# Patient Record
Sex: Female | Born: 1965
Health system: Southern US, Community
[De-identification: ages and names within clinical notes are randomized; demographics above are authoritative.]

## PROBLEM LIST (undated history)

## (undated) DIAGNOSIS — M2550 Pain in unspecified joint: Secondary | ICD-10-CM

## (undated) DIAGNOSIS — G2581 Restless legs syndrome: Secondary | ICD-10-CM

## (undated) DIAGNOSIS — E559 Vitamin D deficiency, unspecified: Secondary | ICD-10-CM

## (undated) DIAGNOSIS — G43909 Migraine, unspecified, not intractable, without status migrainosus: Secondary | ICD-10-CM

## (undated) DIAGNOSIS — Z973 Presence of spectacles and contact lenses: Secondary | ICD-10-CM

## (undated) DIAGNOSIS — E2839 Other primary ovarian failure: Secondary | ICD-10-CM

## (undated) DIAGNOSIS — E538 Deficiency of other specified B group vitamins: Secondary | ICD-10-CM

## (undated) DIAGNOSIS — M48061 Spinal stenosis, lumbar region without neurogenic claudication: Secondary | ICD-10-CM

## (undated) DIAGNOSIS — M5416 Radiculopathy, lumbar region: Secondary | ICD-10-CM

## (undated) DIAGNOSIS — I499 Cardiac arrhythmia, unspecified: Secondary | ICD-10-CM

## (undated) DIAGNOSIS — Z9889 Other specified postprocedural states: Secondary | ICD-10-CM

## (undated) DIAGNOSIS — Z889 Allergy status to unspecified drugs, medicaments and biological substances status: Secondary | ICD-10-CM

## (undated) DIAGNOSIS — A498 Other bacterial infections of unspecified site: Secondary | ICD-10-CM

## (undated) DIAGNOSIS — I4891 Unspecified atrial fibrillation: Secondary | ICD-10-CM

## (undated) DIAGNOSIS — E876 Hypokalemia: Secondary | ICD-10-CM

## (undated) DIAGNOSIS — I10 Essential (primary) hypertension: Secondary | ICD-10-CM

## (undated) DIAGNOSIS — B019 Varicella without complication: Secondary | ICD-10-CM

## (undated) DIAGNOSIS — Z981 Arthrodesis status: Secondary | ICD-10-CM

## (undated) DIAGNOSIS — T8859XA Other complications of anesthesia, initial encounter: Secondary | ICD-10-CM

## (undated) DIAGNOSIS — E039 Hypothyroidism, unspecified: Secondary | ICD-10-CM

## (undated) DIAGNOSIS — E669 Obesity, unspecified: Secondary | ICD-10-CM

## (undated) DIAGNOSIS — F419 Anxiety disorder, unspecified: Secondary | ICD-10-CM

## (undated) DIAGNOSIS — E785 Hyperlipidemia, unspecified: Secondary | ICD-10-CM

## (undated) DIAGNOSIS — L659 Nonscarring hair loss, unspecified: Secondary | ICD-10-CM

## (undated) DIAGNOSIS — R112 Nausea with vomiting, unspecified: Secondary | ICD-10-CM

## (undated) DIAGNOSIS — F32A Depression, unspecified: Secondary | ICD-10-CM

## (undated) DIAGNOSIS — F329 Major depressive disorder, single episode, unspecified: Secondary | ICD-10-CM

## (undated) DIAGNOSIS — E119 Type 2 diabetes mellitus without complications: Secondary | ICD-10-CM

## (undated) DIAGNOSIS — R7309 Other abnormal glucose: Secondary | ICD-10-CM

## (undated) DIAGNOSIS — S83232A Complex tear of medial meniscus, current injury, left knee, initial encounter: Secondary | ICD-10-CM

## (undated) DIAGNOSIS — S82132A Displaced fracture of medial condyle of left tibia, initial encounter for closed fracture: Secondary | ICD-10-CM

## (undated) DIAGNOSIS — R413 Other amnesia: Secondary | ICD-10-CM

## (undated) DIAGNOSIS — M5136 Other intervertebral disc degeneration, lumbar region: Secondary | ICD-10-CM

## (undated) DIAGNOSIS — Z79890 Hormone replacement therapy (postmenopausal): Secondary | ICD-10-CM

## (undated) DIAGNOSIS — F5104 Psychophysiologic insomnia: Secondary | ICD-10-CM

## (undated) DIAGNOSIS — J45909 Unspecified asthma, uncomplicated: Secondary | ICD-10-CM

## (undated) HISTORY — DX: Psychophysiologic insomnia: F51.04

## (undated) HISTORY — PX: OTHER SURGICAL HISTORY: SHX169

## (undated) HISTORY — DX: Varicella without complication: B01.9

## (undated) HISTORY — DX: Anxiety disorder, unspecified: F41.9

## (undated) HISTORY — DX: Other bacterial infections of unspecified site: A49.8

## (undated) HISTORY — DX: Unspecified asthma, uncomplicated: J45.909

## (undated) HISTORY — DX: Restless legs syndrome: G25.81

## (undated) HISTORY — DX: Depression, unspecified: F32.A

## (undated) HISTORY — DX: Migraine, unspecified, not intractable, without status migrainosus: G43.909

## (undated) HISTORY — DX: Major depressive disorder, single episode, unspecified: F32.9

## (undated) HISTORY — DX: Essential (primary) hypertension: I10

## (undated) HISTORY — DX: Allergy status to unspecified drugs, medicaments and biological substances: Z88.9

## (undated) HISTORY — DX: Obesity, unspecified: E66.9

## (undated) HISTORY — DX: Hypothyroidism, unspecified: E03.9

## (undated) HISTORY — DX: Hyperlipidemia, unspecified: E78.5

## (undated) HISTORY — PX: BREAST BIOPSY: SHX20

---
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
## 1997-09-18 ENCOUNTER — Ambulatory Visit (HOSPITAL_COMMUNITY): Admission: RE | Admit: 1997-09-18 | Discharge: 1997-09-18 | Payer: Self-pay | Admitting: Family Medicine

## 1998-01-08 ENCOUNTER — Encounter: Payer: Self-pay | Admitting: Family Medicine

## 1998-01-08 ENCOUNTER — Ambulatory Visit (HOSPITAL_COMMUNITY): Admission: RE | Admit: 1998-01-08 | Discharge: 1998-01-08 | Payer: Self-pay | Admitting: Family Medicine

## 1998-10-04 ENCOUNTER — Emergency Department (HOSPITAL_COMMUNITY): Admission: EM | Admit: 1998-10-04 | Discharge: 1998-10-04 | Payer: Self-pay | Admitting: Emergency Medicine

## 1998-10-04 ENCOUNTER — Encounter: Payer: Self-pay | Admitting: Emergency Medicine

## 1998-11-27 ENCOUNTER — Encounter: Payer: Self-pay | Admitting: Family Medicine

## 1998-11-27 ENCOUNTER — Ambulatory Visit (HOSPITAL_COMMUNITY): Admission: RE | Admit: 1998-11-27 | Discharge: 1998-11-27 | Payer: Self-pay | Admitting: Family Medicine

## 2000-11-18 ENCOUNTER — Other Ambulatory Visit: Admission: RE | Admit: 2000-11-18 | Discharge: 2000-11-18 | Payer: Self-pay | Admitting: Obstetrics and Gynecology

## 2001-03-02 HISTORY — PX: CERVICAL DISCECTOMY: SHX98

## 2001-07-14 ENCOUNTER — Emergency Department (HOSPITAL_COMMUNITY): Admission: EM | Admit: 2001-07-14 | Discharge: 2001-07-14 | Payer: Self-pay

## 2002-06-15 ENCOUNTER — Other Ambulatory Visit: Admission: RE | Admit: 2002-06-15 | Discharge: 2002-06-15 | Payer: Self-pay | Admitting: Obstetrics and Gynecology

## 2003-02-15 ENCOUNTER — Encounter: Admission: RE | Admit: 2003-02-15 | Discharge: 2003-02-15 | Payer: Self-pay | Admitting: Family Medicine

## 2003-05-10 ENCOUNTER — Ambulatory Visit (HOSPITAL_COMMUNITY): Admission: RE | Admit: 2003-05-10 | Discharge: 2003-05-10 | Payer: Self-pay | Admitting: Family Medicine

## 2003-08-20 ENCOUNTER — Emergency Department (HOSPITAL_COMMUNITY): Admission: EM | Admit: 2003-08-20 | Discharge: 2003-08-20 | Payer: Self-pay | Admitting: Emergency Medicine

## 2003-09-01 ENCOUNTER — Ambulatory Visit (HOSPITAL_COMMUNITY): Admission: RE | Admit: 2003-09-01 | Discharge: 2003-09-01 | Payer: Self-pay | Admitting: Family Medicine

## 2003-09-14 ENCOUNTER — Encounter: Admission: RE | Admit: 2003-09-14 | Discharge: 2003-09-14 | Payer: Self-pay | Admitting: Family Medicine

## 2003-09-26 ENCOUNTER — Ambulatory Visit (HOSPITAL_COMMUNITY): Admission: RE | Admit: 2003-09-26 | Discharge: 2003-09-26 | Payer: Self-pay | Admitting: Family Medicine

## 2003-10-11 ENCOUNTER — Other Ambulatory Visit: Admission: RE | Admit: 2003-10-11 | Discharge: 2003-10-11 | Payer: Self-pay | Admitting: Obstetrics and Gynecology

## 2003-11-03 ENCOUNTER — Ambulatory Visit (HOSPITAL_COMMUNITY): Admission: RE | Admit: 2003-11-03 | Discharge: 2003-11-03 | Payer: Self-pay | Admitting: Family Medicine

## 2003-11-15 ENCOUNTER — Ambulatory Visit (HOSPITAL_COMMUNITY): Admission: RE | Admit: 2003-11-15 | Discharge: 2003-11-16 | Payer: Self-pay | Admitting: Neurological Surgery

## 2003-12-17 ENCOUNTER — Encounter: Admission: RE | Admit: 2003-12-17 | Discharge: 2003-12-17 | Payer: Self-pay | Admitting: Neurological Surgery

## 2004-02-28 ENCOUNTER — Encounter: Admission: RE | Admit: 2004-02-28 | Discharge: 2004-02-28 | Payer: Self-pay | Admitting: Neurological Surgery

## 2007-03-03 HISTORY — PX: VAGINAL HYSTERECTOMY: SUR661

## 2007-06-20 ENCOUNTER — Inpatient Hospital Stay (HOSPITAL_COMMUNITY): Admission: AD | Admit: 2007-06-20 | Discharge: 2007-06-20 | Payer: Self-pay | Admitting: Obstetrics and Gynecology

## 2007-06-28 ENCOUNTER — Encounter (INDEPENDENT_AMBULATORY_CARE_PROVIDER_SITE_OTHER): Payer: Self-pay | Admitting: Obstetrics and Gynecology

## 2007-06-28 ENCOUNTER — Inpatient Hospital Stay (HOSPITAL_COMMUNITY): Admission: AD | Admit: 2007-06-28 | Discharge: 2007-06-29 | Payer: Self-pay | Admitting: Obstetrics and Gynecology

## 2008-02-20 ENCOUNTER — Encounter: Admission: RE | Admit: 2008-02-20 | Discharge: 2008-02-20 | Payer: Self-pay | Admitting: Neurological Surgery

## 2008-03-02 HISTORY — PX: CERVICAL FUSION: SHX112

## 2008-03-08 ENCOUNTER — Ambulatory Visit (HOSPITAL_COMMUNITY): Admission: RE | Admit: 2008-03-08 | Discharge: 2008-03-08 | Payer: Self-pay | Admitting: Family Medicine

## 2008-03-16 ENCOUNTER — Ambulatory Visit (HOSPITAL_COMMUNITY): Admission: RE | Admit: 2008-03-16 | Discharge: 2008-03-17 | Payer: Self-pay | Admitting: Neurological Surgery

## 2008-04-10 ENCOUNTER — Encounter: Admission: RE | Admit: 2008-04-10 | Discharge: 2008-04-10 | Payer: Self-pay | Admitting: Neurological Surgery

## 2008-06-11 ENCOUNTER — Encounter: Admission: RE | Admit: 2008-06-11 | Discharge: 2008-06-11 | Payer: Self-pay | Admitting: Neurological Surgery

## 2008-06-22 ENCOUNTER — Encounter: Admission: RE | Admit: 2008-06-22 | Discharge: 2008-06-22 | Payer: Self-pay | Admitting: Family Medicine

## 2008-09-10 ENCOUNTER — Encounter: Admission: RE | Admit: 2008-09-10 | Discharge: 2008-09-10 | Payer: Self-pay | Admitting: Neurological Surgery

## 2009-02-08 ENCOUNTER — Inpatient Hospital Stay (HOSPITAL_COMMUNITY): Admission: EM | Admit: 2009-02-08 | Discharge: 2009-02-09 | Payer: Self-pay | Admitting: Emergency Medicine

## 2009-08-23 ENCOUNTER — Encounter: Admission: RE | Admit: 2009-08-23 | Discharge: 2009-08-23 | Payer: Self-pay | Admitting: Family Medicine

## 2010-01-06 ENCOUNTER — Encounter: Admission: RE | Admit: 2010-01-06 | Discharge: 2010-01-06 | Payer: Self-pay | Admitting: Obstetrics and Gynecology

## 2010-03-14 ENCOUNTER — Encounter
Admission: RE | Admit: 2010-03-14 | Discharge: 2010-03-14 | Payer: Self-pay | Source: Home / Self Care | Attending: Orthopaedic Surgery | Admitting: Orthopaedic Surgery

## 2010-06-03 LAB — DIFFERENTIAL
Basophils Absolute: 0 10*3/uL (ref 0.0–0.1)
Basophils Absolute: 0.1 10*3/uL (ref 0.0–0.1)
Basophils Relative: 0 % (ref 0–1)
Basophils Relative: 1 % (ref 0–1)
Eosinophils Absolute: 0.1 10*3/uL (ref 0.0–0.7)
Eosinophils Absolute: 0.2 10*3/uL (ref 0.0–0.7)
Eosinophils Relative: 1 % (ref 0–5)
Eosinophils Relative: 1 % (ref 0–5)
Lymphocytes Relative: 25 % (ref 12–46)
Lymphocytes Relative: 37 % (ref 12–46)
Lymphs Abs: 2.7 10*3/uL (ref 0.7–4.0)
Lymphs Abs: 3.1 10*3/uL (ref 0.7–4.0)
Monocytes Absolute: 0.5 10*3/uL (ref 0.1–1.0)
Monocytes Absolute: 0.8 10*3/uL (ref 0.1–1.0)
Monocytes Relative: 7 % (ref 3–12)
Monocytes Relative: 7 % (ref 3–12)
Neutro Abs: 4.1 10*3/uL (ref 1.7–7.7)
Neutro Abs: 8.2 10*3/uL — ABNORMAL HIGH (ref 1.7–7.7)
Neutrophils Relative %: 55 % (ref 43–77)
Neutrophils Relative %: 66 % (ref 43–77)

## 2010-06-03 LAB — POCT CARDIAC MARKERS
CKMB, poc: 3.1 ng/mL (ref 1.0–8.0)
CKMB, poc: <1 ng/mL — ABNORMAL LOW (ref 1.0–8.0)
Myoglobin, poc: 71.8 ng/mL (ref 12–200)
Myoglobin, poc: 91.3 ng/mL (ref 12–200)
Troponin i, poc: <0.05 ng/mL (ref 0.00–0.09)
Troponin i, poc: <0.05 ng/mL (ref 0.00–0.09)

## 2010-06-03 LAB — URINALYSIS, ROUTINE W REFLEX MICROSCOPIC
Bilirubin Urine: NEGATIVE
Glucose, UA: NEGATIVE mg/dL
Hgb urine dipstick: NEGATIVE
Ketones, ur: NEGATIVE mg/dL
Nitrite: NEGATIVE
Protein, ur: NEGATIVE mg/dL
Specific Gravity, Urine: 1.03 (ref 1.005–1.030)
Urobilinogen, UA: 0.2 mg/dL (ref 0.0–1.0)
pH: 5.5 (ref 5.0–8.0)

## 2010-06-03 LAB — TROPONIN I
Troponin I: 0.01 ng/mL (ref 0.00–0.06)
Troponin I: 0.01 ng/mL (ref 0.00–0.06)
Troponin I: 0.01 ng/mL (ref 0.00–0.06)

## 2010-06-03 LAB — PHOSPHORUS: Phosphorus: 2.7 mg/dL (ref 2.3–4.6)

## 2010-06-03 LAB — CK TOTAL AND CKMB (NOT AT ARMC)
CK, MB: 1.9 ng/mL (ref 0.3–4.0)
CK, MB: 2.6 ng/mL (ref 0.3–4.0)
CK, MB: 2.9 ng/mL (ref 0.3–4.0)
Relative Index: 1.3 (ref 0.0–2.5)
Relative Index: 1.4 (ref 0.0–2.5)
Relative Index: 1.6 (ref 0.0–2.5)
Total CK: 134 U/L (ref 7–177)
Total CK: 160 U/L (ref 7–177)
Total CK: 228 U/L — ABNORMAL HIGH (ref 7–177)

## 2010-06-03 LAB — URINE CULTURE: Colony Count: 75000

## 2010-06-03 LAB — CBC
HCT: 35.2 % — ABNORMAL LOW (ref 36.0–46.0)
HCT: 37.8 % (ref 36.0–46.0)
Hemoglobin: 12.4 g/dL (ref 12.0–15.0)
Hemoglobin: 13.1 g/dL (ref 12.0–15.0)
MCHC: 34.7 g/dL (ref 30.0–36.0)
MCHC: 35.1 g/dL (ref 30.0–36.0)
MCV: 91.5 fL (ref 78.0–100.0)
MCV: 92 fL (ref 78.0–100.0)
Platelets: 260 10*3/uL (ref 150–400)
Platelets: 289 10*3/uL (ref 150–400)
RBC: 3.83 MIL/uL — ABNORMAL LOW (ref 3.87–5.11)
RBC: 4.13 MIL/uL (ref 3.87–5.11)
RDW: 13.1 % (ref 11.5–15.5)
RDW: 13.4 % (ref 11.5–15.5)
WBC: 12.4 10*3/uL — ABNORMAL HIGH (ref 4.0–10.5)
WBC: 7.5 10*3/uL (ref 4.0–10.5)

## 2010-06-03 LAB — POCT I-STAT, CHEM 8
BUN: 12 mg/dL (ref 6–23)
Calcium, Ion: 1.14 mmol/L (ref 1.12–1.32)
Chloride: 105 mEq/L (ref 96–112)
Creatinine, Ser: 0.4 mg/dL (ref 0.4–1.2)
Glucose, Bld: 122 mg/dL — ABNORMAL HIGH (ref 70–99)
HCT: 38 % (ref 36.0–46.0)
Hemoglobin: 12.9 g/dL (ref 12.0–15.0)
Potassium: 3 mEq/L — ABNORMAL LOW (ref 3.5–5.1)
Sodium: 139 mEq/L (ref 135–145)
TCO2: 26 mmol/L (ref 0–100)

## 2010-06-03 LAB — D-DIMER, QUANTITATIVE (NOT AT ARMC): D-Dimer, Quant: <0.22 ug/mL-FEU (ref 0.00–0.48)

## 2010-06-03 LAB — BASIC METABOLIC PANEL
BUN: 13 mg/dL (ref 6–23)
CO2: 25 mEq/L (ref 19–32)
Calcium: 8.2 mg/dL — ABNORMAL LOW (ref 8.4–10.5)
Chloride: 104 mEq/L (ref 96–112)
Creatinine, Ser: 0.48 mg/dL (ref 0.4–1.2)
GFR calc Af Amer: >60 mL/min (ref >60)
GFR calc non Af Amer: >60 mL/min (ref >60)
Glucose, Bld: 99 mg/dL (ref 70–99)
Potassium: 3.5 mEq/L (ref 3.5–5.1)
Sodium: 136 mEq/L (ref 135–145)

## 2010-06-03 LAB — LIPID PANEL
Cholesterol: 143 mg/dL (ref 0–200)
HDL: 35 mg/dL — ABNORMAL LOW (ref >39)
LDL Cholesterol: 59 mg/dL (ref 0–99)
Total CHOL/HDL Ratio: 4.1 RATIO
Triglycerides: 247 mg/dL — ABNORMAL HIGH (ref <150)
VLDL: 49 mg/dL — ABNORMAL HIGH (ref 0–40)

## 2010-06-03 LAB — HEPATIC FUNCTION PANEL
ALT: 27 U/L (ref 0–35)
AST: 21 U/L (ref 0–37)
Albumin: 3.2 g/dL — ABNORMAL LOW (ref 3.5–5.2)
Alkaline Phosphatase: 52 U/L (ref 39–117)
Bilirubin, Direct: 0.1 mg/dL (ref 0.0–0.3)
Indirect Bilirubin: 0.7 mg/dL (ref 0.3–0.9)
Total Bilirubin: 0.8 mg/dL (ref 0.3–1.2)
Total Protein: 6 g/dL (ref 6.0–8.3)

## 2010-06-03 LAB — MAGNESIUM: Magnesium: 1.8 mg/dL (ref 1.5–2.5)

## 2010-06-03 LAB — URINE MICROSCOPIC-ADD ON

## 2010-06-03 LAB — PROTIME-INR
INR: 1.08 (ref 0.00–1.49)
Prothrombin Time: 13.9 seconds (ref 11.6–15.2)

## 2010-06-03 LAB — TSH: TSH: 1.906 u[IU]/mL (ref 0.350–4.500)

## 2010-06-03 LAB — APTT: aPTT: 32 seconds (ref 24–37)

## 2010-06-16 LAB — DIFFERENTIAL
Basophils Absolute: 0.1 10*3/uL (ref 0.0–0.1)
Basophils Relative: 1 % (ref 0–1)
Eosinophils Absolute: 0.1 10*3/uL (ref 0.0–0.7)
Eosinophils Relative: 1 % (ref 0–5)
Lymphocytes Relative: 30 % (ref 12–46)
Lymphs Abs: 2.8 10*3/uL (ref 0.7–4.0)
Monocytes Absolute: 0.6 10*3/uL (ref 0.1–1.0)
Monocytes Relative: 6 % (ref 3–12)
Neutro Abs: 5.9 10*3/uL (ref 1.7–7.7)
Neutrophils Relative %: 62 % (ref 43–77)

## 2010-06-16 LAB — BASIC METABOLIC PANEL
BUN: 18 mg/dL (ref 6–23)
CO2: 27 mEq/L (ref 19–32)
Calcium: 9.7 mg/dL (ref 8.4–10.5)
Chloride: 103 mEq/L (ref 96–112)
Creatinine, Ser: 0.54 mg/dL (ref 0.4–1.2)
GFR calc Af Amer: >60 mL/min (ref >60)
GFR calc non Af Amer: >60 mL/min (ref >60)
Glucose, Bld: 93 mg/dL (ref 70–99)
Potassium: 3.8 mEq/L (ref 3.5–5.1)
Sodium: 140 mEq/L (ref 135–145)

## 2010-06-16 LAB — CBC
HCT: 40.1 % (ref 36.0–46.0)
Hemoglobin: 13.5 g/dL (ref 12.0–15.0)
MCHC: 33.8 g/dL (ref 30.0–36.0)
MCV: 90.8 fL (ref 78.0–100.0)
Platelets: 302 10*3/uL (ref 150–400)
RBC: 4.41 MIL/uL (ref 3.87–5.11)
RDW: 12.3 % (ref 11.5–15.5)
WBC: 9.5 10*3/uL (ref 4.0–10.5)

## 2010-06-16 LAB — PROTIME-INR
INR: 1 (ref 0.00–1.49)
Prothrombin Time: 13.1 seconds (ref 11.6–15.2)

## 2010-06-16 LAB — APTT: aPTT: 29 seconds (ref 24–37)

## 2010-07-15 NOTE — Op Note (Signed)
NAMEBRIANNIA, LABA                ACCOUNT NO.:  [id number]   MEDICAL RECORD NO.:  [id number]          PATIENT TYPE:  OIB   LOCATION:  3534                         FACILITY:  [facility]   PHYSICIAN:  Tia Alert, MD     DATE OF BIRTH:  1965/09/14   DATE OF PROCEDURE:  03/16/2008  DATE OF DISCHARGE:                               OPERATIVE REPORT   PREOPERATIVE DIAGNOSES:  Adjacent level spondylosis at C4-C5 with  rightward disk herniation and right C5 radiculopathy and neck pain.   POSTOPERATIVE DIAGNOSES:  Adjacent level spondylosis at C4-C5 with  rightward disk herniation and right C5 radiculopathy and neck pain.   PROCEDURES:  1. Cervical re-exploration with exploration of fusion, C5-C6, C6-C7      with removal of anterior cervical hardware, C5-C7.  2. Decompressive anterior cervical diskectomy, C4-C5.  3. Anterior cervical arthrodesis, C4-C5 utilizing a 7-mm      corticocancellous allograft.  4. Anterior cervical plating, C4-C5 utilizing a 27-mm Atlantis plate.   SURGEON:  Tia Alert, MD   ASSISTANT:  Kathaleen Maser. Pool, MD   ANESTHESIA:  General endotracheal.   COMPLICATIONS:  None apparent.   INDICATIONS FOR PROCEDURE:  Ms. Reichl is a 45 year old female who  presented with severe neck pain with right shoulder pain.  She had an  MRI which showed cervical spondylosis at C4-C5 with a rightward disk  herniation and spur.   DESCRIPTION OF PROCEDURE:  The patient was taken to operating room where  after induction of adequate generalized endotracheal anesthesia, she was  placed in a supine position on the operating room table.  Her right  anterior cervical region was prepped with DuraPrep and then draped in  the usual sterile fashion.  A 5 mL of local anesthesia was injected and  a transverse incision was made to the right of midline and carried down  to the platysma which was elevated, opened, and undermined with  Metzenbaum scissors.  I then dissected the plane medial to  the  sternocleidomastoid muscle and internal carotid artery and lateral to  the trachea and esophagus to expose C4-C5 in the plate from Z6-X0.  The  soft tissues were removed from the surface of the plate with blunt  dissection and Bovie cautery.  The locking caps were loosened and then  the 6 screws were removed and the plate was removed.  The bleeding was  then controlled with bipolar cautery and wax into the old holes.  I then  went to C4-C5, took down the longus colli muscles after confirming my  level with fluoroscopy, and placed the Shadow-Line retractors under  this.  I incised the disk space with a 15-blade scalpel and then  performed the initial diskectomy with pituitary rongeurs and curved  curettes.  I then used high-speed drill to drill the endplates down to  the level of the posterior longitudinal ligament and posterior spurs.  The posterior longitudinal ligament was opened with a nerve hook under  microscopic dissection and a significant acute disk herniation was found  at C4-C5 on the right side.  Once this was removed,  we undercut the  vertebral bodies at C4-C5 and decompressed the central canal with the 1-  2 mm Kerrison punch and performed bilateral foraminotomies paying  particular attention to the right side where she has the significant  spur disk herniation at right C5 radiculopathy.  C5 nerve was identified  and decompressed distally into its foramen.  We then palpated with a  nerve hook into the midline circumferentially into the foramina to  assure adequate decompression of the central canal on the neural  foramina with both palpation and visualization.  We then irrigated with  saline solution and inspected our decompression once again.  We then  used a 7-mm corticocancellous allograft and tapped this into position at  C4-C5.  We then used a 27-mm plate and placed two 13-mm variable angle  screws into the bodies of C4-C5 and these were locked into the plate by   locking mechanism on the plate.  We then irrigated with saline solution,  dried all bleeding points with bipolar cautery, and once meticulous  hemostasis was achieved, we closed the platysma with 3-0 Vicryl, closed  the subcuticular tissue with 3-0 Vicryl, and closed the skin with  benzoin and Steri-Strips.  The drapes were removed.  Sterile dressing  was applied.  The patient was awakened from general anesthesia and  transferred to the recovery room in stable condition.  At the end of  procedure, all sponge, needle and instrument counts were correct.      Tia Alert, MD  Electronically Signed     DSJ/MEDQ  D:  03/16/2008  T:  03/17/2008  Job:  [id number]

## 2010-07-15 NOTE — Discharge Summary (Signed)
[name] [name], [name] [name]                ACCOUNT NO.:  [id number]   MEDICAL RECORD NO.:  [id number]          PATIENT TYPE:  INP   LOCATION:  9316                          FACILITY[facility]  [facility]   PHYSICIAN:  Randye Lobo, M.D.   DATE OF BIRTH:  06-17-1965   DATE OF ADMISSION:  06/28/2007  DATE OF DISCHARGE:  06/29/2007                               DISCHARGE SUMMARY   ADMISSION DIAGNOSIS:  Symptomatic uterine fibroid.   DISCHARGE DIAGNOSES:  1. Symptomatic uterine fibroid.  2. Bilateral shoulder pain.   SIGNIFICANT OPERATIONS AND PROCEDURES:  The patient underwent a  laparoscopically-assisted vaginal hysterectomy and cystoscopy on June 28, 2007, under the direction of Dr. Conley Simmonds and with the assistance  of Gretchen Short, PA-C, at the [facility] [place].   ADMISSION HISTORY AND PHYSICAL EXAMINATION:  The patient is a 45-year-  old gravida 3, para 2-0-1-2, Caucasian female with pelvic pain and  pressure and a known 7-cm right fundal fibroid.  The patient had recent  removal of her Mirena IUD with no improvement of her symptoms.  The  patient did have a recent treatment of a group B strep urinary tract  infection.  The patient did undergo a CT scan of the abdomen and pelvis  on June 20, 2007, at which time the uterine fibroid was confirmed along  with no other acute abdominal nor pelvic findings.  The patient was  treated with an empiric course of antibiotic therapy which did not  significantly improve her pain and discomfort.  She had no evidence of  pelvic inflammatory disease.   The patient's medical history was significant for hypothyroidism,  hypertension, mood swings, and a previous cervical spine surgery.   On physical examination, the patient was noted to have a normal cervix.  The pelvic exam demonstrated a 7-cm right fundal fibroid which was  mildly tender.  No adnexal masses were appreciated separate from the  uterus.   A preoperative urinalysis was unremarkable.   The patient's white blood  cell count upon admission was 9.7, hemoglobin 13.9, and platelets  318,000.  Her glucose was 100.   HOSPITAL COURSE:  The patient was admitted on June 28, 2007, at which  time she underwent a laparoscopically-assisted vaginal hysterectomy and  cystoscopy.  The patient's surgery was remarkable for an estimated blood  loss of 1150 mL.  The patient's surgery was uncomplicated.   Postoperatively, when the patient was in the recovery room, she noted  bilateral shoulder pain.  The patient was assessed by anesthesia who  identified at the discomfort was coming from the [facility] joint and was worse  with movement.  There was no numbness or weakness distal to the  shoulder.  The patient did receive some relief of the shoulder pain with  Versed IV.   The patient was discharged to the [facility].  She received pain  control with a Morphine PCA and Toradol.   At that the patient continued to have shoulder pain on postoperative day  #1.  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  The patient denied any abdominal pain.  The patient was able to  ambulate successfully, and her Foley catheter was therefore removed.  She was able to void spontaneously.  The patient's abdomen remained soft  and nontender, and her dressings and incisions were intact.   The patient's postop day #1 hemoglobin was 10.2, and she was tolerating  this well.  Her glucose level was 147, and this was attributed to the  dextrose in her IV.   An anesthesia consultation was performed on postop day #1 due to the  bilateral shoulder pain and arm pain which seemed to focus more on the  right than on the left.  A neurological exam was performed which was  unremarkable.  The patient was given a diagnosis of pain secondary to  paraspinous muscle spasm secondary to surgical positioning and surgical  length of  time.  The patient was treated with heat therapy.  Eventually,  Dilaudid oral tablets did control the pain adequately.  A physical  therapy consultation was not going to be possible until the following  day, and the patient opted for discharge to home as she was otherwise  tolerating a diet and voiding and ambulating.   DISCHARGE INSTRUCTIONS:  1. Discharged to home.  2. The patient will take the following medications:  Dilaudid 2 mg      p.o. q.4-6 h. p.r.n. pain and ibuprofen 600 mg p.o. q.6 h. p.r.n.      pain.  3. The patient will follow a regular diet.  4. The patient will have decreased activity at home for the next 4-6      weeks.  She will not drive for 2 weeks.  5. The patient will follow up in the office in 4 weeks' time.  6. The patient will call if she experiences any problems with fever,      nausea and vomiting, pain uncontrolled by her medication, heavy      vaginal bleeding, difficulty voiding, increased arm or shoulder      pain, or any other concern.      Randye Lobo, M.D.  Electronically Signed     BES/MEDQ  D:  07/12/2007  T:  07/13/2007  Job:  [id number]

## 2010-07-15 NOTE — Op Note (Signed)
[name] [name]                ACCOUNT NO.:  [id number]   MEDICAL RECORD NO.:  [id number]          PATIENT TYPE:  INP   LOCATION:  9316                          FACILITY:  [facility]   PHYSICIAN:  [name] [name], M.D.   DATE OF BIRTH:  1965/05/31   DATE OF PROCEDURE:  06/28/2007  DATE OF DISCHARGE:                               OPERATIVE REPORT   PREOPERATIVE DIAGNOSIS:  Symptomatic uterine fibroid.   POSTOPERATIVE DIAGNOSIS:  Symptomatic uterine fibroid.   PROCEDURE:  Laparoscopically-assisted vaginal hysterectomy and  cystoscopy.   SURGEON:  Conley Simmonds, M.D.   ASSISTANT:  Jose C. Edward Jolly, PA-C   ANESTHESIA:  General endotracheal, local with both 0.5% lidocaine with  epinephrine 1:200,000 and 0.25% Marcaine.  The IV fluids 4500 mL  Ringer's lactate.   ESTIMATED BLOOD LOSS:  1150 mL   URINE OUTPUT:  650 mL.   COMPLICATIONS:  None.   INDICATIONS FOR PROCEDURE:  The patient is a 42-year gravida 3, para 2-0-  1-2 Caucasian female who presented to the office reporting pelvic  pressure and pain.  The patient had a Merina IUD that was almost due for  removal.  The patient had seen her primary care [name] [name] Dr. Laurann Montana who had ordered a pelvic ultrasound in [facility].  The ultrasound returned documenting a 7 cm right fundal uterine fibroid.  The patient was treated by her primary care physician for urinary tract  infection which was a group B strep organism and she was on a course of  penicillin where she presented for evaluation.  The patient subsequently  had her Maxie Better IUD removed.  She had no evidence of pelvic inflammatory  disease at that time.  She did have negative gonorrhea and Chlamydia  cultures.  The patient went on to have continued pain and she presented  to the [facility] for evaluation on June 20, 2007,  at which time she was noted to have an elevated temperature and  leukocytosis.  She had an abdominopelvic CT scan which was  remarkable  for only a 6-cm uterine fibroid.  There was no evidence of  nephrolithiasis, cholelithiasis, or appendicitis.  There was no evidence  of infarction of the uterine fibroid.  The patient was empirically  treated with a course of clindamycin antibiotics.  The patient had a  temporary improvement of her pain and discomfort.  Her urinary tract  infection did resolve.  However, the patient continued to have pressure  and discomfort, and she reported she felt like she had a brick in her  pelvis.  She reported urinary frequency and she requested definitive  surgical treatment of the uterine fibroid.  She had completed her  childbearing and declined medical therapy or interventional radiology.  A plan is made now to proceed with a laparoscopic supracervical  hysterectomy with a possible total abdominal hysterectomy after risks,  benefits, and alternatives are reviewed.   FINDINGS:  The laparoscopy demonstrated a 13-week size globally enlarged  uterus.  The uterus also demonstrated small subserosal fibroids.  The  fallopian tubes and the ovaries were unremarkable.  The appendix and  liver were unremarkable.  There was no evidence of any adhesive disease  in the abdomen or the pelvis.  There was no definitive signs of  endometriosis.   Cystoscopy at termination of the hysterectomy documented the ureters to  be patent bilaterally after the injection of indigo carmine dye IV.  There was no evidence of cystotomy or foreign body in the bladder or the  urethra.  There appeared to be some exudate of the bladder trigone.  The  bladder was visualized throughout 360 degrees.   SPECIMENS:  The uterus and cervix were sent to pathology together with  the fragments of the uterine myoma.   DESCRIPTION PROCEDURE:  The patient was reidentified in the preoperative  hold area.  She received Ancef 1 gram IV for antibiotic prophylaxis.  The patient received TED hose and PAS stockings for DVT  prophylaxis.   In the operating room, general endotracheal anesthesia was induced.  The  abdomen and pelvis were sterilely prepped.  A Foley catheter was  sterilely placed inside the bladder.  The patient was then sterilely  draped.   A speculum was placed in the vagina and a single-tooth tenaculum was  placed on the anterior cervical lip.  This was replaced with a Hulka  tenaculum.   Attention was then turned to the abdomen were 1-cm umbilical incision  was created sharply with a scalpel.  Initially, an attempt was made to  place a 10-mm trocar directly into the peritoneal cavity, however, this  was difficult due to the body habitus of the anterior abdominal wall.  The fascia could not be traversed easily and therefore a technique using  a Veress needle was performed and a saline drop test confirmed entry  into the peritoneal cavity.  The CO2 pneumoperitoneum was then achieved.  The Veress needle was removed and a 10-mm trocar was inserted into the  perineal cavity without difficulty.  The laparoscope confirmed proper  placement.   The patient was placed in Trendelenburg position.  A 5 mm right and left  lower quadrant incisions were created with a scalpel.  A 5-mm trocars  were inserted into the peritoneal cavity under direct visualization of  the laparoscope.  An inspection of the pelvic and abdominal organs was  performed and the findings were as noted above.  Due to the global  enlargement of the uterine cavity and the extent to which the fibroid  appeared to encroach on the lower uterine segment.  A plan was made to  proceed with a laparoscopically-assisted vaginal hysterectomy.   The gyrus instrument was used for the laparoscopic portion of the  procedure.  The gyrus was used to grasp the left fallopian tube and  cauterize and cut the tube.  The round ligament was similarly grasped,  cauterized, and bisected.  This allowed access to the left utero-ovarian  ligament which was  similarly grasped, cauterized well and then bisected  with the gyrus instrument.  The dissection continued along to the  anterior leaf of the broad ligament on the ipsilateral side using the  same instrument.  The same procedure that was performed on the left-hand  side was then repeated on the right-hand side.  The dissection was  carried down to the level of the uterine artery on the patient's right-  hand side.  A branch of the uterine artery was cauterized after the  bladder was confirmed to be away from this pedicle.   Hemostasis was good at this time  and the remainder of the procedure was  therefore performed vaginally.   The patient was placed in the high lithotomy position.  A weighted  speculum was placed inside the vagina.  The cervix was injected  circumferentially with 0.5% lidocaine with 1:200,000 of epinephrine.  The cervix was then circumscribed with a scalpel.  Sharp entry into the  posterior cul-de-sac was performed with the a Mayo scissors and digital  exam confirmed proper entry into this location.  A long weighted  speculum was then placed in the posterior cul-de-sac.   There appeared to be several arterioles of the posterior vaginal cuff  which were bleeding significantly and this required several figure of 8  sutures of the posterior cuff to control the arterial bleeding.   Attention was then turned to the uterosacral ligaments bilaterally.  These were clamped, sharply divided, and suture ligated with 0-Vicryl  bilaterally.  It was not possible to enter the anterior cul-de-sac  initially due to the very long length of the cervix and due to the  position of the intramural fibroid.  The bladder was dissected away from  the cervix and the anterior lower uterine segment, and several bites  along the upper aspects of the uterosacral ligaments and the inferior  aspects of the cardinal ligaments were required to control bleeding from  the collateral blood supply due to  the uterine fibroid.  These pedicles  were clamped in a standard fashion, sharply divided, and suture ligated  with 0-Vicryl bilaterally.   There was continued oozing from the posterior cervix in the midline even  with the continued control of the blood supply along the cardinal  ligaments.  A figure-of-eight suture of 0-Vicryl was therefore placed  directly into the posterior cervix in the midline and this was able to  control a continued oozing in this region.   Dissection continued anteriorly to assure that the bladder was away from  the lower uterine segment and the cervix.  The remainder of the cardinal  ligaments were then clamped, sharply divided, and suture ligated with 0-  Vicryl bilaterally.   Due to the difficulty to enter the anterior cul-de-sac, due to the large  fibroid, a decision was made to bisect the cervix in the midline in  order to gain access to the fibroid which was located immediately at the  top of the cervix.  The fibroid was grasped with a tenaculum and the  myoma was wedged out in pieces.  A combination of sharp and blunt  dissection was also used to separate the myoma from the surrounding  myometrium.  This was accomplished in entirety.  This allowed access  into the vesicouterine fold and then the remaining pedicles which were  essentially at the inferior most aspects of the broad ligaments  bilaterally.  Heaney clamps were placed across each of these pedicles  bilaterally.  The specimen was sharply excised.  Heaney sutures of 0-  Vicryl were placed along these pedicles.   Hemostasis was noted to be excellent at this time.   The specimen was sent to pathology.   A moistened lap pad was placed partially inside the peritoneal cavity  and the pedicle sites were reexamined.  There continued to be some  bleeding along the vaginal cuff this time on the left-hand side and  again figure-of-eight sutures of 0-Vicryl were placed to create good  hemostasis.    The cystoscopy was performed next after the Foley catheter was removed.  The cystoscopy demonstrated normal findings.  The bladder was then  drained of the cystoscopy fluid and a sterile Foley catheter was placed  inside the bladder.   With the pedicle sites hemostatic, the vaginal cuff was then closed  using a figure-of-eight sutures of 0-Vicryl.   Final laparoscopy was performed after a CO2 pneumoperitoneum was  reachieved.  The pelvis was irrigated and suctioned and all of the  pedicle sites were examined and found to be hemostatic.  The procedure  was therefore concluded.  The 5-mm trocars were removed under  visualization of the laparoscope.  The pneumoperitoneum was released in  the umbilical trocar and the laparoscope were removed simultaneously.   The umbilical incision was closed with subcuticular sutures of 2-0 plain  gut suture.  The lower abdominal incisions were closed with Dermabond.  All of the skin incisions were injected locally with 0.25% Marcaine.  A  sterile bandage was placed over the umbilical incision.   The patient was cleansed of any remaining Betadine.  She was awakened,  extubated, and escorted to the recovery room in stable and awake  condition.  There were no complications to the procedure.  All needle,  instrument, and sponge counts were correct.      [name] [name], M.D.  Electronically Signed     BES/MEDQ  D:  06/28/2007  T:  06/29/2007  Job:  [id number]

## 2010-07-18 NOTE — Op Note (Signed)
NAME:  [name] [name], [name] [name]                          ACCOUNT NO.:  [id number]   MEDICAL RECORD NO.:  [id number]                   PATIENT TYPE:  OIB   LOCATION:  3036                                 FACILITY:  [facility]   PHYSICIAN:  Tia Alert, MD                  DATE OF BIRTH:  [birth year]   DATE OF PROCEDURE:  11/15/2003  DATE OF DISCHARGE:                                 OPERATIVE REPORT   PREOPERATIVE DIAGNOSIS:  Cervical spondylosis with stenosis, C5-6 with  cervical disk herniation, C6-7 on the left.   POSTOPERATIVE DIAGNOSIS:  Cervical spondylosis with stenosis, C5-6, with  cervical disk herniation, C6-7, on the left.   OPERATION/PROCEDURE:  1.  Decompressive anterior cervical diskectomy at C5-6 and C6-7.  2.  Anterior cervical arthrodesis, C5-6 and C6-7, utilizing 6 mm Tutogen      allografts.  3.  Anterior cervical plating C5 to C7 inclusive utilizing a 42.5 mm Lantus      vision plate.   SURGEON:  Tia Alert, M.D.   ASSISTANT:  Reinaldo Meeker, M.D.   ANESTHESIA:  General endotracheal anesthesia.   COMPLICATIONS:  None.   INDICATIONS:  [name] [name] is a 45 year old white female who is referred to  the neurosurgical clinic with complaints of neck pain with right arm  numbness and pain.  She had an MRI which showed severe cervical spondylosis  with stenosis at C5-6 with a large disk herniation at C6-7 on the left.  I  recommended a two-level anterior cervical diskectomy fusion with plating.  She understood the risks, benefits and alternatives and wished to proceed.   DESCRIPTION OF PROCEDURE:  The patient was taken to the operating room and  after the induction of adequate generalized endotracheal anesthesia, she was  placed in the supine position on the operating table and the right anterior  cervical region was prepped with DuraPrep and draped in the usual sterile  fashion and 3 mL of local anesthesia was injected and then an incision was  made to the right of  midline and carried down to the platysma.  The platysma  was elevated, opened and undermined with Metzenbaum scissors.  I then  dissected in the plane medial to sternocleidomastoid muscle, internal  carotid artery and lateral to the trachea and esophagus to expose the  anterior cervical spine at C5-6 and C6-7.  Internal operative fluoroscopy  confirmed my level and then the disk space was incised to C5-6 and C6-7 and  the initial diskectomy was done with Angelique Blonder curets  and pituitary rongeurs.  We then used the high-speed, air-powered Black Max drill to drill the end  plates for later arthrodesis.  We drilled down to the level of the PLL which  was opened using microscopic dissection and removed in a circumferential  fashion along with the posterior osteophytes.  The C5-6 and C6-7 bilateral  foraminotomies were performed and nerve  roots were identified at each level.  Once the decompression was complete, we dried the operative bed with  Surgifoam and irrigation.  We then measured the interspaces to be 6 mm and  tapped two 6 mm Tutogen allografts into the interspaces at C5-6 and C6-7.  We then used a 42.5 mm Atlantis Vision plate and placed two 13 mm variable  angle screws into the body of C5, two in the body of C6 and two  in the body  of C7, and then locked these into position with a locking mechanism.  We  then irrigated with saline solution, removed the retractor, dried all  bleeding points, checked our construct under fluoroscopy where meticulous  hemostasis was achieved.  We closed the platysma with interrupted 3-0  Vicryl, closed the subcuticular tissue with interrupted 3-0 Vicryl and  closed the skin with Dermabond.  The drapes were removed.  The patient was  awakened from general anesthesia and transported to the recovery room in  stable condition.  That ended the procedure.  All sponge, needle and  instruments counts were correct.      DSJ/MEDQ  D:  11/15/2003  T:  11/16/2003   Job:  [id number]

## 2010-08-11 ENCOUNTER — Other Ambulatory Visit: Payer: Self-pay | Admitting: Family Medicine

## 2010-08-11 ENCOUNTER — Ambulatory Visit
Admission: RE | Admit: 2010-08-11 | Discharge: 2010-08-11 | Disposition: A | Discharge status: Home / Self Care | Payer: PRIVATE HEALTH INSURANCE | Source: Ambulatory Visit | Attending: Family Medicine | Admitting: Family Medicine

## 2010-08-11 DIAGNOSIS — N200 Calculus of kidney: Secondary | ICD-10-CM

## 2010-08-11 DIAGNOSIS — R52 Pain, unspecified: Secondary | ICD-10-CM

## 2010-08-19 ENCOUNTER — Other Ambulatory Visit: Payer: Self-pay | Admitting: Family Medicine

## 2010-08-19 DIAGNOSIS — M545 Low back pain, unspecified: Secondary | ICD-10-CM

## 2010-08-23 ENCOUNTER — Ambulatory Visit
Admission: RE | Admit: 2010-08-23 | Discharge: 2010-08-23 | Disposition: A | Discharge status: Home / Self Care | Payer: PRIVATE HEALTH INSURANCE | Source: Ambulatory Visit | Attending: Family Medicine | Admitting: Family Medicine

## 2010-08-23 DIAGNOSIS — M545 Low back pain, unspecified: Secondary | ICD-10-CM

## 2010-09-05 ENCOUNTER — Ambulatory Visit: Payer: PRIVATE HEALTH INSURANCE | Attending: Neurological Surgery

## 2010-09-05 DIAGNOSIS — IMO0001 Reserved for inherently not codable concepts without codable children: Secondary | ICD-10-CM | POA: Insufficient documentation

## 2010-09-05 DIAGNOSIS — R293 Abnormal posture: Secondary | ICD-10-CM | POA: Insufficient documentation

## 2010-09-05 DIAGNOSIS — M545 Low back pain, unspecified: Secondary | ICD-10-CM | POA: Insufficient documentation

## 2010-09-05 DIAGNOSIS — M6281 Muscle weakness (generalized): Secondary | ICD-10-CM | POA: Insufficient documentation

## 2010-09-08 ENCOUNTER — Ambulatory Visit: Payer: PRIVATE HEALTH INSURANCE | Admitting: Physical Therapy

## 2010-09-10 ENCOUNTER — Ambulatory Visit: Payer: PRIVATE HEALTH INSURANCE | Admitting: Physical Therapy

## 2010-09-10 ENCOUNTER — Encounter: Payer: PRIVATE HEALTH INSURANCE | Admitting: Physical Therapy

## 2010-09-16 ENCOUNTER — Ambulatory Visit: Payer: PRIVATE HEALTH INSURANCE | Admitting: Physical Therapy

## 2010-09-25 ENCOUNTER — Ambulatory Visit: Payer: PRIVATE HEALTH INSURANCE

## 2010-09-30 ENCOUNTER — Ambulatory Visit: Payer: PRIVATE HEALTH INSURANCE | Admitting: Physical Therapy

## 2010-10-07 ENCOUNTER — Encounter: Payer: PRIVATE HEALTH INSURANCE | Admitting: Physical Therapy

## 2010-10-08 ENCOUNTER — Ambulatory Visit: Payer: PRIVATE HEALTH INSURANCE | Attending: Neurological Surgery

## 2010-10-08 DIAGNOSIS — M545 Low back pain, unspecified: Secondary | ICD-10-CM | POA: Insufficient documentation

## 2010-10-08 DIAGNOSIS — IMO0001 Reserved for inherently not codable concepts without codable children: Secondary | ICD-10-CM | POA: Insufficient documentation

## 2010-10-08 DIAGNOSIS — M6281 Muscle weakness (generalized): Secondary | ICD-10-CM | POA: Insufficient documentation

## 2010-10-08 DIAGNOSIS — R293 Abnormal posture: Secondary | ICD-10-CM | POA: Insufficient documentation

## 2010-10-15 ENCOUNTER — Encounter: Payer: PRIVATE HEALTH INSURANCE | Admitting: Physical Therapy

## 2010-11-11 ENCOUNTER — Encounter (HOSPITAL_COMMUNITY)
Admission: RE | Admit: 2010-11-11 | Discharge: 2010-11-11 | Disposition: A | Discharge status: Home / Self Care | Payer: PRIVATE HEALTH INSURANCE | Source: Ambulatory Visit | Attending: Neurological Surgery | Admitting: Neurological Surgery

## 2010-11-11 LAB — CBC
HCT: 40.2 % (ref 36.0–46.0)
Hemoglobin: 13.7 g/dL (ref 12.0–15.0)
MCH: 30.3 pg (ref 26.0–34.0)
MCHC: 34.1 g/dL (ref 30.0–36.0)
MCV: 88.9 fL (ref 78.0–100.0)
Platelets: 289 10*3/uL (ref 150–400)
RBC: 4.52 MIL/uL (ref 3.87–5.11)
RDW: 12.8 % (ref 11.5–15.5)
WBC: 8.6 10*3/uL (ref 4.0–10.5)

## 2010-11-11 LAB — TYPE AND SCREEN
ABO/RH(D): A POS
Antibody Screen: NEGATIVE

## 2010-11-11 LAB — DIFFERENTIAL
Basophils Absolute: 0 10*3/uL (ref 0.0–0.1)
Basophils Relative: 0 % (ref 0–1)
Eosinophils Absolute: 0.1 10*3/uL (ref 0.0–0.7)
Eosinophils Relative: 1 % (ref 0–5)
Lymphocytes Relative: 30 % (ref 12–46)
Lymphs Abs: 2.6 10*3/uL (ref 0.7–4.0)
Monocytes Absolute: 0.6 10*3/uL (ref 0.1–1.0)
Monocytes Relative: 7 % (ref 3–12)
Neutro Abs: 5.4 10*3/uL (ref 1.7–7.7)
Neutrophils Relative %: 63 % (ref 43–77)

## 2010-11-11 LAB — BASIC METABOLIC PANEL
BUN: 15 mg/dL (ref 6–23)
CO2: 29 mEq/L (ref 19–32)
Calcium: 9.9 mg/dL (ref 8.4–10.5)
Chloride: 102 mEq/L (ref 96–112)
Creatinine, Ser: 0.61 mg/dL (ref 0.50–1.10)
GFR calc Af Amer: >60 mL/min (ref >60)
GFR calc non Af Amer: >60 mL/min (ref >60)
Glucose, Bld: 110 mg/dL — ABNORMAL HIGH (ref 70–99)
Potassium: 3.8 mEq/L (ref 3.5–5.1)
Sodium: 140 mEq/L (ref 135–145)

## 2010-11-11 LAB — SURGICAL PCR SCREEN
MRSA, PCR: NEGATIVE
Staphylococcus aureus: NEGATIVE

## 2010-11-11 LAB — PROTIME-INR
INR: 1.02 (ref 0.00–1.49)
Prothrombin Time: 13.6 seconds (ref 11.6–15.2)

## 2010-11-11 LAB — ABO/RH: ABO/RH(D): A POS

## 2010-11-11 LAB — APTT: aPTT: 29 seconds (ref 24–37)

## 2010-11-14 ENCOUNTER — Ambulatory Visit (HOSPITAL_COMMUNITY)
Admission: RE | Admit: 2010-11-14 | Discharge: 2010-11-14 | Disposition: A | Discharge status: Home / Self Care | Payer: PRIVATE HEALTH INSURANCE | Source: Ambulatory Visit | Attending: Neurological Surgery | Admitting: Neurological Surgery

## 2010-11-14 ENCOUNTER — Inpatient Hospital Stay (HOSPITAL_COMMUNITY): Payer: PRIVATE HEALTH INSURANCE

## 2010-11-14 ENCOUNTER — Inpatient Hospital Stay (HOSPITAL_COMMUNITY)
Admission: RE | Admit: 2010-11-14 | Discharge: 2010-11-16 | DRG: 460 | Disposition: A | Discharge status: Home / Self Care | Payer: PRIVATE HEALTH INSURANCE | Source: Ambulatory Visit | Attending: Neurological Surgery | Admitting: Neurological Surgery

## 2010-11-14 ENCOUNTER — Other Ambulatory Visit (HOSPITAL_COMMUNITY): Payer: Self-pay | Admitting: Neurological Surgery

## 2010-11-14 DIAGNOSIS — M48061 Spinal stenosis, lumbar region without neurogenic claudication: Secondary | ICD-10-CM

## 2010-11-14 DIAGNOSIS — Z01812 Encounter for preprocedural laboratory examination: Secondary | ICD-10-CM

## 2010-11-14 DIAGNOSIS — E039 Hypothyroidism, unspecified: Secondary | ICD-10-CM | POA: Diagnosis present

## 2010-11-14 DIAGNOSIS — M5137 Other intervertebral disc degeneration, lumbosacral region: Principal | ICD-10-CM | POA: Diagnosis present

## 2010-11-14 DIAGNOSIS — M51379 Other intervertebral disc degeneration, lumbosacral region without mention of lumbar back pain or lower extremity pain: Principal | ICD-10-CM | POA: Diagnosis present

## 2010-11-14 DIAGNOSIS — Z01810 Encounter for preprocedural cardiovascular examination: Secondary | ICD-10-CM

## 2010-11-14 DIAGNOSIS — I10 Essential (primary) hypertension: Secondary | ICD-10-CM | POA: Diagnosis present

## 2010-11-14 DIAGNOSIS — Z01818 Encounter for other preprocedural examination: Secondary | ICD-10-CM

## 2010-11-14 DIAGNOSIS — M5126 Other intervertebral disc displacement, lumbar region: Secondary | ICD-10-CM | POA: Diagnosis present

## 2010-11-14 LAB — URINALYSIS, ROUTINE W REFLEX MICROSCOPIC
Bilirubin Urine: NEGATIVE
Glucose, UA: NEGATIVE mg/dL
Hgb urine dipstick: NEGATIVE
Ketones, ur: NEGATIVE mg/dL
Leukocytes, UA: NEGATIVE
Nitrite: NEGATIVE
Protein, ur: NEGATIVE mg/dL
Specific Gravity, Urine: 1.026 (ref 1.005–1.030)
Urobilinogen, UA: 0.2 mg/dL (ref 0.0–1.0)
pH: 5 (ref 5.0–8.0)

## 2010-11-14 LAB — URINE MICROSCOPIC-ADD ON

## 2010-11-15 LAB — URINE CULTURE
Colony Count: NO GROWTH
Culture  Setup Time: 201209141625
Culture: NO GROWTH

## 2010-11-24 NOTE — Op Note (Signed)
NAMEKAFI, DOTTER                ACCOUNT NO.:  [id number]  MEDICAL RECORD NO.:  [id number]  LOCATION:  XRAY                         FACILITY:  [facility]  PHYSICIAN:  Tia Alert, MD     DATE OF BIRTH:  1965/12/05  DATE OF PROCEDURE:  11/14/2010 DATE OF DISCHARGE:                              OPERATIVE REPORT   PREOPERATIVE DIAGNOSIS:  Severe degenerative disk disease, L2-3 with lumbar disk herniation, spinal stenosis with back and hip pain.  POSTOPERATIVE DIAGNOSIS:  Severe degenerative disk disease, L2-3 with lumbar disk herniation, spinal stenosis with back and hip pain.  PROCEDURE: 1. Posterior interlaminar arthrodesis, L2-3. 2. Anterolateral retroperitoneal interbody fusion, L2-3 utilizing an 8     x 22 x 45 mm PEEK interbody cage packed with morselized allograft. 3. Posterior plating, L2-3 utilizing an affix plate.  SURGEON:  Tia Alert, MD  ASSISTANT:  Kathaleen Maser. Pool, MD  ANESTHESIA:  General endotracheal.  COMPLICATIONS:  None apparent.  INDICATIONS FOR PROCEDURE:  Ms. Savich is a 45 year old female who presented with severe back pain radiating into the hip.  She had an MRI, which showed degenerative disk disease with midline disk protrusion and annular tear at L5-S1 with severe degenerative disk disease at L2-3 with spondylosis and stenosis at that level.  The location of her pain suggested that L2-3 was her main pain generator and indeed it was the worst looking disk in her back, recommended an anterolateral retroperitoneal interbody fusion at that level and hopes of improving her pain syndrome.  She understood the risks, benefits, and inspected outcome and wished to proceed.  DESCRIPTION OF PROCEDURE:  The patient was taken to the operating room and after induction of adequate generalized endotracheal anesthesia, she was placed in the left lateral decubitus position exposing her right lateral side and positioned in the typical XLIF fashion.  She was  taped into position and we used AP and lateral fluoroscopy to finish our positioning.  Her right side was cleaned with Hibiclens and then prepped with DuraPrep and draped in usual sterile fashion, injected local anesthesia and direct lateral incision was made over the L2-3 interspace and an incision was made posterolateral to this.  Blunt finger dissection was used to enter the retroperitoneal space.  I could feel that the psoas musculature, the inside of the iliac crest, and the rib. I could also feel the fatty tissues as well as the anterior face the transverse process.  I swept my finger up to the lateral incision and passed my first dilator using my fingertip down to the psoas muscle.  I used EMG monitoring and lateral fluoroscopy to get this positioned then I got my K-wire into position.  We then used sequential dilation until our final retractor was in place.  We placed our light sources, opened up the retractor somewhat, checked with AP and lateral fluoroscopy then used the ball probe to assure there were no neural structures within the working channel.  I then incised the disk space after getting the intradiskal shim into place.  The diskectomy was done with pituitary rongeurs and scrapers.  I open the opposite annulus with a Cobb curette after release and disk from  the endplates.  Once my diskectomy was completed, I passed my 16-mm paddle across the opposite annulus and checked this with lateral fluoroscopy to assure I was in good position and good trajectory.  We then used an 8-mm trial that seemed to fit the best, and so we used an 8 x 22 mm x 45 mm PEEK interbody cage packed with the knots Osteocel Plus and Actifuse putty and tapped this into position from the lateral approach utilizing AP fluoroscopy.  Once it was in place, we removed our sliders, we checked our final construct with AP and lateral fluoroscopy, irrigated with saline solution containing bacitracin and removed  our retractor.  The wounds were closed with 0 Vicryl in the fascia, 2-0 Vicryl in the subcutaneous tissues, 3-0 Vicryl in the subcuticular tissues, and Dermabond on the skin.  The patient was then repositioned in the prone position on chest rolls and all pressure points were padded.  Lumbar region was prepped with DuraPrep and then draped in usual sterile fashion.  5 mL of local anesthesia was injected.  A small dorsal midline incision made and carried down to the L2-3 interspace.  The paraspinous musculature was taken down to expose the lamina of L2 and L3.  Intraoperative fluoroscopy confirmed my level.  I removed the interspinous ligament.  I drilled the lamina on the right-hand side and placed morselized allograft over this and then placed a medium affix plate.  I then checked this with AP and lateral fluoroscopy.  It seemed being good position and seemed tight.  I then closed the fascia with 0 Vicryl, closed the subcutaneous and subcuticular tissue with 2-0 and 3-0 Vicryl, and closed the skin with Benzoin and Steri-Strips.  The drapes were removed.  A sterile dressing was applied.  The patient was awakened from general anesthesia and transferred to the recovery room in stable condition.  At the end of the procedure, all sponge, needle, and instrument counts were correct.     Tia Alert, MD     DSJ/MEDQ  D:  11/14/2010  T:  11/14/2010  Job:  [id number]  Electronically Signed by Marikay Alar MD on 11/24/2010 01:13:29 PM

## 2010-11-25 LAB — URINALYSIS, ROUTINE W REFLEX MICROSCOPIC
Bilirubin Urine: NEGATIVE
Glucose, UA: NEGATIVE
Glucose, UA: NEGATIVE
Ketones, ur: 15 — AB
Ketones, ur: NEGATIVE
Leukocytes, UA: NEGATIVE
Leukocytes, UA: NEGATIVE
Nitrite: NEGATIVE
Nitrite: NEGATIVE
Protein, ur: NEGATIVE
Protein, ur: NEGATIVE
Specific Gravity, Urine: >1.03 — ABNORMAL HIGH
Specific Gravity, Urine: >1.03 — ABNORMAL HIGH
Urobilinogen, UA: 0.2
Urobilinogen, UA: 0.2
pH: 5
pH: 5.5

## 2010-11-25 LAB — BASIC METABOLIC PANEL
BUN: 7
CO2: 28
Calcium: 8.4
Chloride: 104
Creatinine, Ser: 0.63
GFR calc Af Amer: >60
GFR calc non Af Amer: >60
Glucose, Bld: 147 — ABNORMAL HIGH
Potassium: 4.3
Sodium: 137

## 2010-11-25 LAB — CBC
HCT: 29.1 — ABNORMAL LOW
HCT: 40.2
HCT: 41.8
Hemoglobin: 10.2 — ABNORMAL LOW
Hemoglobin: 13.9
Hemoglobin: 14.6
MCHC: 34.5
MCHC: 34.9
MCHC: 35
MCV: 90.5
MCV: 91
MCV: 91.7
Platelets: 257
Platelets: 318
Platelets: 335
RBC: 3.17 — ABNORMAL LOW
RBC: 4.45
RBC: 4.59
RDW: 12.6
RDW: 12.6
RDW: 12.7
WBC: 13.4 — ABNORMAL HIGH
WBC: 15 — ABNORMAL HIGH
WBC: 9.7

## 2010-11-25 LAB — COMPREHENSIVE METABOLIC PANEL
ALT: 43 — ABNORMAL HIGH
AST: 20
Albumin: 3.7
Alkaline Phosphatase: 62
BUN: 9
CO2: 28
Calcium: 9.4
Chloride: 107
Creatinine, Ser: 0.59
GFR calc Af Amer: >60
GFR calc non Af Amer: >60
Glucose, Bld: 100 — ABNORMAL HIGH
Potassium: 3.5
Sodium: 141
Total Bilirubin: 0.4
Total Protein: 6.6

## 2010-11-25 LAB — DIFFERENTIAL
Basophils Absolute: 0
Basophils Relative: 1
Eosinophils Absolute: 0.1
Eosinophils Relative: 1
Lymphocytes Relative: 26
Lymphs Abs: 2.6
Monocytes Absolute: 0.7
Monocytes Relative: 8
Neutro Abs: 6.3
Neutrophils Relative %: 65

## 2010-11-25 LAB — URINE MICROSCOPIC-ADD ON: WBC, UA: NONE SEEN

## 2010-11-25 LAB — URINE CULTURE
Colony Count: NO GROWTH
Culture: NO GROWTH

## 2010-12-01 ENCOUNTER — Ambulatory Visit
Admission: RE | Admit: 2010-12-01 | Discharge: 2010-12-01 | Disposition: A | Discharge status: Home / Self Care | Payer: PRIVATE HEALTH INSURANCE | Source: Ambulatory Visit | Attending: Neurological Surgery | Admitting: Neurological Surgery

## 2010-12-01 ENCOUNTER — Other Ambulatory Visit: Payer: Self-pay | Admitting: Neurological Surgery

## 2010-12-01 DIAGNOSIS — M545 Low back pain, unspecified: Secondary | ICD-10-CM

## 2010-12-01 DIAGNOSIS — M5137 Other intervertebral disc degeneration, lumbosacral region: Secondary | ICD-10-CM

## 2010-12-01 DIAGNOSIS — M5126 Other intervertebral disc displacement, lumbar region: Secondary | ICD-10-CM

## 2010-12-01 DIAGNOSIS — M48061 Spinal stenosis, lumbar region without neurogenic claudication: Secondary | ICD-10-CM

## 2011-02-16 ENCOUNTER — Ambulatory Visit
Admission: RE | Admit: 2011-02-16 | Discharge: 2011-02-16 | Disposition: A | Discharge status: Home / Self Care | Payer: PRIVATE HEALTH INSURANCE | Source: Ambulatory Visit | Attending: Neurological Surgery | Admitting: Neurological Surgery

## 2011-02-16 ENCOUNTER — Other Ambulatory Visit: Payer: Self-pay | Admitting: Neurological Surgery

## 2011-02-16 DIAGNOSIS — M545 Low back pain, unspecified: Secondary | ICD-10-CM

## 2011-03-17 ENCOUNTER — Ambulatory Visit
Admission: RE | Admit: 2011-03-17 | Discharge: 2011-03-17 | Disposition: A | Discharge status: Home / Self Care | Payer: PRIVATE HEALTH INSURANCE | Source: Ambulatory Visit | Attending: Neurological Surgery | Admitting: Neurological Surgery

## 2011-03-17 ENCOUNTER — Other Ambulatory Visit: Payer: Self-pay | Admitting: Neurological Surgery

## 2011-03-17 DIAGNOSIS — M545 Low back pain, unspecified: Secondary | ICD-10-CM

## 2011-05-11 ENCOUNTER — Other Ambulatory Visit: Payer: Self-pay | Admitting: Obstetrics and Gynecology

## 2011-05-11 DIAGNOSIS — R928 Other abnormal and inconclusive findings on diagnostic imaging of breast: Secondary | ICD-10-CM

## 2011-05-15 ENCOUNTER — Ambulatory Visit
Admission: RE | Admit: 2011-05-15 | Discharge: 2011-05-15 | Disposition: A | Discharge status: Home / Self Care | Payer: PRIVATE HEALTH INSURANCE | Source: Ambulatory Visit | Attending: Obstetrics and Gynecology | Admitting: Obstetrics and Gynecology

## 2011-05-15 DIAGNOSIS — R928 Other abnormal and inconclusive findings on diagnostic imaging of breast: Secondary | ICD-10-CM

## 2011-07-14 ENCOUNTER — Ambulatory Visit
Admission: RE | Admit: 2011-07-14 | Discharge: 2011-07-14 | Disposition: A | Discharge status: Home / Self Care | Payer: PRIVATE HEALTH INSURANCE | Source: Ambulatory Visit | Attending: Neurological Surgery | Admitting: Neurological Surgery

## 2011-07-14 ENCOUNTER — Other Ambulatory Visit: Payer: Self-pay | Admitting: Neurological Surgery

## 2011-07-14 DIAGNOSIS — M545 Low back pain, unspecified: Secondary | ICD-10-CM

## 2011-07-14 DIAGNOSIS — M5126 Other intervertebral disc displacement, lumbar region: Secondary | ICD-10-CM

## 2013-01-11 ENCOUNTER — Encounter: Payer: Self-pay | Admitting: Neurology

## 2013-01-12 ENCOUNTER — Encounter (INDEPENDENT_AMBULATORY_CARE_PROVIDER_SITE_OTHER): Payer: Self-pay

## 2013-01-12 ENCOUNTER — Ambulatory Visit (INDEPENDENT_AMBULATORY_CARE_PROVIDER_SITE_OTHER): Payer: PRIVATE HEALTH INSURANCE | Admitting: Neurology

## 2013-01-12 ENCOUNTER — Encounter: Payer: Self-pay | Admitting: Neurology

## 2013-01-12 VITALS — BP 135/82 | HR 74 | Ht 64.0 in | Wt 239.0 lb

## 2013-01-12 DIAGNOSIS — F419 Anxiety disorder, unspecified: Secondary | ICD-10-CM

## 2013-01-12 DIAGNOSIS — G2581 Restless legs syndrome: Secondary | ICD-10-CM

## 2013-01-12 HISTORY — DX: Restless legs syndrome: G25.81

## 2013-01-12 HISTORY — DX: Anxiety disorder, unspecified: F41.9

## 2013-01-12 MED ORDER — ROTIGOTINE 4 MG/24HR TD PT24: 1.0000 | MEDICATED_PATCH | Freq: Every day | TRANSDERMAL | Status: DC

## 2013-01-12 NOTE — Progress Notes (Signed)
Reason for visit: Restless leg syndrome  [name] [name] is a 47 y.o. female  History of present illness:  [name] [name] is a 47 year old right-handed white female with a history of restless leg syndrome dating back at least 3 years. The patient indicates that the symptoms have gradually worsened over time, and she is having significant difficulty getting to sleep at night. The patient may take several hours to get to sleep. The patient has to get up frequently and walk and pace the floor. The patient indicates that she also has trouble during the day, but the mornings are better than the afternoons or evenings. The patient indicates that she may sit only about 10 minutes before the restless legs symptoms began. The patient has difficulty while driving, and she has to stop frequently and walk. The patient indicates that she has an achy pain in the legs, and the legs will jerk. The patient is beginning to have some issues with the arms as well. The patient has been on ropinirole taking 2 mg at night with some benefit, recently switched to Mirapex at 1 mg at night. This was not effective. The patient took clonazepam at 1 mg at night, but this resulted in too much drowsiness during the day. The patient went off this medication. The patient is sent to this office for an evaluation. An iron panel has been done recently, and the ferritin and serum iron levels were in the low normal range. The patient denies any family history of restless leg syndrome.  Past Medical History  Diagnosis Date  . HTN (hypertension)   . Hypothyroidism   . Depression   . RLS (restless legs syndrome)   . Anxiety 01/12/2013  . Obesity   . Dyslipidemia   . Migraine     Past Surgical History  Procedure Laterality Date  . Breast biopsy      x 2 benign lesion  . Cervical discectomy  2003  . Neck surgery  2010  . Vaginal hysterectomy  2009  . Fusion  2009    L2-L3  . Hand fracture surgery      Family History  Problem  Relation Age of Onset  . Hypertension Father   . Diabetes Father   . Diabetes Brother     Social history:  reports that she has never smoked. She has never used smokeless tobacco. She reports that she does not drink alcohol or use illicit drugs.  Medications:  Current Outpatient Prescriptions on File Prior to Visit  Medication Sig Dispense Refill  . buPROPion (WELLBUTRIN XL) 300 MG 24 hr tablet Take 300 mg by mouth daily.      . DULoxetine (CYMBALTA) 60 MG capsule Take 60 mg by mouth daily.      Marland Kitchen levothyroxine (SYNTHROID, LEVOTHROID) 175 MCG tablet Take 175 mcg by mouth daily before breakfast.      . meloxicam (MOBIC) 15 MG tablet Take 15 mg by mouth daily as needed for pain.      Marland Kitchen olmesartan-hydrochlorothiazide (BENICAR HCT) 20-12.5 MG per tablet Take 1 tablet by mouth daily.      . valACYclovir (VALTREX) 1000 MG tablet Take 1,000 mg by mouth 2 (two) times daily as needed.       No current facility-administered medications on file prior to visit.      Allergies  Allergen Reactions  . Bee Venom Swelling  . Abilify [Aripiprazole] Other (See Comments)    MOOD SWING  . Biaxin [Clarithromycin] Diarrhea  .  Buspar [Buspirone] Diarrhea  . Lisinopril Cough  . Maxzide [Triamterene-Hctz] Rash  . Phentermine Other (See Comments)    MOOD CHANGE   . Seroquel [Quetiapine Fumarate] Other (See Comments)    MOOD SWINGS    ROS:  Out of a complete 14 system review of symptoms, the patient complains only of the following symptoms, and all other reviewed systems are negative.  Trouble swallowing Cough, wheezing Feeling hot, increased thirst Allergies, runny nose Memory loss Depression, decreased energy Sleepiness, snoring, restless legs  Blood pressure 135/82, pulse 74, height 5\' 4"  (1.626 m), weight 239 lb (108.41 kg).  Physical Exam  General: The patient is alert and cooperative at the time of the examination. The patient is markedly obese.  Head: Pupils are equal, round, and  reactive to light. Discs are flat bilaterally.  Neck: The neck is supple, no carotid bruits are noted.  Respiratory: The respiratory examination is clear.  Cardiovascular: The cardiovascular examination reveals a regular rate and rhythm, no obvious murmurs or rubs are noted.  Skin: Extremities are without significant edema.  Neurologic Exam  Mental status: The patient is alert and oriented x 3 at the time of examination.  Cranial nerves: Facial symmetry is present. There is good sensation of the face to pinprick and soft touch bilaterally. The strength of the facial muscles and the muscles to head turning and shoulder shrug are normal bilaterally. Speech is well enunciated, no aphasia or dysarthria is noted. Extraocular movements are full. Visual fields are full.  Motor: The motor testing reveals 5 over 5 strength of all 4 extremities. Good symmetric motor tone is noted throughout.  Sensory: Sensory testing is intact to pinprick, soft touch, vibration sensation, and position sense on all 4 extremities, with the exception that the vibration sensation is decreased on the right foot as compared to the left. No evidence of extinction is noted.  Coordination: Cerebellar testing reveals good finger-nose-finger and heel-to-shin bilaterally.  Gait and station: Gait is normal. Tandem gait is normal. Romberg is negative. No drift is seen.  Reflexes: Deep tendon reflexes are symmetric, but are depressed bilaterally. Toes are downgoing bilaterally.   Assessment/Plan:  1. Restless leg syndrome  The patient has had an iron panel, but the values are in the low normal range. The patient will go on iron supplementation at this point. The patient is having restless leg syndrome symptoms all day, and for this reason, she will be switched to a Neupro patch with 24 hour coverage. The patient be started on a 4 mg patch, but this dose may need to be adjusted. In the future, usage of other medications such as  Neurontin, or Lyrica, a benzodiazepine, or an opiate medication may be considered. The patient will followup in 4 or 5 months. Various antidepressant medications may exacerbate restless leg syndrome, and it is possible that simplifying her antidepressant regimen may help her restless leg syndrome.  Marlan Palau MD 01/12/2013 7:19 PM  [facility] [address] [place], [place] [address]  Phone [phone] Fax [phone]

## 2013-01-12 NOTE — Patient Instructions (Signed)
Restless Legs Syndrome Restless legs syndrome is a movement disorder. It may also be called a sensori-motor disorder.  CAUSES  No one knows what specifically causes restless legs syndrome, but it tends to run in families. It is also more common in people with low iron, in pregnancy, in people who need dialysis, and those with nerve damage (neuropathy).Some medications may make restless legs syndrome worse.Those medications include drugs to treat high blood pressure, some heart conditions, nausea, colds, allergies, and depression. SYMPTOMS Symptoms include uncomfortable sensations in the legs. These leg sensations are worse during periods of inactivity or rest. They are also worse while sitting or lying down. Individuals that have the disorder describe sensations in the legs that feel like:  Pulling.  Drawing.  Crawling.  Worming.  Boring.  Tingling.  Pins and needles.  Prickling.  Pain. The sensations are usually accompanied by an overwhelming urge to move the legs. Sudden muscle jerks may also occur. Movement provides temporary relief from the discomfort. In rare cases, the arms may also be affected. Symptoms may interfere with going to sleep (sleep onset insomnia). Restless legs syndrome may also be related to periodic limb movement disorder (PLMD). PLMD is another more common motor disorder. It also causes interrupted sleep. The symptoms from PLMD usually occur most often when you are awake. TREATMENT  Treatment for restless legs syndrome is symptomatic. This means that the symptoms are treated.   Massage and cold compresses may provide temporary relief.  Walk, stretch, or take a cold or hot bath.  Get regular exercise and a good night's sleep.  Avoid caffeine, alcohol, nicotine, and medications that can make it worse.  Do activities that provide mental stimulation like discussions, needlework, and video games. These may be helpful if you are not able to walk or  stretch. Some medications are effective in relieving the symptoms. However, many of these medications have side effects. Ask your caregiver about medications that may help your symptoms. Correcting iron deficiency may improve symptoms for some patients. Document Released: 02/06/2002 Document Revised: 05/11/2011 Document Reviewed: 05/15/2010 [employer] Patient Information 2014 [employer].  

## 2013-01-17 ENCOUNTER — Ambulatory Visit
Admission: RE | Admit: 2013-01-17 | Discharge: 2013-01-17 | Disposition: A | Discharge status: Home / Self Care | Payer: PRIVATE HEALTH INSURANCE | Source: Ambulatory Visit | Attending: Family Medicine | Admitting: Family Medicine

## 2013-01-17 ENCOUNTER — Other Ambulatory Visit: Payer: Self-pay | Admitting: Family Medicine

## 2013-01-17 DIAGNOSIS — J209 Acute bronchitis, unspecified: Secondary | ICD-10-CM

## 2013-02-17 ENCOUNTER — Ambulatory Visit (HOSPITAL_BASED_OUTPATIENT_CLINIC_OR_DEPARTMENT_OTHER)
Admission: RE | Admit: 2013-02-17 | Payer: PRIVATE HEALTH INSURANCE | Source: Ambulatory Visit | Admitting: Orthopedic Surgery

## 2013-02-17 ENCOUNTER — Encounter (HOSPITAL_BASED_OUTPATIENT_CLINIC_OR_DEPARTMENT_OTHER): Admission: RE | Payer: Self-pay | Source: Ambulatory Visit

## 2013-02-17 SURGERY — SHOULDER ARTHROSCOPY WITH DISTAL CLAVICLE RESECTION
Anesthesia: General | Site: Shoulder | Laterality: Left

## 2013-07-11 ENCOUNTER — Encounter (INDEPENDENT_AMBULATORY_CARE_PROVIDER_SITE_OTHER): Payer: Self-pay

## 2013-07-11 ENCOUNTER — Ambulatory Visit (INDEPENDENT_AMBULATORY_CARE_PROVIDER_SITE_OTHER): Payer: 59 | Admitting: Neurology

## 2013-07-11 ENCOUNTER — Encounter: Payer: Self-pay | Admitting: Neurology

## 2013-07-11 VITALS — BP 139/80 | HR 83 | Wt 233.0 lb

## 2013-07-11 DIAGNOSIS — G2581 Restless legs syndrome: Secondary | ICD-10-CM

## 2013-07-11 MED ORDER — GABAPENTIN 300 MG PO CAPS: ORAL_CAPSULE | ORAL | Status: DC

## 2013-07-11 NOTE — Progress Notes (Signed)
Reason for visit: Restless leg syndrome  [name] [name] is an 48 y.o. female  History of present illness:  [name] [name] is a 48 year old right-handed white female with a history of restless leg syndrome. The patient was last seen in November 2014, and she was placed on the Neupro patch. She is on a 4 mg patch, but her symptoms did not abate. Requip was added, and recently was increased from 2 mg at night to a 4 mg daily dose. The patient is now taking both the patch and Requip together. She has not been getting any relief with this medication, getting only 3 or 4 hours of sleep at night. She is having excessive fatigue during the day. She indicates that the restless leg symptoms begin around 3 PM, and continue until about 2 AM. She returns for an evaluation. She is on oral iron supplementation. The patient indicates that she has borderline diabetes.  Past Medical History  Diagnosis Date  . HTN (hypertension)   . Hypothyroidism   . Depression   . RLS (restless legs syndrome)   . Anxiety 01/12/2013  . Obesity   . Dyslipidemia   . Migraine     Past Surgical History  Procedure Laterality Date  . Breast biopsy      x 2 benign lesion  . Cervical discectomy  2003  . Neck surgery  2010  . Vaginal hysterectomy  2009  . Fusion  2009    L2-L3  . Hand fracture surgery      Family History  Problem Relation Age of Onset  . Hypertension Father   . Diabetes Father   . Diabetes Brother     Social history:  reports that she has never smoked. She has never used smokeless tobacco. She reports that she does not drink alcohol or use illicit drugs.    Allergies  Allergen Reactions  . Bee Venom Swelling  . Abilify [Aripiprazole] Other (See Comments)    MOOD SWING  . Biaxin [Clarithromycin] Diarrhea  . Buspar [Buspirone] Diarrhea  . Lisinopril Cough  . Maxzide [Triamterene-Hctz] Rash  . Phentermine Other (See Comments)    MOOD CHANGE   . Seroquel [Quetiapine Fumarate] Other (See  Comments)    MOOD SWINGS    Medications:  Current Outpatient Prescriptions on File Prior to Visit  Medication Sig Dispense Refill  . buPROPion (WELLBUTRIN XL) 300 MG 24 hr tablet Take 300 mg by mouth daily.      . DULoxetine (CYMBALTA) 60 MG capsule Take 60 mg by mouth daily.      Marland Kitchen levothyroxine (SYNTHROID, LEVOTHROID) 175 MCG tablet Take 175 mcg by mouth daily before breakfast.      . meloxicam (MOBIC) 15 MG tablet Take 15 mg by mouth daily as needed for pain.      Marland Kitchen olmesartan-hydrochlorothiazide (BENICAR HCT) 20-12.5 MG per tablet Take 1 tablet by mouth daily.       No current facility-administered medications on file prior to visit.    ROS:  Out of a complete 14 system review of symptoms, the patient complains only of the following symptoms, and all other reviewed systems are negative.  Fatigue Excessive sweating Cold intolerance, heat intolerance Vomiting Restless legs Memory loss, tremors  Blood pressure 139/80, pulse 83, weight 233 lb (105.688 kg).  Physical Exam  General: The patient is alert and cooperative at the time of the examination.  Skin: No significant peripheral edema is noted.   Neurologic Exam  Mental status: The patient is  oriented x 3. Mini-Mental status examination done today shows a total score of 28/30.  Cranial nerves: Facial symmetry is present. Speech is normal, no aphasia or dysarthria is noted. Extraocular movements are full. Visual fields are full.  Motor: The patient has good strength in all 4 extremities.  Sensory examination: Soft touch sensation is symmetric on the face, arms, and legs.  Coordination: The patient has good finger-nose-finger and heel-to-shin bilaterally.  Gait and station: The patient has a normal gait. Tandem gait is normal. Romberg is negative. No drift is seen.  Reflexes: Deep tendon reflexes are symmetric.   Assessment/Plan:  1. Restless leg syndrome  2. Borderline diabetes  The patient will be sent for  serum iron and ferritin levels today. If this is low or in the low normal range, she will be sent for IV iron dextran infusion. She will be taken off of the Neupro patch, and she will go to gabapentin along with the Requip. She will followup in 4 months.  Jill Alexanders MD 07/11/2013 11:05 AM  [facility] [address] [place], [place] [address]  Phone [phone] Fax [phone]

## 2013-07-11 NOTE — Patient Instructions (Signed)
Restless Legs Syndrome Restless legs syndrome is a movement disorder. It may also be called a sensori-motor disorder.  CAUSES  No one knows what specifically causes restless legs syndrome, but it tends to run in families. It is also more common in people with low iron, in pregnancy, in people who need dialysis, and those with nerve damage (neuropathy).Some medications may make restless legs syndrome worse.Those medications include drugs to treat high blood pressure, some heart conditions, nausea, colds, allergies, and depression. SYMPTOMS Symptoms include uncomfortable sensations in the legs. These leg sensations are worse during periods of inactivity or rest. They are also worse while sitting or lying down. Individuals that have the disorder describe sensations in the legs that feel like:  Pulling.  Drawing.  Crawling.  Worming.  Boring.  Tingling.  Pins and needles.  Prickling.  Pain. The sensations are usually accompanied by an overwhelming urge to move the legs. Sudden muscle jerks may also occur. Movement provides temporary relief from the discomfort. In rare cases, the arms may also be affected. Symptoms may interfere with going to sleep (sleep onset insomnia). Restless legs syndrome may also be related to periodic limb movement disorder (PLMD). PLMD is another more common motor disorder. It also causes interrupted sleep. The symptoms from PLMD usually occur most often when you are awake. TREATMENT  Treatment for restless legs syndrome is symptomatic. This means that the symptoms are treated.   Massage and cold compresses may provide temporary relief.  Walk, stretch, or take a cold or hot bath.  Get regular exercise and a good night's sleep.  Avoid caffeine, alcohol, nicotine, and medications that can make it worse.  Do activities that provide mental stimulation like discussions, needlework, and video games. These may be helpful if you are not able to walk or  stretch. Some medications are effective in relieving the symptoms. However, many of these medications have side effects. Ask your caregiver about medications that may help your symptoms. Correcting iron deficiency may improve symptoms for some patients. Document Released: 02/06/2002 Document Revised: 05/11/2011 Document Reviewed: 05/15/2010 [employer] Patient Information 2014 [employer].  

## 2013-07-12 LAB — IRON AND TIBC
Iron Saturation: 17 % (ref 15–55)
Iron: 51 ug/dL (ref 35–155)
TIBC: 297 ug/dL (ref 250–450)
UIBC: 246 ug/dL (ref 150–375)

## 2013-07-12 LAB — FERRITIN: Ferritin: 230 ng/mL — ABNORMAL HIGH (ref 15–150)

## 2013-07-21 ENCOUNTER — Other Ambulatory Visit: Payer: Self-pay | Admitting: Neurology

## 2013-07-31 ENCOUNTER — Encounter: Payer: Self-pay | Admitting: Adult Health

## 2013-08-23 ENCOUNTER — Telehealth: Payer: Self-pay | Admitting: Neurology

## 2013-08-23 NOTE — Telephone Encounter (Signed)
Patient indicates she has stopped taking Gabapentin because it made her feel sick.  She is currently taking Mirapex 1mg  nightly and Requip 4mg ; feels she is doing better with these meds.  She wants to ensure it is okay for her to take these or if something different should be done.  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  Please advise.  Thank you.

## 2013-08-23 NOTE — Telephone Encounter (Signed)
I called patient. The patient had nausea on gabapentin, had to stop the medication. The patient apparently has combined Mirapex and Requip with good improvement, but she has begun having a salty taste over the last 2 weeks. I do not see this as a potential side effect from these medications, but the patient may try stopping the Mirapex to see if the salty taste goes away.

## 2013-08-23 NOTE — Telephone Encounter (Signed)
Patient returning call and stated she's doing better with Requip 4 mg and Mirapex 1 mg.  She's questioning if it's ok to take the dosage she's taking.  Also the only side effect she's experiencing is everything taste like salt.  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  Ok to leave message. [employer]

## 2013-08-23 NOTE — Telephone Encounter (Signed)
Patient stated gabapentin (NEURONTIN) 300 MG capsule are making her very sick.  Please call and advise.

## 2013-08-23 NOTE — Telephone Encounter (Signed)
Patient returning [name] [name]'s call

## 2013-08-23 NOTE — Telephone Encounter (Signed)
Patient is returning call and leaving a message--FYI patient is not taking Neurontin 300mg  because it makes her sick--now she is taking Requip 4mg  and Mirapex 1mg --

## 2013-08-23 NOTE — Telephone Encounter (Signed)
I called the patient back.  Got no answer.  Left message. 

## 2013-08-23 NOTE — Telephone Encounter (Signed)
I called back.  Got no answer.  Left message.  

## 2013-09-05 ENCOUNTER — Telehealth: Payer: Self-pay

## 2013-09-05 MED ORDER — PRAMIPEXOLE DIHYDROCHLORIDE 1 MG PO TABS: 2.0000 mg | ORAL_TABLET | Freq: Every day | ORAL | Status: DC

## 2013-09-05 NOTE — Telephone Encounter (Signed)
[facility] sent a fax saying the patient would like a new Rx for Mirapex with directions of 2mg  nightly (current dose is listed as 1mg  nightly).  I called the patient.  She said she has been taking 2mg  nightly rather than 1mg  nightly along with Requip 4mg , and this dose/drug combination seems to be very beneficial.  She would like to know if the Mirapex Rx could be changed.  Please advise. Thank you.

## 2013-09-05 NOTE — Telephone Encounter (Signed)
The patient apparently is getting benefit taking Mirapex 2 mg at night along with Requip 4 mg. I indicated that this is an unusual combination, but it seems to work for her, I will call in the medication.

## 2013-11-01 ENCOUNTER — Other Ambulatory Visit: Payer: Self-pay | Admitting: Neurology

## 2013-11-13 ENCOUNTER — Ambulatory Visit (INDEPENDENT_AMBULATORY_CARE_PROVIDER_SITE_OTHER): Payer: 59 | Admitting: Adult Health

## 2013-11-13 ENCOUNTER — Encounter: Payer: Self-pay | Admitting: Adult Health

## 2013-11-13 ENCOUNTER — Encounter (INDEPENDENT_AMBULATORY_CARE_PROVIDER_SITE_OTHER): Payer: Self-pay

## 2013-11-13 VITALS — BP 131/85 | HR 82 | Ht 64.0 in | Wt 233.0 lb

## 2013-11-13 DIAGNOSIS — G43909 Migraine, unspecified, not intractable, without status migrainosus: Secondary | ICD-10-CM | POA: Insufficient documentation

## 2013-11-13 DIAGNOSIS — G2581 Restless legs syndrome: Secondary | ICD-10-CM

## 2013-11-13 DIAGNOSIS — R413 Other amnesia: Secondary | ICD-10-CM

## 2013-11-13 HISTORY — DX: Other amnesia: R41.3

## 2013-11-13 MED ORDER — PRAMIPEXOLE DIHYDROCHLORIDE 1 MG PO TABS: 2.0000 mg | ORAL_TABLET | Freq: Every day | ORAL | Status: DC

## 2013-11-13 MED ORDER — ROPINIROLE HCL 4 MG PO TABS: ORAL_TABLET | ORAL | Status: DC

## 2013-11-13 NOTE — Patient Instructions (Signed)
Restless Legs Syndrome Restless legs syndrome is a movement disorder. It may also be called a sensorimotor disorder.  CAUSES  No one knows what specifically causes restless legs syndrome, but it tends to run in families. It is also more common in people with low iron, in pregnancy, in people who need dialysis, and those with nerve damage (neuropathy).Some medications may make restless legs syndrome worse.Those medications include drugs to treat high blood pressure, some heart conditions, nausea, colds, allergies, and depression. SYMPTOMS Symptoms include uncomfortable sensations in the legs. These leg sensations are worse during periods of inactivity or rest. They are also worse while sitting or lying down. Individuals that have the disorder describe sensations in the legs that feel like:  Pulling.  Drawing.  Crawling.  Worming.  Boring.  Tingling.  Pins and needles.  Prickling.  Pain. The sensations are usually accompanied by an overwhelming urge to move the legs. Sudden muscle jerks may also occur. Movement provides temporary relief from the discomfort. In rare cases, the arms may also be affected. Symptoms may interfere with going to sleep (sleep onset insomnia). Restless legs syndrome may also be related to periodic limb movement disorder (PLMD). PLMD is another more common motor disorder. It also causes interrupted sleep. The symptoms from PLMD usually occur most often when you are awake. TREATMENT  Treatment for restless legs syndrome is symptomatic. This means that the symptoms are treated.   Massage and cold compresses may provide temporary relief.  Walk, stretch, or take a cold or hot bath.  Get regular exercise and a good night's sleep.  Avoid caffeine, alcohol, nicotine, and medications that can make it worse.  Do activities that provide mental stimulation like discussions, needlework, and video games. These may be helpful if you are not able to walk or stretch. Some  medications are effective in relieving the symptoms. However, many of these medications have side effects. Ask your caregiver about medications that may help your symptoms. Correcting iron deficiency may improve symptoms for some patients. Document Released: 02/06/2002 Document Revised: 07/03/2013 Document Reviewed: 05/15/2010 ExitCare Patient Information 2015 [employer]. This information is not intended to replace advice given to you by your health care provider. Make sure you discuss any questions you have with your health care provider.  

## 2013-11-13 NOTE — Progress Notes (Signed)
I have read the note, and I agree with the clinical assessment and plan.  [name] [name],[name] [name]   

## 2013-11-13 NOTE — Progress Notes (Signed)
[name] DOB: [birth year]  REASON FOR VISIT: follow up HISTORY FROM: patient  HISTORY OF PRESENT ILLNESS: Ms. Guidotti is a 48 year old female with a history of Restless Leg Syndrome. She returns today for follow-up. She is currently taking Mirapex 2 mg and Requip 4 mg and this is offering her modest relief. She states she has to take them every 24 hours or else she will develop symptoms. She is very satisfies with her current treatment for RLS. She has noticed some swelling in her ankles mostly at the end of the day. She states she her family has noticed some forgetfulness. She will tell them things but then not remember that she told them already. She operates a Teacher, music, she does have some problems with directions but that is normal for her, states that it has been occuring for 15 years. She does do the finances, she has not noticed any big issues although she has made just simple mistakes in the past. She cooks meals regularly without difficulty. She has had a sleep study in the past that was normal.  HISTORY 07/11/13 (CW): 48 year old right-handed white female with a history of restless leg syndrome. The patient was last seen in November 2014, and she was placed on the Neupro patch. She is on a 4 mg patch, but her symptoms did not abate. Requip was added, and recently was increased from 2 mg at night to a 4 mg daily dose. The patient is now taking both the patch and Requip together. She has not been getting any relief with this medication, getting only 3 or 4 hours of sleep at night. She is having excessive fatigue during the day. She indicates that the restless leg symptoms begin around 3 PM, and continue until about 2 AM. She returns for an evaluation. She is on oral iron supplementation. The patient indicates that she has borderline diabetes  REVIEW OF SYSTEMS: Full 14 system review of systems performed and notable only for:  Constitutional: N/A  Eyes: N/A Ear/Nose/Throat:  N/A  Skin: N/A  Cardiovascular: Leg swelling Respiratory: N/A  Gastrointestinal: Vomiting  Genitourinary: N/A Hematology/Lymphatic: N/A  Endocrine: N/A Musculoskeletal:N/A  Allergy/Immunology: N/A  Neurological: Memory loss Psychiatric: N/A Sleep: Restless leg   ALLERGIES: Allergies  Allergen Reactions  . Bee Venom Swelling  . Gabapentin     Nausea  . Abilify [Aripiprazole] Other (See Comments)    MOOD SWING  . Biaxin [Clarithromycin] Diarrhea  . Buspar [Buspirone] Diarrhea  . Lisinopril Cough  . Maxzide [Triamterene-Hctz] Rash  . Phentermine Other (See Comments)    MOOD CHANGE   . Seroquel [Quetiapine Fumarate] Other (See Comments)    MOOD SWINGS    HOME MEDICATIONS: Outpatient Prescriptions Prior to Visit  Medication Sig Dispense Refill  . buPROPion (WELLBUTRIN XL) 300 MG 24 hr tablet Take 300 mg by mouth daily.      . DULoxetine (CYMBALTA) 60 MG capsule Take 60 mg by mouth daily.      Marland Kitchen levothyroxine (SYNTHROID, LEVOTHROID) 175 MCG tablet Take 175 mcg by mouth daily before breakfast.      . olmesartan-hydrochlorothiazide (BENICAR HCT) 20-12.5 MG per tablet Take 1 tablet by mouth daily.      . pramipexole (MIRAPEX) 1 MG tablet Take 2 tablets (2 mg total) by mouth at bedtime.  60 tablet  5  . rOPINIRole (REQUIP) 4 MG tablet TAKE 1 TABLET EYE EVERY DAY AS DIRECTED  30 tablet  3  . meloxicam (MOBIC) 15 MG  tablet Take 15 mg by mouth daily as needed for pain.       No facility-administered medications prior to visit.    PAST MEDICAL HISTORY: Past Medical History  Diagnosis Date  . HTN (hypertension)   . Hypothyroidism   . Depression   . RLS (restless legs syndrome)   . Anxiety 01/12/2013  . Obesity   . Dyslipidemia   . Migraine     PAST SURGICAL HISTORY: Past Surgical History  Procedure Laterality Date  . Breast biopsy      x 2 benign lesion  . Cervical discectomy  2003  . Neck surgery  2010  . Vaginal hysterectomy  2009  . Fusion  2009    L2-L3  .  Hand fracture surgery      FAMILY HISTORY: Family History  Problem Relation Age of Onset  . Hypertension Father   . Diabetes Father   . Diabetes Brother     SOCIAL HISTORY: History   Social History  . Marital Status: Married    Spouse Name: N/A    Number of Children: 2  . Years of Education: COLLEGE   Occupational History  . OWNER    Social History Main Topics  . Smoking status: Never Smoker   . Smokeless tobacco: Never Used  . Alcohol Use: No  . Drug Use: No  . Sexual Activity: Not on file   Other Topics Concern  . Not on file   Social History Narrative   Patient is married with 2 children.   Patient is right handed.   Patient has a college education.   Patient drinks 3 cups daily.      PHYSICAL EXAM  Filed Vitals:   11/13/13 1038  BP: 131/85  Pulse: 82  Height: 5\' 4"  (1.626 m)  Weight: 233 lb (105.688 kg)   Body mass index is 39.97 kg/(m^2).  Generalized: Well developed, in no acute distress   Neurological examination  Mentation: Alert oriented to time, place, history taking. Follows all commands speech and language fluent. MMSE 28/30 Cranial nerve II-XII: Pupils were equal round reactive to light. Extraocular movements were full, visual field were full on confrontational test. Facial sensation and strength were normal. hearing was intact to finger rubbing bilaterally. Uvula tongue midline. Head turning and shoulder shrug  were normal and symmetric. Motor: The motor testing reveals 5 over 5 strength of all 4 extremities. Good symmetric motor tone is noted throughout.  Sensory: Sensory testing is intact to soft touch on all 4 extremities. No evidence of extinction is noted.  Coordination: Cerebellar testing reveals good finger-nose-finger and heel-to-shin bilaterally.  Gait and station: Gait is normal. Tandem gait is normal. Romberg is negative. No drift is seen.  Reflexes: Deep tendon reflexes are symmetric and normal bilaterally.  DIAGNOSTIC DATA (LABS, IMAGING, TESTING) - I reviewed patient records, labs, notes, testing and imaging myself where available.  Lab Results  Component Value Date   WBC 8.6 11/11/2010   HGB 13.7 11/11/2010   HCT 40.2 11/11/2010   MCV 88.9 11/11/2010   PLT 289 11/11/2010      Component Value Date/Time   NA 140 11/11/2010 0956   K 3.8 11/11/2010 0956   CL 102 11/11/2010 0956   CO2 29 11/11/2010 0956   GLUCOSE 110* 11/11/2010 0956   BUN 15 11/11/2010 0956   CREATININE 0.61 11/11/2010 0956   CALCIUM 9.9 11/11/2010 0956   PROT 6.0 02/09/2009 0740   ALBUMIN 3.2* 02/09/2009 0740   AST 21 02/09/2009 0740   ALT 27 02/09/2009 0740   ALKPHOS 52 02/09/2009 0740   BILITOT 0.8 02/09/2009 0740   GFRNONAA >60 11/11/2010 0956   GFRAA >60 11/11/2010 0956   Lab Results  Component Value Date   CHOL  Value: 143        ATP III CLASSIFICATION:  <200     mg/dL   Desirable  200-239  mg/dL   Borderline High  >=240    mg/dL   High        02/09/2009   HDL 35* 02/09/2009   LDLCALC  Value: 59        Total Cholesterol/HDL:CHD Risk Coronary Heart Disease Risk Table                     Men   Women  1/2 Average Risk   3.4   3.3  Average Risk       5.0   4.4  2 X Average Risk   9.6   7.1  3 X Average Risk  23.4   11.0        Use the calculated Patient Ratio above and the CHD Risk Table to determine the patient's CHD Risk.        ATP III CLASSIFICATION (LDL):  <100     mg/dL   Optimal  100-129  mg/dL   Near or Above                    Optimal  130-159  mg/dL   Borderline  160-189  mg/dL   High  >190     mg/dL   Very High 02/09/2009   TRIG 247* 02/09/2009   CHOLHDL 4.1 02/09/2009       ASSESSMENT AND PLAN 48 y.o. year old female  has a past medical history of HTN (hypertension); Hypothyroidism; Depression; RLS (restless  legs syndrome); Anxiety (01/12/2013); Obesity; Dyslipidemia; and Migraine. here with:  1. Restless leg Syndrome 2. Memory disturbance  Her current RLS treatment is working well for her. I will refill the Requip and Mirapex today. She has had some ankle swelling usually noticeable at the end of the day. This could be due to the Mirapex or Requip. If it gets worse or becomes uncomfortable then we may have to consider reducing her dosage. Patient MMSE was 28/30. We will continue to follow her memory over time. Patient should follow-up in 6 months or sooner if needed.   Ward Givens, MSN, NP-C 11/13/2013, 10:42 AM [facility] [address] [place], [place] [address] [phone]  Note: This document was prepared with digital dictation and possible smart phrase technology. Any transcriptional errors that result from this process are unintentional.

## 2013-12-15 ENCOUNTER — Other Ambulatory Visit: Payer: Self-pay

## 2014-03-02 HISTORY — PX: LUMBAR FUSION: SHX111

## 2014-05-03 LAB — HM COLONOSCOPY

## 2014-05-14 ENCOUNTER — Ambulatory Visit: Payer: 59 | Admitting: Adult Health

## 2014-05-21 ENCOUNTER — Ambulatory Visit: Payer: Self-pay | Admitting: Adult Health

## 2014-05-22 ENCOUNTER — Other Ambulatory Visit: Payer: Self-pay | Admitting: Adult Health

## 2014-05-23 ENCOUNTER — Encounter: Payer: Self-pay | Admitting: Adult Health

## 2014-05-23 ENCOUNTER — Ambulatory Visit (INDEPENDENT_AMBULATORY_CARE_PROVIDER_SITE_OTHER): Payer: BLUE CROSS/BLUE SHIELD | Admitting: Adult Health

## 2014-05-23 VITALS — BP 128/80 | HR 72 | Ht 64.0 in | Wt 250.0 lb

## 2014-05-23 DIAGNOSIS — R413 Other amnesia: Secondary | ICD-10-CM | POA: Diagnosis not present

## 2014-05-23 DIAGNOSIS — G2581 Restless legs syndrome: Secondary | ICD-10-CM | POA: Diagnosis not present

## 2014-05-23 DIAGNOSIS — F4322 Adjustment disorder with anxiety: Secondary | ICD-10-CM

## 2014-05-23 MED ORDER — GABAPENTIN ENACARBIL ER 300 MG PO TBCR: 300.0000 mg | EXTENDED_RELEASE_TABLET | Freq: Every day | ORAL | Status: DC

## 2014-05-23 NOTE — Progress Notes (Signed)
I have read the note, and I agree with the clinical assessment and plan.  WILLIS,CHARLES KEITH   

## 2014-05-23 NOTE — Patient Instructions (Signed)
Wean off Mirapex- take 1 tablet for the next 3 days then decrease to 1/2 tablet for 3 days then stop.  Continue Requip for now- in the future this may need to be weaned off as well. Start Gabapentin enacarbil 300 mg at 5PM with food. Gabapentin with requip can make you drowsy   Gabapentin enacarbil extended-release tablets (Horizant) What is this medicine? GABAPENTIN (GA ba pen tin) is used to treat Restless Legs Syndrome and certain types of nerve pain. This medicine may be used for other purposes; ask your health care provider or pharmacist if you have questions. COMMON BRAND NAME(S): Horizant What should I tell my health care provider before I take this medicine? They need to know if you have any of these conditions: -kidney disease -seizures -suicidal thoughts, plans, or attempt; a previous suicide attempt by you or a family member -an unusual or allergic reaction to gabapentin, other medicines, foods, dyes, or preservatives -pregnant or trying to get pregnant -breast-feeding How should I use this medicine? Take this medicine by mouth with a glass of water. Follow the directions on the prescription label. Take this medicine with food. Do not cut, crush, or chew this medicine. Take your medicine at regular intervals. Do not take it more often than directed. Do not stop taking except on your doctor's advice. A special MedGuide will be given to you by the pharmacist with each prescription and refill. Be sure to read this information carefully each time. Talk to your pediatrician regarding the use of this medicine in children. Special care may be needed. Overdosage: If you think you've taken too much of this medicine contact a poison control center or emergency room at once. Overdosage: If you think you have taken too much of this medicine contact a poison control center or emergency room at once. NOTE: This medicine is only for you. Do not share this medicine with others. What if I miss a  dose? If you miss a dose, wait and take the next dose at your regular time. Do not take double or extra doses. What may interact with this medicine? Do not take this medicine with any of the following medications: -other gabapentin products (Gralise, Neurontin) This medicine may also interact with the following medications: -alcohol -antacids -antihistamines for allergy, cough and cold -certain medicines for anxiety or sleep -certain medicines for depression or psychotic disturbances -homatropine; hydrocodone -naproxen -narcotic medicines (opiates) for pain -phenothiazines like chlorpromazine, mesoridazine, prochlorperazine, thioridazine This list may not describe all possible interactions. Give your health care provider a list of all the medicines, herbs, non-prescription drugs, or dietary supplements you use. Also tell them if you smoke, drink alcohol, or use illegal drugs. Some items may interact with your medicine. What should I watch for while using this medicine? Tell your doctor or healthcare professional if your symptoms do not start to get better or if they get worse. Do not stop taking except on your doctor's advice. You may develop a severe reaction. Your doctor will tell you how much medicine to take. Alcohol may interfere with the effects of this medicine. Avoid alcoholic drinks. You may get drowsy or dizzy. Do not drive, use machinery, or do anything that needs mental alertness until you know how this medicine affects you. Do not stand or sit up quickly, especially if you are an older patient. This reduces the risk of dizzy or fainting spells. Patients and their families should watch out for worsening depression or thoughts of suicide. Any worsening of mood,  or thoughts of suicide or dying should be reported to your health care professional right away. Your mouth may get dry. Chewing sugarless gum or sucking hard candy, and drinking plenty of water may help. Contact your doctor if  the problem does not go away or is severe. What side effects may I notice from receiving this medicine? Side effects that you should report to your doctor or health care professional as soon as possible: -allergic reactions like skin rash, itching or hives, swelling of the face, lips, or tongue -suicidal thoughts or other mood changes Side effects that usually do not require medical attention (Report these to your doctor or health care professional if they continue or are bothersome.): -dizziness -dry mouth -headache -irritability -nausea -tiredness -tremors -weight gain This list may not describe all possible side effects. Call your doctor for medical advice about side effects. You may report side effects to FDA at 1-800-FDA-1088. Where should I keep my medicine? Keep out of the reach of children. Store between 15 and 30 degrees C (59 and 86 degrees F). Throw away any unused medicine after the expiration date. Keep this medicine dry and away from moisture. NOTE: This sheet is a summary. It may not cover all possible information. If you have questions about this medicine, talk to your doctor, pharmacist, or health care provider.  2015, [employer] Standard. (2011-03-11 11:53:40)

## 2014-05-23 NOTE — Progress Notes (Signed)
[name] DOB: Jun 23, 1965  REASON FOR VISIT: follow up- restless leg syndrome, memory disturbance HISTORY FROM: patient  HISTORY OF PRESENT ILLNESS: [name] [name] is a 49 year old female with a history of restless leg syndrome. She returns today for follow-up. She is currently taking Mirapex 2 mg and Requip 4 mg at bedtime. She states this has been working well for her however she has noticed that her discomfort from restless legs has increased. She states during the day it is pretty controlled but at night she has a hard time falling asleep due to the discomfort. She also states that since the last visit she has gained approximately 20 pounds. She states that she is hungry all the time and feels the need to eat. The patient also states that lately she has been experiencing excessive daytime sleepiness. She states that she will fall asleep while playing on her phone. She states that she is drowsy while driving. She typically has to drive with the windows down. She states that this started approximately in September and has continued to progress. She also states that she is currently having marital issues. This is been ongoing for 5 years however in September It got worse. She states that she is able to fall sleep at night however her mind races throughout the night. Unsure how much restorative sleep she actually receives. She does report that her memory has remained the same. She still has trouble remembering people's names whom she has known for a long time. She doesn't report any additional symptoms. She returns today for an evaluation.  [name] [name] is a 49 year old female with a history of Restless Leg Syndrome. She returns today for follow-up. She is currently taking Mirapex 2 mg and Requip 4 mg and this is offering her modest relief. She states she has to take them every 24 hours or else she will develop symptoms. She is very satisfies with her current treatment for RLS. She has noticed  some swelling in her ankles mostly at the end of the day. She states she her family has noticed some forgetfulness. She will tell them things but then not remember that she told them already. She operates a Teacher, music, she does have some problems with directions but that is normal for her, states that it has been occuring for 15 years. She does do the finances, she has not noticed any big issues although she has made just simple mistakes in the past. She cooks meals regularly without difficulty. She has had a sleep study in the past that was normal.  HISTORY 07/11/13 (CW): 49 year old right-handed white female with a history of restless leg syndrome. The patient was last seen in November 2014, and she was placed on the Neupro patch. She is on a 4 mg patch, but her symptoms did not abate. Requip was added, and recently was increased from 2 mg at night to a 4 mg daily dose. The patient is now taking both the patch and Requip together. She has not been getting any relief with this medication, getting only 3 or 4 hours of sleep at night. She is having excessive fatigue during the day. She indicates that the restless leg symptoms begin around 3 PM, and continue until about 2 AM. She returns for an evaluation. She is on oral iron supplementation. The patient indicates that she has borderline diabetes  REVIEW OF SYSTEMS: Out of a complete 14 system review of symptoms, the patient complains only of the following  symptoms, and all other reviewed systems are negative.  ALLERGIES: Allergies  Allergen Reactions  . Bee Venom Swelling  . Gabapentin     Nausea  . Abilify [Aripiprazole] Other (See Comments)    MOOD SWING  . Biaxin [Clarithromycin] Diarrhea  . Buspar [Buspirone] Diarrhea  . Lisinopril Cough  . Maxzide [Triamterene-Hctz] Rash  . Phentermine Other (See Comments)    MOOD CHANGE   . Seroquel [Quetiapine Fumarate] Other (See Comments)    MOOD SWINGS    HOME MEDICATIONS: Outpatient  Prescriptions Prior to Visit  Medication Sig Dispense Refill  . buPROPion (WELLBUTRIN XL) 300 MG 24 hr tablet Take 300 mg by mouth daily.    . DULoxetine (CYMBALTA) 60 MG capsule Take 60 mg by mouth daily.    Marland Kitchen levothyroxine (SYNTHROID, LEVOTHROID) 175 MCG tablet Take 175 mcg by mouth daily before breakfast.    . olmesartan-hydrochlorothiazide (BENICAR HCT) 20-12.5 MG per tablet Take 1 tablet by mouth daily.    . pramipexole (MIRAPEX) 1 MG tablet Take 2 tablets (2 mg total) by mouth at bedtime. 60 tablet 5  . rOPINIRole (REQUIP) 4 MG tablet TAKE 1 TABLET BY MOUTH EVERY DAY AS DIRECTED 30 tablet 0   No facility-administered medications prior to visit.    PAST MEDICAL HISTORY: Past Medical History  Diagnosis Date  . HTN (hypertension)   . Hypothyroidism   . Depression   . RLS (restless legs syndrome)   . Anxiety 01/12/2013  . Obesity   . Dyslipidemia   . Migraine     PAST SURGICAL HISTORY: Past Surgical History  Procedure Laterality Date  . Breast biopsy      x 2 benign lesion  . Cervical discectomy  2003  . Neck surgery  2010  . Vaginal hysterectomy  2009  . Fusion  2009    L2-L3  . Hand fracture surgery      FAMILY HISTORY: Family History  Problem Relation Age of Onset  . Hypertension Father   . Diabetes Father   . Diabetes Brother     SOCIAL HISTORY: History   Social History  . Marital Status: Married    Spouse Name: N/A  . Number of Children: 2  . Years of Education: COLLEGE   Occupational History  . OWNER    Social History Main Topics  . Smoking status: Never Smoker   . Smokeless tobacco: Never Used  . Alcohol Use: No  . Drug Use: No  . Sexual Activity: Not on file   Other Topics Concern  . Not on file   Social History Narrative   Patient is married with 2 children.   Patient is right handed.   Patient has a college education.   Patient drinks 3 cups daily.      PHYSICAL EXAM  Filed Vitals:   05/23/14 0953  BP: 128/80  Pulse: 72    Height: 5\' 4"  (1.626 m)  Weight: 250 lb (113.399 kg)   Body mass index is 42.89 kg/(m^2).  Generalized: Well developed, in no acute distress . Tearful when speaking about her marriage  Neurological examination  Mentation: Alert oriented to time, place, history taking. Follows all commands speech and language fluent. MMSE 30/30 Cranial nerve II-XII: Pupils were equal round reactive to light. Extraocular movements were full, visual field were full on confrontational test. Facial sensation and strength were normal. Uvula tongue midline. Head turning and shoulder shrug  were normal and symmetric. Motor: The motor testing reveals 5 over 5 strength of all 4  extremities. Good symmetric motor tone is noted throughout.  Sensory: Sensory testing is intact to soft touch on all 4 extremities. No evidence of extinction is noted.  Coordination: Cerebellar testing reveals good finger-nose-finger and heel-to-shin bilaterally.  Gait and station: Gait is normal. Tandem gait is normal. Romberg is negative. No drift is seen.  Reflexes: Deep tendon reflexes are symmetric and normal bilaterally.  Marland Kitchen   DIAGNOSTIC DATA (LABS, IMAGING, TESTING) - I reviewed patient records, labs, notes, testing and imaging myself where available.      ASSESSMENT AND PLAN 49 y.o. year old female  has a past medical history of HTN (hypertension); Hypothyroidism; Depression; RLS (restless legs syndrome); Anxiety (01/12/2013); Obesity; Dyslipidemia; and Migraine. here with:  1. Restless leg syndrome 2. Mild memory disturbance 3. Anxiety  Patient's restless leg is no longer controlled with Requip and Mirapex. She also ports excessive eating. This could represent a compulsive behavior brought on by Requip and Mirapex. I will wean the patient off Mirapex. Start the patient on gabapentin Enacacarbil ER 300 mg to be taken at 5 PM with food. This dose may have to be increased to receive maximal benefit. The patient reported previously  that she took gabapentin and it caused nausea. However the patient wants to retry the gabapentin at this time. In the future Requip may also need to be weaned off. The patient's daytime sleepiness could be contributed to her stress at home as well as medication. We will continue to follow this. If her daytime sleepiness does not improve we will do further workup. Her memory has remained stable MMSE 30/30. She will follow-up in 3 months or sooner if needed.  Ward Givens, MSN, NP-C 05/23/2014, 10:10 AM [facility] [address], [place], [place] [address] [phone]  Note: This document was prepared with digital dictation and possible smart phrase technology. Any transcriptional errors that result from this process are unintentional.

## 2014-05-28 ENCOUNTER — Other Ambulatory Visit: Payer: Self-pay | Admitting: Adult Health

## 2014-06-26 ENCOUNTER — Other Ambulatory Visit: Payer: Self-pay | Admitting: Adult Health

## 2014-06-26 ENCOUNTER — Other Ambulatory Visit: Payer: Self-pay | Admitting: Neurology

## 2014-06-26 NOTE — Telephone Encounter (Signed)
Duplicate Request.

## 2014-09-05 ENCOUNTER — Other Ambulatory Visit: Payer: Self-pay | Admitting: Adult Health

## 2014-09-05 NOTE — Telephone Encounter (Signed)
Last OV note says: Wean off Mirapex- take 1 tablet for the next 3 days then decrease to 1/2 tablet for 3 days then stop.

## 2014-09-13 ENCOUNTER — Ambulatory Visit (INDEPENDENT_AMBULATORY_CARE_PROVIDER_SITE_OTHER): Payer: BLUE CROSS/BLUE SHIELD | Admitting: Adult Health

## 2014-09-13 ENCOUNTER — Encounter: Payer: Self-pay | Admitting: Adult Health

## 2014-09-13 VITALS — BP 153/83 | HR 89 | Ht 64.0 in | Wt 248.0 lb

## 2014-09-13 DIAGNOSIS — G2581 Restless legs syndrome: Secondary | ICD-10-CM

## 2014-09-13 MED ORDER — ROPINIROLE HCL 4 MG PO TABS: 4.0000 mg | ORAL_TABLET | Freq: Every day | ORAL | Status: DC

## 2014-09-13 MED ORDER — GABAPENTIN ENACARBIL ER 300 MG PO TBCR: 600.0000 mg | EXTENDED_RELEASE_TABLET | Freq: Every day | ORAL | Status: DC

## 2014-09-13 NOTE — Progress Notes (Signed)
[name] DOB: 05-05-1965  REASON FOR VISIT: follow up- RLS  HISTORY FROM: patient  HISTORY OF PRESENT ILLNESS: [name] [name] is a 49 year old female with a history of restless leg syndrome. She returns today for follow-up. At the last visit the patient was weaned off of Mirapex after reporting excessive eating and weight gain. She was started on gabapentin ER 300 mg. She states that the gabapentin and Requip did not control her restless legs. She had take gabapentin at 12 PM Mirapex at 5 PM and Requip at 10 PM. She states that this combination offered her great relief. She states that she recently ran out of the Mirapex and did not get it refilled. At this time she would rather increase the gabapentin to see if it offers her any better benefit instead of starting Mirapex again. Her weight has been stable since last visit. She does feel that her ecessive eating has improved. Denies any excessive drowsiness. The patient had  back surgery in April by Dr. Ronnald Ramp and is doing well. The patient continues to experience some stress due to her marital situation. States unfortunately she has not been able to deal with those issues due to her back surgery. She returns today for an evaluation.  HISTORY 05/23/14: [name] [name] is a 49 year old female with a history of restless leg syndrome. She returns today for follow-up. She is currently taking Mirapex 2 mg and Requip 4 mg at bedtime. She states this has been working well for her however she has noticed that her discomfort from restless legs has increased. She states during the day it is pretty controlled but at night she has a hard time falling asleep due to the discomfort. She also states that since the last visit she has gained approximately 20 pounds. She states that she is hungry all the time and feels the need to eat. The patient also states that lately she has been experiencing excessive daytime sleepiness. She states that she will fall asleep  while playing on her phone. She states that she is drowsy while driving. She typically has to drive with the windows down. She states that this started approximately in September and has continued to progress. She also states that she is currently having marital issues. This is been ongoing for 5 years however in September It got worse. She states that she is able to fall sleep at night however her mind races throughout the night. Unsure how much restorative sleep she actually receives. She does report that her memory has remained the same. She still has trouble remembering people's names whom she has known for a long time. She doesn't report any additional symptoms. She returns today for an evaluation.  REVIEW OF SYSTEMS: Out of a complete 14 system review of symptoms, the patient complains only of the following symptoms, and all other reviewed systems are negative.  See history of present illness  ALLERGIES: Allergies  Allergen Reactions  . Bee Venom Swelling and Anaphylaxis  . Biaxin [Clarithromycin] Diarrhea and Rash  . Gabapentin     Nausea  . Abilify [Aripiprazole] Other (See Comments)    MOOD SWING  . Buspar [Buspirone] Diarrhea  . Lisinopril Cough and Other (See Comments)    Cough  . Maxzide [Triamterene-Hctz] Rash  . Phentermine Other (See Comments)    MOOD CHANGE   . Seroquel [Quetiapine Fumarate] Other (See Comments)    MOOD SWINGS    HOME MEDICATIONS: Outpatient Prescriptions Prior to Visit  Medication Sig Dispense  Refill  . buPROPion (WELLBUTRIN XL) 300 MG 24 hr tablet Take 300 mg by mouth daily.    . DULoxetine (CYMBALTA) 60 MG capsule Take 60 mg by mouth daily.    . Gabapentin Enacarbil ER 300 MG TBCR Take 300 mg by mouth daily. At [place] with food. 30 tablet 3  . levothyroxine (SYNTHROID, LEVOTHROID) 175 MCG tablet Take 175 mcg by mouth daily before breakfast.    . olmesartan-hydrochlorothiazide (BENICAR HCT) 20-12.5 MG per tablet Take 1 tablet by mouth daily.    Marland Kitchen  rOPINIRole (REQUIP) 4 MG tablet TAKE 1 TABLET EYE EVERY DAY AS DIRECTED 30 tablet 3   No facility-administered medications prior to visit.    PAST MEDICAL HISTORY: Past Medical History  Diagnosis Date  . HTN (hypertension)   . Hypothyroidism   . Depression   . RLS (restless legs syndrome)   . Anxiety 01/12/2013  . Obesity   . Dyslipidemia   . Migraine     PAST SURGICAL HISTORY: Past Surgical History  Procedure Laterality Date  . Breast biopsy      x 2 benign lesion  . Cervical discectomy  2003  . Neck surgery  2010  . Vaginal hysterectomy  2009  . Fusion  2009    L2-L3  . Hand fracture surgery    . Back surgery  2016    FAMILY HISTORY: Family History  Problem Relation Age of Onset  . Hypertension Father   . Diabetes Father   . Diabetes Brother     SOCIAL HISTORY: History   Social History  . Marital Status: Married    Spouse Name: [name]  . Number of Children: 2  . Years of Education: COLLEGE   Occupational History  . OWNER    Social History Main Topics  . Smoking status: Never Smoker   . Smokeless tobacco: Never Used  . Alcohol Use: No  . Drug Use: No  . Sexual Activity: Not on file   Other Topics Concern  . Not on file   Social History Narrative   Patient is married [name])  2 children.   Patient is right handed.   Patient has a college education.   Patient drinks 3 cups daily.      PHYSICAL EXAM  Filed Vitals:   09/13/14 0759  BP: 153/83  Pulse: 89  Height: 5\' 4"  (1.626 m)  Weight: 248 lb (112.492 kg)   Body mass index is 42.55 kg/(m^2).  Generalized: Well developed, in no acute distress   Neurological examination  Mentation: Alert oriented to time, place, history taking. Follows all commands speech and language fluent Cranial nerve II-XII: Pupils were equal round reactive to light. Extraocular movements were full, visual field were full on confrontational test. Facial sensation and strength were normal. Uvula tongue midline. Head  turning and shoulder shrug  were normal and symmetric. Motor: The motor testing reveals 5 over 5 strength of all 4 extremities. Good symmetric motor tone is noted throughout.  Sensory: Sensory testing is intact to soft touch on all 4 extremities. No evidence of extinction is noted.  Coordination: Cerebellar testing reveals good finger-nose-finger and heel-to-shin bilaterally.  Gait and station: Gait is normal. Tandem gait is slightly unsteady. Romberg is negative. No drift is seen.  Reflexes: Deep tendon reflexes are symmetric and normal bilaterally.   DIAGNOSTIC DATA (LABS, IMAGING, TESTING) - I reviewed patient records, labs, notes, testing and imaging myself where available.    ASSESSMENT AND PLAN 49 y.o. year old female  has  a past medical history of HTN (hypertension); Hypothyroidism; Depression; RLS (restless legs syndrome); Anxiety (01/12/2013); Obesity; Dyslipidemia; and Migraine. here with:  1. Restless leg syndrome, uncontrolled  The patient reported that she was getting good benefit taking gabapentin, Mirapex and Requip. However she would like to increase the gabapentin and stay off of Mirapex if possible. Increase her gabapentin ER to 600 mg 1 tablet daily. She will continue taking Requip 4 mg 1 tablet at bedtime. Patient advised that if her symptoms worsen or she develops new symptoms she should let us know. Otherwise she will follow-up in 6 months or sooner if needed.  Ward Givens, MSN, NP-C 09/13/2014, 8:10 AM [facility] [address] [place], [place] [address] [phone]  Note: This document was prepared with digital dictation and possible smart phrase technology. Any transcriptional errors that result from this process are unintentional.

## 2014-09-13 NOTE — Progress Notes (Signed)
I have read the note, and I agree with the clinical assessment and plan.  [name] [name],[name] [name]   

## 2014-09-13 NOTE — Patient Instructions (Signed)
Increase gabapentin to 600 mg daily Continue Requip 4 mg daily. If your symptoms worsen or you develop new symptoms please let us know.

## 2014-09-26 ENCOUNTER — Telehealth: Payer: Self-pay | Admitting: Adult Health

## 2014-09-26 MED ORDER — GABAPENTIN ENACARBIL ER 300 MG PO TBCR: 600.0000 mg | EXTENDED_RELEASE_TABLET | Freq: Every day | ORAL | Status: DC

## 2014-09-26 NOTE — Telephone Encounter (Signed)
Pt called about Gabapentin Enacarbil ER 300 MG TBCR and says that it is working, that it was discussed dosage would be increased to 600 mg. Pt needs new Rx please. May call pt at [phone]

## 2014-09-26 NOTE — Telephone Encounter (Signed)
Last OV note says: Increase gabapentin to 600 mg daily.   I called the pharmacy.  Spoke with [facility].  She said they last filled the old dose of one nightly on 07/11.  I called the patient back.  Said she has been taking two of the 300mg  nightly, and it has seemed to work well.  Advised pharmacy was filling old Rx, and we have sent a new Rx with updated instructions.  Patient expressed understanding.  She will call us back if anything further is needed.

## 2014-09-27 NOTE — Telephone Encounter (Signed)
Pt called and states the [facility] pharmacy has not received fax for new Rx.

## 2014-09-27 NOTE — Telephone Encounter (Signed)
I called the pharmacy.  Spoke with Maudie Mercury.  She verified they did get the Rx that was sent, however, they saved it on the patient's file because ins said it was too soon to refill at this time.  She is going to try and see if they can get a dose change override through ins.  I called the patient back.  Got no answer.  Left message relaying this info.

## 2014-09-28 LAB — HEMOGLOBIN A1C: Hemoglobin A1C: 6.2

## 2014-10-18 ENCOUNTER — Telehealth: Payer: Self-pay

## 2014-10-18 ENCOUNTER — Other Ambulatory Visit: Payer: Self-pay | Admitting: Adult Health

## 2014-10-18 MED ORDER — ROPINIROLE HCL ER 4 MG PO TB24: 4.0000 mg | ORAL_TABLET | Freq: Every day | ORAL | Status: DC

## 2014-10-18 NOTE — Telephone Encounter (Signed)
I called the patient. She reports that the gabapentin was initially beneficial. However her symptoms have started to worsen especially during the day. We will try the patient on Requip extended release 4 mg daily. She will continue on the gabapentin for now. If her symptoms worsen or she develops new symptoms she was advised to let us know.

## 2014-10-18 NOTE — Telephone Encounter (Signed)
Patient is calling back to advise the Gabapentin Enacarbil ER 300 MG TBCR is not helping much even taking 2 pills daily. Please call to discuss.

## 2014-10-18 NOTE — Addendum Note (Signed)
Addended by: Trudie Buckler on: 10/18/2014 04:35 PM   Modules accepted: Orders, Medications

## 2014-10-18 NOTE — Telephone Encounter (Signed)
I called the patient and left a message for her to call the office.

## 2014-10-18 NOTE — Telephone Encounter (Signed)
Patient is returning a call. °

## 2014-10-18 NOTE — Telephone Encounter (Signed)
[name] [name] at 10/18/2014 11:08 AM     Status: Signed       Expand All Collapse All   Patient is calling back to advise the Gabapentin Enacarbil ER 300 MG TBCR is not helping much even taking 2 pills daily.

## 2015-01-23 ENCOUNTER — Other Ambulatory Visit: Payer: Self-pay | Admitting: Adult Health

## 2015-02-12 ENCOUNTER — Other Ambulatory Visit: Payer: Self-pay | Admitting: Adult Health

## 2015-03-20 ENCOUNTER — Encounter: Payer: Self-pay | Admitting: Adult Health

## 2015-03-20 ENCOUNTER — Ambulatory Visit (INDEPENDENT_AMBULATORY_CARE_PROVIDER_SITE_OTHER): Payer: BLUE CROSS/BLUE SHIELD | Admitting: Adult Health

## 2015-03-20 VITALS — BP 133/73 | HR 94 | Ht 64.0 in | Wt 251.5 lb

## 2015-03-20 DIAGNOSIS — G2581 Restless legs syndrome: Secondary | ICD-10-CM

## 2015-03-20 NOTE — Progress Notes (Signed)
I have read the note, and I agree with the clinical assessment and plan.  WILLIS,CHARLES KEITH   

## 2015-03-20 NOTE — Progress Notes (Signed)
[name] DOB: [birth year]  REASON FOR VISIT: follow up- RLS HISTORY FROM: patient  HISTORY OF PRESENT ILLNESS: [name] [name] is a 50 year old female with a history of restless leg syndrome. She returns today for follow-up visit the patient continues to take gabapentin ER 600 mg daily as well as Requip extended release 4 mg. She reports that this combination medication is working well. She states that she rarely has any symptoms of restless leg. She states that she does have symptoms is because she's delayed in taking her medication or missed the dose. She states that she is sleeping well. She continues to have some stress in the home. She denies any new neurological symptoms. She returns today for an evaluation.  HISTORY 09/13/14: [name] [name] is a 50 year old female with a history of restless leg syndrome. She returns today for follow-up. At the last visit the patient was weaned off of Mirapex after reporting excessive eating and weight gain. She was started on gabapentin ER 300 mg. She states that the gabapentin and Requip did not control her restless legs. She had take gabapentin at 12 PM Mirapex at 5 PM and Requip at 10 PM. She states that this combination offered her great relief. She states that she recently ran out of the Mirapex and did not get it refilled. At this time she would rather increase the gabapentin to see if it offers her any better benefit instead of starting Mirapex again. Her weight has been stable since last visit. She does feel that her ecessive eating has improved. Denies any excessive drowsiness. The patient had back surgery in April by Dr. Ronnald Ramp and is doing well. The patient continues to experience some stress due to her marital situation. States unfortunately she has not been able to deal with those issues due to her back surgery. She returns today for an evaluation.  REVIEW OF SYSTEMS: Out of a complete 14 system review of symptoms, the patient complains only  of the following symptoms, and all other reviewed systems are negative.  Excessive thirst  ALLERGIES: Allergies  Allergen Reactions  . Bee Venom Swelling and Anaphylaxis  . Biaxin [Clarithromycin] Diarrhea and Rash  . Gabapentin     Nausea  . Abilify [Aripiprazole] Other (See Comments)    MOOD SWING  . Buspar [Buspirone] Diarrhea  . Lisinopril Cough and Other (See Comments)    Cough  . Maxzide [Triamterene-Hctz] Rash  . Phentermine Other (See Comments)    MOOD CHANGE   . Seroquel [Quetiapine Fumarate] Other (See Comments)    MOOD SWINGS    HOME MEDICATIONS: Outpatient Prescriptions Prior to Visit  Medication Sig Dispense Refill  . buPROPion (WELLBUTRIN XL) 300 MG 24 hr tablet Take 300 mg by mouth daily.    . DULoxetine (CYMBALTA) 60 MG capsule Take 60 mg by mouth daily.    Marland Kitchen HORIZANT 300 MG TBCR TAKE 600 MG BY MOUTH DAILY. AT 5PM WITH FOOD. 60 tablet 3  . levothyroxine (SYNTHROID, LEVOTHROID) 175 MCG tablet Take 175 mcg by mouth daily before breakfast.    . olmesartan-hydrochlorothiazide (BENICAR HCT) 20-12.5 MG per tablet Take 1 tablet by mouth daily.    Marland Kitchen PREDNISONE, PAK, PO Take by mouth as directed.    Marland Kitchen rOPINIRole (REQUIP XL) 4 MG 24 hr tablet TAKE 1 TABLET (4 MG TOTAL) BY MOUTH AT BEDTIME. 30 tablet 3   No facility-administered medications prior to visit.    PAST MEDICAL HISTORY: Past Medical History  Diagnosis Date  . HTN (  hypertension)   . Hypothyroidism   . Depression   . RLS (restless legs syndrome)   . Anxiety 01/12/2013  . Obesity   . Dyslipidemia   . Migraine     PAST SURGICAL HISTORY: Past Surgical History  Procedure Laterality Date  . Breast biopsy      x 2 benign lesion  . Cervical discectomy  2003  . Neck surgery  2010  . Vaginal hysterectomy  2009  . Fusion  2009    L2-L3  . Hand fracture surgery    . Back surgery  2016    FAMILY HISTORY: Family History  Problem Relation Age of Onset  . Hypertension Father   . Diabetes Father   .  Diabetes Brother     SOCIAL HISTORY: Social History   Social History  . Marital Status: Married    Spouse Name: [name]  . Number of Children: 2  . Years of Education: COLLEGE   Occupational History  . OWNER    Social History Main Topics  . Smoking status: Never Smoker   . Smokeless tobacco: Never Used  . Alcohol Use: No  . Drug Use: No  . Sexual Activity: Not on file   Other Topics Concern  . Not on file   Social History Narrative   Patient is married [name])  2 children.   Patient is right handed.   Patient has a college education.   Patient drinks 3 cups daily.      PHYSICAL EXAM  Filed Vitals:   03/20/15 1040  BP: 133/73  Pulse: 94  Height: 5\' 4"  (1.626 m)  Weight: 251 lb 8 oz (114.08 kg)   Body mass index is 43.15 kg/(m^2).  Generalized: Well developed, in no acute distress   Neurological examination  Mentation: Alert oriented to time, place, history taking. Follows all commands speech and language fluent Cranial nerve II-XII: Pupils were equal round reactive to light. Extraocular movements were full, visual field were full on confrontational test. Facial sensation and strength were normal. Uvula tongue midline. Head turning and shoulder shrug  were normal and symmetric. Motor: The motor testing reveals 5 over 5 strength of all 4 extremities. Good symmetric motor tone is noted throughout.  Sensory: Sensory testing is intact to soft touch on all 4 extremities. No evidence of extinction is noted.  Coordination: Cerebellar testing reveals good finger-nose-finger and heel-to-shin bilaterally.  Gait and station: Gait is normal. Tandem gait is normal. Romberg is negative. No drift is seen.  Reflexes: Deep tendon reflexes are symmetric and normal bilaterally.   DIAGNOSTIC DATA (LABS, IMAGING, TESTING) - I reviewed patient records, labs, notes, testing and imaging myself where available.  Lab Results  Component Value Date   WBC 8.6 11/11/2010   HGB 13.7  11/11/2010   HCT 40.2 11/11/2010   MCV 88.9 11/11/2010   PLT 289 11/11/2010      Component Value Date/Time   NA 140 11/11/2010 0956   K 3.8 11/11/2010 0956   CL 102 11/11/2010 0956   CO2 29 11/11/2010 0956   GLUCOSE 110* 11/11/2010 0956   BUN 15 11/11/2010 0956   CREATININE 0.61 11/11/2010 0956   CALCIUM 9.9 11/11/2010 0956   PROT 6.0 02/09/2009 0740   ALBUMIN 3.2* 02/09/2009 0740   AST 21 02/09/2009 0740   ALT 27 02/09/2009 0740   ALKPHOS 52 02/09/2009 0740   BILITOT 0.8 02/09/2009 0740   GFRNONAA >60 11/11/2010 0956   GFRAA >60 11/11/2010 [id number]  ASSESSMENT AND PLAN 50 y.o. year old female  has a past medical history of HTN (hypertension); Hypothyroidism; Depression; RLS (restless legs syndrome); Anxiety (01/12/2013); Obesity; Dyslipidemia; and Migraine. here with:  1. RLS  Overall the patient is doing well. She will continue on gabapentin and Requip. This combination of medication is working well for the patient. She is advised that if her symptoms worsen or she develops new symptoms she should let us know. She will follow-up in 7-8 months with Dr. Jannifer Franklin.   Ward Givens, MSN, NP-C 03/20/2015, 10:43 AM [facility] [address] [place], [place] [address] [phone]

## 2015-03-20 NOTE — Patient Instructions (Signed)
Continue gabapentin ER 2 tablets (600mg ) daily  Continue Requip ER If your symptoms worsen or you develop new symptoms please let us know.

## 2015-04-03 LAB — LIPID PANEL
Cholesterol: 164 mg/dL (ref 0–200)
HDL: 51 mg/dL (ref 35–70)
LDL Cholesterol: 91 mg/dL
Triglycerides: 112 mg/dL (ref 40–160)

## 2015-04-03 LAB — HEPATIC FUNCTION PANEL
ALT: 28 U/L (ref 7–35)
AST: 15 U/L (ref 13–35)

## 2015-04-03 LAB — CBC AND DIFFERENTIAL
HCT: 44 % (ref 36–46)
Hemoglobin: 14.5 g/dL (ref 12.0–16.0)
Platelets: 279 10*3/uL (ref 150–399)
WBC: 9.2 10^3/mL

## 2015-04-03 LAB — HEMOGLOBIN A1C: Hemoglobin A1C: 5.9

## 2015-04-03 LAB — BASIC METABOLIC PANEL
BUN: 20 mg/dL (ref 4–21)
Creatinine: 0.6 mg/dL (ref <=1.1)
Glucose: 91 mg/dL
Potassium: 3.9 mmol/L (ref 3.4–5.3)
Sodium: 141 mmol/L (ref 137–147)

## 2015-04-10 ENCOUNTER — Other Ambulatory Visit: Payer: Self-pay | Admitting: Surgical Oncology

## 2015-04-11 ENCOUNTER — Other Ambulatory Visit: Payer: Self-pay | Admitting: Surgical Oncology

## 2015-04-11 DIAGNOSIS — K219 Gastro-esophageal reflux disease without esophagitis: Secondary | ICD-10-CM

## 2015-04-11 DIAGNOSIS — I10 Essential (primary) hypertension: Secondary | ICD-10-CM

## 2015-04-11 DIAGNOSIS — R7309 Other abnormal glucose: Secondary | ICD-10-CM

## 2015-04-16 ENCOUNTER — Encounter: Payer: Self-pay | Admitting: *Deleted

## 2015-04-16 ENCOUNTER — Telehealth: Payer: Self-pay | Admitting: Neurology

## 2015-04-16 NOTE — Telephone Encounter (Signed)
Jill/[employer] [phone] ext [phone] called to advise request for HORIZANT 300 MG TBCR has been approved effective today 04/16/15.

## 2015-04-17 ENCOUNTER — Ambulatory Visit
Admission: RE | Admit: 2015-04-17 | Discharge: 2015-04-17 | Disposition: A | Discharge status: Home / Self Care | Payer: PRIVATE HEALTH INSURANCE | Source: Ambulatory Visit | Attending: Surgical Oncology | Admitting: Surgical Oncology

## 2015-04-17 DIAGNOSIS — K219 Gastro-esophageal reflux disease without esophagitis: Secondary | ICD-10-CM

## 2015-04-17 DIAGNOSIS — R7309 Other abnormal glucose: Secondary | ICD-10-CM

## 2015-04-17 DIAGNOSIS — I10 Essential (primary) hypertension: Secondary | ICD-10-CM

## 2015-04-24 ENCOUNTER — Encounter: Payer: Self-pay | Admitting: *Deleted

## 2015-06-04 DIAGNOSIS — F331 Major depressive disorder, recurrent, moderate: Secondary | ICD-10-CM | POA: Diagnosis not present

## 2015-06-06 ENCOUNTER — Telehealth: Payer: Self-pay | Admitting: Adult Health

## 2015-06-06 MED ORDER — ROPINIROLE HCL ER 2 MG PO TB24: 2.0000 mg | ORAL_TABLET | Freq: Every day | ORAL | Status: DC

## 2015-06-06 NOTE — Telephone Encounter (Signed)
Called patient to clarify her c/o , issues. She stated she is taking ropinirole at noontime, taking Horizant, 2 tabs at 9 pm nightly, which is different from her Med record. She stated approximately one week ago she began to have more problems with RLS which has increased over the week. Last night she was unable to get relief with ropinirole, Horizant, so she took gabapentin twice and repeated ropinirole at 4 am. She stated she did not get relief of leg pain until 9 am this morning. She denied any new medications, changes in her daily routine. She stated that in the past, there RLS typically continues to get worse. Informed her this RN would route her c/o to Dr Jannifer Franklin for recommendations. She verbalized understanding, appreciation.

## 2015-06-06 NOTE — Telephone Encounter (Signed)
Pt said the restless leg was worse last night than it has ever been. She said symptoms started about 7pm. At 9pm she took HORIZANT 300 MG TBCR , she took gabapentin 300mg  at 12am and again at 3am, and at 4am she took rOPINIRole (REQUIP XL) 4 MG 24 hr tablet . Pt said she could not lay down, she marched in place ,rolled on the floor, she could not be still until this morning. Sts symptoms calmed down about 8am this morning. She said the gabapentin was left over when Dr Jannifer Franklin had prescribed it for her to try 6/16. Said she knew not to take all that medication but felt she needed some type of relief. Please call and advise

## 2015-06-06 NOTE — Telephone Encounter (Signed)
I called the patient. The patient has had a change in her condition of the last couple weeks, last night was very had with the restless legs. I will increase the Requip adding a 2 mg tablet at 4 mg that she is taking. The patient is on 600 mg of the Horizant in the evening. If the medications are not effective, we may recheck iron levels, consider IV iron therapy.

## 2015-06-07 ENCOUNTER — Other Ambulatory Visit: Payer: Self-pay | Admitting: Adult Health

## 2015-06-18 ENCOUNTER — Other Ambulatory Visit: Payer: Self-pay | Admitting: *Deleted

## 2015-06-18 MED ORDER — ROPINIROLE HCL ER 4 MG PO TB24: 4.0000 mg | ORAL_TABLET | Freq: Every day | ORAL | Status: DC

## 2015-07-22 DIAGNOSIS — M542 Cervicalgia: Secondary | ICD-10-CM | POA: Diagnosis not present

## 2015-07-22 DIAGNOSIS — R51 Headache: Secondary | ICD-10-CM | POA: Diagnosis not present

## 2015-08-14 DIAGNOSIS — R946 Abnormal results of thyroid function studies: Secondary | ICD-10-CM | POA: Diagnosis not present

## 2015-09-06 ENCOUNTER — Other Ambulatory Visit: Payer: Self-pay | Admitting: Family Medicine

## 2015-09-06 DIAGNOSIS — E041 Nontoxic single thyroid nodule: Secondary | ICD-10-CM

## 2015-09-06 DIAGNOSIS — E039 Hypothyroidism, unspecified: Secondary | ICD-10-CM | POA: Diagnosis not present

## 2015-09-06 DIAGNOSIS — L659 Nonscarring hair loss, unspecified: Secondary | ICD-10-CM | POA: Diagnosis not present

## 2015-09-09 ENCOUNTER — Ambulatory Visit
Admission: RE | Admit: 2015-09-09 | Discharge: 2015-09-09 | Disposition: A | Discharge status: Home / Self Care | Payer: BLUE CROSS/BLUE SHIELD | Source: Ambulatory Visit | Attending: Family Medicine | Admitting: Family Medicine

## 2015-09-09 DIAGNOSIS — E041 Nontoxic single thyroid nodule: Secondary | ICD-10-CM

## 2015-09-24 ENCOUNTER — Other Ambulatory Visit: Payer: Self-pay | Admitting: *Deleted

## 2015-09-24 MED ORDER — GABAPENTIN ENACARBIL ER 300 MG PO TBCR: EXTENDED_RELEASE_TABLET | ORAL | 0 refills | Status: DC

## 2015-09-25 DIAGNOSIS — E039 Hypothyroidism, unspecified: Secondary | ICD-10-CM | POA: Diagnosis not present

## 2015-09-25 DIAGNOSIS — E559 Vitamin D deficiency, unspecified: Secondary | ICD-10-CM | POA: Diagnosis not present

## 2015-09-25 DIAGNOSIS — R5381 Other malaise: Secondary | ICD-10-CM | POA: Diagnosis not present

## 2015-09-25 DIAGNOSIS — G4700 Insomnia, unspecified: Secondary | ICD-10-CM | POA: Diagnosis not present

## 2015-09-25 DIAGNOSIS — N951 Menopausal and female climacteric states: Secondary | ICD-10-CM | POA: Diagnosis not present

## 2015-09-25 DIAGNOSIS — L659 Nonscarring hair loss, unspecified: Secondary | ICD-10-CM | POA: Diagnosis not present

## 2015-09-25 LAB — VITAMIN D 25 HYDROXY (VIT D DEFICIENCY, FRACTURES): Vit D, 25-Hydroxy: 26.8

## 2015-09-25 LAB — TSH: TSH: 5.18 u[IU]/mL (ref <=5.90)

## 2015-09-28 ENCOUNTER — Telehealth: Payer: Self-pay | Admitting: Neurology

## 2015-09-30 ENCOUNTER — Other Ambulatory Visit: Payer: Self-pay

## 2015-09-30 MED ORDER — ROPINIROLE HCL ER 2 MG PO TB24: 2.0000 mg | ORAL_TABLET | Freq: Every day | ORAL | 5 refills | Status: DC

## 2015-09-30 NOTE — Telephone Encounter (Signed)
Pt called said she ran out of rOPINIRole (REQUIP XL) 2 MG 24 hr tablet yesterday. When can this medication be refilled. pls call her at [phone]

## 2015-10-02 DIAGNOSIS — R5381 Other malaise: Secondary | ICD-10-CM | POA: Diagnosis not present

## 2015-10-02 DIAGNOSIS — R5383 Other fatigue: Secondary | ICD-10-CM | POA: Diagnosis not present

## 2015-10-08 DIAGNOSIS — E039 Hypothyroidism, unspecified: Secondary | ICD-10-CM | POA: Diagnosis not present

## 2015-10-08 DIAGNOSIS — R350 Frequency of micturition: Secondary | ICD-10-CM | POA: Diagnosis not present

## 2015-10-08 DIAGNOSIS — M545 Low back pain: Secondary | ICD-10-CM | POA: Diagnosis not present

## 2015-10-08 DIAGNOSIS — L659 Nonscarring hair loss, unspecified: Secondary | ICD-10-CM | POA: Diagnosis not present

## 2015-10-22 ENCOUNTER — Other Ambulatory Visit: Payer: Self-pay | Admitting: Neurology

## 2015-10-31 DIAGNOSIS — R5383 Other fatigue: Secondary | ICD-10-CM | POA: Diagnosis not present

## 2015-10-31 DIAGNOSIS — E039 Hypothyroidism, unspecified: Secondary | ICD-10-CM | POA: Diagnosis not present

## 2015-10-31 DIAGNOSIS — N951 Menopausal and female climacteric states: Secondary | ICD-10-CM | POA: Diagnosis not present

## 2015-11-09 ENCOUNTER — Other Ambulatory Visit: Payer: Self-pay | Admitting: Adult Health

## 2015-11-14 ENCOUNTER — Other Ambulatory Visit: Payer: Self-pay | Admitting: Adult Health

## 2015-11-18 ENCOUNTER — Other Ambulatory Visit: Payer: Self-pay | Admitting: Adult Health

## 2015-11-19 ENCOUNTER — Ambulatory Visit (INDEPENDENT_AMBULATORY_CARE_PROVIDER_SITE_OTHER): Payer: BLUE CROSS/BLUE SHIELD | Admitting: Neurology

## 2015-11-19 ENCOUNTER — Encounter: Payer: Self-pay | Admitting: Neurology

## 2015-11-19 ENCOUNTER — Other Ambulatory Visit: Payer: Self-pay

## 2015-11-19 VITALS — BP 113/69 | HR 79 | Ht 64.0 in | Wt 243.5 lb

## 2015-11-19 DIAGNOSIS — G2581 Restless legs syndrome: Secondary | ICD-10-CM | POA: Diagnosis not present

## 2015-11-19 DIAGNOSIS — R413 Other amnesia: Secondary | ICD-10-CM | POA: Diagnosis not present

## 2015-11-19 MED ORDER — GABAPENTIN ENACARBIL ER 300 MG PO TBCR: EXTENDED_RELEASE_TABLET | ORAL | 11 refills | Status: DC

## 2015-11-19 MED ORDER — ROPINIROLE HCL ER 2 MG PO TB24: 2.0000 mg | ORAL_TABLET | Freq: Every day | ORAL | 11 refills | Status: DC

## 2015-11-19 MED ORDER — ROPINIROLE HCL ER 4 MG PO TB24: 4.0000 mg | ORAL_TABLET | Freq: Every day | ORAL | 11 refills | Status: DC

## 2015-11-19 NOTE — Progress Notes (Signed)
Reason for visit: Restless leg syndrome  [name] [name] is an 50 y.o. female  History of present illness:  [name] [name] is a 50 year old right-handed white female with a history of restless leg syndrome. The patient has been under stress recently as she is separating from her husband. The patient had some difficulty with insomnia because of this. The patient had been having increased problems with restless leg syndrome but she recently went on several vitamins such as vitamin C, vitamin B12, vitamin D supplementation. The patient has noted a significant improvement in her symptoms with restless legs. The patient remains on Horizant, and she takes Requip in the extended release preparation, 6 mg daily. The patient indicates that this drug regimen has suppressed the restless legs symptoms throughout the day and evening. She returns for an evaluation.  Past Medical History:  Diagnosis Date  . Anxiety 01/12/2013  . Depression   . Dyslipidemia   . HTN (hypertension)   . Hypothyroidism   . Migraine   . Obesity   . RLS (restless legs syndrome)     Past Surgical History:  Procedure Laterality Date  . BACK SURGERY  2016  . BREAST BIOPSY     x 2 benign lesion  . CERVICAL DISCECTOMY  2003  . fusion  2009   L2-L3  . Hand fracture surgery    . NECK SURGERY  2010  . VAGINAL HYSTERECTOMY  2009    Family History  Problem Relation Age of Onset  . Hypertension Father   . Diabetes Father   . Diabetes Brother     Social history:  reports that she has never smoked. She has never used smokeless tobacco. She reports that she does not drink alcohol or use drugs.    Allergies  Allergen Reactions  . Bee Venom Swelling and Anaphylaxis  . Biaxin [Clarithromycin] Diarrhea and Rash  . Gabapentin     Nausea  . Abilify [Aripiprazole] Other (See Comments)    MOOD SWING  . Buspar [Buspirone] Diarrhea  . Lisinopril Cough and Other (See Comments)    Cough  . Maxzide [Triamterene-Hctz] Rash  .  Phentermine Other (See Comments)    MOOD CHANGE   . Seroquel [Quetiapine Fumarate] Other (See Comments)    MOOD SWINGS    Medications:  Prior to Admission medications   Medication Sig Start Date End Date Taking? Authorizing Provider  Ascorbic Acid (VITAMIN C PO) Take 520 mg by mouth daily.   Yes Historical Provider, MD  BIOTIN PO Take 5,000 mcg by mouth daily.   Yes Historical Provider, MD  buPROPion (WELLBUTRIN XL) 300 MG 24 hr tablet Take 600 mg by mouth daily.    Yes Historical Provider, MD  COLLAGEN PO Take 6,000 mg by mouth 3 (three) times daily.   Yes Historical Provider, MD  DULoxetine (CYMBALTA) 60 MG capsule Take 60 mg by mouth daily.   Yes Historical Provider, MD  estradiol (ESTRACE) 1 MG tablet Take 1 mg by mouth daily. 11/08/15  Yes Historical Provider, MD  HORIZANT 300 MG TBCR TAKE 2 TABLETS BY MOUTH EVERY DAY AT 5PM WITH FOOD 10/22/15  Yes Ward Givens, NP  meloxicam (MOBIC) 15 MG tablet TAKE 1 TABLET BY MOUTH EVERY DAY AS NEEDED FOR PAIN 10/15/15  Yes Historical Provider, MD  Menaquinone-7 (VITAMIN K2 PO) Take 150 mcg by mouth daily.   Yes Historical Provider, MD  NATURE-THROID 48.75 MG TABS TAKE 1.5 TABLETS EVERY MORNING ON AN EMPTY STOMACH 10/22/15  Yes Historical Provider,  MD  Probiotic Product (PROBIOTIC PO) Take by mouth 2 (two) times daily.   Yes Historical Provider, MD  progesterone (PROMETRIUM) 100 MG capsule Take 200 mg by mouth at bedtime. 10/31/15  Yes Historical Provider, MD  rOPINIRole (REQUIP XL) 2 MG 24 hr tablet Take 1 tablet (2 mg total) by mouth daily. 10/04/15  Yes [name], MD  rOPINIRole (REQUIP XL) 4 MG 24 hr tablet TAKE 1 TABLET BY MOUTH AT BEDTIME Patient taking differently: TAKE 1 TABLET BY MOUTH DAILY 11/14/15  Yes Ward Givens, NP  telmisartan-hydrochlorothiazide (MICARDIS HCT) 80-25 MG tablet Take 1 tablet by mouth daily. 11/09/15  Yes Historical Provider, MD  TURMERIC PO Take 500 mg by mouth 2 (two) times daily.   Yes Historical Provider, MD     ROS:  Out of a complete 14 system review of symptoms, the patient complains only of the following symptoms, and all other reviewed systems are negative.  Insomnia, frequent waking, daytime sleepiness  Blood pressure 113/69, pulse 79, height 5\' 4"  (1.626 m), weight 243 lb 8 oz (110.5 kg).  Physical Exam  General: The patient is alert and cooperative at the time of the examination. The patient is markedly obese.  Skin: No significant peripheral edema is noted.   Neurologic Exam  Mental status: The patient is alert and oriented x 3 at the time of the examination. The patient has apparent normal recent and remote memory, with an apparently normal attention span and concentration ability.   Cranial nerves: Facial symmetry is present. Speech is normal, no aphasia or dysarthria is noted. Extraocular movements are full. Visual fields are full.  Motor: The patient has good strength in all 4 extremities.  Sensory examination: Soft touch sensation is symmetric on the face, arms, and legs.  Coordination: The patient has good finger-nose-finger and heel-to-shin bilaterally.  Gait and station: The patient has a normal gait. Tandem gait is unsteady. Romberg is negative. No drift is seen.  Reflexes: Deep tendon reflexes are symmetric.   Assessment/Plan:  1. Restless leg syndrome  The patient is doing fairly well with her current medication regimen. I have given her prescriptions for the 2 mg and 4 mg extended-release Requip tablets, and for the Horizant. The patient will follow-up in 6 months, sooner if needed. She will contact our office if her restless legs symptoms become severe.  Jill Alexanders MD 11/19/2015 8:32 AM  [facility] [address] [place], [place] [address]  Phone [phone] Fax [phone]

## 2015-11-19 NOTE — Patient Instructions (Addendum)
Restless Legs Syndrome Restless legs syndrome is a condition that causes uncomfortable feelings or sensations in the legs, especially while sitting or lying down. The sensations usually cause an overwhelming urge to move the legs. The arms can also sometimes be affected. The condition can range from mild to severe. The symptoms often interfere with a person's ability to sleep. CAUSES The cause of this condition is not known. RISK FACTORS This condition is more likely to develop in:  People who are older than age 50.  Pregnant women. In general, restless legs syndrome is more common in women than in men.  People who have a family history of the condition.  People who have certain medical conditions, such as iron deficiency, kidney disease, Parkinson disease, or nerve damage.  People who take certain medicines, such as medicines for high blood pressure, nausea, colds, allergies, depression, and some heart conditions. SYMPTOMS The main symptom of this condition is uncomfortable sensations in the legs. These sensations may be:  Described as pulling, tingling, prickling, throbbing, crawling, or burning.  Worse while you are sitting or lying down.  Worse during periods of rest or inactivity.  Worse at night, often interfering with your sleep.  Accompanied by a very strong urge to move your legs.  Temporarily relieved by movement of your legs. The sensations usually affect both sides of the body. The arms can also be affected, but this is rare. People who have this condition often have tiredness during the day because of their lack of sleep at night. DIAGNOSIS This condition may be diagnosed based on your description of the symptoms. You may also have tests, including blood tests, to check for other conditions that may lead to your symptoms. In some cases, you may be asked to spend some time in a sleep lab so your sleeping can be monitored. TREATMENT Treatment for this condition is  focused on managing the symptoms. Treatment may include:  Self-help and lifestyle changes.  Medicines. HOME CARE INSTRUCTIONS  Take medicines only as directed by your health care provider.  Try these methods to get temporary relief from the uncomfortable sensations:  Massage your legs.  Walk or stretch.  Take a cold or hot bath.  Practice good sleep habits. For example, go to bed and get up at the same time every day.  Exercise regularly.  Practice ways of relaxing, such as yoga or meditation.  Avoid caffeine and alcohol.  Do not use any tobacco products, including cigarettes, chewing tobacco, or electronic cigarettes. If you need help quitting, ask your health care provider.  Keep all follow-up visits as directed by your health care provider. This is important. SEEK MEDICAL CARE IF: Your symptoms do not improve with treatment, or they get worse.   This information is not intended to replace advice given to you by your health care provider. Make sure you discuss any questions you have with your health care provider.   Document Released: 02/06/2002 Document Revised: 07/03/2014 Document Reviewed: 02/12/2014 Elsevier Interactive Patient Education 2016 [employer]  

## 2015-11-28 DIAGNOSIS — E039 Hypothyroidism, unspecified: Secondary | ICD-10-CM | POA: Diagnosis not present

## 2016-01-16 DIAGNOSIS — R5383 Other fatigue: Secondary | ICD-10-CM | POA: Diagnosis not present

## 2016-01-16 DIAGNOSIS — E559 Vitamin D deficiency, unspecified: Secondary | ICD-10-CM | POA: Diagnosis not present

## 2016-01-16 DIAGNOSIS — N951 Menopausal and female climacteric states: Secondary | ICD-10-CM | POA: Diagnosis not present

## 2016-01-16 DIAGNOSIS — E039 Hypothyroidism, unspecified: Secondary | ICD-10-CM | POA: Diagnosis not present

## 2016-01-16 LAB — TSH: TSH: 5.02 u[IU]/mL (ref <=5.90)

## 2016-01-16 LAB — VITAMIN D 25 HYDROXY (VIT D DEFICIENCY, FRACTURES): Vit D, 25-Hydroxy: 43

## 2016-01-20 DIAGNOSIS — J209 Acute bronchitis, unspecified: Secondary | ICD-10-CM | POA: Diagnosis not present

## 2016-01-20 DIAGNOSIS — J00 Acute nasopharyngitis [common cold]: Secondary | ICD-10-CM | POA: Diagnosis not present

## 2016-01-20 DIAGNOSIS — R05 Cough: Secondary | ICD-10-CM | POA: Diagnosis not present

## 2016-01-20 DIAGNOSIS — J018 Other acute sinusitis: Secondary | ICD-10-CM | POA: Diagnosis not present

## 2016-01-30 DIAGNOSIS — R7982 Elevated C-reactive protein (CRP): Secondary | ICD-10-CM | POA: Diagnosis not present

## 2016-01-30 DIAGNOSIS — R5381 Other malaise: Secondary | ICD-10-CM | POA: Diagnosis not present

## 2016-01-30 DIAGNOSIS — G4700 Insomnia, unspecified: Secondary | ICD-10-CM | POA: Diagnosis not present

## 2016-01-30 DIAGNOSIS — E039 Hypothyroidism, unspecified: Secondary | ICD-10-CM | POA: Diagnosis not present

## 2016-03-16 ENCOUNTER — Telehealth: Payer: Self-pay | Admitting: Neurology

## 2016-03-16 NOTE — Telephone Encounter (Signed)
Patient went to pharmacy to get medication Gabapentin Enacarbil ER (HORIZANT) 300 MG TBCR and it cost $761.90. Her insurance is the same # except a higher deductible. Is there another medication that can be called in to replace this? Please call and discuss.

## 2016-03-16 NOTE — Telephone Encounter (Signed)
I called patient. The patient may come in to get a co-pay card that limits the co-pay for Horizant to $25. Hopefully, this will work with her insurance, if not, we may have to go to another medication.

## 2016-05-05 DIAGNOSIS — R1032 Left lower quadrant pain: Secondary | ICD-10-CM | POA: Diagnosis not present

## 2016-05-05 DIAGNOSIS — M549 Dorsalgia, unspecified: Secondary | ICD-10-CM | POA: Diagnosis not present

## 2016-05-06 ENCOUNTER — Other Ambulatory Visit: Payer: Self-pay | Admitting: Family Medicine

## 2016-05-06 DIAGNOSIS — N951 Menopausal and female climacteric states: Secondary | ICD-10-CM | POA: Diagnosis not present

## 2016-05-06 DIAGNOSIS — E559 Vitamin D deficiency, unspecified: Secondary | ICD-10-CM | POA: Diagnosis not present

## 2016-05-06 DIAGNOSIS — E039 Hypothyroidism, unspecified: Secondary | ICD-10-CM | POA: Diagnosis not present

## 2016-05-06 DIAGNOSIS — D241 Benign neoplasm of right breast: Secondary | ICD-10-CM

## 2016-05-06 DIAGNOSIS — R5383 Other fatigue: Secondary | ICD-10-CM | POA: Diagnosis not present

## 2016-05-15 ENCOUNTER — Ambulatory Visit
Admission: RE | Admit: 2016-05-15 | Discharge: 2016-05-15 | Disposition: A | Discharge status: Home / Self Care | Payer: BLUE CROSS/BLUE SHIELD | Source: Ambulatory Visit | Attending: Family Medicine | Admitting: Family Medicine

## 2016-05-15 DIAGNOSIS — D241 Benign neoplasm of right breast: Secondary | ICD-10-CM

## 2016-05-15 DIAGNOSIS — R922 Inconclusive mammogram: Secondary | ICD-10-CM | POA: Diagnosis not present

## 2016-05-18 ENCOUNTER — Ambulatory Visit (INDEPENDENT_AMBULATORY_CARE_PROVIDER_SITE_OTHER): Payer: BLUE CROSS/BLUE SHIELD | Admitting: Adult Health

## 2016-05-18 ENCOUNTER — Encounter: Payer: Self-pay | Admitting: Adult Health

## 2016-05-18 VITALS — BP 149/69 | HR 91 | Resp 20 | Ht 64.0 in | Wt 258.0 lb

## 2016-05-18 DIAGNOSIS — G2581 Restless legs syndrome: Secondary | ICD-10-CM

## 2016-05-18 NOTE — Progress Notes (Signed)
I have read the note, and I agree with the clinical assessment and plan.  WILLIS,CHARLES KEITH   

## 2016-05-18 NOTE — Progress Notes (Signed)
[name] DOB: 14-Jan-1966  REASON FOR VISIT: follow up- restless leg syndrome HISTORY FROM: patient  HISTORY OF PRESENT ILLNESS: [name] [name] is a 51 year old female with a history of restless leg syndrome. She returns today for follow-up. She is currently taking Requip XL 6 mg at bedtime as well as horizant 600 mg at bedtime. She reports in the past this has been working well for her. She states the last 6 weeks she has been sick. She states in the last week she is starting to feel better. She states that during this time she feels that her medication for restless legs was not working as well as it has been. She states that she will typically have trouble going to sleep and staying asleep due to restless leg symptoms. She does state that her stress at home has improved. Her and her husband has been doing marriage counseling and that has been beneficial for both of them. She states that at this time she does not want to adjust her medication. She returns today for an evaluation.  HISTORY 11/19/15: [name] [name] is a 51 year old right-handed white female with a history of restless leg syndrome. The patient has been under stress recently as she is separating from her husband. The patient had some difficulty with insomnia because of this. The patient had been having increased problems with restless leg syndrome but she recently went on several vitamins such as vitamin C, vitamin B12, vitamin D supplementation. The patient has noted a significant improvement in her symptoms with restless legs. The patient remains on Horizant, and she takes Requip in the extended release preparation, 6 mg daily. The patient indicates that this drug regimen has suppressed the restless legs symptoms throughout the day and evening. She returns for an evaluation.  REVIEW OF SYSTEMS: Out of a complete 14 system review of symptoms, the patient complains only of the following symptoms, and all other reviewed systems are  negative.  Appetite change, activity change, chills, fatigue, cough, wheezing, shortness of breath, restless leg, insomnia, nausea, vomiting, abdominal pain, swollen abdomen, excessive eating, excessive thirst, cold intolerance, urgency, urine decreased, back pain, agitation, decreased concentration, weakness, headache, swollen lymph nodes  ALLERGIES: Allergies  Allergen Reactions  . Bee Venom Swelling and Anaphylaxis  . Biaxin [Clarithromycin] Diarrhea and Rash  . Gabapentin     Nausea  . Abilify [Aripiprazole] Other (See Comments)    MOOD SWING  . Buspar [Buspirone] Diarrhea  . Lisinopril Cough and Other (See Comments)    Cough  . Maxzide [Triamterene-Hctz] Rash  . Phentermine Other (See Comments)    MOOD CHANGE   . Seroquel [Quetiapine Fumarate] Other (See Comments)    MOOD SWINGS    HOME MEDICATIONS: Outpatient Medications Prior to Visit  Medication Sig Dispense Refill  . buPROPion (WELLBUTRIN XL) 300 MG 24 hr tablet Take 600 mg by mouth daily.     . DULoxetine (CYMBALTA) 60 MG capsule Take 60 mg by mouth daily.    Marland Kitchen estradiol (ESTRACE) 1 MG tablet Take 1 mg by mouth daily.  2  . Gabapentin Enacarbil ER (HORIZANT) 300 MG TBCR TAKE 2 TABLETS BY MOUTH EVERY DAY AT 5PM WITH FOOD 60 tablet 11  . meloxicam (MOBIC) 15 MG tablet TAKE 1 TABLET BY MOUTH EVERY DAY AS NEEDED FOR PAIN  1  . NATURE-THROID 48.75 MG TABS TAKE 1.5 TABLETS EVERY MORNING ON AN EMPTY STOMACH  2  . progesterone (PROMETRIUM) 100 MG capsule Take 200 mg by mouth at  bedtime.  2  . rOPINIRole (REQUIP XL) 2 MG 24 hr tablet Take 1 tablet (2 mg total) by mouth daily. 30 tablet 11  . rOPINIRole (REQUIP XL) 4 MG 24 hr tablet Take 1 tablet (4 mg total) by mouth at bedtime. 30 tablet 11  . telmisartan-hydrochlorothiazide (MICARDIS HCT) 80-25 MG tablet Take 1 tablet by mouth daily.  3  . TURMERIC PO Take 500 mg by mouth 2 (two) times daily.    . Ascorbic Acid (VITAMIN C PO) Take 520 mg by mouth daily.    Marland Kitchen BIOTIN PO Take  5,000 mcg by mouth daily.    . Cholecalciferol (VITAMIN D3 ULTRA POTENCY) 02585 units TABS Take 2 tablets by mouth daily.    . COLLAGEN PO Take 6,000 mg by mouth 3 (three) times daily.    . Cyanocobalamin (B-12) 5000 MCG SUBL Place under the tongue.    . Menaquinone-7 (VITAMIN K2 PO) Take 150 mcg by mouth daily.    . Misc Natural Products (TART CHERRY ADVANCED PO) Take 2 capsules by mouth daily.    . Pregnenolone Micronized POWD Take 75 mg by mouth daily.    . Probiotic Product (PROBIOTIC PO) Take by mouth 2 (two) times daily.     No facility-administered medications prior to visit.     PAST MEDICAL HISTORY: Past Medical History:  Diagnosis Date  . Anxiety 01/12/2013  . Depression   . Dyslipidemia   . HTN (hypertension)   . Hypothyroidism   . Migraine   . Obesity   . RLS (restless legs syndrome)     PAST SURGICAL HISTORY: Past Surgical History:  Procedure Laterality Date  . BACK SURGERY  2016  . BREAST BIOPSY     x 2 benign lesion  . CERVICAL DISCECTOMY  2003  . fusion  2009   L2-L3  . Hand fracture surgery    . NECK SURGERY  2010  . VAGINAL HYSTERECTOMY  2009    FAMILY HISTORY: Family History  Problem Relation Age of Onset  . Hypertension Father   . Diabetes Father   . Diabetes Brother     SOCIAL HISTORY: Social History   Social History  . Marital status: Married    Spouse name: [name]  . Number of children: 2  . Years of education: COLLEGE   Occupational History  . 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 Smoking status: Never Smoker  . Smokeless tobacco: Never Used  . Alcohol use No  . Drug use: No  . Sexual activity: Not on file   Other Topics Concern  . Not on file   Social History Narrative   Patient is married [name])  2 children.   Patient is right handed.   Patient has a college education.   Patient drinks 3 cups daily.      PHYSICAL EXAM  Vitals:   05/18/16 0817  BP: (!) 149/69  Pulse: 91  Resp: 20  Weight:  258 lb (117 kg)  Height: 5\' 4"  (1.626 m)   Body mass index is 44.29 kg/m.  Generalized: Well developed, in no acute distress   Neurological examination  Mentation: Alert oriented to time, place, history taking. Follows all commands speech and language fluent Cranial nerve II-XII: Pupils were equal round reactive to light. Extraocular movements were full, visual field were full on confrontational test. Facial sensation and strength were normal. Uvula tongue midline. Head turning and shoulder shrug  were normal and symmetric. Motor: The motor testing reveals  5 over 5 strength of all 4 extremities. Good symmetric motor tone is noted throughout.  Sensory: Sensory testing is intact to soft touch on all 4 extremities. No evidence of extinction is noted.  Coordination: Cerebellar testing reveals good finger-nose-finger and heel-to-shin bilaterally.  Gait and station: Gait is normal. Tandem gait is normal. Romberg is negative. No drift is seen.  Reflexes: Deep tendon reflexes are symmetric and normal bilaterally.   DIAGNOSTIC DATA (LABS, IMAGING, TESTING) - I reviewed patient records, labs, notes, testing and imaging myself where available.  Lab Results  Component Value Date   WBC 8.6 11/11/2010   HGB 13.7 11/11/2010   HCT 40.2 11/11/2010   MCV 88.9 11/11/2010   PLT 289 11/11/2010      Component Value Date/Time   NA 140 11/11/2010 0956   K 3.8 11/11/2010 0956   CL 102 11/11/2010 0956   CO2 29 11/11/2010 0956   GLUCOSE 110 (H) 11/11/2010 0956   BUN 15 11/11/2010 0956   CREATININE 0.61 11/11/2010 0956   CALCIUM 9.9 11/11/2010 0956   PROT 6.0 02/09/2009 0740   ALBUMIN 3.2 (L) 02/09/2009 0740   AST 21 02/09/2009 0740   ALT 27 02/09/2009 0740   ALKPHOS 52 02/09/2009 0740   BILITOT 0.8 02/09/2009 0740   GFRNONAA >60 11/11/2010 0956   GFRAA >60 11/11/2010 0956   Lab Results  Component Value Date   CHOL  02/09/2009    143        ATP III CLASSIFICATION:  <200     mg/dL    Desirable  200-239  mg/dL   Borderline High  >=240    mg/dL   High          HDL 35 (L) 02/09/2009   LDLCALC  02/09/2009    59        Total Cholesterol/HDL:CHD Risk Coronary Heart Disease Risk Table                     Men   Women  1/2 Average Risk   3.4   3.3  Average Risk       5.0   4.4  2 X Average Risk   9.6   7.1  3 X Average Risk  23.4   11.0        Use the calculated Patient Ratio above and the CHD Risk Table to determine the patient's CHD Risk.        ATP III CLASSIFICATION (LDL):  <100     mg/dL   Optimal  100-129  mg/dL   Near or Above                    Optimal  130-159  mg/dL   Borderline  160-189  mg/dL   High  >190     mg/dL   Very High   TRIG 247 (H) 02/09/2009   CHOLHDL 4.1 02/09/2009       ASSESSMENT AND PLAN 51 y.o. year old female  has a past medical history of Anxiety (01/12/2013); Depression; Dyslipidemia; HTN (hypertension); Hypothyroidism; Migraine; Obesity; and RLS (restless legs syndrome). here with:  1. Restless leg syndrome  The patient will remain on Requip XL 6 mg at bedtime and Horizant 600 mg at bedtime. The patient does feel that in the last several weeks her symptoms have worsened. However at this time she does not want to adjust her medication. She feels that this may be related to her not feeling well. She states  that if her symptoms does not improve she will let us know. She will follow-up in 6 months or sooner if needed.    Ward Givens, MSN, NP-C 05/18/2016, 8:15 AM [facility] [address], [place], [place] [address] [phone]

## 2016-05-18 NOTE — Patient Instructions (Signed)
Continue Requip and Horizant If your symptoms worsen or you develop new symptoms please let us know.

## 2016-05-23 DIAGNOSIS — J209 Acute bronchitis, unspecified: Secondary | ICD-10-CM | POA: Diagnosis not present

## 2016-05-26 DIAGNOSIS — R5381 Other malaise: Secondary | ICD-10-CM | POA: Diagnosis not present

## 2016-05-26 DIAGNOSIS — R5383 Other fatigue: Secondary | ICD-10-CM | POA: Diagnosis not present

## 2016-05-26 DIAGNOSIS — R7982 Elevated C-reactive protein (CRP): Secondary | ICD-10-CM | POA: Diagnosis not present

## 2016-05-26 DIAGNOSIS — E039 Hypothyroidism, unspecified: Secondary | ICD-10-CM | POA: Diagnosis not present

## 2016-07-07 ENCOUNTER — Telehealth: Payer: Self-pay | Admitting: *Deleted

## 2016-07-07 ENCOUNTER — Encounter: Payer: Self-pay | Admitting: Family Medicine

## 2016-07-07 ENCOUNTER — Ambulatory Visit (INDEPENDENT_AMBULATORY_CARE_PROVIDER_SITE_OTHER): Payer: BLUE CROSS/BLUE SHIELD | Admitting: Family Medicine

## 2016-07-07 VITALS — BP 124/84 | HR 83 | Temp 98.8°F | Resp 20 | Ht 64.0 in | Wt 254.5 lb

## 2016-07-07 DIAGNOSIS — M2550 Pain in unspecified joint: Secondary | ICD-10-CM

## 2016-07-07 DIAGNOSIS — Z973 Presence of spectacles and contact lenses: Secondary | ICD-10-CM

## 2016-07-07 DIAGNOSIS — Z0000 Encounter for general adult medical examination without abnormal findings: Secondary | ICD-10-CM

## 2016-07-07 DIAGNOSIS — R7309 Other abnormal glucose: Secondary | ICD-10-CM | POA: Diagnosis not present

## 2016-07-07 DIAGNOSIS — F339 Major depressive disorder, recurrent, unspecified: Secondary | ICD-10-CM

## 2016-07-07 DIAGNOSIS — Z6841 Body Mass Index (BMI) 40.0 and over, adult: Secondary | ICD-10-CM | POA: Diagnosis not present

## 2016-07-07 DIAGNOSIS — E785 Hyperlipidemia, unspecified: Secondary | ICD-10-CM

## 2016-07-07 DIAGNOSIS — I10 Essential (primary) hypertension: Secondary | ICD-10-CM

## 2016-07-07 DIAGNOSIS — E039 Hypothyroidism, unspecified: Secondary | ICD-10-CM

## 2016-07-07 DIAGNOSIS — G2581 Restless legs syndrome: Secondary | ICD-10-CM

## 2016-07-07 MED ORDER — BUPROPION HCL ER (XL) 300 MG PO TB24: 600.0000 mg | ORAL_TABLET | Freq: Every day | ORAL | 1 refills | Status: DC

## 2016-07-07 MED ORDER — DULOXETINE HCL 60 MG PO CPEP: 60.0000 mg | ORAL_CAPSULE | Freq: Every day | ORAL | 1 refills | Status: DC

## 2016-07-07 MED ORDER — MELOXICAM 15 MG PO TABS: ORAL_TABLET | ORAL | 1 refills | Status: DC

## 2016-07-07 MED ORDER — BUPROPION HCL ER (XL) 300 MG PO TB24: 300.0000 mg | ORAL_TABLET | Freq: Every day | ORAL | 1 refills | Status: DC

## 2016-07-07 MED ORDER — TELMISARTAN-HCTZ 80-25 MG PO TABS: 1.0000 | ORAL_TABLET | Freq: Every day | ORAL | 1 refills | Status: DC

## 2016-07-07 NOTE — Patient Instructions (Signed)
Nice to meet you today!!  Every 6 months for chronic medical conditions.  Every year for physical.   I will review all labs and records and contact you if we need to discuss anything.   Please help [place] help you:  We are honored you have chosen [facility] for your [facility] Care home. Below you will find basic instructions that you may need to access in the future. Please help [place] help you by reading the instructions, which cover many of the frequent questions we experience.   Prescription refills and request:  -In order to allow more efficient response time, please call your pharmacy for all refills. They will forward the request electronically to [place]. This allows for the quickest possible response. Request left on a nurse line can take longer to refill, since these are checked as time allows between office patients and other phone calls.  - refill request can take up to 3-5 working days to complete.  - If request is sent electronically and request is appropiate, it is usually completed in 1-2 business days.  - all patients will need to be seen routinely for all chronic medical conditions requiring prescription medications (see follow-up below). If you are overdue for follow up on your condition, you will be asked to make an appointment and we will call in enough medication to cover you until your appointment (up to 30 days).  - all controlled substances will require a face to face visit to request/refill.  - if you desire your prescriptions to go through a new pharmacy, and have an active script at original pharmacy, you will need to call your pharmacy and have scripts transferred to new pharmacy. This is completed between the pharmacy locations and not by your provider.    Results: If any images or labs were ordered, it can take up to 1 week to get results depending on the test ordered and the lab/facility running and resulting the test. - Normal or stable results, which do not need further  discussion, may be released to your mychart immediately with attached note to you. A call may not be generated for normal results. Please make certain to sign up for mychart. If you have questions on how to activate your mychart you can call the front office.  - If your results need further discussion, our office will attempt to contact you via phone, and if unable to reach you after 2 attempts, we will release your abnormal result to your mychart with instructions.  - All results will be automatically released in mychart after 1 week.  - Your provider will provide you with explanation and instruction on all relevant material in your results. Please keep in mind, results and labs may appear confusing or abnormal to the untrained eye, but it does not mean they are actually abnormal for you personally. If you have any questions about your results that are not covered, or you desire more detailed explanation than what was provided, you should make an appointment with your provider to do so.   Our office handles many outgoing and incoming calls daily. If we have not contacted you within 1 week about your results, please check your mychart to see if there is a message first and if not, then contact our office.  In helping with this matter, you help decrease call volume, and therefore allow [place] to be able to respond to patients needs more efficiently.   Acute office visits (sick visit):  An acute visit  is intended for a new problem and are scheduled in shorter time slots to allow schedule openings for patients with new problems. This is the appropriate visit to discuss a new problem. In order to provide you with excellent quality medical care with proper time for you to explain your problem, have an exam and receive treatment with instructions, these appointments should be limited to one new problem per visit. If you experience a new problem, in which you desire to be addressed, please make an acute office visit,  we save openings on the schedule to accommodate you. Please do not save your new problem for any other type of visit, let us take care of it properly and quickly for you.   Follow up visits:  Depending on your condition(s) your provider will need to see you routinely in order to provide you with quality care and prescribe medication(s). Most chronic conditions (Example: hypertension, Diabetes, depression/anxiety... etc), require visits a couple times a year. Your provider will instruct you on proper follow up for your personal medical conditions and history. Please make certain to make follow up appointments for your condition as instructed. Failing to do so could result in lapse in your medication treatment/refills. If you request a refill, and are overdue to be seen on a condition, we will always provide you with a 30 day script (once) to allow you time to schedule.    Medicare wellness (well visit): - we have a wonderful Nurse Maudie Mercury), that will meet with you and provide you will yearly medicare wellness visits. These visits should occur yearly (can not be scheduled less than 1 calendar year apart) and cover preventive health, immunizations, advance directives and screenings you are entitled to yearly through your medicare benefits. Do not miss out on your entitled benefits, this is when medicare will pay for these benefits to be ordered for you.  These are strongly encouraged by your provider and is the appropriate type of visit to make certain you are up to date with all preventive health benefits. If you have not had your medicare wellness exam in the last 12 months, please make certain to schedule one by calling the office and schedule your medicare wellness with Maudie Mercury as soon as possible.   Yearly physical (well visit):  - Adults are recommended to be seen yearly for physicals. Check with your insurance and date of your last physical, most insurances require one calendar year between physicals.  Physicals include all preventive health topics, screenings, medical exam and labs that are appropriate for gender/age and history. You may have fasting labs needed at this visit. This is a well visit (not a sick visit), new problems should not be covered during this visit (see acute visit).  - Pediatric patients are seen more frequently when they are younger. Your provider will advise you on well child visit timing that is appropriate for your their age. - This is not a medicare wellness visit. Medicare wellness exams do not have an exam portion to the visit. Some medicare companies allow for a physical, some do not allow a yearly physical. If your medicare allows a yearly physical you can schedule the medicare wellness with our nurse Maudie Mercury and have your physical with your provider after, on the same day. Please check with insurance for your full benefits.   Late Policy/No Shows:  - all new patients should arrive 15-30 minutes earlier than appointment to allow [place] time  to  obtain all personal demographics,  insurance information and  for you to complete office paperwork. - All established patients should arrive 10-15 minutes earlier than appointment time to update all information and be checked in .  - In our best efforts to run on time, if you are late for your appointment you will be asked to either reschedule or if able, we will work you back into the schedule. There will be a wait time to work you back in the schedule,  depending on availability.  - If you are unable to make it to your appointment as scheduled, please call 24 hours ahead of time to allow [place] to fill the time slot with someone else who needs to be seen. If you do not cancel your appointment ahead of time, you may be charged a no show fee.

## 2016-07-07 NOTE — Progress Notes (Signed)
Patient ID: [name] [name], female  DOB: [birth year], 51 y.o.   MRN: [id number] Patient Care Team    Relationship Specialty Notifications Start End  [name], DO PCP - General Family Medicine  07/07/16   Ob/Gyn, [name]    07/08/16   Wilford Corner, MD Consulting Physician Gastroenterology  07/08/16   [name], MD Consulting Physician Neurology  07/08/16   [name], MD Attending Physician Endocrinology  07/08/16    Comment: Pt established with Si Raider, NP at this location. - Integrative med- thyroid    Chief Complaint  Patient presents with  . Establish Care    Subjective:  [name] [name] is a 51 y.o.  female present for new patient establishment. All past medical history, surgical history, allergies, family history, immunizations, medications and social history were obtained and entered in the electronic medical record today. All recent labs, ED visits and hospitalizations within the last year were reviewed.  Depression/anxiety: She reports she has never really been depressed. She was diagnosed with "serotonin deficiency "approximately 15 years ago. She is currently prescribed Wellbutrin 300 mg daily and Cymbalta 60 mg daily. She states she feels good on this medication. She will need refills today.  Arthralgia: Patient reports that she has pain in "all of her joints ". She feels her knees are worse. She is prescribed meloxicam 50 mg daily. She tolerates this medication well. She reports with use of this medication her discomfort is tolerable. She will need refills on his medication today.  Hypertension: Patient reports compliance with telmisartan/HCTZ 80-25 milligrams daily. Her blood pressures have been well-controlled. She does not exercise routinely.  hypothyroidism: Patient has had abnormal TSH and T4 in 2017. She had some dose changes to her Synthroid at that time, she is now seeing integrative medicine nurse practitioner at [facility]. She is now on nature thyroid 48.75. She is also prescribed estradiol/progesterone through integrative medicine.  Restless leg syndrome: Patient is established with Dr. Jannifer Franklin who prescribes gabapentin and Requip for her restless leg syndrome.  Health maintenance:  Colonoscopy: No family history of colon cancer, liver cancer history in her mother. Patient reports colonoscopy 04/02/2014. Mammogram: completed: 05/15/2016, birads 2, benign.  Cervical cancer screening: hysterectomy. Established with [facility]. Immunizations: tdap 04/20/11, Influenza (encouraged yearly) zostavax will offer at CPE Infectious disease screening: HIV unknown, will await records and offer at [facility] if indicated.  DEXA: unknown.   Outside labs/images:  09/28/2015 Vitamin D 26.89  C-reactive protein, cardiac 5.39 elevated at high risk. B 12 293 TSH 5.18, normal T4 free, normal thyroid clothing antibody, normal T3.  04/03/2015. Elevated hemoglobin A1c of 5.9 (09/08/14--> 6.2) Lipid panel within normal limits. CMP within normal limits CBC within normal limits MRI Lumbar spine 2012: IMPRESSION:  1. Interval worsening of the degenerative changes at L3-L4 and L4-L5 since July 2012.Of note there is now moderate to severe central canal stenosis at L4-L5. 2. Interval surgical changes of interbody and posterior spinal fusion at L2-L3 with improvement of the central canal stenosis at this level.  Depression screen PHQ 2/9 07/07/2016  Decreased Interest 0  Down, Depressed, Hopeless 0  PHQ - 2 Score 0   No flowsheet data found.  Current Exercise Habits: The patient does not participate in regular exercise at present Exercise limited by: None identified Fall Risk  07/07/2016  Falls in the past year? No      There is no immunization history on file for  this patient.  No exam data present  Past Medical History:  Diagnosis Date  . Anxiety 01/12/2013  . Asthma   . Chicken pox   . Clostridium difficile  infection 04/02/2014, 02/06/2014  . Depression   . Dyslipidemia   . HTN (hypertension)   . Hypothyroidism   . Migraine   . Multiple allergies   . Obesity   . RLS (restless legs syndrome)    Allergies  Allergen Reactions  . Bee Venom Swelling and Anaphylaxis  . Biaxin [Clarithromycin] Diarrhea and Rash  . Gabapentin     Nausea  . Abilify [Aripiprazole] Other (See Comments)    MOOD SWING  . Buspar [Buspirone] Diarrhea  . Lisinopril Cough and Other (See Comments)    Cough  . Maxzide [Triamterene-Hctz] Rash  . Phentermine Other (See Comments)    MOOD CHANGE   . Seroquel [Quetiapine Fumarate] Other (See Comments)    MOOD SWINGS   Past Surgical History:  Procedure Laterality Date  . BREAST BIOPSY  4098,1191   x 2 benign lesion  . CERVICAL DISCECTOMY  2003  . CERVICAL FUSION  2010   x2  . Hand fracture surgery    . LUMBAR FUSION  2016  . VAGINAL HYSTERECTOMY  2009   Family History  Problem Relation Age of Onset  . Hypertension Father   . Diabetes Father   . Heart disease Father   . Hearing loss Father   . Diabetes Brother   . Arthritis Mother   . Liver cancer Mother   . COPD Mother   . Mental illness Mother   . Diabetes Mother   . Hearing loss Mother   . Heart disease Mother    Social History   Social History  . Marital status: Married    Spouse name: [name] [name]  . Number of children: 2  . Years of education: COLLEGE   Occupational History  . 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 Smoking status: Never Smoker  . Smokeless tobacco: Never Used  . Alcohol use No  . Drug use: No  . Sexual activity: Yes    Partners: Male    Birth control/ protection: None   Other Topics Concern  . Not on file   Social History Narrative   Patient is married [name] [name])  2 children [name] [name] and [name] [name])   Patient is right handed.   Patient has a college education. 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    Patient drinks 1 cups daily. Uses herbal remedies, takes a daily  vitamin.   Wears her seatbelt, smoke detector in the home.   Allergies as of 07/07/2016      Reactions   Bee Venom Swelling, Anaphylaxis   Biaxin [clarithromycin] Diarrhea, Rash   Gabapentin    Nausea   Abilify [aripiprazole] Other (See Comments)   MOOD SWING   Buspar [buspirone] Diarrhea   Lisinopril Cough, Other (See Comments)   Cough   Maxzide [triamterene-hctz] Rash   Phentermine Other (See Comments)   MOOD CHANGE   Seroquel [quetiapine Fumarate] Other (See Comments)   MOOD SWINGS      Medication List       Accurate as of 07/07/16 11:59 PM. Always use your most recent med list.          buPROPion 300 MG 24 hr tablet Commonly known as:  WELLBUTRIN XL Take 1 tablet (300 mg total) by mouth daily.   DULoxetine 60 MG capsule Commonly known as:  CYMBALTA Take 1 capsule (  60 mg total) by mouth daily.   estradiol 1 MG tablet Commonly known as:  ESTRACE Take 1 mg by mouth daily.   Gabapentin Enacarbil ER 300 MG Tbcr Commonly known as:  HORIZANT TAKE 2 TABLETS BY MOUTH EVERY DAY AT 5PM WITH FOOD   meloxicam 15 MG tablet Commonly known as:  MOBIC TAKE 1 TABLET BY MOUTH EVERY DAY AS NEEDED FOR PAIN   NATURE-THROID 48.75 MG Tabs Generic drug:  Thyroid TAKE 2 TABLETS EVERY MORNING ON AN EMPTY STOMACH   progesterone 100 MG capsule Commonly known as:  PROMETRIUM Take 100 mg by mouth at bedtime.   rOPINIRole 4 MG 24 hr tablet Commonly known as:  REQUIP XL Take 1 tablet (4 mg total) by mouth at bedtime.   rOPINIRole 2 MG 24 hr tablet Commonly known as:  REQUIP XL Take 1 tablet (2 mg total) by mouth daily.   telmisartan-hydrochlorothiazide 80-25 MG tablet Commonly known as:  MICARDIS HCT Take 1 tablet by mouth daily.       All past medical history, surgical history, allergies, family history, immunizations andmedications were updated in the EMR today and reviewed under the history and medication portions of their EMR.    Recent Results (from the past 2160 hour(s))   TSH     Status: None   Collection Time: 07/08/16 12:00 AM  Result Value Ref Range   TSH 5.18 0.41 - 5.90 uIU/mL    Mm Diag Breast Tomo Bilateral  Result Date: 05/15/2016 CLINICAL DATA:  Followup for probably benign findings in the right breast. History of prior benign bilateral breast excisions. EXAM: 2D DIGITAL DIAGNOSTIC BILATERAL MAMMOGRAM WITH CAD AND ADJUNCT TOMO COMPARISON:  Previous exam(s). ACR Breast Density Category c: The breast tissue is heterogeneously dense, which may obscure small masses. FINDINGS: No suspicious masses or calcifications are seen in either breast. Oval circumscribed nodules in the right breast appear stable mammographically dating back to 01/06/2010. There is no mammographic evidence of malignancy in either breast. Mammographic images were processed with CAD. IMPRESSION: No mammographic evidence of malignancy in either breast. RECOMMENDATION: Screening mammogram in one year.(Code:SM-B-01Y) I have discussed the findings and recommendations with the patient. Results were also provided in writing at the conclusion of the visit. If applicable, a reminder letter will be sent to the patient regarding the next appointment. BI-RADS CATEGORY  2: Benign. Electronically Signed   By: Everlean Alstrom M.D.   On: 05/15/2016 11:58     ROS: 14 pt review of systems performed and negative (unless mentioned in an HPI)  Objective: BP 124/84 (BP Location: Right Arm, Patient Position: Sitting, Cuff Size: Large)   Pulse 83   Temp 98.8 F (37.1 C)   Resp 20   Ht 5\' 4"  (1.626 m)   Wt 254 lb 8 oz (115.4 kg)   SpO2 96%   BMI 43.68 kg/m  Gen: Afebrile. No acute distress. Nontoxic in appearance, well-developed, well-nourished,  obese Caucasian female. HENT: AT. [place]. MMM, no oral lesions, adequate dentition.  Eyes:Pupils Equal Round Reactive to light, Extraocular movements intact,  Conjunctiva without redness, discharge or icterus. Neck/lymp/endocrine: Supple,  no thyromegaly CV: RRR  no murmur, no edema Chest: CTAB, no wheeze, rhonchi or crackles.  Abd: Soft. NTND. BS present. Skin: Warm and well-perfused. Skin intact. Neuro/Msk:  Normal gait. PERLA. EOMi. Alert. Oriented x3.   Psych: Normal affect, dress and demeanor. Normal speech. Normal thought content and judgment.  Assessment/plan: ZEPPELIN BECKSTRAND is a 51 y.o. female present for establishment of  care. Elevated hemoglobin A1c - Per electronic medical review, to elevated hemoglobins A1c is within the last year. Will monitor on her next physical or chronic medical condition appointment.  Essential hypertension Morbid obesity (HCC) BMI 40.0-44.9, adult ([place]) Blood pressure is stable today. Refills on telmisartan/HCTZ 80-25 mg. Low sodium diet, increase exercise greater than 150 minutes a week. BMP within the last year normal. Follow-up in 6 months.  Acquired hypothyroidism Follows with [facility] for thyroid and hormone replacement.  Dyslipidemia Last lipids appear normal, currently not on medication. Will follow yearly.  Recurrent major depressive disorder, remission status unspecified ([place]) Patient reports she was diagnosed with "serotonin syndrome ". She is doing well on Wellbutrin 300 mg daily and Cymbalta 60 mg daily. Will refill these medications for her today. Follow-up in 6 months  Arthralgia, unspecified joint Diffuse arthralgia, patient reports good response to meloxicam 50 mg daily. Refills provided today for 1 year. BMP normal.  Wears glasses Follows routinely [facility] optometrist [facility] in [place].  Restless legs syndrome (RLS) Follows with Dr. Jannifer Franklin, neuro. Is prescribe Requip and gabapentin through his office.  Return in about 6 months (around 01/07/2017) for htn/depression.  Greater than 45 minutes was spent with patient, greater than 50% of that time was spent face-to-face with patient counseling and/or coordinating care.    Note is dictated utilizing voice  recognition software. Although note has been proof read prior to signing, occasional typographical errors still can be missed. If any questions arise, please do not hesitate to call for verification.  Electronically signed by: Howard Pouch, DO [place]

## 2016-07-07 NOTE — Telephone Encounter (Signed)
Patient called and states she is only taking wellbutrin 24 hr 300mg  tab one tab daily correction made in chart . Spoke to [place] at [facility] corrected RX.

## 2016-07-08 ENCOUNTER — Encounter: Payer: Self-pay | Admitting: *Deleted

## 2016-07-08 ENCOUNTER — Encounter: Payer: Self-pay | Admitting: Family Medicine

## 2016-07-08 DIAGNOSIS — R7309 Other abnormal glucose: Secondary | ICD-10-CM

## 2016-07-08 DIAGNOSIS — Z6841 Body Mass Index (BMI) 40.0 and over, adult: Secondary | ICD-10-CM | POA: Insufficient documentation

## 2016-07-08 DIAGNOSIS — I10 Essential (primary) hypertension: Secondary | ICD-10-CM

## 2016-07-08 DIAGNOSIS — E039 Hypothyroidism, unspecified: Secondary | ICD-10-CM | POA: Insufficient documentation

## 2016-07-08 DIAGNOSIS — M2550 Pain in unspecified joint: Secondary | ICD-10-CM | POA: Insufficient documentation

## 2016-07-08 DIAGNOSIS — Z973 Presence of spectacles and contact lenses: Secondary | ICD-10-CM | POA: Insufficient documentation

## 2016-07-08 DIAGNOSIS — F329 Major depressive disorder, single episode, unspecified: Secondary | ICD-10-CM | POA: Insufficient documentation

## 2016-07-08 DIAGNOSIS — F32A Depression, unspecified: Secondary | ICD-10-CM | POA: Insufficient documentation

## 2016-07-08 DIAGNOSIS — E785 Hyperlipidemia, unspecified: Secondary | ICD-10-CM | POA: Insufficient documentation

## 2016-07-08 HISTORY — DX: Morbid (severe) obesity due to excess calories: E66.01

## 2016-07-08 HISTORY — DX: Essential (primary) hypertension: I10

## 2016-07-08 HISTORY — DX: Presence of spectacles and contact lenses: Z97.3

## 2016-07-08 HISTORY — DX: Other abnormal glucose: R73.09

## 2016-07-08 HISTORY — DX: Pain in unspecified joint: M25.50

## 2016-07-08 LAB — TSH: TSH: 5.18 u[IU]/mL (ref <=5.90)

## 2016-08-26 DIAGNOSIS — M1712 Unilateral primary osteoarthritis, left knee: Secondary | ICD-10-CM | POA: Diagnosis not present

## 2016-08-26 DIAGNOSIS — M25562 Pain in left knee: Secondary | ICD-10-CM | POA: Diagnosis not present

## 2016-08-26 DIAGNOSIS — M25561 Pain in right knee: Secondary | ICD-10-CM | POA: Diagnosis not present

## 2016-08-26 DIAGNOSIS — M7702 Medial epicondylitis, left elbow: Secondary | ICD-10-CM | POA: Diagnosis not present

## 2016-08-26 DIAGNOSIS — M25522 Pain in left elbow: Secondary | ICD-10-CM | POA: Diagnosis not present

## 2016-09-04 ENCOUNTER — Other Ambulatory Visit (HOSPITAL_COMMUNITY): Payer: Self-pay | Admitting: Diagnostic Radiology

## 2016-09-08 ENCOUNTER — Other Ambulatory Visit: Payer: Self-pay | Admitting: Family Medicine

## 2016-09-08 ENCOUNTER — Other Ambulatory Visit (HOSPITAL_COMMUNITY): Payer: Self-pay | Admitting: Diagnostic Radiology

## 2016-09-08 DIAGNOSIS — M25561 Pain in right knee: Secondary | ICD-10-CM

## 2016-09-09 ENCOUNTER — Ambulatory Visit (INDEPENDENT_AMBULATORY_CARE_PROVIDER_SITE_OTHER): Payer: BLUE CROSS/BLUE SHIELD | Admitting: Family Medicine

## 2016-09-09 ENCOUNTER — Encounter: Payer: Self-pay | Admitting: Family Medicine

## 2016-09-09 VITALS — BP 124/80 | HR 73 | Temp 98.8°F | Resp 20 | Wt 250.2 lb

## 2016-09-09 DIAGNOSIS — M7712 Lateral epicondylitis, left elbow: Secondary | ICD-10-CM | POA: Diagnosis not present

## 2016-09-09 MED ORDER — PREDNISONE 20 MG PO TABS: ORAL_TABLET | ORAL | 0 refills | Status: DC

## 2016-09-09 NOTE — Progress Notes (Signed)
[name] [name] , [birth year], 51 y.o., female MRN: [id number] Patient Care Team    Relationship Specialty Notifications Start End  [name], DO PCP - General Family Medicine  07/07/16   Ob/Gyn, [name]    07/08/16   Wilford Corner, MD Consulting Physician Gastroenterology  07/08/16   [name], MD Consulting Physician Neurology  07/08/16   [name], MD Attending Physician Endocrinology  07/08/16    Comment: Pt established with Si Raider, NP at this location. - Integrative med- thyroid    Chief Complaint  Patient presents with  . Elbow Pain    left x 2 weeks     Subjective: Pt presents for an OV with complaints of left elbow pain  of 2 weeks  duration.  Associated symptoms include pain down her arm from her elbow intermittently, and weak grip. Pt has tried nothing to ease their symptoms. She is currently unable to take anti-inflammatories secondary to recent platelet knee injection. She denies injury or repetitive movement. She does admit to stopping the meloxicam daily use right around the time this started. She denies fevers chills nausea or vomiting. She is right-handed.  Depression screen PHQ 2/9 07/07/2016  Decreased Interest 0  Down, Depressed, Hopeless 0  PHQ - 2 Score 0    Allergies  Allergen Reactions  . Bee Venom Swelling and Anaphylaxis  . Biaxin [Clarithromycin] Diarrhea and Rash  . Gabapentin     Nausea  . Abilify [Aripiprazole] Other (See Comments)    MOOD SWING  . Buspar [Buspirone] Diarrhea  . Lisinopril Cough and Other (See Comments)    Cough  . Maxzide [Triamterene-Hctz] Rash  . Phentermine Other (See Comments)    MOOD CHANGE   . Seroquel [Quetiapine Fumarate] Other (See Comments)    MOOD SWINGS   Social History  Substance Use Topics  . Smoking status: Never Smoker  . Smokeless tobacco: Never Used  . Alcohol use No   Past Medical History:  Diagnosis Date  . Anxiety 01/12/2013  . Asthma   . Chicken pox   . Clostridium  difficile infection 04/02/2014, 02/06/2014  . Depression   . Dyslipidemia   . HTN (hypertension)   . Hypothyroidism   . Migraine   . Multiple allergies   . Obesity   . RLS (restless legs syndrome)    Past Surgical History:  Procedure Laterality Date  . BREAST BIOPSY  [id number]   x 2 benign lesion  . CERVICAL DISCECTOMY  2003  . CERVICAL FUSION  2010   x2  . Hand fracture surgery    . LUMBAR FUSION  2016  . VAGINAL HYSTERECTOMY  2009   Family History  Problem Relation Age of Onset  . Hypertension Father   . Diabetes Father   . Heart disease Father   . Hearing loss Father   . Diabetes Brother   . Arthritis Mother   . Liver cancer Mother   . COPD Mother   . Mental illness Mother   . Diabetes Mother   . Hearing loss Mother   . Heart disease Mother    Allergies as of 09/09/2016      Reactions   Bee Venom Swelling, Anaphylaxis   Biaxin [clarithromycin] Diarrhea, Rash   Gabapentin    Nausea   Abilify [aripiprazole] Other (See Comments)   MOOD SWING   Buspar [buspirone] Diarrhea   Lisinopril Cough, Other (See Comments)   Cough   Maxzide [triamterene-hctz] Rash   Phentermine Other (See  Comments)   MOOD CHANGE   Seroquel [quetiapine Fumarate] Other (See Comments)   MOOD SWINGS      Medication List       Accurate as of 09/09/16 10:09 AM. Always use your most recent med list.          acetaminophen 500 MG tablet Commonly known as:  TYLENOL Take 1,000 mg by mouth daily.   buPROPion 300 MG 24 hr tablet Commonly known as:  WELLBUTRIN XL Take 1 tablet (300 mg total) by mouth daily.   DULoxetine 60 MG capsule Commonly known as:  CYMBALTA Take 1 capsule (60 mg total) by mouth daily.   estradiol 1 MG tablet Commonly known as:  ESTRACE Take 1 mg by mouth daily.   Gabapentin Enacarbil ER 300 MG Tbcr Commonly known as:  HORIZANT TAKE 2 TABLETS BY MOUTH EVERY DAY AT 5PM WITH FOOD   NATURE-THROID 48.75 MG Tabs Generic drug:  Thyroid TAKE 2 TABLETS EVERY  MORNING ON AN EMPTY STOMACH   progesterone 100 MG capsule Commonly known as:  PROMETRIUM Take 100 mg by mouth at bedtime.   rOPINIRole 4 MG 24 hr tablet Commonly known as:  REQUIP XL Take 1 tablet (4 mg total) by mouth at bedtime.   rOPINIRole 2 MG 24 hr tablet Commonly known as:  REQUIP XL Take 1 tablet (2 mg total) by mouth daily.   telmisartan-hydrochlorothiazide 80-25 MG tablet Commonly known as:  MICARDIS HCT Take 1 tablet by mouth daily.       All past medical history, surgical history, allergies, family history, immunizations andmedications were updated in the EMR today and reviewed under the history and medication portions of their EMR.     ROS: Negative, with the exception of above mentioned in HPI   Objective:  BP 124/80 (BP Location: Right Arm, Patient Position: Sitting, Cuff Size: Large)   Pulse 73   Temp 98.8 F (37.1 C)   Resp 20   Wt 250 lb 4 oz (113.5 kg)   SpO2 98%   BMI 42.96 kg/m  Body mass index is 42.96 kg/m. Gen: Afebrile. No acute distress. Nontoxic in appearance, well developed, well nourished.  HENT: AT. [place].  MMM, no oral lesions.  Eyes:Pupils Equal Round Reactive to light, Extraocular movements intact,  Conjunctiva without redness, discharge or icterus. MSK: No erythema, no soft tissue swelling. TTP over radial head. Discomfort with resisted supination and pronation. Otherwise normal range of motion. Negative Tinel's. Neurovascularly intact distally. Skin: no  rashes, purpura or petechiae. Skin intact.  No exam data present No results found. No results found for this or any previous visit (from the past 24 hour(s)).  Assessment/Plan: [name] [name] is a 51 y.o. female present for OV for  Lateral epicondylitis of left elbow - This appears to be consistent with lateral epicondylitis of her left elbow. Discussed possible differential upper bursitis. This does not appear to be infectious in nature. Importantly patient is unable to take any type  of anti-inflammatory secondary to recent platelet injection. Given that NSAIDs are typically the first line of treatment and I'm unfamiliar with the type of injections and when she received them in her knee, I deferred to the specialist which completed the knee injections to decide if she is able to take a prednisone taper. - She was provided with the prednisone taper prescription that was printed. She has a follow-up with her other physician today, which she will ask if she can take the prednisone taper or see if they  can offer her an injection of steroid if approved. - Patient was advised to pick up a tennis elbow band and educated on which she needed. - Follow-up when necessary  Reviewed expectations re: course of current medical issues.  Discussed self-management of symptoms.  Outlined signs and symptoms indicating need for more acute intervention.  Patient verbalized understanding and all questions were answered.  Patient received an After-Visit Summary.    No orders of the defined types were placed in this encounter.    Note is dictated utilizing voice recognition software. Although note has been proof read prior to signing, occasional typographical errors still can be missed. If any questions arise, please do not hesitate to call for verification.   electronically signed by:  Howard Pouch, DO  [place]

## 2016-09-09 NOTE — Patient Instructions (Signed)
Pick up the brace I showed you today and start wearing it daily. I will prescribed a prednisone taper, since you can not take the naproxen right now. I do want you to check with your other doctor before starting prednisone.... They may not want you on this either. Ask about a steroid injection with them today over that area.    Tennis Elbow Tennis elbow is puffiness (inflammation) of the outer tendons of your forearm close to your elbow. Your tendons attach your muscles to your bones. Tennis elbow can happen in any sport or job in which you use your elbow too much. It is caused by doing the same motion over and over. Tennis elbow can cause:  Pain and tenderness in your forearm and the outer part of your elbow.  A burning feeling. This runs from your elbow through your arm.  Weak grip in your hands.  Follow these instructions at home: Activity  Rest your elbow and wrist as told by your doctor. Try to avoid any activities that caused the problem until your doctor says that you can do them again.  If a physical therapist teaches you exercises, do all of them as told.  If you lift an object, lift it with your palm facing up. This is easier on your elbow. Lifestyle  If your tennis elbow is caused by sports, check your equipment and make sure that: ? You are using it correctly. ? It fits you well.  If your tennis elbow is caused by work, take breaks often, if you are able. Talk with your manager about doing your work in a way that is safe for you. ? If your tennis elbow is caused by computer use, talk with your manager about any changes that can be made to your work setup. General instructions  If told, apply ice to the painful area: ? Put ice in a plastic bag. ? Place a towel between your skin and the bag. ? Leave the ice on for 20 minutes, 2-3 times per day.  Take medicines only as told by your doctor.  If you were given a brace, wear it as told by your doctor.  Keep all follow-up  visits as told by your doctor. This is important. Contact a doctor if:  Your pain does not get better with treatment.  Your pain gets worse.  You have weakness in your forearm, hand, or fingers.  You cannot feel your forearm, hand, or fingers. This information is not intended to replace advice given to you by your health care provider. Make sure you discuss any questions you have with your health care provider. Document Released: 08/06/2009 Document Revised: 10/17/2015 Document Reviewed: 02/12/2014 Elsevier Interactive Patient Education  [employer].

## 2016-09-11 DIAGNOSIS — S83241A Other tear of medial meniscus, current injury, right knee, initial encounter: Secondary | ICD-10-CM | POA: Diagnosis not present

## 2016-09-11 DIAGNOSIS — M25461 Effusion, right knee: Secondary | ICD-10-CM | POA: Diagnosis not present

## 2016-09-11 DIAGNOSIS — M660 Rupture of popliteal cyst: Secondary | ICD-10-CM | POA: Diagnosis not present

## 2016-09-11 DIAGNOSIS — M1711 Unilateral primary osteoarthritis, right knee: Secondary | ICD-10-CM | POA: Diagnosis not present

## 2016-09-23 DIAGNOSIS — M2351 Chronic instability of knee, right knee: Secondary | ICD-10-CM | POA: Diagnosis not present

## 2016-09-23 DIAGNOSIS — M25561 Pain in right knee: Secondary | ICD-10-CM | POA: Diagnosis not present

## 2016-09-23 DIAGNOSIS — M1711 Unilateral primary osteoarthritis, right knee: Secondary | ICD-10-CM | POA: Diagnosis not present

## 2016-11-17 ENCOUNTER — Other Ambulatory Visit: Payer: Self-pay | Admitting: Neurology

## 2016-11-17 ENCOUNTER — Other Ambulatory Visit: Payer: Self-pay | Admitting: *Deleted

## 2016-11-17 MED ORDER — ROPINIROLE HCL ER 4 MG PO TB24: 4.0000 mg | ORAL_TABLET | Freq: Every day | ORAL | 5 refills | Status: DC

## 2016-11-18 DIAGNOSIS — M25522 Pain in left elbow: Secondary | ICD-10-CM | POA: Diagnosis not present

## 2016-11-18 DIAGNOSIS — M79651 Pain in right thigh: Secondary | ICD-10-CM | POA: Diagnosis not present

## 2016-11-18 DIAGNOSIS — M545 Low back pain: Secondary | ICD-10-CM | POA: Diagnosis not present

## 2016-11-18 DIAGNOSIS — M79652 Pain in left thigh: Secondary | ICD-10-CM | POA: Diagnosis not present

## 2016-11-24 ENCOUNTER — Encounter: Payer: Self-pay | Admitting: Neurology

## 2016-11-24 ENCOUNTER — Ambulatory Visit (INDEPENDENT_AMBULATORY_CARE_PROVIDER_SITE_OTHER): Payer: BLUE CROSS/BLUE SHIELD | Admitting: Neurology

## 2016-11-24 VITALS — BP 130/81 | HR 80 | Ht 64.0 in | Wt 246.0 lb

## 2016-11-24 DIAGNOSIS — F5104 Psychophysiologic insomnia: Secondary | ICD-10-CM | POA: Insufficient documentation

## 2016-11-24 DIAGNOSIS — G2581 Restless legs syndrome: Secondary | ICD-10-CM

## 2016-11-24 HISTORY — DX: Psychophysiologic insomnia: F51.04

## 2016-11-24 MED ORDER — ROPINIROLE HCL ER 4 MG PO TB24: 4.0000 mg | ORAL_TABLET | Freq: Every day | ORAL | 3 refills | Status: DC

## 2016-11-24 MED ORDER — ROPINIROLE HCL ER 2 MG PO TB24: 2.0000 mg | ORAL_TABLET | Freq: Every day | ORAL | 3 refills | Status: DC

## 2016-11-24 MED ORDER — GABAPENTIN ENACARBIL ER 300 MG PO TBCR: EXTENDED_RELEASE_TABLET | ORAL | 11 refills | Status: DC

## 2016-11-24 NOTE — Progress Notes (Signed)
Reason for visit: Restless leg syndrome  [name] [name] is an 51 y.o. female  History of present illness:  Ms. Tieszen is a 51 year old right-handed white female with a history of obesity and restless leg syndrome. The patient is on Horizant and she is on long-acting Requip taking a total 6 mg at night. The patient does well with this regimen, she has about 2 nights a month where she may have problems with restless legs. Her main issue is chronic insomnia that began about one year ago, the patient has been under some stress with a separation with her husband. The patient will get to sleep fairly well at night, but then she will wake up early in the morning and cannot get back to sleep. The patient is on Cymbalta and Wellbutrin for depression. The patient has not taken anything for sleep. She returns to this office for an evaluation.  Past Medical History:  Diagnosis Date  . Anxiety 01/12/2013  . Asthma   . Chicken pox   . Clostridium difficile infection 04/02/2014, 02/06/2014  . Depression   . Dyslipidemia   . HTN (hypertension)   . Hypothyroidism   . Migraine   . Multiple allergies   . Obesity   . RLS (restless legs syndrome)     Past Surgical History:  Procedure Laterality Date  . BREAST BIOPSY  1245,8099   x 2 benign lesion  . CERVICAL DISCECTOMY  2003  . CERVICAL FUSION  2010   x2  . Hand fracture surgery    . LUMBAR FUSION  2016  . VAGINAL HYSTERECTOMY  2009    Family History  Problem Relation Age of Onset  . Hypertension Father   . Diabetes Father   . Heart disease Father   . Hearing loss Father   . Diabetes Brother   . Arthritis Mother   . Liver cancer Mother   . COPD Mother   . Mental illness Mother   . Diabetes Mother   . Hearing loss Mother   . Heart disease Mother     Social history:  reports that she has never smoked. She has never used smokeless tobacco. She reports that she does not drink alcohol or use drugs.    Allergies  Allergen Reactions    . Bee Venom Swelling and Anaphylaxis  . Biaxin [Clarithromycin] Diarrhea and Rash  . Gabapentin     Nausea  . Abilify [Aripiprazole] Other (See Comments)    MOOD SWING  . Buspar [Buspirone] Diarrhea  . Lisinopril Cough and Other (See Comments)    Cough  . Maxzide [Triamterene-Hctz] Rash  . Phentermine Other (See Comments)    MOOD CHANGE   . Seroquel [Quetiapine Fumarate] Other (See Comments)    MOOD SWINGS    Medications:  Prior to Admission medications   Medication Sig Start Date End Date Taking? Authorizing Provider  acetaminophen (TYLENOL) 500 MG tablet Take 1,000 mg by mouth daily.   Yes [provider]  buPROPion (WELLBUTRIN XL) 300 MG 24 hr tablet Take 1 tablet (300 mg total) by mouth daily. 07/07/16  Yes Kuneff, Renee A, DO  DULoxetine (CYMBALTA) 60 MG capsule Take 1 capsule (60 mg total) by mouth daily. 07/07/16  Yes Kuneff, Renee A, DO  estradiol (ESTRACE) 1 MG tablet Take 1 mg by mouth daily. 11/08/15  Yes [provider]  Gabapentin Enacarbil ER (HORIZANT) 300 MG TBCR TAKE 2 TABLETS BY MOUTH EVERY DAY AT 5PM WITH FOOD 11/19/15  Yes [name],  MD  NATURE-THROID 48.75 MG TABS TAKE 2 TABLETS EVERY MORNING ON AN EMPTY STOMACH 10/22/15  Yes [provider]  progesterone (PROMETRIUM) 100 MG capsule Take 100 mg by mouth at bedtime.  10/31/15  Yes [provider]  rOPINIRole (REQUIP XL) 2 MG 24 hr tablet TAKE 1 TABLET BY MOUTH ONCE DAILY 11/17/16  Yes Ward Givens, NP  rOPINIRole (REQUIP XL) 4 MG 24 hr tablet Take 1 tablet (4 mg total) by mouth at bedtime. 11/17/16  Yes Ward Givens, NP  telmisartan-hydrochlorothiazide (MICARDIS HCT) 80-25 MG tablet Take 1 tablet by mouth daily. 07/07/16  Yes Kuneff, Renee A, DO    ROS:  Out of a complete 14 system review of symptoms, the patient complains only of the following symptoms, and all other reviewed systems are negative.  Appetite change Cold intolerance, excessive eating Restless legs,  frequent waking, daytime sleepiness Joint pain, joint swelling, back pain, walking difficulty Insomnia  Blood pressure 130/81, pulse 80, height 5\' 4"  (1.626 m), weight 246 lb (111.6 kg).  Physical Exam  General: The patient is alert and cooperative at the time of the examination. The patient is moderately to markedly obese.  Skin: No significant peripheral edema is noted.   Neurologic Exam  Mental status: The patient is alert and oriented x 3 at the time of the examination. The patient has apparent normal recent and remote memory, with an apparently normal attention span and concentration ability.   Cranial nerves: Facial symmetry is present. Speech is normal, no aphasia or dysarthria is noted. Extraocular movements are full. Visual fields are full.  Motor: The patient has good strength in all 4 extremities.  Sensory examination: Soft touch sensation is symmetric on the face, arms, and legs.  Coordination: The patient has good finger-nose-finger and heel-to-shin bilaterally.  Gait and station: The patient has a normal gait. Tandem gait is normal. Romberg is negative. No drift is seen.  Reflexes: Deep tendon reflexes are symmetric.   Assessment/Plan:  1. Restless leg syndrome  2. Chronic insomnia  The patient is doing relatively well with the restless leg syndrome, prescriptions for her Horizant, and for the Requip were sent in. The patient as a new problem associated with insomnia. The patient is able to get to sleep fairly well at night, but will wake up in the middle the night and cannot get back to sleep. This may be a manifestation of anxiety. The patient will give a trial on Benadryl for sleep, if this is not helpful, she will call our office and we will consider the use of low-dose Belsomra. The patient will follow-up in 6 months.  Jill Alexanders MD 11/24/2016 8:17 AM  [facility] [address] [place], [place] [address]  Phone  [phone] Fax [phone]

## 2016-12-04 LAB — BASIC METABOLIC PANEL: Glucose: 168

## 2016-12-28 ENCOUNTER — Other Ambulatory Visit: Payer: Self-pay | Admitting: *Deleted

## 2016-12-28 MED ORDER — DULOXETINE HCL 60 MG PO CPEP: 60.0000 mg | ORAL_CAPSULE | Freq: Every day | ORAL | 0 refills | Status: DC

## 2017-01-06 ENCOUNTER — Encounter: Payer: Self-pay | Admitting: Family Medicine

## 2017-01-06 ENCOUNTER — Ambulatory Visit (INDEPENDENT_AMBULATORY_CARE_PROVIDER_SITE_OTHER): Payer: BLUE CROSS/BLUE SHIELD | Admitting: Family Medicine

## 2017-01-06 VITALS — BP 122/81 | HR 75 | Temp 98.0°F | Resp 20 | Ht 64.0 in | Wt 246.5 lb

## 2017-01-06 DIAGNOSIS — R7309 Other abnormal glucose: Secondary | ICD-10-CM | POA: Diagnosis not present

## 2017-01-06 DIAGNOSIS — I10 Essential (primary) hypertension: Secondary | ICD-10-CM

## 2017-01-06 DIAGNOSIS — E785 Hyperlipidemia, unspecified: Secondary | ICD-10-CM | POA: Diagnosis not present

## 2017-01-06 DIAGNOSIS — M2550 Pain in unspecified joint: Secondary | ICD-10-CM

## 2017-01-06 DIAGNOSIS — F339 Major depressive disorder, recurrent, unspecified: Secondary | ICD-10-CM

## 2017-01-06 LAB — CBC
HCT: 43 % (ref 36.0–46.0)
Hemoglobin: 14.4 g/dL (ref 12.0–15.0)
MCHC: 33.5 g/dL (ref 30.0–36.0)
MCV: 92.6 fl (ref 78.0–100.0)
Platelets: 330 10*3/uL (ref 150.0–400.0)
RBC: 4.64 Mil/uL (ref 3.87–5.11)
RDW: 13.5 % (ref 11.5–15.5)
WBC: 8.9 10*3/uL (ref 4.0–10.5)

## 2017-01-06 LAB — COMPREHENSIVE METABOLIC PANEL
ALT: 25 U/L (ref 0–35)
AST: 15 U/L (ref 0–37)
Albumin: 4.1 g/dL (ref 3.5–5.2)
Alkaline Phosphatase: 75 U/L (ref 39–117)
BUN: 17 mg/dL (ref 6–23)
CO2: 30 mEq/L (ref 19–32)
Calcium: 9.8 mg/dL (ref 8.4–10.5)
Chloride: 102 mEq/L (ref 96–112)
Creatinine, Ser: 0.62 mg/dL (ref 0.40–1.20)
GFR: 108.06 mL/min (ref >60.00)
Glucose, Bld: 92 mg/dL (ref 70–99)
Potassium: 3.7 mEq/L (ref 3.5–5.1)
Sodium: 140 mEq/L (ref 135–145)
Total Bilirubin: 0.3 mg/dL (ref 0.2–1.2)
Total Protein: 6.8 g/dL (ref 6.0–8.3)

## 2017-01-06 LAB — HEMOGLOBIN A1C: Hgb A1c MFr Bld: 6.1 % (ref 4.6–6.5)

## 2017-01-06 MED ORDER — BUPROPION HCL ER (XL) 300 MG PO TB24: 300.0000 mg | ORAL_TABLET | Freq: Every day | ORAL | 1 refills | Status: DC

## 2017-01-06 MED ORDER — DULOXETINE HCL 60 MG PO CPEP: 60.0000 mg | ORAL_CAPSULE | Freq: Every day | ORAL | 1 refills | Status: DC

## 2017-01-06 MED ORDER — TELMISARTAN-HCTZ 80-25 MG PO TABS: 1.0000 | ORAL_TABLET | Freq: Every day | ORAL | 1 refills | Status: DC

## 2017-01-06 MED ORDER — MELOXICAM 15 MG PO TABS
15.0000 mg | ORAL_TABLET | Freq: Every day | ORAL | 1 refills | Status: DC
Start: 2017-01-06 — End: 2017-10-08

## 2017-01-06 NOTE — Progress Notes (Signed)
Patient ID: [name] [name], female  DOB: 03-19-1965, 51 y.o.   MRN: [id number] Patient Care Team    Relationship Specialty Notifications Start End  [name], DO PCP - General Family Medicine  07/07/16   Ob/Gyn, [name]    07/08/16   Wilford Corner, MD Consulting Physician Gastroenterology  07/08/16   [name], MD Consulting Physician Neurology  07/08/16   [name], MD Attending Physician Endocrinology  07/08/16    Comment: Pt established with Si Raider, NP at this location. - Integrative med- thyroid    Chief Complaint  Patient presents with  . Hypertension  . Depression    Subjective:  [name] [name] is a 51 y.o.  female present for    Depression/anxiety:  Patient reports she is doing well with her Wellbutrin 300 mg daily and Cymbalta 60 mg daily. He again states the pressure was never her issue, she denies any signs of depression. She admits to serotonin deficiency.  Prior note 07/07/2016: She reports she has never really been depressed. She was diagnosed with "serotonin deficiency "approximately 15 years ago. She is currently prescribed Wellbutrin 300 mg daily and Cymbalta 60 mg daily. She states she feels good on this medication. She will need refills today.  Arthralgia: Patient doing well on meloxicam 15 mg daily. She does take refills on this medication. Prior note 07/07/2016: Patient reports that she has pain in "all of her joints ". She feels her knees are worse. She is prescribed meloxicam 15 mg daily. She tolerates this medication well. She reports with use of this medication her discomfort is tolerable. She will need refills on his medication today.  Hypertension/obesity/elevated a1c: Pt reports compliance with micardis 80-25 milligrams. Blood pressures ranges at home within normal limits. Patient denies chest pain, shortness of breath or lower extremity edema. Pt does not take a daily baby ASA. Pt is not prescribed statin. BMP: 04/03/2015  creatinine 0.6 CBC: 11/11/2010 within normal limits Lipids: 04/03/2015 within normal limits TSH: Monitor through endocrine Diet/exercise does not routinely watch diet and exercise RF: Hypertension, obesity,fhx heart disease   hypothyroidism: Patient has had abnormal TSH and T4 in 2017. She had some dose changes to her Synthroid at that time, she is now seeing integrative medicine nurse practitioner at [place]. She is now on nature thyroid 48.75. She is also prescribed estradiol/progesterone through integrative medicine.  Restless leg syndrome: Patient is established with Dr. Jannifer Franklin who prescribes gabapentin and Requip for her restless leg syndrome.   Outside labs/images:  09/28/2015 Vitamin D 26.89  C-reactive protein, cardiac 5.39 elevated at high risk. B 12 293 TSH 5.18, normal T4 free, normal thyroid clothing antibody, normal T3.  04/03/2015. Elevated hemoglobin A1c of 5.9 (09/08/14--> 6.2) Lipid panel within normal limits. CMP within normal limits CBC within normal limits MRI Lumbar spine 2012: IMPRESSION:  1. Interval worsening of the degenerative changes at L3-L4 and L4-L5 since July 2012.Of note there is now moderate to severe central canal stenosis at L4-L5. 2. Interval surgical changes of interbody and posterior spinal fusion at L2-L3 with improvement of the central canal stenosis at this level.  Depression screen [facility] 2/9 01/06/2017 07/07/2016  Decreased Interest 0 0  Down, Depressed, Hopeless 0 0  PHQ - 2 Score 0 0  Altered sleeping 3 -  Tired, decreased energy 0 -  Change in appetite 0 -  Feeling bad or failure about yourself  0 -  Trouble concentrating 0 -  Moving slowly or fidgety/restless 0 -  Suicidal thoughts 0 -  PHQ-9 Score 3 -  Difficult doing work/chores Not difficult at all -   No flowsheet data found.      Fall Risk  07/07/2016  Falls in the past year? No     Immunization History  Administered Date(s) Administered  .  Influenza,trivalent, recombinat, inj, PF 12/04/2016    No exam data present  Past Medical History:  Diagnosis Date  . Anxiety 01/12/2013  . Asthma   . Chicken pox   . Chronic insomnia 11/24/2016  . Clostridium difficile infection 04/02/2014, 02/06/2014  . Depression   . Dyslipidemia   . HTN (hypertension)   . Hypothyroidism   . Migraine   . Multiple allergies   . Obesity   . RLS (restless legs syndrome)    Allergies  Allergen Reactions  . Bee Venom Swelling and Anaphylaxis  . Biaxin [Clarithromycin] Diarrhea and Rash  . Gabapentin     Nausea  . Abilify [Aripiprazole] Other (See Comments)    MOOD SWING  . Buspar [Buspirone] Diarrhea  . Lisinopril Cough and Other (See Comments)    Cough  . Maxzide [Triamterene-Hctz] Rash  . Phentermine Other (See Comments)    MOOD CHANGE   . Seroquel [Quetiapine Fumarate] Other (See Comments)    MOOD SWINGS   Past Surgical History:  Procedure Laterality Date  . BREAST BIOPSY  7035,0093   x 2 benign lesion  . CERVICAL DISCECTOMY  2003  . CERVICAL FUSION  2010   x2  . Hand fracture surgery    . LUMBAR FUSION  2016  . VAGINAL HYSTERECTOMY  2009   Family History  Problem Relation Age of Onset  . Hypertension Father   . Diabetes Father   . Heart disease Father   . Hearing loss Father   . Diabetes Brother   . Arthritis Mother   . Liver cancer Mother   . COPD Mother   . Mental illness Mother   . Diabetes Mother   . Hearing loss Mother   . Heart disease Mother    Social History   Socioeconomic History  . Marital status: Married    Spouse name: [name] [name]  . Number of children: 2  . Years of education: COLLEGE  . Highest education level: Not on file  Social Needs  . Financial resource strain: Not on file  . Food insecurity - worry: Not on file  . Food insecurity - inability: Not on file  . Transportation needs - medical: Not on file  . Transportation needs - non-medical: Not on file  Occupational History  . Occupation:  OWNER    Employer: [employer]  Tobacco Use  . Smoking status: Never Smoker  . Smokeless tobacco: Never Used  Substance and Sexual Activity  . Alcohol use: No    Alcohol/week: 0.0 oz  . Drug use: No  . Sexual activity: Yes    Partners: Male    Birth control/protection: None  Other Topics Concern  . Not on file  Social History Narrative   Patient is married [name] [name])  2 children [name] and [place])   Patient is right handed.   Patient has a college education. 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    Patient drinks 1 cups daily. Uses herbal remedies, takes a daily vitamin.   Wears her seatbelt, smoke detector in the home.   Allergies as of 01/06/2017      Reactions   Bee Venom Swelling, Anaphylaxis   Biaxin [  clarithromycin] Diarrhea, Rash   Gabapentin    Nausea   Abilify [aripiprazole] Other (See Comments)   MOOD SWING   Buspar [buspirone] Diarrhea   Lisinopril Cough, Other (See Comments)   Cough   Maxzide [triamterene-hctz] Rash   Phentermine Other (See Comments)   MOOD CHANGE   Seroquel [quetiapine Fumarate] Other (See Comments)   MOOD SWINGS      Medication List        Accurate as of 01/06/17  2:10 PM. Always use your most recent med list.          acetaminophen 500 MG tablet Commonly known as:  TYLENOL Take 1,000 mg by mouth daily.   buPROPion 300 MG 24 hr tablet Commonly known as:  WELLBUTRIN XL Take 1 tablet (300 mg total) daily by mouth.   DULoxetine 60 MG capsule Commonly known as:  CYMBALTA Take 1 capsule (60 mg total) daily by mouth. Needs office visit prior to anymore refills.   estradiol 1 MG tablet Commonly known as:  ESTRACE Take 2 mg daily by mouth.   Gabapentin Enacarbil ER 300 MG Tbcr Commonly known as:  HORIZANT TAKE 2 TABLETS BY MOUTH EVERY DAY AT 5PM WITH FOOD   meloxicam 15 MG tablet Commonly known as:  MOBIC Take 1 tablet (15 mg total) daily by mouth.   NATURE-THROID 48.75 MG Tabs Generic drug:  Thyroid TAKE 2 TABLETS EVERY MORNING ON  AN EMPTY STOMACH   progesterone 100 MG capsule Commonly known as:  PROMETRIUM Take 100 mg by mouth at bedtime.   rOPINIRole 4 MG 24 hr tablet Commonly known as:  REQUIP XL Take 1 tablet (4 mg total) by mouth at bedtime.   rOPINIRole 2 MG 24 hr tablet Commonly known as:  REQUIP XL Take 1 tablet (2 mg total) by mouth daily.   telmisartan-hydrochlorothiazide 80-25 MG tablet Commonly known as:  MICARDIS HCT Take 1 tablet daily by mouth.       All past medical history, surgical history, allergies, family history, immunizations andmedications were updated in the EMR today and reviewed under the history and medication portions of their EMR.    Recent Results (from the past 2160 hour(s))  Basic metabolic panel     Status: None   Collection Time: 12/04/16 12:00 AM  Result Value Ref Range   Glucose 168     ROS: 14 pt review of systems performed and negative (unless mentioned in an HPI)  Objective: BP 122/81 (BP Location: Right Arm, Patient Position: Sitting, Cuff Size: Large)   Pulse 75   Temp 98 F (36.7 C)   Resp 20   Ht 5\' 4"  (1.626 m)   Wt 246 lb 8 oz (111.8 kg)   SpO2 97%   BMI 42.31 kg/m  Gen: Afebrile. No acute distress. Non toxic in appearance, well developed, obese female.  HENT: AT. [place].  MMM.  Eyes:Pupils Equal Round Reactive to light, Extraocular movements intact,  Conjunctiva without redness, discharge or icterus. CV: RRR no murmur, no edema, +2/4 P posterior tibialis pulses Chest: CTAB, no wheeze or crackles Abd: Soft. obese. NTND. BS present.  Neuro:  Normal gait. PERLA. EOMi. Alert. Oriented x3 Psych: Normal affect, dress and demeanor. Normal speech. Normal thought content and judgment.    Assessment/plan: NILSA MACHT is a 51 y.o. female present for establishment of care. Essential hypertension/HLD Morbid obesity (HCC) BMI 40.0-44.9, adult (HCC) BP stable.  continue low sodium diet and exercise.  Refills on telmisartan/hctz 80-25 mg today.  CBC, CMP  collected today.  Follow-up in 6 months.  Elevated a1c: Prior a1c 5.9 last year, collected repeat today.   Acquired hypothyroidism Follows with [facility] for thyroid and hormone replacement.  Recurrent major depressive disorder, remission status unspecified ([place]) Patient reports she was diagnosed with "serotonin syndrome (not depression) ".  Stable, doing well. Refills on wellbutrin and cymbalta.  Follow-up in 6 months  Arthralgia, unspecified joint Tolerating mobic. Refilled today for her.   Restless legs syndrome (RLS) Follows with Dr. Jannifer Franklin, neuro. Is prescribe Requip and gabapentin through his office.  Return in about 6 months (around 07/06/2017) for HTN/.     Note is dictated utilizing voice recognition software. Although note has been proof read prior to signing, occasional typographical errors still can be missed. If any questions arise, please do not hesitate to call for verification.  Electronically signed by: Howard Pouch, DO [place]

## 2017-01-06 NOTE — Patient Instructions (Signed)
I have refilled your medications today. Follow up in 6 months as long as doing well.   We will call you with labs once available.   Please help [place] help you:  We are honored you have chosen [facility] for your Primary Care home. Below you will find basic instructions that you may need to access in the future. Please help [place] help you by reading the instructions, which cover many of the frequent questions we experience.   Prescription refills and request:  -In order to allow more efficient response time, please call your pharmacy for all refills. They will forward the request electronically to [place]. This allows for the quickest possible response. Request left on a nurse line can take longer to refill, since these are checked as time allows between office patients and other phone calls.  - refill request can take up to 3-5 working days to complete.  - If request is sent electronically and request is appropiate, it is usually completed in 1-2 business days.  - all patients will need to be seen routinely for all chronic medical conditions requiring prescription medications (see follow-up below). If you are overdue for follow up on your condition, you will be asked to make an appointment and we will call in enough medication to cover you until your appointment (up to 30 days).  - all controlled substances will require a face to face visit to request/refill.  - if you desire your prescriptions to go through a new pharmacy, and have an active script at original pharmacy, you will need to call your pharmacy and have scripts transferred to new pharmacy. This is completed between the pharmacy locations and not by your provider.    Results: If any images or labs were ordered, it can take up to 1 week to get results depending on the test ordered and the lab/facility running and resulting the test. - Normal or stable results, which do not need further discussion, may be released to your mychart immediately  with attached note to you. A call may not be generated for normal results. Please make certain to sign up for mychart. If you have questions on how to activate your mychart you can call the front office.  - If your results need further discussion, our office will attempt to contact you via phone, and if unable to reach you after 2 attempts, we will release your abnormal result to your mychart with instructions.  - All results will be automatically released in mychart after 1 week.  - Your provider will provide you with explanation and instruction on all relevant material in your results. Please keep in mind, results and labs may appear confusing or abnormal to the untrained eye, but it does not mean they are actually abnormal for you personally. If you have any questions about your results that are not covered, or you desire more detailed explanation than what was provided, you should make an appointment with your provider to do so.   Our office handles many outgoing and incoming calls daily. If we have not contacted you within 1 week about your results, please check your mychart to see if there is a message first and if not, then contact our office.  In helping with this matter, you help decrease call volume, and therefore allow [place] to be able to respond to patients needs more efficiently.   Acute office visits (sick visit):  An acute visit is intended for a new problem and are scheduled in  shorter time slots to allow schedule openings for patients with new problems. This is the appropriate visit to discuss a new problem. In order to provide you with excellent quality medical care with proper time for you to explain your problem, have an exam and receive treatment with instructions, these appointments should be limited to one new problem per visit. If you experience a new problem, in which you desire to be addressed, please make an acute office visit, we save openings on the schedule to accommodate you.  Please do not save your new problem for any other type of visit, let us take care of it properly and quickly for you.   Follow up visits:  Depending on your condition(s) your provider will need to see you routinely in order to provide you with quality care and prescribe medication(s). Most chronic conditions (Example: hypertension, Diabetes, depression/anxiety... etc), require visits a couple times a year. Your provider will instruct you on proper follow up for your personal medical conditions and history. Please make certain to make follow up appointments for your condition as instructed. Failing to do so could result in lapse in your medication treatment/refills. If you request a refill, and are overdue to be seen on a condition, we will always provide you with a 30 day script (once) to allow you time to schedule.    Medicare wellness (well visit): - we have a wonderful Nurse Maudie Mercury), that will meet with you and provide you will yearly medicare wellness visits. These visits should occur yearly (can not be scheduled less than 1 calendar year apart) and cover preventive health, immunizations, advance directives and screenings you are entitled to yearly through your medicare benefits. Do not miss out on your entitled benefits, this is when medicare will pay for these benefits to be ordered for you.  These are strongly encouraged by your provider and is the appropriate type of visit to make certain you are up to date with all preventive health benefits. If you have not had your medicare wellness exam in the last 12 months, please make certain to schedule one by calling the office and schedule your medicare wellness with Maudie Mercury as soon as possible.   Yearly physical (well visit):  - Adults are recommended to be seen yearly for physicals. Check with your insurance and date of your last physical, most insurances require one calendar year between physicals. Physicals include all preventive health topics, screenings,  medical exam and labs that are appropriate for gender/age and history. You may have fasting labs needed at this visit. This is a well visit (not a sick visit), new problems should not be covered during this visit (see acute visit).  - Pediatric patients are seen more frequently when they are younger. Your provider will advise you on well child visit timing that is appropriate for your their age. - This is not a medicare wellness visit. Medicare wellness exams do not have an exam portion to the visit. Some medicare companies allow for a physical, some do not allow a yearly physical. If your medicare allows a yearly physical you can schedule the medicare wellness with our nurse Maudie Mercury and have your physical with your provider after, on the same day. Please check with insurance for your full benefits.   Late Policy/No Shows:  - all new patients should arrive 15-30 minutes earlier than appointment to allow [place] time  to  obtain all personal demographics,  insurance information and for you to complete office paperwork. - All established patients  should arrive 10-15 minutes earlier than appointment time to update all information and be checked in .  - In our best efforts to run on time, if you are late for your appointment you will be asked to either reschedule or if able, we will work you back into the schedule. There will be a wait time to work you back in the schedule,  depending on availability.  - If you are unable to make it to your appointment as scheduled, please call 24 hours ahead of time to allow [place] to fill the time slot with someone else who needs to be seen. If you do not cancel your appointment ahead of time, you may be charged a no show fee.

## 2017-01-15 ENCOUNTER — Other Ambulatory Visit: Payer: Self-pay | Admitting: *Deleted

## 2017-01-15 DIAGNOSIS — I10 Essential (primary) hypertension: Secondary | ICD-10-CM

## 2017-01-15 MED ORDER — TELMISARTAN-HCTZ 80-25 MG PO TABS: 1.0000 | ORAL_TABLET | Freq: Every day | ORAL | 1 refills | Status: DC

## 2017-01-22 ENCOUNTER — Other Ambulatory Visit: Payer: Self-pay | Admitting: Family Medicine

## 2017-01-25 ENCOUNTER — Ambulatory Visit (INDEPENDENT_AMBULATORY_CARE_PROVIDER_SITE_OTHER): Payer: BLUE CROSS/BLUE SHIELD | Admitting: Family Medicine

## 2017-01-25 ENCOUNTER — Telehealth: Payer: Self-pay | Admitting: Family Medicine

## 2017-01-25 ENCOUNTER — Encounter: Payer: Self-pay | Admitting: Family Medicine

## 2017-01-25 VITALS — BP 134/87 | HR 85 | Temp 98.7°F | Resp 20 | Wt 247.8 lb

## 2017-01-25 DIAGNOSIS — R062 Wheezing: Secondary | ICD-10-CM | POA: Diagnosis not present

## 2017-01-25 DIAGNOSIS — R05 Cough: Secondary | ICD-10-CM

## 2017-01-25 DIAGNOSIS — R059 Cough, unspecified: Secondary | ICD-10-CM

## 2017-01-25 DIAGNOSIS — J40 Bronchitis, not specified as acute or chronic: Secondary | ICD-10-CM | POA: Diagnosis not present

## 2017-01-25 MED ORDER — PREDNISONE 20 MG PO TABS: 40.0000 mg | ORAL_TABLET | Freq: Every day | ORAL | 0 refills | Status: DC

## 2017-01-25 MED ORDER — DOXYCYCLINE HYCLATE 100 MG PO TABS: 100.0000 mg | ORAL_TABLET | Freq: Two times a day (BID) | ORAL | 0 refills | Status: DC

## 2017-01-25 MED ORDER — IPRATROPIUM-ALBUTEROL 0.5-2.5 (3) MG/3ML IN SOLN
3.0000 mL | Freq: Once | RESPIRATORY_TRACT | Status: AC
  Administered 2017-01-25: 3 mL via RESPIRATORY_TRACT

## 2017-01-25 NOTE — Telephone Encounter (Signed)
Patient scheduled for an appointment 01/25/17, today with Dr. Raoul Pitch at 1:30pm.

## 2017-01-25 NOTE — Telephone Encounter (Signed)
Patient Name: [name] [name] DOB: [birth year] Initial Comment Caller states hurts in chest when breathing and wheezing Nurse Assessment Nurse: Rene Kocher, RN, [place] Date/Time (Eastern Time): 01/25/2017 11:20:05 AM Confirm and document reason for call. If symptomatic, describe symptoms. ---caller states she has some chest pain when she takes a deep breath. onset of coughing yesterday. denies asthma. pt is wheezing. Does the patient have any new or worsening symptoms? ---Yes Will a triage be completed? ---Yes Related visit to physician within the last 2 weeks? ---No Does the PT have any chronic conditions? (i.e. diabetes, asthma, etc.) ---Yes List chronic conditions. ---high blood pressure, hypothyroidism Is the patient pregnant or possibly pregnant? (Ask all females between the ages of 67-55) ---No Is this a behavioral health or substance abuse call? ---No Guidelines Guideline Title Affirmed Question Affirmed Notes Cough - Acute Non-Productive Wheezing is present Final Disposition User See Physician within 4 Hours (or PCP triage) Rene Kocher, RN, [place] Comments appt made for 1330 today Referrals REFERRED TO PCP OFFICE Caller Disagree/Comply Comply Caller Understands Yes PreDisposition Call Doctor

## 2017-01-25 NOTE — Progress Notes (Signed)
[name] [name] , 1965/07/26, 51 y.o., female MRN: [id number] Patient Care Team    Relationship Specialty Notifications Start End  [name], DO PCP - General Family Medicine  07/07/16   Ob/Gyn, [name]    07/08/16   Wilford Corner, MD Consulting Physician Gastroenterology  07/08/16   [name], MD Consulting Physician Neurology  07/08/16   [name], MD Attending Physician Endocrinology  07/08/16    Comment: Pt established with Si Raider, NP at this location. - [facility]    Chief Complaint  Patient presents with  . URI    cough,chest congestion,wheezing, pain with inspiration     Subjective: Pt presents for an OV with complaints of cough, chest congestion, wheezing, headache, vomit x1, fatigue, pain with deep inspiration of 3-4 days duration.  Pt reports the symptoms started with a sore throat first.  Pt has tried OTC sold and sinus to ease their symptoms.   Depression screen [facility] 2/9 01/06/2017 07/07/2016  Decreased Interest 0 0  Down, Depressed, Hopeless 0 0  PHQ - 2 Score 0 0  Altered sleeping 3 -  Tired, decreased energy 0 -  Change in appetite 0 -  Feeling bad or failure about yourself  0 -  Trouble concentrating 0 -  Moving slowly or fidgety/restless 0 -  Suicidal thoughts 0 -  PHQ-9 Score 3 -  Difficult doing work/chores Not difficult at all -    Allergies  Allergen Reactions  . Bee Venom Swelling and Anaphylaxis  . Biaxin [Clarithromycin] Diarrhea and Rash  . Gabapentin     Nausea  . Abilify [Aripiprazole] Other (See Comments)    MOOD SWING  . Buspar [Buspirone] Diarrhea  . Lisinopril Cough and Other (See Comments)    Cough  . Maxzide [Triamterene-Hctz] Rash  . Phentermine Other (See Comments)    MOOD CHANGE   . Seroquel [Quetiapine Fumarate] Other (See Comments)    MOOD SWINGS   Social History   Tobacco Use  . Smoking status: Never Smoker  . Smokeless tobacco: Never Used  Substance Use Topics  . Alcohol use:  No    Alcohol/week: 0.0 oz   Past Medical History:  Diagnosis Date  . Anxiety 01/12/2013  . Asthma   . Chicken pox   . Chronic insomnia 11/24/2016  . Clostridium difficile infection 04/02/2014, 02/06/2014  . Depression   . Dyslipidemia   . HTN (hypertension)   . Hypothyroidism   . Migraine   . Multiple allergies   . Obesity   . RLS (restless legs syndrome)    Past Surgical History:  Procedure Laterality Date  . BREAST BIOPSY  [id number]   x 2 benign lesion  . CERVICAL DISCECTOMY  2003  . CERVICAL FUSION  2010   x2  . Hand fracture surgery    . LUMBAR FUSION  2016  . VAGINAL HYSTERECTOMY  2009   Family History  Problem Relation Age of Onset  . Hypertension Father   . Diabetes Father   . Heart disease Father   . Hearing loss Father   . Diabetes Brother   . Arthritis Mother   . Liver cancer Mother   . COPD Mother   . Mental illness Mother   . Diabetes Mother   . Hearing loss Mother   . Heart disease Mother    Allergies as of 01/25/2017      Reactions   Bee Venom Swelling, Anaphylaxis   Biaxin [clarithromycin] Diarrhea, Rash  Gabapentin    Nausea   Abilify [aripiprazole] Other (See Comments)   MOOD SWING   Buspar [buspirone] Diarrhea   Lisinopril Cough, Other (See Comments)   Cough   Maxzide [triamterene-hctz] Rash   Phentermine Other (See Comments)   MOOD CHANGE   Seroquel [quetiapine Fumarate] Other (See Comments)   MOOD SWINGS      Medication List        Accurate as of 01/25/17  1:45 PM. Always use your most recent med list.          acetaminophen 500 MG tablet Commonly known as:  TYLENOL Take 1,000 mg by mouth daily.   buPROPion 300 MG 24 hr tablet Commonly known as:  WELLBUTRIN XL Take 1 tablet (300 mg total) daily by mouth.   DULoxetine 60 MG capsule Commonly known as:  CYMBALTA Take 1 capsule (60 mg total) daily by mouth. Needs office visit prior to anymore refills.   estradiol 1 MG tablet Commonly known as:  ESTRACE Take 2 mg  daily by mouth.   Gabapentin Enacarbil ER 300 MG Tbcr Commonly known as:  HORIZANT TAKE 2 TABLETS BY MOUTH EVERY DAY AT 5PM WITH FOOD   meloxicam 15 MG tablet Commonly known as:  MOBIC Take 1 tablet (15 mg total) daily by mouth.   NATURE-THROID 48.75 MG Tabs Generic drug:  Thyroid TAKE 2 TABLETS EVERY MORNING ON AN EMPTY STOMACH   progesterone 100 MG capsule Commonly known as:  PROMETRIUM Take 100 mg by mouth at bedtime.   rOPINIRole 4 MG 24 hr tablet Commonly known as:  REQUIP XL Take 1 tablet (4 mg total) by mouth at bedtime.   rOPINIRole 2 MG 24 hr tablet Commonly known as:  REQUIP XL Take 1 tablet (2 mg total) by mouth daily.   telmisartan-hydrochlorothiazide 80-25 MG tablet Commonly known as:  MICARDIS HCT Take 1 tablet daily by mouth.       All past medical history, surgical history, allergies, family history, immunizations andmedications were updated in the EMR today and reviewed under the history and medication portions of their EMR.     ROS: Negative, with the exception of above mentioned in HPI   Objective:  BP 134/87 (BP Location: Right Arm, Patient Position: Sitting, Cuff Size: Large)   Pulse 85   Temp 98.7 F (37.1 C)   Resp 20   Wt 247 lb 12 oz (112.4 kg)   SpO2 96%   BMI 42.53 kg/m  Body mass index is 42.53 kg/m. Gen: Afebrile. No acute distress. Nontoxic in appearance, well developed, well nourished. Fatigued appearing.  HENT: AT. [place]. Bilateral TM visualized without erythema. MMM, no oral lesions. Bilateral nares with erythema and drainage. Throat without erythema or exudates. Hoarseness, cough present.  Eyes:Pupils Equal Round Reactive to light, Extraocular movements intact,  Conjunctiva without redness, discharge or icterus. Neck/lymp/endocrine: Supple,no lymphadenopathy CV: RRR no murmur Chest: CTAB, no wheeze or crackles.  Abd: Soft. NTND. BS present. No Masses palpated.  Skin: no rashes, purpura or petechiae.  Neuro: Normal gait. PERLA.  EOMi. Alert. Oriented x3  Psych: Normal affect, dress and demeanor. Normal speech. Normal thought content and judgment.  No exam data present No results found. No results found for this or any previous visit (from the past 24 hour(s)).  Assessment/Plan: [name] [name] is a 51 y.o. female present for OV for  Wheezing Cough Bronchitis - rest, hydrate. - wheezing improved with duoneb tx in office.  - Prescribed doxycyline and prednisone burst.  - use  flonase and mucinex DM - tylenol/advil for fever or discomfort.  - F/U PRN  Reviewed expectations re: course of current medical issues.  Discussed self-management of symptoms.  Outlined signs and symptoms indicating need for more acute intervention.  Patient verbalized understanding and all questions were answered.  Patient received an After-Visit Summary.    No orders of the defined types were placed in this encounter.  Note is dictated utilizing voice recognition software. Although note has been proof read prior to signing, occasional typographical errors still can be missed. If any questions arise, please do not hesitate to call for verification.   electronically signed by:  Howard Pouch, DO  [place]

## 2017-01-25 NOTE — Addendum Note (Signed)
Addended by: Leota Jacobsen on: 01/25/2017 02:55 PM   Modules accepted: Orders

## 2017-01-25 NOTE — Patient Instructions (Signed)
-   rest, hydrate. - Prescribed doxycyline and prednisone burst.  - use flonase and mucinex DM - tylenol/advil for fever or discomfort.  - F/U in 2 weeks if not improving.   Acute Bronchitis, Adult Acute bronchitis is when air tubes (bronchi) in the lungs suddenly get swollen. The condition can make it hard to breathe. It can also cause these symptoms:  A cough.  Coughing up clear, yellow, or green mucus.  Wheezing.  Chest congestion.  Shortness of breath.  A fever.  Body aches.  Chills.  A sore throat.  Follow these instructions at home: Medicines  Take over-the-counter and prescription medicines only as told by your doctor.  If you were prescribed an antibiotic medicine, take it as told by your doctor. Do not stop taking the antibiotic even if you start to feel better. General instructions  Rest.  Drink enough fluids to keep your pee (urine) clear or pale yellow.  Avoid smoking and secondhand smoke. If you smoke and you need help quitting, ask your doctor. Quitting will help your lungs heal faster.  Use an inhaler, cool mist vaporizer, or humidifier as told by your doctor.  Keep all follow-up visits as told by your doctor. This is important. How is this prevented? To lower your risk of getting this condition again:  Wash your hands often with soap and water. If you cannot use soap and water, use hand sanitizer.  Avoid contact with people who have cold symptoms.  Try not to touch your hands to your mouth, nose, or eyes.  Make sure to get the flu shot every year.  Contact a doctor if:  Your symptoms do not get better in 2 weeks. Get help right away if:  You cough up blood.  You have chest pain.  You have very bad shortness of breath.  You become dehydrated.  You faint (pass out) or keep feeling like you are going to pass out.  You keep throwing up (vomiting).  You have a very bad headache.  Your fever or chills gets worse. This information is  not intended to replace advice given to you by your health care provider. Make sure you discuss any questions you have with your health care provider. Document Released: 08/05/2007 Document Revised: 09/25/2015 Document Reviewed: 08/07/2015 Elsevier Interactive Patient Education  2017 [employer].

## 2017-02-01 ENCOUNTER — Telehealth: Payer: Self-pay | Admitting: *Deleted

## 2017-02-01 NOTE — Telephone Encounter (Signed)
Spoke with patient reviewed information and scheduled patient to be seen 02/02/17 she needed afternoon appt since she has to take her sister to a morning appt.

## 2017-02-01 NOTE — Telephone Encounter (Signed)
If she is worsening she would need to be seen and get a breathing treatment and consider cxr to ensure she hs not progressed to pneumonia.  - There is nothing more to call in for her that would be beneficial unless she has pneumonia.  She was treated with Doxycyline, prednisone and she has an inhaler she is using.

## 2017-02-01 NOTE — Telephone Encounter (Signed)
Copied from [place]. Topic: Inquiry >> Feb 01, 2017  8:52 AM Conception Chancy, NT wrote: Reason for CRM: patient states she was seen in the office Monday 11/26. She feels as if she is worse and does not feel any better. She would like to know if there is something else the doctor could call in for her. States her wheezing is worse. Please call pt back with advice   Called patient she said she is not feeling any better she is wheezing she states she has an Inhaler but its not helping. She has finished the prednisone but still has a few days of doxycycline. She states it is not helping her and would like some different medication called in . Explained to patient last office note states to follow up if no improvement but patient wanted to see if Dr Raoul Pitch could just call in something since she was just seen 1 week ago for this.

## 2017-02-02 ENCOUNTER — Ambulatory Visit (INDEPENDENT_AMBULATORY_CARE_PROVIDER_SITE_OTHER): Payer: BLUE CROSS/BLUE SHIELD | Admitting: Family Medicine

## 2017-02-02 ENCOUNTER — Encounter: Payer: Self-pay | Admitting: Family Medicine

## 2017-02-02 VITALS — BP 113/72 | HR 72 | Temp 98.0°F | Wt 247.8 lb

## 2017-02-02 DIAGNOSIS — J40 Bronchitis, not specified as acute or chronic: Secondary | ICD-10-CM | POA: Insufficient documentation

## 2017-02-02 DIAGNOSIS — R062 Wheezing: Secondary | ICD-10-CM | POA: Diagnosis not present

## 2017-02-02 MED ORDER — IPRATROPIUM-ALBUTEROL 0.5-2.5 (3) MG/3ML IN SOLN
3.0000 mL | Freq: Once | RESPIRATORY_TRACT | Status: AC
  Administered 2017-02-02: 3 mL via RESPIRATORY_TRACT

## 2017-02-02 MED ORDER — NATURE-THROID 48.75 MG PO TABS: ORAL_TABLET | ORAL | 3 refills | Status: DC

## 2017-02-02 MED ORDER — AZITHROMYCIN 250 MG PO TABS: ORAL_TABLET | ORAL | 0 refills | Status: DC

## 2017-02-02 MED ORDER — PREDNISONE 20 MG PO TABS: ORAL_TABLET | ORAL | 0 refills | Status: DC

## 2017-02-02 NOTE — Patient Instructions (Signed)
Start z-pack and prednisone taper.  This will take some time. If worsening cough or fever.  Continue albuterol inhaler as needed.  Continue daily antihistamine, flonase and mucinex.   I have also called in your thyroid medication.

## 2017-02-02 NOTE — Progress Notes (Signed)
[name] [name] , 07-16-1965, 51 y.o., female MRN: [id number] Patient Care Team    Relationship Specialty Notifications Start End  [name], DO PCP - General Family Medicine  07/07/16   Ob/Gyn, [name]    07/08/16   Wilford Corner, MD Consulting Physician Gastroenterology  07/08/16   [name], MD Consulting Physician Neurology  07/08/16   [name], MD Attending Physician Endocrinology  07/08/16    Comment: Pt established with Si Raider, NP at this location. - [facility]- [facility]    Chief Complaint  Patient presents with  . URI     Subjective:  Pt returns today with persistent symptoms of bronchitis. She has taken her doxycyline for 7 days, 3 days left and finished her 5 days of prednisone burst.  She denies fever, chills, nausea or vomit. She feels her chest is burning and tight. She reports she has an albuterol inhaler at home and has been using 2 puffs about 3-4 x a day. She has been using flonase and mucinex. She has been unable to sleep from cough. She states "it normally take 2 abx to treat  Bronchitis" for her. She feels she initially with starting to get better, but then has intermittently felt worse. Non-smoker  Prior note 01/25/2017:  Pt presents for an OV with complaints of cough, chest congestion, wheezing, headache, vomit x1, fatigue, pain with deep inspiration of 3-4 days duration.  Pt reports the symptoms started with a sore throat first.  Pt has tried OTC cold and sinus to ease their symptoms.   Hypothyroid:  Pt reports she normally goes to integrative medicine for her thyroid medication. She has not taken it in about a week because she was not able to go for follow up secondary to being ill and taking care of her family members. Her last TSH was 06/2016 and normal on current dose per patient. She is asking if we can refill that for her today.   Depression screen [facility] 2/9 01/06/2017 07/07/2016  Decreased Interest 0 0  Down, Depressed,  Hopeless 0 0  PHQ - 2 Score 0 0  Altered sleeping 3 -  Tired, decreased energy 0 -  Change in appetite 0 -  Feeling bad or failure about yourself  0 -  Trouble concentrating 0 -  Moving slowly or fidgety/restless 0 -  Suicidal thoughts 0 -  PHQ-9 Score 3 -  Difficult doing work/chores Not difficult at all -    Allergies  Allergen Reactions  . Bee Venom Swelling and Anaphylaxis  . Biaxin [Clarithromycin] Diarrhea and Rash  . Gabapentin     Nausea  . Abilify [Aripiprazole] Other (See Comments)    MOOD SWING  . Buspar [Buspirone] Diarrhea  . Lisinopril Cough and Other (See Comments)    Cough  . Maxzide [Triamterene-Hctz] Rash  . Phentermine Other (See Comments)    MOOD CHANGE   . Seroquel [Quetiapine Fumarate] Other (See Comments)    MOOD SWINGS   Social History   Tobacco Use  . Smoking status: Never Smoker  . Smokeless tobacco: Never Used  Substance Use Topics  . Alcohol use: No    Alcohol/week: 0.0 oz   Past Medical History:  Diagnosis Date  . Anxiety 01/12/2013  . Asthma   . Chicken pox   . Chronic insomnia 11/24/2016  . Clostridium difficile infection 04/02/2014, 02/06/2014  . Depression   . Dyslipidemia   . HTN (hypertension)   . Hypothyroidism   . Migraine   .  Multiple allergies   . Obesity   . RLS (restless legs syndrome)    Past Surgical History:  Procedure Laterality Date  . BREAST BIOPSY  [id number]   x 2 benign lesion  . CERVICAL DISCECTOMY  2003  . CERVICAL FUSION  2010   x2  . Hand fracture surgery    . LUMBAR FUSION  2016  . VAGINAL HYSTERECTOMY  2009   Family History  Problem Relation Age of Onset  . Hypertension Father   . Diabetes Father   . Heart disease Father   . Hearing loss Father   . Diabetes Brother   . Arthritis Mother   . Liver cancer Mother   . COPD Mother   . Mental illness Mother   . Diabetes Mother   . Hearing loss Mother   . Heart disease Mother    Allergies as of 02/02/2017      Reactions   Bee Venom  Swelling, Anaphylaxis   Biaxin [clarithromycin] Diarrhea, Rash   Gabapentin    Nausea   Abilify [aripiprazole] Other (See Comments)   MOOD SWING   Buspar [buspirone] Diarrhea   Lisinopril Cough, Other (See Comments)   Cough   Maxzide [triamterene-hctz] Rash   Phentermine Other (See Comments)   MOOD CHANGE   Seroquel [quetiapine Fumarate] Other (See Comments)   MOOD SWINGS      Medication List        Accurate as of 02/02/17  3:06 PM. Always use your most recent med list.          acetaminophen 500 MG tablet Commonly known as:  TYLENOL Take 1,000 mg by mouth daily.   buPROPion 300 MG 24 hr tablet Commonly known as:  WELLBUTRIN XL Take 1 tablet (300 mg total) daily by mouth.   doxycycline 100 MG tablet Commonly known as:  VIBRA-TABS Take 1 tablet (100 mg total) by mouth 2 (two) times daily.   DULoxetine 60 MG capsule Commonly known as:  CYMBALTA Take 1 capsule (60 mg total) daily by mouth. Needs office visit prior to anymore refills.   estradiol 1 MG tablet Commonly known as:  ESTRACE Take 2 mg daily by mouth.   Gabapentin Enacarbil ER 300 MG Tbcr Commonly known as:  HORIZANT TAKE 2 TABLETS BY MOUTH EVERY DAY AT 5PM WITH FOOD   meloxicam 15 MG tablet Commonly known as:  MOBIC Take 1 tablet (15 mg total) daily by mouth.   NATURE-THROID 48.75 MG Tabs Generic drug:  Thyroid TAKE 2 TABLETS EVERY MORNING ON AN EMPTY STOMACH   progesterone 100 MG capsule Commonly known as:  PROMETRIUM Take 100 mg by mouth at bedtime.   rOPINIRole 4 MG 24 hr tablet Commonly known as:  REQUIP XL Take 1 tablet (4 mg total) by mouth at bedtime.   rOPINIRole 2 MG 24 hr tablet Commonly known as:  REQUIP XL Take 1 tablet (2 mg total) by mouth daily.   telmisartan-hydrochlorothiazide 80-25 MG tablet Commonly known as:  MICARDIS HCT Take 1 tablet daily by mouth.       All past medical history, surgical history, allergies, family history, immunizations andmedications were  updated in the EMR today and reviewed under the history and medication portions of their EMR.     ROS: Negative, with the exception of above mentioned in HPI   Objective:  BP 113/72 (BP Location: Right Arm, Patient Position: Sitting, Cuff Size: Large)   Pulse 72   Temp 98 F (36.7 C)   Wt 247 lb 12  oz (112.4 kg)   SpO2 96%   BMI 42.53 kg/m  Body mass index is 42.53 kg/m.  Gen: Afebrile. No acute distress. And, well-developed, well-nourished, Caucasian female. Appears tired. HENT: AT. [place]. Bilateral TM visualized and normal in appearance. MMM. Bilateral nares without erythema or swelling. Throat without erythema or exudates. Cough present, hoarseness present. Eyes:Pupils Equal Round Reactive to light, Extraocular movements intact,  Conjunctiva without redness, discharge or icterus. Neck/lymp/endocrine: Supple, mild left anterior cervical lymphadenopathy CV: RRR, no edema, +2/4 P  Chest: CTAB, no wheeze or crackles Abd: Soft. NTND. BS present. Neuro:  Normal gait. PERLA. EOMi. Alert. Oriented x3   No exam data present No results found. No results found for this or any previous visit (from the past 24 hour(s)).  Assessment/Plan: NIYANNA ASCH is a 51 y.o. female present for OV for  Wheezing Cough Bronchitis - rest, hydrate.Discussed options with her today. She needs to allow herself more time to heal. Sinus exam has improved. Lung exam still with wheezing today, but improved. Duoneb tx provided  with complete resolution of wheezing.  - agreed to azith and tapered course of prednisone.  - Continue flonase and mucinex DM - If continues pt will need cxr and referral to pulm for formal PFT. May need steroid/controller  inhaler.     Reviewed expectations re: course of current medical issues.  Discussed self-management of symptoms.  Outlined signs and symptoms indicating need for more acute intervention.  Patient verbalized understanding and all questions were  answered.  Patient received an After-Visit Summary.    No orders of the defined types were placed in this encounter.  Note is dictated utilizing voice recognition software. Although note has been proof read prior to signing, occasional typographical errors still can be missed. If any questions arise, please do not hesitate to call for verification.   electronically signed by:  Howard Pouch, DO  [place]

## 2017-02-09 ENCOUNTER — Encounter: Payer: Self-pay | Admitting: Family Medicine

## 2017-02-15 ENCOUNTER — Telehealth: Payer: Self-pay | Admitting: Family Medicine

## 2017-02-15 ENCOUNTER — Encounter: Payer: Self-pay | Admitting: *Deleted

## 2017-02-15 ENCOUNTER — Other Ambulatory Visit: Payer: Self-pay | Admitting: *Deleted

## 2017-02-15 DIAGNOSIS — F339 Major depressive disorder, recurrent, unspecified: Secondary | ICD-10-CM

## 2017-02-15 MED ORDER — THYROID 90 MG PO TABS: 90.0000 mg | ORAL_TABLET | Freq: Every day | ORAL | 3 refills | Status: DC

## 2017-02-15 MED ORDER — BUPROPION HCL ER (XL) 300 MG PO TB24: 300.0000 mg | ORAL_TABLET | Freq: Every day | ORAL | 1 refills | Status: DC

## 2017-02-15 NOTE — Telephone Encounter (Signed)
Patient reports her nature thyroid 48.75 mg tablets are on back order, she takes a total of 97.5 mg a day. Pt  Has been on armour thyroid in the past and is willing to to go back to that brand. Prescribed equivalent dosage.   Sent to [facility] in [place] (location dosage is available per pt)

## 2017-04-26 ENCOUNTER — Encounter: Payer: Self-pay | Admitting: Family Medicine

## 2017-04-26 NOTE — Telephone Encounter (Signed)
I sent message back to pt advising Dr. Raoul Pitch was out of the office today and tomorrow and that we would contact pt once Dr. Raoul Pitch review/responds to her message. (I have not sent this to Dr. Raoul Pitch, wanted Vinnie Level to review first.)

## 2017-04-28 ENCOUNTER — Telehealth: Payer: Self-pay | Admitting: Family Medicine

## 2017-04-28 NOTE — Telephone Encounter (Signed)
Spoke with patient reviewed information scheduled patient to be seen.

## 2017-04-28 NOTE — Telephone Encounter (Signed)
Please call pt: - she is requesting estrogen and progesterone "refills". We have not provided either to her in the past and she had been receiving through integrative medicine. If she is desiring for me to take over management of the condition she was prescribed medications, she will need to be seen for the issue in which she is taken the medications.  In the future, if on these meds chronically, it can be refilled on a yearly basis during her CPE. However, since I have not managed this condition in the past, she would have to be seen prior to prescribing

## 2017-05-03 ENCOUNTER — Encounter: Payer: Self-pay | Admitting: Family Medicine

## 2017-05-03 ENCOUNTER — Ambulatory Visit (INDEPENDENT_AMBULATORY_CARE_PROVIDER_SITE_OTHER): Payer: BLUE CROSS/BLUE SHIELD | Admitting: Family Medicine

## 2017-05-03 VITALS — BP 126/74 | HR 90 | Temp 98.6°F | Resp 20 | Ht 64.0 in | Wt 253.5 lb

## 2017-05-03 DIAGNOSIS — N951 Menopausal and female climacteric states: Secondary | ICD-10-CM | POA: Diagnosis not present

## 2017-05-03 MED ORDER — ESTRADIOL 2 MG PO TABS: 2.0000 mg | ORAL_TABLET | Freq: Every day | ORAL | 3 refills | Status: DC

## 2017-05-03 NOTE — Patient Instructions (Signed)
Stop progesterone.  Restart estrogen supplement and take daily 3 weeks with 1 week off.  Keep up to date with mammogram.     Menopause and Hormone Replacement Therapy What is hormone replacement therapy? Hormone replacement therapy (HRT) is the use of artificial (synthetic) hormones to replace hormones that your body stops producing during menopause. Menopause is the normal time of life when menstrual periods stop completely and the ovaries stop producing the female hormones estrogen and progesterone. This lack of hormones can affect your health and cause undesirable symptoms. HRT can relieve some of those symptoms. What are my options for HRT? HRT may consist of the synthetic hormones estrogen and progestin, or it may consist of only estrogen (estrogen-only therapy). You and your health care provider will decide which form of HRT is best for you. If you choose to be on HRT and you have a uterus, estrogen and progestin are usually prescribed. Estrogen-only therapy is used for women who do not have a uterus. Possible options for taking HRT include:  Pills.  Patches.  Gels.  Sprays.  Vaginal cream.  Vaginal rings.  Vaginal inserts.  The amount of hormone(s) that you take and how long you take the hormone(s) varies depending on your individual health. It is important to:  Begin HRT with the lowest possible dosage.  Stop HRT as soon as your health care provider tells you to stop.  Work with your health care provider so that you feel informed and comfortable with your decisions.  What are the benefits of HRT? HRT can reduce the frequency and severity of menopausal symptoms. Benefits of HRT vary depending on the menopausal symptoms that you have, the severity of your symptoms, and your overall health. HRT may help to improve the following menopausal symptoms:  Hot flashes and night sweats. These are sudden feelings of heat that spread over the face and body. The skin may turn red,  like a blush. Night sweats are hot flashes that happen while you are sleeping or trying to sleep.  Bone loss (osteoporosis). The body loses calcium more quickly after menopause, causing the bones to become weaker. This can increase the risk for bone breaks (fractures).  Vaginal dryness. The lining of the vagina can become thin and dry, which can cause pain during sexual intercourse or cause infection, burning, or itching.  Urinary tract infections.  Urinary incontinence. This is a decreased ability to control when you urinate.  Irritability.  Short-term memory problems.  What are the risks of HRT? Risks of HRT vary depending on your individual health and medical history. Risks of HRT also depend on whether you receive both estrogen and progestin or you receive estrogen only.HRT may increase the risk of:  Spotting. This is when a small amount of bloodleaks from the vagina unexpectedly.  Endometrial cancer. This cancer is in the lining of the uterus (endometrium).  Breast cancer.  Increased density of breast tissue. This can make it harder to find breast cancer on a breast X-ray (mammogram).  Stroke.  Heart attack.  Blood clots.  Gallbladder disease.  Risks of HRT can increase if you have any of the following conditions:  Endometrial cancer.  Liver disease.  Heart disease.  Breast cancer.  History of blood clots.  History of stroke.  How should I care for myself while I am on HRT?  Take over-the-counter and prescription medicines only as told by your health care provider.  Get mammograms, pelvic exams, and medical checkups as often as told by  your health care provider.  Have Pap tests done as often as told by your health care provider. A Pap test is sometimes called a Pap smear. It is a screening test that is used to check for signs of cancer of the cervix and vagina. A Pap test can also identify the presence of infection or precancerous changes. Pap tests may be  done: ? Every 3 years, starting at age 45. ? Every 5 years, starting after age 74, in combination with testing for human papillomavirus (HPV). ? More often or less often depending on other medical conditions you have, your age, and other risk factors.  It is your responsibility to get your Pap test results. Ask your health care provider or the department performing the test when your results will be ready.  Keep all follow-up visits as told by your health care provider. This is important. When should I seek medical care? Talk with your health care provider if:  You have any of these: ? Pain or swelling in your legs. ? Shortness of breath. ? Chest pain. ? Lumps or changes in your breasts or armpits. ? Slurred speech. ? Pain, burning, or bleeding when you urine.  You develop any of these: ? Unusual vaginal bleeding. ? Dizziness or headaches. ? Weakness or numbness in any part of your arms or legs. ? Pain in your abdomen.  This information is not intended to replace advice given to you by your health care provider. Make sure you discuss any questions you have with your health care provider. Document Released: 11/15/2002 Document Revised: 01/14/2016 Document Reviewed: 08/20/2014 Elsevier Interactive Patient Education  2017 [employer].

## 2017-05-03 NOTE — Progress Notes (Signed)
[name] [name] , [birth year], 52 y.o., female MRN: [id number] Patient Care Team    Relationship Specialty Notifications Start End  [name], DO PCP - General Family Medicine  07/07/16   Ob/Gyn, [name]    07/08/16   Wilford Corner, MD Consulting Physician Gastroenterology  07/08/16   [name], MD Consulting Physician Neurology  07/08/16   [name], MD Attending Physician Endocrinology  07/08/16    Comment: Pt established with Si Raider, NP at this location. - [facility]- thyroid    Chief Complaint  Patient presents with  . hormone replacement     Subjective: Pt presents for an OV to discuss management of her hormone replacement therapy. She has been prescribed progesterone 100 mg QHS and estrace 2 mg QD. She has had a hysterectomy. She reports medications were prescribed to help relieve menopausal symptoms of hot flashes, vaginal dryness, hair loss and insomnia. She has been prescribed these medications for 1.5 years. She feels symptoms are  controlled on medications. Her last mammogram was completed 05/15/2016 birads 2. She has a Fhx of heart disease. Her chronic medical conditions consist of HTN, HLD, obesity, elevated a1c and insomnia.   Depression screen [facility] 2/9 01/06/2017 07/07/2016  Decreased Interest 0 0  Down, Depressed, Hopeless 0 0  PHQ - 2 Score 0 0  Altered sleeping 3 -  Tired, decreased energy 0 -  Change in appetite 0 -  Feeling bad or failure about yourself  0 -  Trouble concentrating 0 -  Moving slowly or fidgety/restless 0 -  Suicidal thoughts 0 -  PHQ-9 Score 3 -  Difficult doing work/chores Not difficult at all -    Allergies  Allergen Reactions  . Bee Venom Swelling and Anaphylaxis  . Biaxin [Clarithromycin] Diarrhea and Rash  . Gabapentin     Nausea  . Abilify [Aripiprazole] Other (See Comments)    MOOD SWING  . Buspar [Buspirone] Diarrhea  . Lisinopril Cough and Other (See Comments)    Cough  . Maxzide  [Triamterene-Hctz] Rash  . Phentermine Other (See Comments)    MOOD CHANGE   . Seroquel [Quetiapine Fumarate] Other (See Comments)    MOOD SWINGS   Social History   Tobacco Use  . Smoking status: Never Smoker  . Smokeless tobacco: Never Used  Substance Use Topics  . Alcohol use: No    Alcohol/week: 0.0 oz   Past Medical History:  Diagnosis Date  . Anxiety 01/12/2013  . Asthma   . Chicken pox   . Chronic insomnia 11/24/2016  . Clostridium difficile infection 04/02/2014, 02/06/2014  . Depression   . Dyslipidemia   . HTN (hypertension)   . Hypothyroidism   . Migraine   . Multiple allergies   . Obesity   . RLS (restless legs syndrome)    Past Surgical History:  Procedure Laterality Date  . BREAST BIOPSY  [id number]   x 2 benign lesion  . CERVICAL DISCECTOMY  2003  . CERVICAL FUSION  2010   x2  . Hand fracture surgery    . LUMBAR FUSION  2016  . VAGINAL HYSTERECTOMY  2009   Family History  Problem Relation Age of Onset  . Hypertension Father   . Diabetes Father   . Heart disease Father   . Hearing loss Father   . Diabetes Brother   . Arthritis Mother   . Liver cancer Mother   . COPD Mother   . Mental illness Mother   .  Diabetes Mother   . Hearing loss Mother   . Heart disease Mother    Allergies as of 05/03/2017      Reactions   Bee Venom Swelling, Anaphylaxis   Biaxin [clarithromycin] Diarrhea, Rash   Gabapentin    Nausea   Abilify [aripiprazole] Other (See Comments)   MOOD SWING   Buspar [buspirone] Diarrhea   Lisinopril Cough, Other (See Comments)   Cough   Maxzide [triamterene-hctz] Rash   Phentermine Other (See Comments)   MOOD CHANGE   Seroquel [quetiapine Fumarate] Other (See Comments)   MOOD SWINGS      Medication List        Accurate as of 05/03/17  1:58 PM. Always use your most recent med list.          acetaminophen 500 MG tablet Commonly known as:  TYLENOL Take 1,000 mg by mouth daily.   buPROPion 300 MG 24 hr tablet Commonly  known as:  WELLBUTRIN XL Take 1 tablet (300 mg total) by mouth daily.   DULoxetine 60 MG capsule Commonly known as:  CYMBALTA Take 1 capsule (60 mg total) daily by mouth. Needs office visit prior to anymore refills.   estradiol 1 MG tablet Commonly known as:  ESTRACE Take 2 mg daily by mouth.   Gabapentin Enacarbil ER 300 MG Tbcr Commonly known as:  HORIZANT TAKE 2 TABLETS BY MOUTH EVERY DAY AT 5PM WITH FOOD   meloxicam 15 MG tablet Commonly known as:  MOBIC Take 1 tablet (15 mg total) daily by mouth.   progesterone 100 MG capsule Commonly known as:  PROMETRIUM Take 100 mg by mouth at bedtime.   rOPINIRole 4 MG 24 hr tablet Commonly known as:  REQUIP XL Take 1 tablet (4 mg total) by mouth at bedtime.   rOPINIRole 2 MG 24 hr tablet Commonly known as:  REQUIP XL Take 1 tablet (2 mg total) by mouth daily.   telmisartan-hydrochlorothiazide 80-25 MG tablet Commonly known as:  MICARDIS HCT Take 1 tablet daily by mouth.   thyroid 90 MG tablet Commonly known as:  ARMOUR THYROID Take 1 tablet (90 mg total) by mouth daily.       All past medical history, surgical history, allergies, family history, immunizations andmedications were updated in the EMR today and reviewed under the history and medication portions of their EMR.     ROS: Negative, with the exception of above mentioned in HPI   Objective:  There were no vitals taken for this visit. There is no height or weight on file to calculate BMI. Gen: Afebrile. No acute distress. Nontoxic in appearance, well developed, well nourished.  HENT: AT. [place]. MMM, no oral lesions. Eyes:Pupils Equal Round Reactive to light, Extraocular movements intact,  Conjunctiva without redness, discharge or icterus. CV: RRR  Chest: CTAB, no wheeze or crackles. Good air movement, normal resp effort.  Neuro:  Normal gait. PERLA. EOMi. Alert. Oriented x3  No exam data present No results found. No results found for this or any previous visit  (from the past 24 hour(s)).  Assessment/Plan: [name] [name] is a 52 y.o. female present for OV for  Menopausal symptoms Discussed options with patient today for hormone replacement. She has had a hysterectomy therefore does not necessarily need progesterone unless there is benefit. Mammogram is up-to-date and will be required yearly while on hormonal therapy. She was counseled on risk of heart disease. Estrace 2 mg daily for 3 weeks on 1 week off prescribed. Discontinue progesterone. Will follow yearly with  her physicals.  Reviewed expectations re: course of current medical issues.  Discussed self-management of symptoms.  Outlined signs and symptoms indicating need for more acute intervention.  Patient verbalized understanding and all questions were answered.  Patient received an After-Visit Summary.    No orders of the defined types were placed in this encounter.    Note is dictated utilizing voice recognition software. Although note has been proof read prior to signing, occasional typographical errors still can be missed. If any questions arise, please do not hesitate to call for verification.   electronically signed by:  Howard Pouch, DO  [place]

## 2017-05-04 ENCOUNTER — Encounter: Payer: Self-pay | Admitting: *Deleted

## 2017-05-04 ENCOUNTER — Telehealth: Payer: Self-pay | Admitting: Family Medicine

## 2017-05-04 DIAGNOSIS — M25569 Pain in unspecified knee: Principal | ICD-10-CM

## 2017-05-04 DIAGNOSIS — G8929 Other chronic pain: Secondary | ICD-10-CM

## 2017-05-04 NOTE — Telephone Encounter (Signed)
Orthopedic referral placed to Dr. Victorino December at [facility] orthopedics

## 2017-05-04 NOTE — Telephone Encounter (Signed)
Copied from [place] [id number]. Topic: Referral - Status >> May 04, 2017  8:29 AM Corie Chiquito, NT wrote: Reason for CRM: Patient calling to let Dr.Kuneff know the name of the orthopedic that she would like to see. His name is Ramonita Lab.He is located at [facility] on [address]. If anyone has any more questions for her she can be reached at [phone]

## 2017-05-24 ENCOUNTER — Encounter: Payer: Self-pay | Admitting: Adult Health

## 2017-05-24 ENCOUNTER — Ambulatory Visit (INDEPENDENT_AMBULATORY_CARE_PROVIDER_SITE_OTHER): Payer: BLUE CROSS/BLUE SHIELD | Admitting: Adult Health

## 2017-05-24 VITALS — BP 133/83 | HR 91 | Ht 64.0 in | Wt 254.6 lb

## 2017-05-24 DIAGNOSIS — F5104 Psychophysiologic insomnia: Secondary | ICD-10-CM | POA: Diagnosis not present

## 2017-05-24 DIAGNOSIS — G2581 Restless legs syndrome: Secondary | ICD-10-CM | POA: Diagnosis not present

## 2017-05-24 MED ORDER — ROPINIROLE HCL 2 MG PO TABS: 2.0000 mg | ORAL_TABLET | Freq: Every day | ORAL | 5 refills | Status: DC

## 2017-05-24 NOTE — Progress Notes (Signed)
I have read the note, and I agree with the clinical assessment and plan.  Brindley Madarang K Helmer Dull   

## 2017-05-24 NOTE — Progress Notes (Signed)
[name] DOB: 02-23-1966  REASON FOR VISIT: follow up- RLS HISTORY FROM: patient  HISTORY OF PRESENT ILLNESS: Today 05/24/17 [name] [name] is a 52 year old female with a history of restless leg syndrome.  She returns today for follow-up.  She reports that she is having issues with restless legs particularly in the mornings.  She states that she still has trouble with insomnia.  She states that she is currently taking Requip extended release 60 mg in the morning and horizant at bedtime.  She reports that she typically will wake up in the middle the night and is unable to go back to sleep.  She reports that she may begin to have restless legs at that time and it carries over to the morning.  She states that there are times that she has doubled her Requip dose in order to get some relief.  She states that she did try taking Benadryl at bedtime but this resulted in drowsiness the next day.  She returns today for an evaluation.  HISTORY 11/24/16 ( copied from Dr. Tobey Grim note): [name] [name] is a 52 year old right-handed white female with a history of obesity and restless leg syndrome. The patient is on Horizant and she is on long-acting Requip taking a total 6 mg at night. The patient does well with this regimen, she has about 2 nights a month where she may have problems with restless legs. Her main issue is chronic insomnia that began about one year ago, the patient has been under some stress with a separation with her husband. The patient will get to sleep fairly well at night, but then she will wake up early in the morning and cannot get back to sleep. The patient is on Cymbalta and Wellbutrin for depression. The patient has not taken anything for sleep. She returns to this office for an evaluation.    REVIEW OF SYSTEMS: Out of a complete 14 system review of symptoms, the patient complains only of the following symptoms, and all other reviewed systems are negative.  Fatigue appetite  change, excessive sweating, shortness of breath, cold intolerance, excessive thirst, excessive eating, restless leg, insomnia, frequent waking, daytime sleepiness, snoring, sleep talking, back pain, walking difficulty, headache, swollen lymph nodes  ALLERGIES: Allergies  Allergen Reactions  . Bee Venom Swelling and Anaphylaxis  . Biaxin [Clarithromycin] Diarrhea and Rash  . Gabapentin     Nausea  . Abilify [Aripiprazole] Other (See Comments)    MOOD SWING  . Buspar [Buspirone] Diarrhea  . Lisinopril Cough and Other (See Comments)    Cough  . Maxzide [Triamterene-Hctz] Rash  . Phentermine Other (See Comments)    MOOD CHANGE   . Seroquel [Quetiapine Fumarate] Other (See Comments)    MOOD SWINGS    HOME MEDICATIONS: Outpatient Medications Prior to Visit  Medication Sig Dispense Refill  . acetaminophen (TYLENOL) 500 MG tablet Take 1,000 mg by mouth daily.    Marland Kitchen buPROPion (WELLBUTRIN XL) 300 MG 24 hr tablet Take 1 tablet (300 mg total) by mouth daily. 90 tablet 1  . DULoxetine (CYMBALTA) 60 MG capsule Take 1 capsule (60 mg total) daily by mouth. Needs office visit prior to anymore refills. 90 capsule 1  . estradiol (ESTRACE) 2 MG tablet Take 1 tablet (2 mg total) by mouth daily. 90 tablet 3  . Gabapentin Enacarbil ER (HORIZANT) 300 MG TBCR TAKE 2 TABLETS BY MOUTH EVERY DAY AT 5PM WITH FOOD 60 tablet 11  . meloxicam (MOBIC) 15 MG tablet Take 1  tablet (15 mg total) daily by mouth. 90 tablet 1  . rOPINIRole (REQUIP XL) 2 MG 24 hr tablet Take 1 tablet (2 mg total) by mouth daily. 90 tablet 3  . rOPINIRole (REQUIP XL) 4 MG 24 hr tablet Take 1 tablet (4 mg total) by mouth at bedtime. (Patient taking differently: Take 4 mg daily by mouth. ) 90 tablet 3  . telmisartan-hydrochlorothiazide (MICARDIS HCT) 80-25 MG tablet Take 1 tablet daily by mouth. 90 tablet 1  . thyroid (ARMOUR THYROID) 90 MG tablet Take 1 tablet (90 mg total) by mouth daily. 90 tablet 3   No facility-administered medications  prior to visit.     PAST MEDICAL HISTORY: Past Medical History:  Diagnosis Date  . Anxiety 01/12/2013  . Asthma   . Chicken pox   . Chronic insomnia 11/24/2016  . Clostridium difficile infection 04/02/2014, 02/06/2014  . Depression   . Dyslipidemia   . HTN (hypertension)   . Hypothyroidism   . Migraine   . Multiple allergies   . Obesity   . RLS (restless legs syndrome)     PAST SURGICAL HISTORY: Past Surgical History:  Procedure Laterality Date  . BREAST BIOPSY  [id number]   x 2 benign lesion  . CERVICAL DISCECTOMY  2003  . CERVICAL FUSION  2010   x2  . Hand fracture surgery    . LUMBAR FUSION  2016  . VAGINAL HYSTERECTOMY  2009    FAMILY HISTORY: Family History  Problem Relation Age of Onset  . Hypertension Father   . Diabetes Father   . Heart disease Father   . Hearing loss Father   . Diabetes Brother   . Arthritis Mother   . Liver cancer Mother   . COPD Mother   . Mental illness Mother   . Diabetes Mother   . Hearing loss Mother   . Heart disease Mother     SOCIAL HISTORY: Social History   Socioeconomic History  . Marital status: Married    Spouse name: [name]  . Number of children: 2  . Years of education: COLLEGE  . Highest education level: Not on file  Occupational History  . Occupation: OWNER    Employer: [employer]  Social Needs  . Financial resource strain: Not on file  . Food insecurity:    Worry: Not on file    Inability: Not on file  . Transportation needs:    Medical: Not on file    Non-medical: Not on file  Tobacco Use  . Smoking status: Never Smoker  . Smokeless tobacco: Never Used  Substance and Sexual Activity  . Alcohol use: No    Alcohol/week: 0.0 oz  . Drug use: No  . Sexual activity: Yes    Partners: Male    Birth control/protection: None  Lifestyle  . Physical activity:    Days per week: Not on file    Minutes per session: Not on file  . Stress: Not on file  Relationships  . Social connections:    Talks on  phone: Not on file    Gets together: Not on file    Attends religious service: Not on file    Active member of club or organization: Not on file    Attends meetings of clubs or organizations: Not on file    Relationship status: Not on file  . Intimate partner violence:    Fear of current or ex partner: Not on file    Emotionally abused: Not on file  Physically abused: Not on file    Forced sexual activity: Not on file  Other Topics Concern  . Not on file  Social History Narrative   Patient is married [name])  2 children [name] and [place])   Patient is right handed.   Patient has a college education. 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    Patient drinks 1 cups daily. Uses herbal remedies, takes a daily vitamin.   Wears her seatbelt, smoke detector in the home.      PHYSICAL EXAM  There were no vitals filed for this visit. There is no height or weight on file to calculate BMI.  Generalized: Well developed, in no acute distress   Neurological examination  Mentation: Alert oriented to time, place, history taking. Follows all commands speech and language fluent Cranial nerve II-XII: Pupils were equal round reactive to light. Extraocular movements were full, visual field were full on confrontational test. Facial sensation and strength were normal. Uvula tongue midline. Head turning and shoulder shrug  were normal and symmetric. Motor: The motor testing reveals 5 over 5 strength of all 4 extremities. Good symmetric motor tone is noted throughout.  Sensory: Sensory testing is intact to soft touch on all 4 extremities. No evidence of extinction is noted.  Coordination: Cerebellar testing reveals good finger-nose-finger and heel-to-shin bilaterally.  Gait and station: Gait is normal. Tandem gait is normal. Romberg is negative. No drift is seen.  Reflexes: Deep tendon reflexes are symmetric and normal bilaterally.   DIAGNOSTIC DATA (LABS, IMAGING, TESTING) - I reviewed patient records, labs,  notes, testing and imaging myself where available.  Lab Results  Component Value Date   WBC 8.9 01/06/2017   HGB 14.4 01/06/2017   HCT 43.0 01/06/2017   MCV 92.6 01/06/2017   PLT 330.0 01/06/2017      Component Value Date/Time   NA 140 01/06/2017 1412   NA 141 04/03/2015   K 3.7 01/06/2017 1412   CL 102 01/06/2017 1412   CO2 30 01/06/2017 1412   GLUCOSE 92 01/06/2017 1412   BUN 17 01/06/2017 1412   BUN 20 04/03/2015   CREATININE 0.62 01/06/2017 1412   CALCIUM 9.8 01/06/2017 1412   PROT 6.8 01/06/2017 1412   ALBUMIN 4.1 01/06/2017 1412   AST 15 01/06/2017 1412   ALT 25 01/06/2017 1412   ALKPHOS 75 01/06/2017 1412   BILITOT 0.3 01/06/2017 1412   GFRNONAA >60 11/11/2010 0956   GFRAA >60 11/11/2010 0956   Lab Results  Component Value Date   CHOL 164 04/03/2015   HDL 51 04/03/2015   LDLCALC 91 04/03/2015   TRIG 112 04/03/2015   CHOLHDL 4.1 02/09/2009   Lab Results  Component Value Date   HGBA1C 6.1 01/06/2017   No results found for: VITAMINB12 Lab Results  Component Value Date   TSH 5.18 07/08/2016      ASSESSMENT AND PLAN 51 y.o. year old female  has a past medical history of Anxiety (01/12/2013), Asthma, Chicken pox, Chronic insomnia (11/24/2016), Clostridium difficile infection (04/02/2014, 02/06/2014), Depression, Dyslipidemia, HTN (hypertension), Hypothyroidism, Migraine, Multiple allergies, Obesity, and RLS (restless legs syndrome). here with: 1.  Restless leg  1.  Restless leg syndrome 2.  Chronic insomnia  The patient is having breakthrough symptoms with restless legs.  She will continue taking Requip extended release 4 mg in the morning.  She will take Requip immediate release 2 mg along with at bedtime along with Horizant.  She is advised that she can try taking  Requip immediate release  during the day but this may result in drowsiness. she will discontinue Benadryl. She voiced understanding.  If this is not beneficial for her restless legs she will let me  know.  She will follow-up in 6 months or sooner if needed.  I spent 25 minutes with the patient. 50% of this time was spent discussing her medication   Ward Givens, MSN, NP-C 05/24/2017, 7:57 AM [facility] [address] [place], [place] [address] [phone]

## 2017-05-24 NOTE — Patient Instructions (Addendum)
Your Plan:  Continue  Horizant Continue taking requip ER 4 mg in the morning Stop Requip ER 2 mg  Start Requip immediate release 2 mg at bedtime Not to exceed dose of requip >6 mg Stop Benadryl If your symptoms worsen or you develop new symptoms please let us know.   Thank you for coming to see [place] at [facility]. I hope we have been able to provide you high quality care today.  You may receive a patient satisfaction survey over the next few weeks. We would appreciate your feedback and comments so that we may continue to improve ourselves and the health of our patients.

## 2017-05-27 DIAGNOSIS — M238X1 Other internal derangements of right knee: Secondary | ICD-10-CM | POA: Diagnosis not present

## 2017-05-27 DIAGNOSIS — M238X2 Other internal derangements of left knee: Secondary | ICD-10-CM | POA: Diagnosis not present

## 2017-06-03 DIAGNOSIS — M25561 Pain in right knee: Secondary | ICD-10-CM | POA: Diagnosis not present

## 2017-06-08 DIAGNOSIS — M25561 Pain in right knee: Secondary | ICD-10-CM | POA: Diagnosis not present

## 2017-06-11 DIAGNOSIS — M25561 Pain in right knee: Secondary | ICD-10-CM | POA: Diagnosis not present

## 2017-06-17 DIAGNOSIS — M25561 Pain in right knee: Secondary | ICD-10-CM | POA: Diagnosis not present

## 2017-07-20 ENCOUNTER — Ambulatory Visit (INDEPENDENT_AMBULATORY_CARE_PROVIDER_SITE_OTHER): Payer: BLUE CROSS/BLUE SHIELD | Admitting: Family Medicine

## 2017-07-20 ENCOUNTER — Encounter: Payer: Self-pay | Admitting: Family Medicine

## 2017-07-20 ENCOUNTER — Telehealth: Payer: Self-pay | Admitting: Family Medicine

## 2017-07-20 VITALS — BP 135/88 | HR 95 | Temp 98.8°F | Resp 20 | Ht 64.0 in | Wt 255.0 lb

## 2017-07-20 DIAGNOSIS — E039 Hypothyroidism, unspecified: Secondary | ICD-10-CM

## 2017-07-20 DIAGNOSIS — L659 Nonscarring hair loss, unspecified: Secondary | ICD-10-CM | POA: Insufficient documentation

## 2017-07-20 DIAGNOSIS — R05 Cough: Secondary | ICD-10-CM

## 2017-07-20 DIAGNOSIS — E559 Vitamin D deficiency, unspecified: Secondary | ICD-10-CM

## 2017-07-20 DIAGNOSIS — R059 Cough, unspecified: Secondary | ICD-10-CM

## 2017-07-20 DIAGNOSIS — Z79890 Hormone replacement therapy (postmenopausal): Secondary | ICD-10-CM | POA: Insufficient documentation

## 2017-07-20 HISTORY — DX: Hormone replacement therapy: Z79.890

## 2017-07-20 HISTORY — DX: Nonscarring hair loss, unspecified: L65.9

## 2017-07-20 LAB — TSH: TSH: 5.93 u[IU]/mL — ABNORMAL HIGH (ref 0.35–4.50)

## 2017-07-20 LAB — VITAMIN D 25 HYDROXY (VIT D DEFICIENCY, FRACTURES): VITD: 16.85 ng/mL — ABNORMAL LOW (ref 30.00–100.00)

## 2017-07-20 LAB — T4, FREE: Free T4: 0.86 ng/dL (ref 0.60–1.60)

## 2017-07-20 MED ORDER — THYROID 97.5 MG PO TABS: 97.5000 mg | ORAL_TABLET | Freq: Every day | ORAL | 0 refills | Status: DC

## 2017-07-20 MED ORDER — VITAMIN D (ERGOCALCIFEROL) 1.25 MG (50000 UNIT) PO CAPS: 50000.0000 [IU] | ORAL_CAPSULE | ORAL | 0 refills | Status: DC

## 2017-07-20 NOTE — Patient Instructions (Signed)
Start with labs. If need be can start progesterone (maybe a lower dose).  Continue mucinex DM, if still not improved in the next week then follow up and we can get a cxr.      Please help [place] help you:  We are honored you have chosen [facility] for your [facility]. Below you will find basic instructions that you may need to access in the future. Please help [place] help you by reading the instructions, which cover many of the frequent questions we experience.   Prescription refills and request:  -In order to allow more efficient response time, please call your pharmacy for all refills. They will forward the request electronically to [place]. This allows for the quickest possible response. Request left on a nurse line can take longer to refill, since these are checked as time allows between office patients and other phone calls.  - refill request can take up to 3-5 working days to complete.  - If request is sent electronically and request is appropiate, it is usually completed in 1-2 business days.  - all patients will need to be seen routinely for all chronic medical conditions requiring prescription medications (see follow-up below). If you are overdue for follow up on your condition, you will be asked to make an appointment and we will call in enough medication to cover you until your appointment (up to 30 days).  - all controlled substances will require a face to face visit to request/refill.  - if you desire your prescriptions to go through a new pharmacy, and have an active script at original pharmacy, you will need to call your pharmacy and have scripts transferred to new pharmacy. This is completed between the pharmacy locations and not by your provider.    Results: If any images or labs were ordered, it can take up to 1 week to get results depending on the test ordered and the lab/facility running and resulting the test. - Normal or stable results, which do not need further discussion,  may be released to your mychart immediately with attached note to you. A call may not be generated for normal results. Please make certain to sign up for mychart. If you have questions on how to activate your mychart you can call the front office.  - If your results need further discussion, our office will attempt to contact you via phone, and if unable to reach you after 2 attempts, we will release your abnormal result to your mychart with instructions.  - All results will be automatically released in mychart after 1 week.  - Your provider will provide you with explanation and instruction on all relevant material in your results. Please keep in mind, results and labs may appear confusing or abnormal to the untrained eye, but it does not mean they are actually abnormal for you personally. If you have any questions about your results that are not covered, or you desire more detailed explanation than what was provided, you should make an appointment with your provider to do so.   Our office handles many outgoing and incoming calls daily. If we have not contacted you within 1 week about your results, please check your mychart to see if there is a message first and if not, then contact our office.  In helping with this matter, you help decrease call volume, and therefore allow [place] to be able to respond to patients needs more efficiently.   Acute office visits (sick visit):  An acute  visit is intended for a new problem and are scheduled in shorter time slots to allow schedule openings for patients with new problems. This is the appropriate visit to discuss a new problem. Problems will not be addressed by phone call or Echart message. Appointment is needed if requesting treatment. In order to provide you with excellent quality medical care with proper time for you to explain your problem, have an exam and receive treatment with instructions, these appointments should be limited to one new problem per visit. If you  experience a new problem, in which you desire to be addressed, please make an acute office visit, we save openings on the schedule to accommodate you. Please do not save your new problem for any other type of visit, let us take care of it properly and quickly for you.   Follow up visits:  Depending on your condition(s) your provider will need to see you routinely in order to provide you with quality care and prescribe medication(s). Most chronic conditions (Example: hypertension, Diabetes, depression/anxiety... etc), require visits a couple times a year. Your provider will instruct you on proper follow up for your personal medical conditions and history. Please make certain to make follow up appointments for your condition as instructed. Failing to do so could result in lapse in your medication treatment/refills. If you request a refill, and are overdue to be seen on a condition, we will always provide you with a 30 day script (once) to allow you time to schedule.    Medicare wellness (well visit): - we have a wonderful Nurse Maudie Mercury), that will meet with you and provide you will yearly medicare wellness visits. These visits should occur yearly (can not be scheduled less than 1 calendar year apart) and cover preventive health, immunizations, advance directives and screenings you are entitled to yearly through your medicare benefits. Do not miss out on your entitled benefits, this is when medicare will pay for these benefits to be ordered for you.  These are strongly encouraged by your provider and is the appropriate type of visit to make certain you are up to date with all preventive health benefits. If you have not had your medicare wellness exam in the last 12 months, please make certain to schedule one by calling the office and schedule your medicare wellness with Maudie Mercury as soon as possible.   Yearly physical (well visit):  - Adults are recommended to be seen yearly for physicals. Check with your insurance  and date of your last physical, most insurances require one calendar year between physicals. Physicals include all preventive health topics, screenings, medical exam and labs that are appropriate for gender/age and history. You may have fasting labs needed at this visit. This is a well visit (not a sick visit), new problems should not be covered during this visit (see acute visit).  - Pediatric patients are seen more frequently when they are younger. Your provider will advise you on well child visit timing that is appropriate for your their age. - This is not a medicare wellness visit. Medicare wellness exams do not have an exam portion to the visit. Some medicare companies allow for a physical, some do not allow a yearly physical. If your medicare allows a yearly physical you can schedule the medicare wellness with our nurse Maudie Mercury and have your physical with your provider after, on the same day. Please check with insurance for your full benefits.   Late Policy/No Shows:  - all new patients should arrive 15-30 minutes  earlier than appointment to allow [place] time  to  obtain all personal demographics,  insurance information and for you to complete office paperwork. - All established patients should arrive 10-15 minutes earlier than appointment time to update all information and be checked in .  - In our best efforts to run on time, if you are late for your appointment you will be asked to either reschedule or if able, we will work you back into the schedule. There will be a wait time to work you back in the schedule,  depending on availability.  - If you are unable to make it to your appointment as scheduled, please call 24 hours ahead of time to allow [place] to fill the time slot with someone else who needs to be seen. If you do not cancel your appointment ahead of time, you may be charged a no show fee.

## 2017-07-20 NOTE — Progress Notes (Signed)
[name] [name] , November 20, 1965, 52 y.o., female MRN: [id number] Patient Care Team    Relationship Specialty Notifications Start End  [name], DO PCP - General Family Medicine  07/07/16   Ob/Gyn, [name]    07/08/16   Wilford Corner, MD Consulting Physician Gastroenterology  07/08/16   [name], MD Consulting Physician Neurology  07/08/16   [name], MD Attending Physician Endocrinology  07/08/16    Comment: Pt established with Si Raider, NP at this location. - [facility]- thyroid    Chief Complaint  Patient presents with  . URI    cough x 6 days,  . losing hair     Subjective: Pt presents for an OV with complaints of increased hair loss since stopping the progesterone.  Progesterone was stopped secondary to she has had a hysterectomy and she has gained weight.  Last TSH was 1 year ago and normal.  She reports compliance with Armour thyroid 90 mg.  She states she was forced to get Armour Thyroid because nature thyroid is too difficult to obtain.  She was tried on levothyroxine in the past, and reports she was taken off that medication but she does not remember why.  She is wondering if she can try different thyroid medicine.  Cough: She reports she has had a cough for 6 days.  She feels more fatigued.  Cough is productive but has transition from yellowish tinged to clear.  He denies nausea, vomit, chills or fever.  Depression screen [facility] 2/9 05/03/2017 01/06/2017 07/07/2016  Decreased Interest 0 0 0  Down, Depressed, Hopeless 0 0 0  PHQ - 2 Score 0 0 0  Altered sleeping - 3 -  Tired, decreased energy - 0 -  Change in appetite - 0 -  Feeling bad or failure about yourself  - 0 -  Trouble concentrating - 0 -  Moving slowly or fidgety/restless - 0 -  Suicidal thoughts - 0 -  PHQ-9 Score - 3 -  Difficult doing work/chores - Not difficult at all -    Allergies  Allergen Reactions  . Bee Venom Swelling and Anaphylaxis  . Biaxin [Clarithromycin] Diarrhea  and Rash  . Gabapentin     Nausea  . Abilify [Aripiprazole] Other (See Comments)    MOOD SWING  . Buspar [Buspirone] Diarrhea  . Lisinopril Cough and Other (See Comments)    Cough  . Maxzide [Triamterene-Hctz] Rash  . Phentermine Other (See Comments)    MOOD CHANGE   . Seroquel [Quetiapine Fumarate] Other (See Comments)    MOOD SWINGS   Social History   Tobacco Use  . Smoking status: Never Smoker  . Smokeless tobacco: Never Used  Substance Use Topics  . Alcohol use: No    Alcohol/week: 0.0 oz   Past Medical History:  Diagnosis Date  . Anxiety 01/12/2013  . Asthma   . Chicken pox   . Chronic insomnia 11/24/2016  . Clostridium difficile infection 04/02/2014, 02/06/2014  . Depression   . Dyslipidemia   . HTN (hypertension)   . Hypothyroidism   . Migraine   . Multiple allergies   . Obesity   . RLS (restless legs syndrome)    Past Surgical History:  Procedure Laterality Date  . BREAST BIOPSY  0350,0938   x 2 benign lesion  . CERVICAL DISCECTOMY  2003  . CERVICAL FUSION  2010   x2  . Hand fracture surgery    . LUMBAR FUSION  2016  .  VAGINAL HYSTERECTOMY  2009   Family History  Problem Relation Age of Onset  . Hypertension Father   . Diabetes Father   . Heart disease Father   . Hearing loss Father   . Diabetes Brother   . Arthritis Mother   . Liver cancer Mother   . COPD Mother   . Mental illness Mother   . Diabetes Mother   . Hearing loss Mother   . Heart disease Mother    Allergies as of 07/20/2017      Reactions   Bee Venom Swelling, Anaphylaxis   Biaxin [clarithromycin] Diarrhea, Rash   Gabapentin    Nausea   Abilify [aripiprazole] Other (See Comments)   MOOD SWING   Buspar [buspirone] Diarrhea   Lisinopril Cough, Other (See Comments)   Cough   Maxzide [triamterene-hctz] Rash   Phentermine Other (See Comments)   MOOD CHANGE   Seroquel [quetiapine Fumarate] Other (See Comments)   MOOD SWINGS      Medication List        Accurate as of  07/20/17  1:15 PM. Always use your most recent med list.          buPROPion 300 MG 24 hr tablet Commonly known as:  WELLBUTRIN XL Take 1 tablet (300 mg total) by mouth daily.   DULoxetine 60 MG capsule Commonly known as:  CYMBALTA Take 1 capsule (60 mg total) daily by mouth. Needs office visit prior to anymore refills.   estradiol 2 MG tablet Commonly known as:  ESTRACE Take 1 tablet (2 mg total) by mouth daily.   Gabapentin Enacarbil ER 300 MG Tbcr Commonly known as:  HORIZANT TAKE 2 TABLETS BY MOUTH EVERY DAY AT 5PM WITH FOOD   meloxicam 15 MG tablet Commonly known as:  MOBIC Take 1 tablet (15 mg total) daily by mouth.   rOPINIRole 4 MG 24 hr tablet Commonly known as:  REQUIP XL Take 1 tablet (4 mg total) by mouth at bedtime.   rOPINIRole 2 MG tablet Commonly known as:  REQUIP Take 1 tablet (2 mg total) by mouth at bedtime.   telmisartan-hydrochlorothiazide 80-25 MG tablet Commonly known as:  MICARDIS HCT Take 1 tablet daily by mouth.   thyroid 90 MG tablet Commonly known as:  ARMOUR THYROID Take 1 tablet (90 mg total) by mouth daily.       All past medical history, surgical history, allergies, family history, immunizations andmedications were updated in the EMR today and reviewed under the history and medication portions of their EMR.     ROS: Negative, with the exception of above mentioned in HPI   Objective:  BP 135/88 (BP Location: Right Arm, Patient Position: Sitting, Cuff Size: Large)   Pulse 95   Temp 98.8 F (37.1 C)   Resp 20   Ht 5\' 4"  (1.626 m)   Wt 255 lb (115.7 kg)   SpO2 96%   BMI 43.77 kg/m  Body mass index is 43.77 kg/m. Gen: Afebrile. No acute distress. Nontoxic in appearance, well developed, well nourished.  Morbidly obese. HENT: AT. [place]. Bilateral TM visualized without fullness or erythema. MMM, no oral lesions. Bilateral nares with mild drainage, no erythema or swelling. Throat without erythema or exudates.  Mild cough present.  No  hoarseness.  No tenderness to facial sinus. Eyes:Pupils Equal Round Reactive to light, Extraocular movements intact,  Conjunctiva without redness, discharge or icterus. Neck/lymp/endocrine: Supple, no lymphadenopathy, no thyromegaly CV: RRR, no edema Chest: CTAB, no wheeze or crackles. Good air movement, normal  resp effort.  Abd: Soft.  Obese. NTND. BS positive.  Skin: no rashes, purpura or petechiae.  Neuro:  Normal gait. PERLA. EOMi. Alert. Oriented x3  Psych: Normal affect, dress and demeanor. Normal speech. Normal thought content and judgment.  No exam data present No results found. No results found for this or any previous visit (from the past 24 hour(s)).  Assessment/Plan: OLAMAE FERRARA is a 52 y.o. female present for OV for  Hypothyroidism, unspecified type/vitamin D deficiency/hair loss/hormone replacement Possible the removal of progesterone is causing her to start having hair loss again.  Also cannot rule out it is not her thyroid.  We will retest thyroid levels and vitamin D today.  She is known to be vitamin D deficient in the past. - TSH - T4, free - Vitamin D (25 hydroxy) -Discussed with patient there is really no other option for thyroid replacement. May try levothyroxine again or restart progesterone.  - If desired can refer to endocrine for management.    Cough:  - URI like symptoms. Mucinex DM. Rest . Hydrate. OTC tx. If worsening CXR with appt. Discussed likely viral and she should continue to improve over the next week   Reviewed expectations re: course of current medical issues.  Discussed self-management of symptoms.  Outlined signs and symptoms indicating need for more acute intervention.  Patient verbalized understanding and all questions were answered.  Patient received an After-Visit Summary.    Orders Placed This Encounter  Procedures  . TSH  . T4, free  . Vitamin D (25 hydroxy)     Note is dictated utilizing voice recognition software.  Although note has been proof read prior to signing, occasional typographical errors still can be missed. If any questions arise, please do not hesitate to call for verification.   electronically signed by:  Howard Pouch, DO  [place]

## 2017-07-20 NOTE — Telephone Encounter (Signed)
Please inform patient the following information: Her vitamin D is extremely low at approximately 16.  I have called in a once a week supplement for her to take for 12 weeks.  She should also be taking an over-the-counter regimen of 1000 units daily.  The over-the-counter regimen should be continued even after prescribed supplement is completed.  Her thyroid function is just minimally decreased from prior collection.  She would likely benefit from a very mild increase in her dose.  I have increased her dose to 97.5 of the thyroid (Armour).  Both of the above labs can account for her thinning hair.  However if she still desires to restart the progesterone we can do that now, or wait to see if she receives benefit from the changes in the above medications first.  Please advise. We will need to follow-up in 10 weeks to have thyroid and vitamin D retested-provider appointment.

## 2017-07-21 NOTE — Telephone Encounter (Signed)
Spoke with patient reviewed lab results and instructions. Patient verbalized understanding. Scheduled follow up appt. 

## 2017-09-23 ENCOUNTER — Encounter: Payer: Self-pay | Admitting: *Deleted

## 2017-09-23 ENCOUNTER — Other Ambulatory Visit: Payer: Self-pay | Admitting: *Deleted

## 2017-09-23 DIAGNOSIS — F339 Major depressive disorder, recurrent, unspecified: Secondary | ICD-10-CM

## 2017-09-23 MED ORDER — DULOXETINE HCL 60 MG PO CPEP: 60.0000 mg | ORAL_CAPSULE | Freq: Every day | ORAL | 0 refills | Status: DC

## 2017-10-08 ENCOUNTER — Ambulatory Visit (INDEPENDENT_AMBULATORY_CARE_PROVIDER_SITE_OTHER): Payer: BLUE CROSS/BLUE SHIELD | Admitting: Family Medicine

## 2017-10-08 ENCOUNTER — Encounter: Payer: Self-pay | Admitting: Family Medicine

## 2017-10-08 VITALS — BP 132/88 | HR 84 | Temp 98.3°F | Resp 20 | Ht 64.0 in | Wt 252.5 lb

## 2017-10-08 DIAGNOSIS — E039 Hypothyroidism, unspecified: Secondary | ICD-10-CM

## 2017-10-08 DIAGNOSIS — R350 Frequency of micturition: Secondary | ICD-10-CM | POA: Diagnosis not present

## 2017-10-08 DIAGNOSIS — R7309 Other abnormal glucose: Secondary | ICD-10-CM | POA: Diagnosis not present

## 2017-10-08 DIAGNOSIS — L659 Nonscarring hair loss, unspecified: Secondary | ICD-10-CM

## 2017-10-08 DIAGNOSIS — M2550 Pain in unspecified joint: Secondary | ICD-10-CM

## 2017-10-08 DIAGNOSIS — F339 Major depressive disorder, recurrent, unspecified: Secondary | ICD-10-CM

## 2017-10-08 DIAGNOSIS — I10 Essential (primary) hypertension: Secondary | ICD-10-CM

## 2017-10-08 DIAGNOSIS — E559 Vitamin D deficiency, unspecified: Secondary | ICD-10-CM

## 2017-10-08 DIAGNOSIS — Z79890 Hormone replacement therapy (postmenopausal): Secondary | ICD-10-CM

## 2017-10-08 LAB — POC URINALSYSI DIPSTICK (AUTOMATED)
Bilirubin, UA: NEGATIVE
Blood, UA: NEGATIVE
Glucose, UA: NEGATIVE
Ketones, UA: NEGATIVE
Leukocytes, UA: NEGATIVE
Nitrite, UA: NEGATIVE
Protein, UA: NEGATIVE
Spec Grav, UA: >=1.03 — AB (ref 1.010–1.025)
Urobilinogen, UA: 0.2 E.U./dL
pH, UA: 5.5 (ref 5.0–8.0)

## 2017-10-08 LAB — VITAMIN D 25 HYDROXY (VIT D DEFICIENCY, FRACTURES): VITD: 30.38 ng/mL (ref 30.00–100.00)

## 2017-10-08 LAB — HEMOGLOBIN A1C: Hgb A1c MFr Bld: 6.2 % (ref 4.6–6.5)

## 2017-10-08 LAB — T4, FREE: Free T4: 0.82 ng/dL (ref 0.60–1.60)

## 2017-10-08 LAB — TSH: TSH: 6.03 u[IU]/mL — ABNORMAL HIGH (ref 0.35–4.50)

## 2017-10-08 MED ORDER — DULOXETINE HCL 60 MG PO CPEP
60.0000 mg | ORAL_CAPSULE | Freq: Every day | ORAL | 0 refills | Status: DC
Start: 2017-10-08 — End: 2017-10-18

## 2017-10-08 MED ORDER — PROGESTERONE MICRONIZED 100 MG PO CAPS: 100.0000 mg | ORAL_CAPSULE | ORAL | 3 refills | Status: DC

## 2017-10-08 MED ORDER — MELOXICAM 15 MG PO TABS: 15.0000 mg | ORAL_TABLET | Freq: Every day | ORAL | 1 refills | Status: DC

## 2017-10-08 MED ORDER — BUPROPION HCL ER (XL) 300 MG PO TB24: 300.0000 mg | ORAL_TABLET | Freq: Every day | ORAL | 1 refills | Status: DC

## 2017-10-08 MED ORDER — TELMISARTAN-HCTZ 80-25 MG PO TABS: 1.0000 | ORAL_TABLET | Freq: Every day | ORAL | 1 refills | Status: DC

## 2017-10-08 NOTE — Progress Notes (Signed)
TRAVIS PURK , Feb 21, 1966, 52 y.o., female MRN: [id number] Patient Care Team    Relationship Specialty Notifications Start End  [name], DO PCP - General Family Medicine  07/07/16   Ob/Gyn, [name]    07/08/16   Wilford Corner, MD Consulting Physician Gastroenterology  07/08/16   [name], MD Consulting Physician Neurology  07/08/16   [name], MD Attending Physician Endocrinology  07/08/16    Comment: Pt established with Si Raider, NP at this location. - [facility]- thyroid    Chief Complaint  Patient presents with  . Hypothyroidism  . Follow-up    Vitamin D   . Urinary Frequency     Subjective: TERUKO JOSWICK is a 52 y.o. present for followup on [facility]. Urinary frequency Has noticed urinary frequency with nocturia.  She states she gets up approximately 3-4 times a night to urinate and feels she has started urinating more throughout the day.  She denies any urinary incontinence, fever, chills, nausea, vomit or abdominal pain.  He has have an elevated A1c on her last visit.  Recurrent major depressive disorder, remission status unspecified (HCC) Reports compliance with Wellbutrin 300 mg daily and Cymbalta 60 mg daily.  She is tolerating these medications well without any side effects.  She is in need of refills of these today.  Arthralgia, unspecified joint Tolerating meloxicam.  Working well for her arthralgias.  Needs refills today.  Hypertension/Morbid obesity/elevated a1c: Pt reports compliance with micardis 80-25 milligrams. Patient denies chest pain, shortness of breath, dizziness or lower extremity edema.  Pt does not take a daily baby ASA. Pt is not prescribed statin. BMP:  01/06/2017 within normal limits CBC: 01/06/2017 within normal limits Lipids: 04/03/2015 within normal limits Diet/exercise does not routinely watch diet and exercise RF: Hypertension, obesity,fhx heart disease  Hypothyroidism/Vit D def/Hairloss/HRT:  Last TSH 5.93 with  a normal T4, dose was increased to 97.5 mg of nature thyroid.  Patient reports compliance with medication on an empty stomach daily.  She still has noticed some hair thinning/loss.  However she does admit there is some mild improvement. Prior note:  Pt presents for an OV with complaints of increased hair loss since stopping the progesterone.  Progesterone was stopped secondary to she has had a hysterectomy and she has gained weight.  Last TSH was 1 year ago and normal.  She reports compliance with Armour thyroid 90 mg.  She states she was forced to get Armour Thyroid because nature thyroid is too difficult to obtain.  She was tried on levothyroxine in the past, and reports she was taken off that medication but she does not remember why.  She is wondering if she can try different thyroid medicine.  Depression screen [facility] 2/9 10/08/2017 05/03/2017 01/06/2017 07/07/2016  Decreased Interest 0 0 0 0  Down, Depressed, Hopeless 0 0 0 0  PHQ - 2 Score 0 0 0 0  Altered sleeping 3 - 3 -  Tired, decreased energy 3 - 0 -  Change in appetite 1 - 0 -  Feeling bad or failure about yourself  0 - 0 -  Trouble concentrating - - 0 -  Moving slowly or fidgety/restless 0 - 0 -  Suicidal thoughts 0 - 0 -  PHQ-9 Score 7 - 3 -  Difficult doing work/chores Somewhat difficult - Not difficult at all -    Allergies  Allergen Reactions  . Bee Venom Swelling and Anaphylaxis  . Biaxin [Clarithromycin] Diarrhea and Rash  .  Gabapentin     Nausea  . Abilify [Aripiprazole] Other (See Comments)    MOOD SWING  . Buspar [Buspirone] Diarrhea  . Lisinopril Cough and Other (See Comments)    Cough  . Maxzide [Triamterene-Hctz] Rash  . Phentermine Other (See Comments)    MOOD CHANGE   . Seroquel [Quetiapine Fumarate] Other (See Comments)    MOOD SWINGS   Social History   Tobacco Use  . Smoking status: Never Smoker  . Smokeless tobacco: Never Used  Substance Use Topics  . Alcohol use: No    Alcohol/week: 0.0 standard drinks    Past Medical History:  Diagnosis Date  . Anxiety 01/12/2013  . Asthma   . Chicken pox   . Chronic insomnia 11/24/2016  . Clostridium difficile infection 04/02/2014, 02/06/2014  . Depression   . Dyslipidemia   . HTN (hypertension)   . Hypothyroidism   . Migraine   . Multiple allergies   . Obesity   . RLS (restless legs syndrome)    Past Surgical History:  Procedure Laterality Date  . BREAST BIOPSY  9528,4132   x 2 benign lesion  . CERVICAL DISCECTOMY  2003  . CERVICAL FUSION  2010   x2  . Hand fracture surgery    . LUMBAR FUSION  2016  . VAGINAL HYSTERECTOMY  2009   Family History  Problem Relation Age of Onset  . Hypertension Father   . Diabetes Father   . Heart disease Father   . Hearing loss Father   . Diabetes Brother   . Arthritis Mother   . Liver cancer Mother   . COPD Mother   . Mental illness Mother   . Diabetes Mother   . Hearing loss Mother   . Heart disease Mother    Allergies as of 10/08/2017      Reactions   Bee Venom Swelling, Anaphylaxis   Biaxin [clarithromycin] Diarrhea, Rash   Gabapentin    Nausea   Abilify [aripiprazole] Other (See Comments)   MOOD SWING   Buspar [buspirone] Diarrhea   Lisinopril Cough, Other (See Comments)   Cough   Maxzide [triamterene-hctz] Rash   Phentermine Other (See Comments)   MOOD CHANGE   Seroquel [quetiapine Fumarate] Other (See Comments)   MOOD SWINGS      Medication List        Accurate as of 10/08/17  9:52 AM. Always use your most recent med list.          buPROPion 300 MG 24 hr tablet Commonly known as:  WELLBUTRIN XL Take 1 tablet (300 mg total) by mouth daily.   cholecalciferol 1000 units tablet Commonly known as:  VITAMIN D Take 1,000 Units by mouth daily.   DULoxetine 60 MG capsule Commonly known as:  CYMBALTA Take 1 capsule (60 mg total) by mouth daily. Needs office visit prior to anymore refills.   estradiol 2 MG tablet Commonly known as:  ESTRACE Take 1 tablet (2 mg total) by mouth  daily.   Gabapentin Enacarbil ER 300 MG Tbcr TAKE 2 TABLETS BY MOUTH EVERY DAY AT 5PM WITH FOOD   meloxicam 15 MG tablet Commonly known as:  MOBIC Take 1 tablet (15 mg total) daily by mouth.   rOPINIRole 4 MG 24 hr tablet Commonly known as:  REQUIP XL Take 1 tablet (4 mg total) by mouth at bedtime.   rOPINIRole 2 MG tablet Commonly known as:  REQUIP Take 1 tablet (2 mg total) by mouth at bedtime.   telmisartan-hydrochlorothiazide 80-25 MG  tablet Commonly known as:  MICARDIS HCT Take 1 tablet daily by mouth.   Thyroid 97.5 MG Tabs Take 1 tablet (97.5 mg total) by mouth daily.       All past medical history, surgical history, allergies, family history, immunizations andmedications were updated in the EMR today and reviewed under the history and medication portions of their EMR.     ROS: Negative, with the exception of above mentioned in HPI   Objective:  BP 132/88 (BP Location: Right Arm, Patient Position: Sitting, Cuff Size: Large)   Pulse 84   Temp 98.3 F (36.8 C)   Resp 20   Ht 5\' 4"  (1.626 m)   Wt 252 lb 8 oz (114.5 kg)   SpO2 98%   BMI 43.34 kg/m  Body mass index is 43.34 kg/m. Gen: Afebrile. No acute distress.  Non-toxic. obese Caucasian female. HENT: AT. [place]. MMM.  Eyes:Pupils Equal Round Reactive to light, Extraocular movements intact,  Conjunctiva without redness, discharge or icterus. Neck/lymp/endocrine: Supple, no lymphadenopathy, no thyromegaly CV: RRR no murmur, no edema, +2/4 P posterior tibialis pulses Chest: CTAB, no wheeze or crackles Abd: Soft.  Obese. NTND. BS present.  No masses palpated.  Skin: No rashes, purpura or petechiae.  Neuro:  Normal gait. PERLA. EOMi. Alert. Oriented x3  Psych: Normal affect, dress and demeanor. Normal speech. Normal thought content and judgment.  No exam data present No results found. Results for orders placed or performed in visit on 10/08/17 (from the past 24 hour(s))  POCT Urinalysis Dipstick (Automated)      Status: Abnormal   Collection Time: 10/08/17  9:47 AM  Result Value Ref Range   Color, UA dark yellow    Clarity, UA cloudy    Glucose, UA Negative Negative   Bilirubin, UA negative    Ketones, UA negative    Spec Grav, UA >=1.030 (A) 1.010 - 1.025   Blood, UA negative    pH, UA 5.5 5.0 - 8.0   Protein, UA Negative Negative   Urobilinogen, UA 0.2 0.2 or 1.0 E.U./dL   Nitrite, UA negative    Leukocytes, UA Negative Negative    Assessment/Plan: TENITA CUE is a 52 y.o. female present for OV for  Urinary frequency -Negative urine point-of-care, will send for culture to be certain.  Possibly could be secondary to progression to diabetes condition as well.  Advised her to increase her hydration. - POCT Urinalysis Dipstick (Automated) - Urine Culture  hypothyroidism -Reports compliance with 97.5 mg of nature thyroid daily.  This was increased her last visit.  Recheck levels today. - TSH - T4, free  Vitamin D deficiency -Finished supplement.  Taking 1000 units of vitamin D daily. - Vitamin D (25 hydroxy)  Elevated hemoglobin A1c Exercise encouraged.  Dietary modifications encouraged. - HgB A1c  Recurrent major depressive disorder, remission status unspecified (HCC) Stable.  Continue Cymbalta 60 mg daily and Wellbutrin 300 mg daily, refilled. - DULoxetine (CYMBALTA) 60 MG capsule; Take 1 capsule (60 mg total) by mouth daily. Needs office visit prior to anymore refills.  Dispense: 30 capsule; Refill: 0 - buPROPion (WELLBUTRIN XL) 300 MG 24 hr tablet; Take 1 tablet (300 mg total) by mouth daily.  Dispense: 90 tablet; Refill: 1  Morbid obesity (HCC)/essential hypertension - borderline. Monitor at home and if elevated above 140/90 routinley would consider increasing med--> make follow up - telmisartan-hydrochlorothiazide (MICARDIS HCT) 80-25 MG tablet; Take 1 tablet by mouth daily.  Dispense: 90 tablet; Refill: 1  Hormone replacement therapy (  HRT) Continue estrogen and restart  progesterone 100 mg every other day.  She does not see improvement in 4 to 8 weeks she can take daily if desired.  Hair loss -Restart progesterone. -Keep vitamin D levels normal. -Ensure thyroid levels are normal. -Could consider dermatology referral if desired.  Arthralgia, unspecified joint Stable.  Refills on Mobic. - meloxicam (MOBIC) 15 MG tablet; Take 1 tablet (15 mg total) by mouth daily.  Dispense: 90 tablet; Refill: 1  F/U 6 month sunless needed sooner   Reviewed expectations re: course of current medical issues.  Discussed self-management of symptoms.  Outlined signs and symptoms indicating need for more acute intervention.  Patient verbalized understanding and all questions were answered.  Patient received an After-Visit Summary.    Orders Placed This Encounter  Procedures  . Vitamin D (25 hydroxy)  . TSH  . T4, free  . HgB A1c  . POCT Urinalysis Dipstick (Automated)     Note is dictated utilizing voice recognition software. Although note has been proof read prior to signing, occasional typographical errors still can be missed. If any questions arise, please do not hesitate to call for verification.   electronically signed by:  Howard Pouch, DO  [place]

## 2017-10-08 NOTE — Patient Instructions (Signed)
Refilled medications today.  I sent urine for culture. Once we get all labs back (Vit D, thyroid and diabetes) we call you with plan.  Restart progesterone--> take every other day and see if that is enough. If not you can go back to daily.   Prediabetes Eating Plan Prediabetes-also called impaired glucose tolerance or impaired fasting glucose-is a condition that causes blood sugar (blood glucose) levels to be higher than normal. Following a healthy diet can help to keep prediabetes under control. It can also help to lower the risk of type 2 diabetes and heart disease, which are increased in people who have prediabetes. Along with regular exercise, a healthy diet:  Promotes weight loss.  Helps to control blood sugar levels.  Helps to improve the way that the body uses insulin.  What do I need to know about this eating plan?  Use the glycemic index (GI) to plan your meals. The index tells you how quickly a food will raise your blood sugar. Choose low-GI foods. These foods take a longer time to raise blood sugar.  Pay close attention to the amount of carbohydrates in the food that you eat. Carbohydrates increase blood sugar levels.  Keep track of how many calories you take in. Eating the right amount of calories will help you to achieve a healthy weight. Losing about 7 percent of your starting weight can help to prevent type 2 diabetes.  You may want to follow a Mediterranean diet. This diet includes a lot of vegetables, lean meats or fish, whole grains, fruits, and healthy oils and fats. What foods can I eat? Grains Whole grains, such as whole-wheat or whole-grain breads, crackers, cereals, and pasta. Unsweetened oatmeal. Bulgur. Barley. Quinoa. Brown rice. Corn or whole-wheat flour tortillas or taco shells. Vegetables Lettuce. Spinach. Peas. Beets. Cauliflower. Cabbage. Broccoli. Carrots. Tomatoes. Squash. Eggplant. Herbs. Peppers. Onions. Cucumbers. Brussels sprouts. Fruits Berries.  Bananas. Apples. Oranges. Grapes. Papaya. Mango. Pomegranate. Kiwi. Grapefruit. Cherries. Meats and Other Protein Sources Seafood. Lean meats, such as chicken and [place] or lean cuts of pork and beef. Tofu. Eggs. Nuts. Beans. Dairy Low-fat or fat-free dairy products, such as yogurt, cottage cheese, and cheese. Beverages Water. Tea. Coffee. Sugar-free or diet soda. Seltzer water. Milk. Milk alternatives, such as soy or almond milk. Condiments Mustard. Relish. Low-fat, low-sugar ketchup. Low-fat, low-sugar barbecue sauce. Low-fat or fat-free mayonnaise. Sweets and Desserts Sugar-free or low-fat pudding. Sugar-free or low-fat ice cream and other frozen treats. Fats and Oils Avocado. Walnuts. Olive oil. The items listed above may not be a complete list of recommended foods or beverages. Contact your dietitian for more options. What foods are not recommended? Grains Refined white flour and flour products, such as bread, pasta, snack foods, and cereals. Beverages Sweetened drinks, such as sweet iced tea and soda. Sweets and Desserts Baked goods, such as cake, cupcakes, pastries, cookies, and cheesecake. The items listed above may not be a complete list of foods and beverages to avoid. Contact your dietitian for more information. This information is not intended to replace advice given to you by your health care provider. Make sure you discuss any questions you have with your health care provider. Document Released: 07/03/2014 Document Revised: 07/25/2015 Document Reviewed: 03/14/2014 Elsevier Interactive Patient Education  2017 [employer].

## 2017-10-10 LAB — URINE CULTURE
MICRO NUMBER:: 90949040
Result:: NO GROWTH
SPECIMEN QUALITY:: ADEQUATE

## 2017-10-11 ENCOUNTER — Telehealth: Payer: Self-pay | Admitting: Family Medicine

## 2017-10-11 MED ORDER — LIOTHYRONINE SODIUM 5 MCG PO TABS: 5.0000 ug | ORAL_TABLET | Freq: Every day | ORAL | 3 refills | Status: DC

## 2017-10-11 MED ORDER — THYROID 97.5 MG PO TABS: 97.5000 mg | ORAL_TABLET | Freq: Every day | ORAL | 3 refills | Status: DC

## 2017-10-11 NOTE — Telephone Encounter (Signed)
Message left for patient to return call. Okay for pec nurse to give results and pcp recommendations.

## 2017-10-11 NOTE — Telephone Encounter (Signed)
Her TSH is a little higher than desired, her t4 is normal. She is still feeling fatigue etc, therefor suggest keeping her  Petra Kuba thyroid the same at 97.5 mg a day and trying a different thyroid supplement called cytomel at a very low dose 5 mcg a day- which may help more with her symptoms. Medications called in .  Her urine test was normal.  Vit d is now low normal, can increase OTC vitd to 2000u daily with food.  F/U 4-6 mos will recheck tsh at that time.

## 2017-10-11 NOTE — Telephone Encounter (Signed)
Patient returned call and lab result message given to her with verbal understanding. Follow up appointment also scheduled for 4 months.

## 2017-10-18 ENCOUNTER — Other Ambulatory Visit: Payer: Self-pay

## 2017-10-18 DIAGNOSIS — F339 Major depressive disorder, recurrent, unspecified: Secondary | ICD-10-CM

## 2017-10-18 MED ORDER — DULOXETINE HCL 60 MG PO CPEP: 60.0000 mg | ORAL_CAPSULE | Freq: Every day | ORAL | 3 refills | Status: DC

## 2017-10-18 NOTE — Telephone Encounter (Signed)
Duloxetine resent via escribe.

## 2017-10-19 MED ORDER — DULOXETINE HCL 60 MG PO CPEP: 60.0000 mg | ORAL_CAPSULE | Freq: Every day | ORAL | 0 refills | Status: DC

## 2017-10-19 NOTE — Addendum Note (Signed)
Addended by: Onalee Hua on: 10/19/2017 01:26 PM   Modules accepted: Orders

## 2017-10-19 NOTE — Telephone Encounter (Signed)
Received fax from pharmacy requesting 90 day supply.  Rx eRxed for #90 w/ 0RF. With note to cancel Rx for 30 day supply.

## 2017-11-24 ENCOUNTER — Ambulatory Visit (INDEPENDENT_AMBULATORY_CARE_PROVIDER_SITE_OTHER): Payer: BLUE CROSS/BLUE SHIELD | Admitting: Adult Health

## 2017-11-24 ENCOUNTER — Encounter: Payer: Self-pay | Admitting: Adult Health

## 2017-11-24 VITALS — BP 141/89 | HR 90 | Ht 64.0 in | Wt 258.4 lb

## 2017-11-24 DIAGNOSIS — G2581 Restless legs syndrome: Secondary | ICD-10-CM | POA: Diagnosis not present

## 2017-11-24 NOTE — Progress Notes (Signed)
[name] DOB: 08-Jun-1965  REASON FOR VISIT: follow up HISTORY FROM: patient  HISTORY OF PRESENT ILLNESS: Today 11/24/17:  [name] [name] is a 52 year old female with a history of restless leg syndrome.  She returns today for follow-up.  At the last visit she was waking up in the middle the night with restless leg symptoms.  She states that now the symptoms are not affecting her sleep.  She states that she is having more restless leg symptoms during the day.  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  She continues to take Horizant in the morning and before bedtime.  She takes Requip extended release 4 mg in the morning and Requip immediate release 2mg  at bedtime.  She has not tried taking Requip during the day due to possible drowsiness.  She returns today for an evaluation.  HISTORY 05/24/17 [name] [name] is a 52 year old female with a history of restless leg syndrome.  She returns today for follow-up.  She reports that she is having issues with restless legs particularly in the mornings.  She states that she still has trouble with insomnia.  She states that she is currently taking Requip extended release 60 mg in the morning and horizant at bedtime.  She reports that she typically will wake up in the middle the night and is unable to go back to sleep.  She reports that she may begin to have restless legs at that time and it carries over to the morning.  She states that there are times that she has doubled her Requip dose in order to get some relief.  She states that she did try taking Benadryl at bedtime but this resulted in drowsiness the next day.  She returns today for an evaluation.  REVIEW OF SYSTEMS: Out of a complete 14 system review of symptoms, the patient complains only of the following symptoms, and all other reviewed systems are negative.  Heat intolerance, excessive swelling, restless, frequent waking, daytime sleepiness, back pain,  drooling  ALLERGIES: Allergies  Allergen Reactions  . Bee Venom Swelling and Anaphylaxis  . Biaxin [Clarithromycin] Diarrhea and Rash  . Gabapentin     Nausea  . Abilify [Aripiprazole] Other (See Comments)    MOOD SWING  . Buspar [Buspirone] Diarrhea  . Lisinopril Cough and Other (See Comments)    Cough  . Maxzide [Triamterene-Hctz] Rash  . Phentermine Other (See Comments)    MOOD CHANGE   . Seroquel [Quetiapine Fumarate] Other (See Comments)    MOOD SWINGS    HOME MEDICATIONS: Outpatient Medications Prior to Visit  Medication Sig Dispense Refill  . buPROPion (WELLBUTRIN XL) 300 MG 24 hr tablet Take 1 tablet (300 mg total) by mouth daily. 90 tablet 1  . cholecalciferol (VITAMIN D) 1000 units tablet Take 2,000 Units by mouth daily.     . DULoxetine (CYMBALTA) 60 MG capsule Take 1 capsule (60 mg total) by mouth daily. Needs office visit prior to anymore refills. 90 capsule 0  . estradiol (ESTRACE) 2 MG tablet Take 1 tablet (2 mg total) by mouth daily. 90 tablet 3  . Gabapentin Enacarbil ER (HORIZANT) 300 MG TBCR TAKE 2 TABLETS BY MOUTH EVERY DAY AT 5PM WITH FOOD 60 tablet 11  . liothyronine (CYTOMEL) 5 MCG tablet Take 1 tablet (5 mcg total) by mouth daily. 90 tablet 3  . meloxicam (MOBIC) 15 MG tablet Take 1 tablet (15 mg total) by mouth daily. 90 tablet 1  .  progesterone (PROMETRIUM) 100 MG capsule Take 1 capsule (100 mg total) by mouth every other day. 45 capsule 3  . rOPINIRole (REQUIP XL) 4 MG 24 hr tablet Take 1 tablet (4 mg total) by mouth at bedtime. (Patient taking differently: Take 4 mg daily by mouth. ) 90 tablet 3  . rOPINIRole (REQUIP) 2 MG tablet Take 1 tablet (2 mg total) by mouth at bedtime. 30 tablet 5  . telmisartan-hydrochlorothiazide (MICARDIS HCT) 80-25 MG tablet Take 1 tablet by mouth daily. 90 tablet 1  . Thyroid 97.5 MG TABS Take 1 tablet (97.5 mg total) by mouth daily. 90 tablet 3   No facility-administered medications prior to visit.     PAST MEDICAL  HISTORY: Past Medical History:  Diagnosis Date  . Anxiety 01/12/2013  . Asthma   . Chicken pox   . Chronic insomnia 11/24/2016  . Clostridium difficile infection 04/02/2014, 02/06/2014  . Depression   . Dyslipidemia   . HTN (hypertension)   . Hypothyroidism   . Migraine   . Multiple allergies   . Obesity   . RLS (restless legs syndrome)     PAST SURGICAL HISTORY: Past Surgical History:  Procedure Laterality Date  . BREAST BIOPSY  6270,3500   x 2 benign lesion  . CERVICAL DISCECTOMY  2003  . CERVICAL FUSION  2010   x2  . Hand fracture surgery    . LUMBAR FUSION  2016  . VAGINAL HYSTERECTOMY  2009    FAMILY HISTORY: Family History  Problem Relation Age of Onset  . Hypertension Father   . Diabetes Father   . Heart disease Father   . Hearing loss Father   . Diabetes Brother   . Arthritis Mother   . Liver cancer Mother   . COPD Mother   . Mental illness Mother   . Diabetes Mother   . Hearing loss Mother   . Heart disease Mother     SOCIAL HISTORY: Social History   Socioeconomic History  . Marital status: Married    Spouse name: [name]  . Number of children: 2  . Years of education: COLLEGE  . Highest education level: Not on file  Occupational History  . Occupation: OWNER    Employer: [employer]  Social Needs  . Financial resource strain: Not on file  . Food insecurity:    Worry: Not on file    Inability: Not on file  . Transportation needs:    Medical: Not on file    Non-medical: Not on file  Tobacco Use  . Smoking status: Never Smoker  . Smokeless tobacco: Never Used  Substance and Sexual Activity  . Alcohol use: No    Alcohol/week: 0.0 standard drinks  . Drug use: No  . Sexual activity: Yes    Partners: Male    Birth control/protection: None  Lifestyle  . Physical activity:    Days per week: Not on file    Minutes per session: Not on file  . Stress: Not on file  Relationships  . Social connections:    Talks on phone: Not on file     Gets together: Not on file    Attends religious service: Not on file    Active member of club or organization: Not on file    Attends meetings of clubs or organizations: Not on file    Relationship status: Not on file  . Intimate partner violence:    Fear of current or ex partner: Not on file    Emotionally  abused: Not on file    Physically abused: Not on file    Forced sexual activity: Not on file  Other Topics Concern  . Not on file  Social History Narrative   Patient is married [name])  2 children [name] and Dime Box)   Patient is right handed.   Patient has a college education. 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    Patient drinks 1 cups daily. Uses herbal remedies, takes a daily vitamin.   Wears her seatbelt, smoke detector in the home.      PHYSICAL EXAM  Vitals:   11/24/17 0714  BP: (!) 141/89  Pulse: 90  Weight: 258 lb 6.4 oz (117.2 kg)  Height: 5\' 4"  (1.626 m)   Body mass index is 44.35 kg/m.  Generalized: Well developed, in no acute distress   Neurological examination  Mentation: Alert oriented to time, place, history taking. Follows all commands speech and language fluent Cranial nerve II-XII: Extraocular movements were full, visual field were full on confrontational test. Facial sensation and strength were normal. Uvula tongue midline. Head turning and shoulder shrug  were normal and symmetric. Motor: The motor testing reveals 5 over 5 strength of all 4 extremities. Good symmetric motor tone is noted throughout.  Sensory: Sensory testing is intact to soft touch on all 4 extremities. No evidence of extinction is noted.  Coordination: Cerebellar testing reveals good finger-nose-finger and heel-to-shin bilaterally.  Gait and station: Gait is normal. Tandem gait is normal. Romberg is negative. No drift is seen.  Reflexes: Deep tendon reflexes are symmetric and normal bilaterally.   DIAGNOSTIC DATA (LABS, IMAGING, TESTING) - I reviewed patient records, labs, notes, testing  and imaging myself where available.  Lab Results  Component Value Date   WBC 8.9 01/06/2017   HGB 14.4 01/06/2017   HCT 43.0 01/06/2017   MCV 92.6 01/06/2017   PLT 330.0 01/06/2017      Component Value Date/Time   NA 140 01/06/2017 1412   NA 141 04/03/2015   K 3.7 01/06/2017 1412   CL 102 01/06/2017 1412   CO2 30 01/06/2017 1412   GLUCOSE 92 01/06/2017 1412   BUN 17 01/06/2017 1412   BUN 20 04/03/2015   CREATININE 0.62 01/06/2017 1412   CALCIUM 9.8 01/06/2017 1412   PROT 6.8 01/06/2017 1412   ALBUMIN 4.1 01/06/2017 1412   AST 15 01/06/2017 1412   ALT 25 01/06/2017 1412   ALKPHOS 75 01/06/2017 1412   BILITOT 0.3 01/06/2017 1412   GFRNONAA >60 11/11/2010 0956   GFRAA >60 11/11/2010 0956   Lab Results  Component Value Date   CHOL 164 04/03/2015   HDL 51 04/03/2015   LDLCALC 91 04/03/2015   TRIG 112 04/03/2015   CHOLHDL 4.1 02/09/2009   Lab Results  Component Value Date   HGBA1C 6.2 10/08/2017    Lab Results  Component Value Date   TSH 6.03 (H) 10/08/2017      ASSESSMENT AND PLAN 52 y.o. year old female  has a past medical history of Anxiety (01/12/2013), Asthma, Chicken pox, Chronic insomnia (11/24/2016), Clostridium difficile infection (04/02/2014, 02/06/2014), Depression, Dyslipidemia, HTN (hypertension), Hypothyroidism, Migraine, Multiple allergies, Obesity, and RLS (restless legs syndrome). here with :  1.  Restless leg syndrome  The patient will continue taking Horizant 1 tablet in the morning and 1 tablet at night.  She will continue on Requip extended release 4 mg in the morning.  I advised that she can take Requip immediate release 2 mg half a tablet at lunch  and a half a tablet before bedtime.  If she is unable to tolerate this or does not find it beneficial she should let us know.  In the future we may retry Mirapex.  She will follow-up in 6 months or sooner if needed.  I spent 15 minutes with the patient. 50% of this time was spent reviewing her  medication   Ward Givens, MSN, NP-C 11/24/2017, 7:36 AM [facility] [address], [place], [place] [address] [phone]

## 2017-11-24 NOTE — Patient Instructions (Signed)
Your Plan:  Continue Horizant Take Requip ER 4 mg in the morning Take Requip 2 mg 1/2 tablet at lunch and then 1/2 tablet at bedtime If this is not beneficial please let us know If your symptoms worsen or you develop new symptoms please let us know.   Thank you for coming to see [place] at [facility]. I hope we have been able to provide you high quality care today.  You may receive a patient satisfaction survey over the next few weeks. We would appreciate your feedback and comments so that we may continue to improve ourselves and the health of our patients.

## 2017-12-13 ENCOUNTER — Other Ambulatory Visit: Payer: Self-pay | Admitting: Adult Health

## 2017-12-14 ENCOUNTER — Ambulatory Visit (INDEPENDENT_AMBULATORY_CARE_PROVIDER_SITE_OTHER): Payer: BLUE CROSS/BLUE SHIELD | Admitting: Family Medicine

## 2017-12-14 ENCOUNTER — Encounter: Payer: Self-pay | Admitting: Family Medicine

## 2017-12-14 ENCOUNTER — Ambulatory Visit
Admission: RE | Admit: 2017-12-14 | Discharge: 2017-12-14 | Disposition: A | Discharge status: Home / Self Care | Payer: BLUE CROSS/BLUE SHIELD | Source: Ambulatory Visit | Attending: Family Medicine | Admitting: Family Medicine

## 2017-12-14 VITALS — BP 134/84 | HR 88 | Temp 98.5°F | Resp 16 | Ht 64.0 in | Wt 254.2 lb

## 2017-12-14 DIAGNOSIS — Z981 Arthrodesis status: Secondary | ICD-10-CM

## 2017-12-14 DIAGNOSIS — M545 Low back pain, unspecified: Secondary | ICD-10-CM

## 2017-12-14 DIAGNOSIS — Z9889 Other specified postprocedural states: Secondary | ICD-10-CM | POA: Diagnosis not present

## 2017-12-14 DIAGNOSIS — M48061 Spinal stenosis, lumbar region without neurogenic claudication: Secondary | ICD-10-CM | POA: Diagnosis not present

## 2017-12-14 NOTE — Patient Instructions (Signed)
Low Back Sprain Rehab  Ask your health care provider which exercises are safe for you. Do exercises exactly as told by your health care provider and adjust them as directed. It is normal to feel mild stretching, pulling, tightness, or discomfort as you do these exercises, but you should stop right away if you feel sudden pain or your pain gets worse. Do not begin these exercises until told by your health care provider.  Stretching and range of motion exercises  These exercises warm up your muscles and joints and improve the movement and flexibility of your back. These exercises also help to relieve pain, numbness, and tingling.  Exercise A: Lumbar rotation    1. Lie on your back on a firm surface and bend your knees.  2. Straighten your arms out to your sides so each arm forms an "L" shape with a side of your body (a 90 degree angle).  3. Slowly move both of your knees to one side of your body until you feel a stretch in your lower back. Try not to let your shoulders move off of the floor.  4. Hold for __________ seconds.  5. Tense your abdominal muscles and slowly move your knees back to the starting position.  6. Repeat this exercise on the other side of your body.  Repeat __________ times. Complete this exercise __________ times a day.  Exercise B: Prone extension on elbows    1. Lie on your abdomen on a firm surface.  2. Prop yourself up on your elbows.  3. Use your arms to help lift your chest up until you feel a gentle stretch in your abdomen and your lower back.  ? This will place some of your body weight on your elbows. If this is uncomfortable, try stacking pillows under your chest.  ? Your hips should stay down, against the surface that you are lying on. Keep your hip and back muscles relaxed.  4. Hold for __________ seconds.  5. Slowly relax your upper body and return to the starting position.  Repeat __________ times. Complete this exercise __________ times a day.  Strengthening exercises  These  exercises build strength and endurance in your back. Endurance is the ability to use your muscles for a long time, even after they get tired.  Exercise C: Pelvic tilt  1. Lie on your back on a firm surface. Bend your knees and keep your feet flat.  2. Tense your abdominal muscles. Tip your pelvis up toward the ceiling and flatten your lower back into the floor.  ? To help with this exercise, you may place a small towel under your lower back and try to push your back into the towel.  3. Hold for __________ seconds.  4. Let your muscles relax completely before you repeat this exercise.  Repeat __________ times. Complete this exercise __________ times a day.  Exercise D: Alternating arm and leg raises    1. Get on your hands and knees on a firm surface. If you are on a hard floor, you may want to use padding to cushion your knees, such as an exercise mat.  2. Line up your arms and legs. Your hands should be below your shoulders, and your knees should be below your hips.  3. Lift your left leg behind you. At the same time, raise your right arm and straighten it in front of you.  ? Do not lift your leg higher than your hip.  ? Do not lift your arm   higher than your shoulder.  ? Keep your abdominal and back muscles tight.  ? Keep your hips facing the ground.  ? Do not arch your back.  ? Keep your balance carefully, and do not hold your breath.  4. Hold for __________ seconds.  5. Slowly return to the starting position and repeat with your right leg and your left arm.  Repeat __________ times. Complete this exercise __________ times a day.  Exercise E: Abdominal set with straight leg raise    1. Lie on your back on a firm surface.  2. Bend one of your knees and keep your other leg straight.  3. Tense your abdominal muscles and lift your straight leg up, 4-6 inches (10-15 cm) off the ground.  4. Keep your abdominal muscles tight and hold for __________ seconds.  ? Do not hold your breath.  ? Do not arch your back. Keep it  flat against the ground.  5. Keep your abdominal muscles tense as you slowly lower your leg back to the starting position.  6. Repeat with your other leg.  Repeat __________ times. Complete this exercise __________ times a day.  Posture and body mechanics    Body mechanics refers to the movements and positions of your body while you do your daily activities. Posture is part of body mechanics. Good posture and healthy body mechanics can help to relieve stress in your body's tissues and joints. Good posture means that your spine is in its natural S-curve position (your spine is neutral), your shoulders are pulled back slightly, and your head is not tipped forward. The following are general guidelines for applying improved posture and body mechanics to your everyday activities.  Standing    · When standing, keep your spine neutral and your feet about hip-width apart. Keep a slight bend in your knees. Your ears, shoulders, and hips should line up.  · When you do a task in which you stand in one place for a long time, place one foot up on a stable object that is 2-4 inches (5-10 cm) high, such as a footstool. This helps keep your spine neutral.  Sitting    · When sitting, keep your spine neutral and keep your feet flat on the floor. Use a footrest, if necessary, and keep your thighs parallel to the floor. Avoid rounding your shoulders, and avoid tilting your head forward.  · When working at a desk or a computer, keep your desk at a height where your hands are slightly lower than your elbows. Slide your chair under your desk so you are close enough to maintain good posture.  · When working at a computer, place your monitor at a height where you are looking straight ahead and you do not have to tilt your head forward or downward to look at the screen.  Resting    · When lying down and resting, avoid positions that are most painful for you.  · If you have pain with activities such as sitting, bending, stooping, or squatting  (flexion-based activities), lie in a position in which your body does not bend very much. For example, avoid curling up on your side with your arms and knees near your chest (fetal position).  · If you have pain with activities such as standing for a long time or reaching with your arms (extension-based activities), lie with your spine in a neutral position and bend your knees slightly. Try the following positions:  · Lying on your side with a   pillow between your knees.  · Lying on your back with a pillow under your knees.  Lifting    · When lifting objects, keep your feet at least shoulder-width apart and tighten your abdominal muscles.  · Bend your knees and hips and keep your spine neutral. It is important to lift using the strength of your legs, not your back. Do not lock your knees straight out.  · Always ask for help to lift heavy or awkward objects.  This information is not intended to replace advice given to you by your health care provider. Make sure you discuss any questions you have with your health care provider.  Document Released: 02/16/2005 Document Revised: 10/24/2015 Document Reviewed: 11/28/2014  Elsevier Interactive Patient Education © 2018 [employer]

## 2017-12-14 NOTE — Progress Notes (Signed)
OFFICE VISIT  12/14/2017   CC:  Chief Complaint  Patient presents with  . Back Pain    right side   HPI:    Patient is a 52 y.o.  female with a history of low back problems and has hx of L2-L3 fusion who presents for right sided back pain. Onset 5 d/a when bending over packing things up from her home, stood back upright and immediately felt severe R lower back pain. Feeling muscle spasms a lot.  Has been constant, goes from 10/10 intensity to 7/10 with regular use of flexeril (1/2 of 10mg  tab tid lately) and tylenol. She had a brief period of radiation of the pain down posterolateral aspect of R leg, paresthesia in same distribution short term.  No leg weakness.  NO loss of b/b control.  No saddle anesthesia. Dr. Ronnald Ramp is her neurosurgeon--most recent surgery was a few yrs ago.  She does not f/u with him any.  Past Medical History:  Diagnosis Date  . Anxiety 01/12/2013  . Asthma   . Chicken pox   . Chronic insomnia 11/24/2016  . Clostridium difficile infection 04/02/2014, 02/06/2014  . Depression   . Dyslipidemia   . HTN (hypertension)   . Hypothyroidism   . Migraine   . Multiple allergies   . Obesity   . RLS (restless legs syndrome)     Past Surgical History:  Procedure Laterality Date  . BREAST BIOPSY  [id number]   x 2 benign lesion  . CERVICAL DISCECTOMY  2003  . CERVICAL FUSION  2010   x2  . Hand fracture surgery    . LUMBAR FUSION  2016  . VAGINAL HYSTERECTOMY  2009    Outpatient Medications Prior to Visit  Medication Sig Dispense Refill  . buPROPion (WELLBUTRIN XL) 300 MG 24 hr tablet Take 1 tablet (300 mg total) by mouth daily. 90 tablet 1  . cholecalciferol (VITAMIN D) 1000 units tablet Take 2,000 Units by mouth daily.     . cyclobenzaprine (FLEXERIL) 10 MG tablet Take 5 mg by mouth 3 (three) times daily as needed for muscle spasms.    . DULoxetine (CYMBALTA) 60 MG capsule Take 1 capsule (60 mg total) by mouth daily. Needs office visit prior to anymore  refills. 90 capsule 0  . estradiol (ESTRACE) 2 MG tablet Take 1 tablet (2 mg total) by mouth daily. 90 tablet 3  . Gabapentin Enacarbil ER (HORIZANT) 300 MG TBCR TAKE 2 TABLETS BY MOUTH EVERY DAY AT 5PM WITH FOOD 60 tablet 11  . liothyronine (CYTOMEL) 5 MCG tablet Take 1 tablet (5 mcg total) by mouth daily. 90 tablet 3  . meloxicam (MOBIC) 15 MG tablet Take 1 tablet (15 mg total) by mouth daily. 90 tablet 1  . progesterone (PROMETRIUM) 100 MG capsule Take 1 capsule (100 mg total) by mouth every other day. 45 capsule 3  . rOPINIRole (REQUIP XL) 4 MG 24 hr tablet Take 1 tablet (4 mg total) by mouth at bedtime. (Patient taking differently: Take 4 mg daily by mouth. ) 90 tablet 3  . rOPINIRole (REQUIP) 2 MG tablet TAKE 1 TABLET BY MOUTH ONCE DAILY AT BEDTIME 30 tablet 5  . telmisartan-hydrochlorothiazide (MICARDIS HCT) 80-25 MG tablet Take 1 tablet by mouth daily. 90 tablet 1  . Thyroid 97.5 MG TABS Take 1 tablet (97.5 mg total) by mouth daily. 90 tablet 3   No facility-administered medications prior to visit.     Allergies  Allergen Reactions  . Bee Venom Swelling and  Anaphylaxis  . Biaxin [Clarithromycin] Diarrhea and Rash  . Gabapentin     Nausea  . Abilify [Aripiprazole] Other (See Comments)    MOOD SWING  . Buspar [Buspirone] Diarrhea  . Lisinopril Cough and Other (See Comments)    Cough  . Maxzide [Triamterene-Hctz] Rash  . Phentermine Other (See Comments)    MOOD CHANGE   . Seroquel [Quetiapine Fumarate] Other (See Comments)    MOOD SWINGS    ROS As per HPI  PE: Blood pressure 134/84, pulse 88, temperature 98.5 F (36.9 C), temperature source Oral, resp. rate 16, height 5\' 4"  (1.626 m), weight 254 lb 4 oz (115.3 kg), SpO2 98 %. Body mass index is 43.64 kg/m.  Gen: Alert, well appearing.  Patient is oriented to person, place, time, and situation. AFFECT: pleasant, lucid thought and speech. L spine ROM: decreased lateral bending and rotation both ways but flexion and  extension are fair and w/out signif pain. She has palpable tenderness in the lateral soft tissues of mid L spine on the right side. No midline tenderness or facet joint tenderness.  No SI joint or hip tenderness.  Sitting SLR neg bilat. LE strength 5/5 prox/dist bilat.  Patellar DTRs 1 + bilat, no achilles DTR on either side.  LABS:    Chemistry      Component Value Date/Time   NA 140 01/06/2017 1412   NA 141 04/03/2015   K 3.7 01/06/2017 1412   CL 102 01/06/2017 1412   CO2 30 01/06/2017 1412   BUN 17 01/06/2017 1412   BUN 20 04/03/2015   CREATININE 0.62 01/06/2017 1412   GLU 168 12/04/2016      Component Value Date/Time   CALCIUM 9.8 01/06/2017 1412   ALKPHOS 75 01/06/2017 1412   AST 15 01/06/2017 1412   ALT 25 01/06/2017 1412   BILITOT 0.3 01/06/2017 1412      IMPRESSION AND PLAN:  Acute musculoskeletal low back strain. Given her hx of back fusion, will check plain films of L/S spine. Refer to PT---she is open to this and wants to "fix the problem" rather than just cover it up. She declines rx pain med.  She has meloxicam 15mg  which she'll continue to take qd, tylenol 1000 mg tid prn. Also, she has flexeril tabs at home already and she'll continue to take 1/2 of a tab tid prn. I printed out low back strain rehab exercises for her today.  An After Visit Summary was printed and given to the patient.  FOLLOW UP: Return in about 6 weeks (around 01/25/2018) for with PCP, Dr. Nelda Severe low back pain/strain.  Signed:  Crissie Sickles, MD           12/14/2017

## 2017-12-15 ENCOUNTER — Encounter: Payer: Self-pay | Admitting: *Deleted

## 2017-12-16 DIAGNOSIS — Z9889 Other specified postprocedural states: Secondary | ICD-10-CM | POA: Diagnosis not present

## 2017-12-16 DIAGNOSIS — M545 Low back pain: Secondary | ICD-10-CM | POA: Diagnosis not present

## 2017-12-16 DIAGNOSIS — Z981 Arthrodesis status: Secondary | ICD-10-CM | POA: Diagnosis not present

## 2017-12-17 ENCOUNTER — Other Ambulatory Visit: Payer: Self-pay | Admitting: Neurology

## 2017-12-21 ENCOUNTER — Other Ambulatory Visit: Payer: Self-pay | Admitting: Neurology

## 2017-12-21 DIAGNOSIS — Z981 Arthrodesis status: Secondary | ICD-10-CM | POA: Diagnosis not present

## 2017-12-21 DIAGNOSIS — M545 Low back pain: Secondary | ICD-10-CM | POA: Diagnosis not present

## 2017-12-21 DIAGNOSIS — Z9889 Other specified postprocedural states: Secondary | ICD-10-CM | POA: Diagnosis not present

## 2017-12-22 DIAGNOSIS — M545 Low back pain: Secondary | ICD-10-CM | POA: Diagnosis not present

## 2017-12-22 DIAGNOSIS — Z9889 Other specified postprocedural states: Secondary | ICD-10-CM | POA: Diagnosis not present

## 2017-12-22 DIAGNOSIS — Z981 Arthrodesis status: Secondary | ICD-10-CM | POA: Diagnosis not present

## 2017-12-27 DIAGNOSIS — Z981 Arthrodesis status: Secondary | ICD-10-CM | POA: Diagnosis not present

## 2017-12-27 DIAGNOSIS — M545 Low back pain: Secondary | ICD-10-CM | POA: Diagnosis not present

## 2017-12-27 DIAGNOSIS — Z9889 Other specified postprocedural states: Secondary | ICD-10-CM | POA: Diagnosis not present

## 2017-12-30 DIAGNOSIS — M545 Low back pain: Secondary | ICD-10-CM | POA: Diagnosis not present

## 2017-12-30 DIAGNOSIS — Z9889 Other specified postprocedural states: Secondary | ICD-10-CM | POA: Diagnosis not present

## 2017-12-30 DIAGNOSIS — Z981 Arthrodesis status: Secondary | ICD-10-CM | POA: Diagnosis not present

## 2018-01-04 DIAGNOSIS — M545 Low back pain: Secondary | ICD-10-CM | POA: Diagnosis not present

## 2018-01-04 DIAGNOSIS — Z981 Arthrodesis status: Secondary | ICD-10-CM | POA: Diagnosis not present

## 2018-01-04 DIAGNOSIS — Z9889 Other specified postprocedural states: Secondary | ICD-10-CM | POA: Diagnosis not present

## 2018-01-07 DIAGNOSIS — Z9889 Other specified postprocedural states: Secondary | ICD-10-CM | POA: Diagnosis not present

## 2018-01-07 DIAGNOSIS — Z981 Arthrodesis status: Secondary | ICD-10-CM | POA: Diagnosis not present

## 2018-01-07 DIAGNOSIS — M545 Low back pain: Secondary | ICD-10-CM | POA: Diagnosis not present

## 2018-01-13 ENCOUNTER — Other Ambulatory Visit: Payer: Self-pay | Admitting: *Deleted

## 2018-01-13 DIAGNOSIS — F339 Major depressive disorder, recurrent, unspecified: Secondary | ICD-10-CM

## 2018-01-13 MED ORDER — DULOXETINE HCL 60 MG PO CPEP: 60.0000 mg | ORAL_CAPSULE | Freq: Every day | ORAL | 0 refills | Status: DC

## 2018-01-25 ENCOUNTER — Ambulatory Visit (INDEPENDENT_AMBULATORY_CARE_PROVIDER_SITE_OTHER): Payer: BLUE CROSS/BLUE SHIELD | Admitting: Family Medicine

## 2018-01-25 ENCOUNTER — Encounter: Payer: Self-pay | Admitting: Family Medicine

## 2018-01-25 VITALS — BP 132/83 | HR 90 | Resp 16 | Ht 64.0 in | Wt 256.0 lb

## 2018-01-25 DIAGNOSIS — Z23 Encounter for immunization: Secondary | ICD-10-CM

## 2018-01-25 DIAGNOSIS — E039 Hypothyroidism, unspecified: Secondary | ICD-10-CM

## 2018-01-25 DIAGNOSIS — R5383 Other fatigue: Secondary | ICD-10-CM

## 2018-01-25 DIAGNOSIS — M2550 Pain in unspecified joint: Secondary | ICD-10-CM | POA: Diagnosis not present

## 2018-01-25 DIAGNOSIS — I10 Essential (primary) hypertension: Secondary | ICD-10-CM | POA: Diagnosis not present

## 2018-01-25 DIAGNOSIS — F339 Major depressive disorder, recurrent, unspecified: Secondary | ICD-10-CM

## 2018-01-25 MED ORDER — MELOXICAM 15 MG PO TABS: 15.0000 mg | ORAL_TABLET | Freq: Every day | ORAL | 1 refills | Status: DC

## 2018-01-25 MED ORDER — BUPROPION HCL ER (XL) 300 MG PO TB24: 300.0000 mg | ORAL_TABLET | Freq: Every day | ORAL | 1 refills | Status: DC

## 2018-01-25 MED ORDER — TELMISARTAN-HCTZ 80-25 MG PO TABS: 1.0000 | ORAL_TABLET | Freq: Every day | ORAL | 1 refills | Status: DC

## 2018-01-25 MED ORDER — DULOXETINE HCL 60 MG PO CPEP: 60.0000 mg | ORAL_CAPSULE | Freq: Every day | ORAL | 0 refills | Status: DC

## 2018-01-25 NOTE — Progress Notes (Signed)
[name] [name] , 1965-08-20, 52 y.o., female MRN: [id number] Patient Care Team    Relationship Specialty Notifications Start End  [name], DO PCP - General Family Medicine  07/07/16   Ob/Gyn, [name]    07/08/16   Wilford Corner, MD Consulting Physician Gastroenterology  07/08/16   [name], MD Consulting Physician Neurology  07/08/16   [name], MD Attending Physician Endocrinology  07/08/16    Comment: Pt established with Si Raider, NP at this location. - [facility]- thyroid    Chief Complaint  Patient presents with  . Back Pain    follow up - thyroid medication follow up     Subjective: [name] [name] is a 52 y.o. present for followup on [facility].  Recurrent major depressive disorder, remission status unspecified (HCC) Reports compliance with Wellbutrin 300 mg daily and Cymbalta 60 mg daily.  She is tolerating these medications well without any side effects.  She is soing well.   Arthralgia, unspecified joint Tolerating meloxicam.  Working well for her. Acute back pain is much improved.   Hypertension/Morbid obesity/elevated a1c: Pt reports complaince with micardis 80-25 milligrams. Patient denies chest pain, shortness of breath, dizziness or lower extremity edema.  ASA. Pt is not prescribed statin. BMP:  01/06/2017 within normal limits CBC: 01/06/2017 within normal limits Lipids: 04/03/2015 within normal limits Diet/exercise does not routinely watch diet and exercise RF: Hypertension, obesity,fhx heart disease  Hypothyroidism/Vit D def/HRT:  Last TSH 6.03 with a normal T4, dose was increased to 97.5 mg of nature thyroid last visit and needs rechcked today. She also was started on low dose cytomel to see if helped with her energy. Patient reports compliance with medication on an empty stomach daily.  She is back to taking the progesteron and estrogen.  Prior note:  Pt presents for an OV with complaints of increased hair loss since stopping the  progesterone.  Progesterone was stopped secondary to she has had a hysterectomy and she has gained weight.  Last TSH was 1 year ago and normal.  She reports compliance with [place] thyroid 90 mg.  She states she was forced to get [place]our Thyroid because nature thyroid is too difficult to obtain.  She was tried on levothyroxine in the past, and reports she was taken off that medication but she does not remember why.  She is wondering if she can try different thyroid medicine.  Depression screen [facility] 2/9 10/08/2017 05/03/2017 01/06/2017 07/07/2016  Decreased Interest 0 0 0 0  Down, Depressed, Hopeless 0 0 0 0  PHQ - 2 Score 0 0 0 0  Altered sleeping 3 - 3 -  Tired, decreased energy 3 - 0 -  Change in appetite 1 - 0 -  Feeling bad or failure about yourself  0 - 0 -  Trouble concentrating - - 0 -  Moving slowly or fidgety/restless 0 - 0 -  Suicidal thoughts 0 - 0 -  PHQ-9 Score 7 - 3 -  Difficult doing work/chores Somewhat difficult - Not difficult at all -    Allergies  Allergen Reactions  . Bee Venom Swelling and Anaphylaxis  . Biaxin [Clarithromycin] Diarrhea and Rash  . Gabapentin     Nausea  . Abilify [Aripiprazole] Other (See Comments)    MOOD SWING  . Buspar [Buspirone] Diarrhea  . Lisinopril Cough and Other (See Comments)    Cough  . Maxzide [Triamterene-Hctz] Rash  . Phentermine Other (See Comments)    MOOD CHANGE   .  Seroquel [Quetiapine Fumarate] Other (See Comments)    MOOD SWINGS   Social History   Tobacco Use  . Smoking status: Never Smoker  . Smokeless tobacco: Never Used  Substance Use Topics  . Alcohol use: No    Alcohol/week: 0.0 standard drinks   Past Medical History:  Diagnosis Date  . Anxiety 01/12/2013  . Asthma   . Chicken pox   . Chronic insomnia 11/24/2016  . Clostridium difficile infection 04/02/2014, 02/06/2014  . Depression   . Dyslipidemia   . HTN (hypertension)   . Hypothyroidism   . Migraine   . Multiple allergies   . Obesity   . RLS (restless  legs syndrome)    Past Surgical History:  Procedure Laterality Date  . BREAST BIOPSY  [id number]   x 2 benign lesion  . CERVICAL DISCECTOMY  2003  . CERVICAL FUSION  2010   x2  . Hand fracture surgery    . LUMBAR FUSION  2016  . VAGINAL HYSTERECTOMY  2009   Family History  Problem Relation Age of Onset  . Hypertension Father   . Diabetes Father   . Heart disease Father   . Hearing loss Father   . Diabetes Brother   . Arthritis Mother   . Liver cancer Mother   . COPD Mother   . Mental illness Mother   . Diabetes Mother   . Hearing loss Mother   . Heart disease Mother    Allergies as of 01/25/2018      Reactions   Bee Venom Swelling, Anaphylaxis   Biaxin [clarithromycin] Diarrhea, Rash   Gabapentin    Nausea   Abilify [aripiprazole] Other (See Comments)   MOOD SWING   Buspar [buspirone] Diarrhea   Lisinopril Cough, Other (See Comments)   Cough   Maxzide [triamterene-hctz] Rash   Phentermine Other (See Comments)   MOOD CHANGE   Seroquel [quetiapine Fumarate] Other (See Comments)   MOOD SWINGS      Medication List        Accurate as of 01/25/18  2:40 PM. Always use your most recent med list.          buPROPion 300 MG 24 hr tablet Commonly known as:  WELLBUTRIN XL Take 1 tablet (300 mg total) by mouth daily.   cholecalciferol 1000 units tablet Commonly known as:  VITAMIN D Take 2,000 Units by mouth daily.   cyclobenzaprine 10 MG tablet Commonly known as:  FLEXERIL Take 5 mg by mouth 3 (three) times daily as needed for muscle spasms.   DULoxetine 60 MG capsule Commonly known as:  CYMBALTA Take 1 capsule (60 mg total) by mouth daily. Needs office visit prior to anymore refills.   estradiol 2 MG tablet Commonly known as:  ESTRACE Take 1 tablet (2 mg total) by mouth daily.   HORIZANT 300 MG Tbcr Generic drug:  Gabapentin Enacarbil ER TAKE 2 TABLETS BY MOUTH EVERY DAY AT 5PM WITH FOOD   liothyronine 5 MCG tablet Commonly known as:  CYTOMEL Take 1  tablet (5 mcg total) by mouth daily.   meloxicam 15 MG tablet Commonly known as:  MOBIC Take 1 tablet (15 mg total) by mouth daily.   progesterone 100 MG capsule Commonly known as:  PROMETRIUM Take 1 capsule (100 mg total) by mouth every other day.   rOPINIRole 2 MG tablet Commonly known as:  REQUIP TAKE 1 TABLET BY MOUTH ONCE DAILY AT BEDTIME   rOPINIRole 4 MG 24 hr tablet Commonly known as:  REQUIP  XL Take 1 tablet (4 mg total) by mouth daily.   telmisartan-hydrochlorothiazide 80-25 MG tablet Commonly known as:  MICARDIS HCT Take 1 tablet by mouth daily.   Thyroid 97.5 MG Tabs Take 1 tablet (97.5 mg total) by mouth daily.       All past medical history, surgical history, allergies, family history, immunizations andmedications were updated in the EMR today and reviewed under the history and medication portions of their EMR.     ROS: Negative, with the exception of above mentioned in HPI   Objective:  BP 132/83 (BP Location: Left Arm, Patient Position: Sitting, Cuff Size: Large)   Pulse 90   Resp 16   Ht 5\' 4"  (1.626 m)   Wt 256 lb (116.1 kg)   SpO2 98%   BMI 43.94 kg/m  Body mass index is 43.94 kg/m. Gen: Afebrile. No acute distress. Nontoxic in appearance, obese female.  HENT: AT. [place]. Bilateral TM visualized and normal in appearance. MMM.  Eyes:Pupils Equal Round Reactive to light, Extraocular movements intact,  Conjunctiva without redness, discharge or icterus. Neck/lymp/endocrine: Supple,no lymphadenopathy, no thyromegaly CV: RRR no murmur, no edema, +2/4 P posterior tibialis pulses Chest: CTAB, no wheeze or crackles MSK: full ROM, no erythema.  Skin: no rashes, purpura or petechiae.  Neuro:  Normal gait. PERLA. EOMi. Alert. Oriented. Psych: Normal affect, dress and demeanor. Normal speech. Normal thought content and judgment.   No exam data present No results found. No results found for this or any previous visit (from the past 24  hour(s)).  Assessment/Plan: KAMESHA HERNE is a 52 y.o. female present for OV for  hypothyroidism -Reports compliance with 97.5 mg of nature thyroid daily and cytomel 5 mcg.  - TSH - T4, free - dose will be adjusted if needed. Still fatigued.  Recurrent major depressive disorder, remission status unspecified (HCC) Stable. Continue Cymbalta 60 mg daily and Wellbutrin 300 mg daily, refilled. - DULoxetine (CYMBALTA) 60 MG capsule; Take 1 capsule (60 mg total) by mouth daily. Needs office visit prior to anymore refills.  Dispense: 30 capsule; Refill: 0 - buPROPion (WELLBUTRIN XL) 300 MG 24 hr tablet; Take 1 tablet (300 mg total) by mouth daily.  Dispense: 90 tablet; Refill: 1 Hormone replacement therapy (HRT) continue estrogen and restart progesterone 100 mg every other day.  Some improvement with the add back of progesterone.  Arthralgia, unspecified joint Continue mobic . - meloxicam (MOBIC) 15 MG tablet; Take 1 tablet (15 mg total) by mouth daily.  Dispense: 90 tablet; Refill: 1 Fatigue, unspecified type - B12 - vitd --> now taking 2000 u daily.  Essential hypertension/morbid obesity Stable.  Low sodium.  NEEDS LABS NEXT APPT.  - telmisartan-hydrochlorothiazide (MICARDIS HCT) 80-25 MG tablet; Take 1 tablet by mouth daily.  Dispense: 90 tablet; Refill: 1  Need for immunization against influenza - Flu Vaccine QUAD 36+ mos IM     Reviewed expectations re: course of current medical issues.  Discussed self-management of symptoms.  Outlined signs and symptoms indicating need for more acute intervention.  Patient verbalized understanding and all questions were answered.  Patient received an After-Visit Summary.    No orders of the defined types were placed in this encounter.    Note is dictated utilizing voice recognition software. Although note has been proof read prior to signing, occasional typographical errors still can be missed. If any questions arise, please do not  hesitate to call for verification.   electronically signed by:  Howard Pouch, DO  [facility] -  OR

## 2018-01-25 NOTE — Patient Instructions (Addendum)
We will call you with your results.  I have refilled your meds.   F/u 6 months.   Hope you have happy holidays!!  Please help [place] help you:  We are honored you have chosen [facility] for your [facility] home. Below you will find basic instructions that you may need to access in the future. Please help [place] help you by reading the instructions, which cover many of the frequent questions we experience.   Prescription refills and request:  -In order to allow more efficient response time, please call your pharmacy for all refills. They will forward the request electronically to [place]. This allows for the quickest possible response. Request left on a nurse line can take longer to refill, since these are checked as time allows between office patients and other phone calls.  - refill request can take up to 3-5 working days to complete.  - If request is sent electronically and request is appropiate, it is usually completed in 1-2 business days.  - all patients will need to be seen routinely for all chronic medical conditions requiring prescription medications (see follow-up below). If you are overdue for follow up on your condition, you will be asked to make an appointment and we will call in enough medication to cover you until your appointment (up to 30 days).  - all controlled substances will require a face to face visit to request/refill.  - if you desire your prescriptions to go through a new pharmacy, and have an active script at original pharmacy, you will need to call your pharmacy and have scripts transferred to new pharmacy. This is completed between the pharmacy locations and not by your provider.    Results: If any images or labs were ordered, it can take up to 1 week to get results depending on the test ordered and the lab/facility running and resulting the test. - Normal or stable results, which do not need further discussion, may be released to your mychart immediately with attached  note to you. A call may not be generated for normal results. Please make certain to sign up for mychart. If you have questions on how to activate your mychart you can call the front office.  - If your results need further discussion, our office will attempt to contact you via phone, and if unable to reach you after 2 attempts, we will release your abnormal result to your mychart with instructions.  - All results will be automatically released in mychart after 1 week.  - Your provider will provide you with explanation and instruction on all relevant material in your results. Please keep in mind, results and labs may appear confusing or abnormal to the untrained eye, but it does not mean they are actually abnormal for you personally. If you have any questions about your results that are not covered, or you desire more detailed explanation than what was provided, you should make an appointment with your provider to do so.   Our office handles many outgoing and incoming calls daily. If we have not contacted you within 1 week about your results, please check your mychart to see if there is a message first and if not, then contact our office.  In helping with this matter, you help decrease call volume, and therefore allow [place] to be able to respond to patients needs more efficiently.   Acute office visits (sick visit):  An acute visit is intended for a new problem and are scheduled in shorter time  slots to allow schedule openings for patients with new problems. This is the appropriate visit to discuss a new problem. Problems will not be addressed by phone call or Echart message. Appointment is needed if requesting treatment. In order to provide you with excellent quality medical care with proper time for you to explain your problem, have an exam and receive treatment with instructions, these appointments should be limited to one new problem per visit. If you experience a new problem, in which you desire to be  addressed, please make an acute office visit, we save openings on the schedule to accommodate you. Please do not save your new problem for any other type of visit, let us take care of it properly and quickly for you.   Follow up visits:  Depending on your condition(s) your provider will need to see you routinely in order to provide you with quality care and prescribe medication(s). Most chronic conditions (Example: hypertension, Diabetes, depression/anxiety... etc), require visits a couple times a year. Your provider will instruct you on proper follow up for your personal medical conditions and history. Please make certain to make follow up appointments for your condition as instructed. Failing to do so could result in lapse in your medication treatment/refills. If you request a refill, and are overdue to be seen on a condition, we will always provide you with a 30 day script (once) to allow you time to schedule.    Medicare wellness (well visit): - we have a wonderful Nurse Maudie Mercury), that will meet with you and provide you will yearly medicare wellness visits. These visits should occur yearly (can not be scheduled less than 1 calendar year apart) and cover preventive health, immunizations, advance directives and screenings you are entitled to yearly through your medicare benefits. Do not miss out on your entitled benefits, this is when medicare will pay for these benefits to be ordered for you.  These are strongly encouraged by your provider and is the appropriate type of visit to make certain you are up to date with all preventive health benefits. If you have not had your medicare wellness exam in the last 12 months, please make certain to schedule one by calling the office and schedule your medicare wellness with Maudie Mercury as soon as possible.   Yearly physical (well visit):  - Adults are recommended to be seen yearly for physicals. Check with your insurance and date of your last physical, most insurances  require one calendar year between physicals. Physicals include all preventive health topics, screenings, medical exam and labs that are appropriate for gender/age and history. You may have fasting labs needed at this visit. This is a well visit (not a sick visit), new problems should not be covered during this visit (see acute visit).  - Pediatric patients are seen more frequently when they are younger. Your provider will advise you on well child visit timing that is appropriate for your their age. - This is not a medicare wellness visit. Medicare wellness exams do not have an exam portion to the visit. Some medicare companies allow for a physical, some do not allow a yearly physical. If your medicare allows a yearly physical you can schedule the medicare wellness with our nurse Maudie Mercury and have your physical with your provider after, on the same day. Please check with insurance for your full benefits.   Late Policy/No Shows:  - all new patients should arrive 15-30 minutes earlier than appointment to allow [place] time  to  obtain all personal  demographics,  insurance information and for you to complete office paperwork. - All established patients should arrive 10-15 minutes earlier than appointment time to update all information and be checked in .  - In our best efforts to run on time, if you are late for your appointment you will be asked to either reschedule or if able, we will work you back into the schedule. There will be a wait time to work you back in the schedule,  depending on availability.  - If you are unable to make it to your appointment as scheduled, please call 24 hours ahead of time to allow [place] to fill the time slot with someone else who needs to be seen. If you do not cancel your appointment ahead of time, you may be charged a no show fee.

## 2018-01-26 LAB — T4, FREE: Free T4: 1 ng/dL (ref 0.8–1.8)

## 2018-01-26 LAB — VITAMIN B12: Vitamin B-12: 315 pg/mL (ref 200–1100)

## 2018-01-26 LAB — TSH: TSH: 5.8 mIU/L — ABNORMAL HIGH

## 2018-01-31 ENCOUNTER — Telehealth: Payer: Self-pay | Admitting: Family Medicine

## 2018-01-31 ENCOUNTER — Encounter: Payer: Self-pay | Admitting: Family Medicine

## 2018-01-31 DIAGNOSIS — E538 Deficiency of other specified B group vitamins: Secondary | ICD-10-CM

## 2018-01-31 DIAGNOSIS — E039 Hypothyroidism, unspecified: Secondary | ICD-10-CM

## 2018-01-31 HISTORY — DX: Deficiency of other specified B group vitamins: E53.8

## 2018-01-31 MED ORDER — THYROID 97.5 MG PO TABS: ORAL_TABLET | ORAL | 3 refills | Status: DC

## 2018-01-31 NOTE — Telephone Encounter (Signed)
Patient notified and verbalized understanding. Appt scheduled for lab.

## 2018-01-31 NOTE — Telephone Encounter (Addendum)
Please inform patient the following information: Her b12 is <400 (desired). Start B12 OTC [phone] mcg. This can cause fatigue. Will retest these levels at her next appt in 6 mos.  Her thyroid is still a little under replaced. I want her to stay on same dose for 6 days of the week - but take 1.5 pills one day a week (pick a day and stick to it is the easiest method). Retest TSH by lab appt only on 6- 8 weeks )please schedule her).

## 2018-02-06 DIAGNOSIS — H66002 Acute suppurative otitis media without spontaneous rupture of ear drum, left ear: Secondary | ICD-10-CM | POA: Diagnosis not present

## 2018-02-10 ENCOUNTER — Ambulatory Visit: Payer: BLUE CROSS/BLUE SHIELD | Admitting: Family Medicine

## 2018-02-12 DIAGNOSIS — H66002 Acute suppurative otitis media without spontaneous rupture of ear drum, left ear: Secondary | ICD-10-CM | POA: Diagnosis not present

## 2018-02-17 ENCOUNTER — Encounter: Payer: Self-pay | Admitting: Family Medicine

## 2018-02-17 ENCOUNTER — Ambulatory Visit (INDEPENDENT_AMBULATORY_CARE_PROVIDER_SITE_OTHER): Payer: BLUE CROSS/BLUE SHIELD | Admitting: Family Medicine

## 2018-02-17 VITALS — BP 142/79 | HR 84 | Temp 98.7°F | Resp 16 | Ht 64.0 in | Wt 258.1 lb

## 2018-02-17 DIAGNOSIS — H60502 Unspecified acute noninfective otitis externa, left ear: Secondary | ICD-10-CM

## 2018-02-17 MED ORDER — NEOMYCIN-POLYMYXIN-HC 3.5-10000-1 OT SOLN: 4.0000 [drp] | Freq: Four times a day (QID) | OTIC | 1 refills | Status: DC

## 2018-02-17 NOTE — Progress Notes (Signed)
OFFICE VISIT  02/17/2018   CC:  Chief Complaint  Patient presents with  . Ear Pain    left, seen at [facility] twice   HPI:    Patient is a 52 y.o. Caucasian female who presents for left ear complaint. Onset about 2 weeks ago severe pain in left ear.  No URI, fever, or ear drainage.  The pain felt deep in ear.  The external ear anatomy was not red or swollen.  Question of mild decreased hearing in L ear.  No ringing.  ? Off balance when walking.   Went to [facility] 02/06/18: pred pack, amoxil, allegra rx'd.  Improved for a few days then returned.   Heating pad helps temporarily. She returned to [facility] 02/12/18 and was given cefdinir x 7d and she has a couple days left of this. Since being on cefdinir she has improved about 25%.  Taking tylenol occ. She's never had ear pain like this before.  No ear drops have been prescribed.  Past Medical History:  Diagnosis Date  . Anxiety 01/12/2013  . Asthma   . Chicken pox   . Chronic insomnia 11/24/2016  . Clostridium difficile infection 04/02/2014, 02/06/2014  . Depression   . Dyslipidemia   . HTN (hypertension)   . Hypothyroidism   . Migraine   . Multiple allergies   . Obesity   . RLS (restless legs syndrome)     Past Surgical History:  Procedure Laterality Date  . BREAST BIOPSY  [id number]   x 2 benign lesion  . CERVICAL DISCECTOMY  2003  . CERVICAL FUSION  2010   x2  . Hand fracture surgery    . LUMBAR FUSION  2016  . VAGINAL HYSTERECTOMY  2009    Outpatient Medications Prior to Visit  Medication Sig Dispense Refill  . buPROPion (WELLBUTRIN XL) 300 MG 24 hr tablet Take 1 tablet (300 mg total) by mouth daily. 90 tablet 1  . cholecalciferol (VITAMIN D) 1000 units tablet Take 2,000 Units by mouth daily.     . cyclobenzaprine (FLEXERIL) 10 MG tablet Take 5 mg by mouth 3 (three) times daily as needed for muscle spasms.    . DULoxetine (CYMBALTA) 60 MG capsule Take 1 capsule (60 mg total) by mouth daily. Needs office visit prior to anymore  refills. 90 capsule 0  . estradiol (ESTRACE) 2 MG tablet Take 1 tablet (2 mg total) by mouth daily. 90 tablet 3  . HORIZANT 300 MG TBCR TAKE 2 TABLETS BY MOUTH EVERY DAY AT 5PM WITH FOOD 60 tablet 6  . liothyronine (CYTOMEL) 5 MCG tablet Take 1 tablet (5 mcg total) by mouth daily. 90 tablet 3  . meloxicam (MOBIC) 15 MG tablet Take 1 tablet (15 mg total) by mouth daily. 90 tablet 1  . progesterone (PROMETRIUM) 100 MG capsule Take 1 capsule (100 mg total) by mouth every other day. 45 capsule 3  . rOPINIRole (REQUIP XL) 4 MG 24 hr tablet Take 1 tablet (4 mg total) by mouth daily. 90 tablet 3  . rOPINIRole (REQUIP) 2 MG tablet TAKE 1 TABLET BY MOUTH ONCE DAILY AT BEDTIME 30 tablet 5  . telmisartan-hydrochlorothiazide (MICARDIS HCT) 80-25 MG tablet Take 1 tablet by mouth daily. 90 tablet 1  . Thyroid 97.5 MG TABS 1 tablet daily for 6 days a week and 1.5 tabs x1 day a week. 90 tablet 3   No facility-administered medications prior to visit.     Allergies  Allergen Reactions  . Bee Venom Swelling and Anaphylaxis  .  Biaxin [Clarithromycin] Diarrhea and Rash  . Gabapentin     Nausea  . Abilify [Aripiprazole] Other (See Comments)    MOOD SWING  . Buspar [Buspirone] Diarrhea  . Lisinopril Cough and Other (See Comments)    Cough  . Maxzide [Triamterene-Hctz] Rash  . Phentermine Other (See Comments)    MOOD CHANGE   . Seroquel [Quetiapine Fumarate] Other (See Comments)    MOOD SWINGS    ROS As per HPI  PE: Blood pressure (!) 142/79, pulse 84, temperature 98.7 F (37.1 C), temperature source Oral, resp. rate 16, height 5\' 4"  (1.626 m), weight 258 lb 2 oz (117.1 kg), SpO2 99 %. Gen: Alert, well appearing.  Patient is oriented to person, place, time, and situation. AFFECT: pleasant, lucid thought and speech. R ear, EAC, and TM normal. L external ear anatomy w/out erythema, swelling, or induration.  No TMJ tenderness or subluxation.  I feel no adenopathy about the ear.  She has pain with  pressure exerted over the tragus and with insertion of the ear speculum. There is mild EAC erythema and question of mild swelling of posterior wall of canal.  No debris or exudate.  TM with good light reflex and landmarks, w/out any fluid noted in middle ear.  TM intact.  LABS:    Chemistry      Component Value Date/Time   NA 140 01/06/2017 1412   NA 141 04/03/2015   K 3.7 01/06/2017 1412   CL 102 01/06/2017 1412   CO2 30 01/06/2017 1412   BUN 17 01/06/2017 1412   BUN 20 04/03/2015   CREATININE 0.62 01/06/2017 1412   GLU 168 12/04/2016      Component Value Date/Time   CALCIUM 9.8 01/06/2017 1412   ALKPHOS 75 01/06/2017 1412   AST 15 01/06/2017 1412   ALT 25 01/06/2017 1412   BILITOT 0.3 01/06/2017 1412       IMPRESSION AND PLAN:  Acute left ear pain; no signif improvement with treatment (x 2) for AOM. I see subtle findings to support AOE today. Will treat with cortisporin otic 4 gtts qid x 7d in left ear. She'll continue tylenol 1000mg  q6h prn for pain.   An After Visit Summary was printed and given to the patient.  FOLLOW UP: Return if symptoms worsen or fail to improve.  Signed:  Crissie Sickles, MD           02/17/2018

## 2018-03-17 ENCOUNTER — Telehealth: Payer: Self-pay | Admitting: *Deleted

## 2018-03-17 NOTE — Telephone Encounter (Signed)
Received fax from [facility] pharmacy, re: Requip XL 4 mg needs PA. Started PA on CMM, key: [id number], diagnosis : G25.81. Tried/failed: gabapentin, Neurpo patch, Mirapex. Your information has been submitted to [place]. [employer] [place] will review the request and fax you a determination directly, typically within 3 business days of your submission once all necessary information is received.  If [employer] [place] has not responded in 3 business days or if you have any questions about your submission, contact [place] at [phone].

## 2018-03-21 NOTE — Telephone Encounter (Addendum)
Received fax from [facility], ropinirole 4 mg ER denied. Patient has not failed 2 alternatives. She failed prampiexole.  Other preferred alternative is Ropinirole which patient is now taking.

## 2018-03-22 ENCOUNTER — Encounter: Payer: Self-pay | Admitting: *Deleted

## 2018-03-22 NOTE — Telephone Encounter (Signed)
Called patient to discuss how she is doing on Requip 4mg  and that I am doing an appeal letter. She stated to disregard. She stated [employer] has that medication, and she gets it for $30 a month.  She verbalized appreciation of call.

## 2018-03-23 NOTE — Telephone Encounter (Signed)
Noted  

## 2018-03-29 ENCOUNTER — Other Ambulatory Visit (INDEPENDENT_AMBULATORY_CARE_PROVIDER_SITE_OTHER): Payer: BLUE CROSS/BLUE SHIELD

## 2018-03-29 ENCOUNTER — Telehealth: Payer: Self-pay | Admitting: Family Medicine

## 2018-03-29 ENCOUNTER — Encounter: Payer: Self-pay | Admitting: Family Medicine

## 2018-03-29 DIAGNOSIS — E039 Hypothyroidism, unspecified: Secondary | ICD-10-CM | POA: Diagnosis not present

## 2018-03-29 LAB — TSH: TSH: 6.82 u[IU]/mL — ABNORMAL HIGH (ref 0.35–4.50)

## 2018-03-29 MED ORDER — THYROID 113.75 MG PO TABS: 113.7500 mg | ORAL_TABLET | Freq: Every day | ORAL | 0 refills | Status: DC

## 2018-03-29 NOTE — Telephone Encounter (Signed)
Please inform patient the following information: Despite increasing her thyroid supplement- her thyroid level continues to be under replaced.  - I have increased again to next higher dose- take one a day on empty stomach and follow up with provider in 8 weeks. If levels remain low despite change again- will need to consider endocrine referral.

## 2018-03-30 NOTE — Telephone Encounter (Signed)
Patient advised of results and recommendations.  Patient made appt w/ Dr Raoul Pitch 05/25/2018 @ 10:45am.

## 2018-04-05 ENCOUNTER — Encounter: Payer: Self-pay | Admitting: Family Medicine

## 2018-04-07 ENCOUNTER — Other Ambulatory Visit: Payer: Self-pay | Admitting: Family Medicine

## 2018-04-07 DIAGNOSIS — E039 Hypothyroidism, unspecified: Secondary | ICD-10-CM

## 2018-04-14 ENCOUNTER — Encounter: Payer: Self-pay | Admitting: Internal Medicine

## 2018-04-14 ENCOUNTER — Ambulatory Visit (INDEPENDENT_AMBULATORY_CARE_PROVIDER_SITE_OTHER): Payer: BLUE CROSS/BLUE SHIELD | Admitting: Internal Medicine

## 2018-04-14 VITALS — BP 134/80 | HR 90 | Ht 64.0 in | Wt 259.0 lb

## 2018-04-14 DIAGNOSIS — E039 Hypothyroidism, unspecified: Secondary | ICD-10-CM | POA: Diagnosis not present

## 2018-04-14 MED ORDER — LEVOTHYROXINE SODIUM 150 MCG PO TABS: 150.0000 ug | ORAL_TABLET | Freq: Every day | ORAL | 1 refills | Status: DC

## 2018-04-14 NOTE — Patient Instructions (Addendum)
-   STOP Nature-Thyroid - STOP Liothyronine  - Start Levothyroxine 150 mcg daily   You are on levothyroxine - which is your thyroid hormone supplement. You MUST take this consistently.  You should take this first thing in the morning on an empty stomach with water. You should not take it with other medications. Wait 19min to 1hr prior to eating. If you are taking any vitamins - please take these in the evening.   If you miss a dose, please take your missed dose the following day (double the dose for that day). You should have a pill box for ONLY levothyroxine on your bedside table to help you remember to take your medications.

## 2018-04-14 NOTE — Progress Notes (Signed)
Name: [name] [name]  MRN/ DOB: [id number], [birth year]    Age/ Sex: 53 y.o., female    PCP: [name], DO   Reason for Endocrinology Evaluation: Hypothyroidism     Date of Initial Endocrinology Evaluation: 04/14/2018     HPI: [name] [name] is a 53 y.o. female with a past medical history of HTN, RLS, Hypothyroidism, depression and Dyslipidemia  [name] [name] presented for initial endocrinology clinic visit on 04/14/2018 for consultative assistance with [name] [name] Hypothyroidism   [name] [name] has been on diagnosed with hypothyroidism  >10 yrs. [name] [name] was on Levothyroxine initially but switched to Nature-Throid due to difficult to control TFTs. [name] [name] has been on Nature- throid for ~ 4 yrs and unable to control  [name] [name] TSh. [name] [name] was started on Cytomel 3 months ago. Se did not feel any better on these combo.   [name] [name] states compliance with [name] [name] medication intake.   [name] [name] is c/o hair loss and excessive sweating, insomnia as well as weight gain . [name] [name] has cocasional tingling.   [name] [name] denies any constipation and depression.   Medications [name] [name] is on Nature- throid 113.75 mg daily  Cytomel 5 mcg daily    No family history of thyroid disease or autoimmune disease   HISTORY:  Past Medical History:  Past Medical History:  Diagnosis Date  . Anxiety 01/12/2013  . Asthma   . Chicken pox   . Chronic insomnia 11/24/2016  . Clostridium difficile infection 04/02/2014, 02/06/2014  . Depression   . Dyslipidemia   . HTN (hypertension)   . Hypothyroidism   . Migraine   . Multiple allergies   . Obesity   . RLS (restless legs syndrome)    Past Surgical History:  Past Surgical History:  Procedure Laterality Date  . BREAST BIOPSY  4098,1191   x 2 benign lesion  . CERVICAL DISCECTOMY  2003  . CERVICAL FUSION  2010   x2  . Hand fracture surgery    . LUMBAR FUSION  2016  . VAGINAL HYSTERECTOMY  2009      Social History:  reports that [name] [name] has never smoked. [name] [name] has never used smokeless tobacco. [name] [name] reports that  [name] [name] does not drink alcohol or use drugs.  Family History: family history includes Arthritis in [name] [name] mother; COPD in [name] [name] mother; Diabetes in [name] [name] brother, father, and mother; Hearing loss in [name] [name] father and mother; Heart disease in [name] [name] father and mother; Hypertension in [name] [name] father; Liver cancer in [name] [name] mother; Mental illness in [name] [name] mother.   HOME MEDICATIONS: Allergies as of 04/14/2018      Reactions   Bee Venom Swelling, Anaphylaxis   Biaxin [clarithromycin] Diarrhea, Rash   Gabapentin    Nausea   Abilify [aripiprazole] Other (See Comments)   MOOD SWING   Buspar [buspirone] Diarrhea   Lisinopril Cough, Other (See Comments)   Cough   Maxzide [triamterene-hctz] Rash   Phentermine Other (See Comments)   MOOD CHANGE   Seroquel [quetiapine Fumarate] Other (See Comments)   MOOD SWINGS      Medication List       Accurate as of April 14, 2018  8:35 AM. Always use your most recent med list.        buPROPion 300 MG 24 hr tablet Commonly known as:  WELLBUTRIN XL Take 1 tablet (300 mg total) by mouth daily.   cholecalciferol 1000 units tablet Commonly known as:  VITAMIN D Take 2,000 Units by mouth daily.   DULoxetine 60 MG capsule Commonly known as:  CYMBALTA Take 1 capsule (60 mg total) by mouth daily. Needs office visit prior to anymore refills.   estradiol 2 MG tablet Commonly known as:  ESTRACE Take 1 tablet (2 mg total) by mouth daily.   HORIZANT 300 MG Tbcr Generic drug:  Gabapentin Enacarbil ER TAKE 2 TABLETS BY MOUTH EVERY DAY AT 5PM WITH FOOD   liothyronine 5 MCG tablet Commonly known as:  CYTOMEL Take 1 tablet (5 mcg total) by mouth daily.   meloxicam 15 MG tablet Commonly known as:  MOBIC Take 1 tablet (15 mg total) by mouth daily.   progesterone 100 MG capsule Commonly known as:  PROMETRIUM Take 1 capsule (100 mg total) by mouth every other day.   rOPINIRole 2 MG tablet Commonly known as:  REQUIP TAKE 1 TABLET BY MOUTH ONCE DAILY AT BEDTIME     rOPINIRole 4 MG 24 hr tablet Commonly known as:  REQUIP XL Take 1 tablet (4 mg total) by mouth daily.   telmisartan-hydrochlorothiazide 80-25 MG tablet Commonly known as:  MICARDIS HCT Take 1 tablet by mouth daily.   Thyroid 113.75 MG Tabs Commonly known as:  NATURE-THROID Take 113.75 mg by mouth daily. On an empty stomach         REVIEW OF SYSTEMS: A comprehensive ROS was conducted with [name] [name] and is negative except as per HPI and below:  Review of Systems  Constitutional: Positive for malaise/fatigue. Negative for weight loss.  HENT: Positive for congestion. Negative for sore throat.   Eyes: Negative for blurred vision and pain.  Respiratory: Negative for cough and shortness of breath.   Cardiovascular: Negative for chest pain and palpitations.  Gastrointestinal: Positive for nausea. Negative for constipation.  Genitourinary: Positive for frequency.  Neurological: Positive for tingling. Negative for tremors.  Endo/Heme/Allergies: Positive for polydipsia.  Psychiatric/Behavioral: Negative for depression. [name] [name] is not nervous/anxious.        OBJECTIVE:  VS: BP 134/80   Pulse 90   Ht 5\' 4"  (1.626 m)   Wt 259 lb (117.5 kg)   SpO2 96%   BMI 44.46 kg/m    Wt Readings from Last 3 Encounters:  04/14/18 259 lb (117.5 kg)  02/17/18 258 lb 2 oz (117.1 kg)  01/25/18 256 lb (116.1 kg)     EXAM: General: [name] [name] appears well and is in NAD  Hydration: Well-hydrated with moist mucous membranes and good skin turgor  Eyes: External eye exam normal without stare, lid lag or exophthalmos.  EOM intact.  PERRL.  Ears, Nose, Throat: Hearing: Grossly intact bilaterally Dental: Good dentition  Throat: Clear without mass, erythema or exudate  Neck: General: Supple without adenopathy. Thyroid: Thyroid size normal.  No goiter or nodules appreciated. No thyroid bruit.  Lungs: Clear with good BS bilat with no rales, rhonchi, or wheezes  Heart: Auscultation: RRR.  Abdomen:  Normoactive bowel sounds, soft, nontender, without masses or organomegaly palpable  Extremities:  BL LE: No pretibial edema normal ROM and strength.  Skin: Hair: Texture and amount normal with gender appropriate distribution Skin Inspection: No rashes.  Skin Palpation: Skin temperature, texture, and thickness normal to palpation  Neuro: Cranial nerves: II - XII grossly intact  Motor: Normal strength throughout DTRs: 2+ and symmetric in UE without delay in relaxation phase  Mental Status: Judgment, insight: Intact Orientation: Oriented to time, place, and person Mood and affect: No depression, anxiety, or agitation     DATA REVIEWED:   Results for BROOKSIE, ELLWANGER (MRN [id number]) as of 04/14/2018 07:42  Ref. Range 07/20/2017 13:32 10/08/2017 10:09 01/25/2018 14:52 03/29/2018 14:02  TSH Latest Ref Range: 0.35 - 4.50 uIU/mL 5.93 (H) 6.03 (H) 5.80 (H) 6.82 (H)  T4,Free(Direct) Latest Ref Range: 0.8 - 1.8 ng/dL 0.86 0.82 1.0      Thyroid ultrasound 09/09/2015  Right thyroid lobe  Measurements: 4.8 x 1.8 x 1.3 cm.  No nodules visualized.  Left thyroid lobe  Measurements: 3.8 x 1.3 x 1 cm.  No nodules visualized.  Isthmus  Thickness: 0.4 cm.  No nodules visualized.  Lymphadenopathy  None visualized.  IMPRESSION: Normal  ASSESSMENT/PLAN/RECOMMENDATIONS:   1. Hypothyroidism :  -[name] with multiple nonspecific symptoms that could be attributed to [name] [name] thyroid condition. -  I have advised [name] [name] for my preference to go with Levothyroxine rather then dessicated thyroid, due to more stability with T4 content in levothyroxine and also dessicated thyroid has non-physiologic levels of T4:T3 of  4:1 (physiologic levels 13:1 to 16:1) -[name] agreed to stopping Cytomel and nature thyroid and starting levothyroxine - [name] [name] educated extensively on [name] correct way to take levothyroxine (first thing in [name] morning with water, 30 minutes before eating or taking other medications). -  [name] [name] encouraged to double dose [name] following day if [name] [name] were to miss a dose given long half-life of levothyroxine.   Medications : -Stop Nature-Throid - Stop liothyronine -Start levothyroxine 150 MCG daily   Follow-up in 7 weeks    Signed electronically by: Mack Guise, MD  [facility]  [facility] [address] [place], [place] [address] Phone: [phone] FAX: [phone]   CC: [name], DO [address] [place] [address] Phone: [phone] Fax: [phone]   Return to Endocrinology clinic as below: Future Appointments  Date Time Provider [place]  05/25/2018 10:45 AM [name], DO [facility]  07/26/2018  2:30 PM Kuneff, Renee A, DO [facility]  08/09/2018  7:30 AM Ward Givens, NP [facility] None

## 2018-05-20 ENCOUNTER — Other Ambulatory Visit: Payer: Self-pay | Admitting: Family Medicine

## 2018-05-25 ENCOUNTER — Ambulatory Visit: Payer: BLUE CROSS/BLUE SHIELD | Admitting: Family Medicine

## 2018-06-03 ENCOUNTER — Ambulatory Visit (INDEPENDENT_AMBULATORY_CARE_PROVIDER_SITE_OTHER): Payer: BLUE CROSS/BLUE SHIELD | Admitting: Internal Medicine

## 2018-06-03 ENCOUNTER — Encounter: Payer: Self-pay | Admitting: Internal Medicine

## 2018-06-03 DIAGNOSIS — E039 Hypothyroidism, unspecified: Secondary | ICD-10-CM | POA: Diagnosis not present

## 2018-06-03 NOTE — Progress Notes (Signed)
Virtual Visit via Telephone Note  I connected with [name] [name] on 06/03/18 at  3:20 PM EDT by telephone and verified that I am speaking with the correct person using two identifiers.   I discussed the limitations, risks, security and privacy concerns of performing an evaluation and management service by telephone and the availability of in person appointments. I also discussed with the patient that there may be a patient responsible charge related to this service. The patient expressed understanding and agreed to proceed.  -Location of the patient : Home  -Location of the provider : Office  -The names of all persons participating in the telemedicine service :      Name: RUFUS CYPERT  MRN/ DOB: [id number], 11-08-1965    Age/ Sex: 53 y.o., female     PCP: [name], DO   Reason for Endocrinology Evaluation: Hypothyroidism     Initial Endocrinology Clinic Visit: 04/14/2018     PATIENT IDENTIFIER: [name] [name] is a 53 y.o., female with a past medical history of HTN, RLS, Hypothyroidism, depression and dyslipidemia. She has followed with [facility] since 04/14/2018  for consultative assistance with management of her hypothyroidism   HISTORICAL SUMMARY: The patient was first diagnosed with hypothyroidism  >10 yrs ago. She was on Levothyroxine initially but switched to Nature-Throid due to difficult to control TFTs. She has been on Nature-throid for ~ 4 yrs prior to her presentation to our clinic and continues to have difficulty steadying her TSH. She was also on Cytomel for 3 months prior to her presentation without any improvement her symptoms of fatigue , hair loss or excessive sweating.   She was switched from nature-throid to Levothyroxine   SUBJECTIVE:   During last visit (04/14/2018): We stopped the nature-throid and cytomel and started levothyroxine 150 mcg daily   Today (06/03/2018):  [name] [name] is here for 7 week virtual check in on hypothyroidism.  Since being on the levothyroxine she has noted improvement in excessive sweating and fatigue, she continues with hair loss.   Her weight continues to fluctuate, denies depression or constipation.    She is on biotin     ROS:  As per HPI.   HISTORY:  Past Medical History:  Past Medical History:  Diagnosis Date  . Anxiety 01/12/2013  . Asthma   . Chicken pox   . Chronic insomnia 11/24/2016  . Clostridium difficile infection 04/02/2014, 02/06/2014  . Depression   . Dyslipidemia   . HTN (hypertension)   . Hypothyroidism   . Migraine   . Multiple allergies   . Obesity   . RLS (restless legs syndrome)     Past Surgical History:  Past Surgical History:  Procedure Laterality Date  . BREAST BIOPSY  [id number]   x 2 benign lesion  . CERVICAL DISCECTOMY  2003  . CERVICAL FUSION  2010   x2  . Hand fracture surgery    . LUMBAR FUSION  2016  . VAGINAL HYSTERECTOMY  2009     Social History:  reports that she has never smoked. She has never used smokeless tobacco. She reports that she does not drink alcohol or use drugs. Family History:  Family History  Problem Relation Age of Onset  . Hypertension Father   . Diabetes Father   . Heart disease Father   . Hearing loss Father   . Diabetes Brother   . Arthritis Mother   . Liver cancer Mother   . COPD Mother   .  Mental illness Mother   . Diabetes Mother   . Hearing loss Mother   . Heart disease Mother       HOME MEDICATIONS: Allergies as of 06/03/2018      Reactions   Bee Venom Swelling, Anaphylaxis   Biaxin [clarithromycin] Diarrhea, Rash   Gabapentin    Nausea   Abilify [aripiprazole] Other (See Comments)   MOOD SWING   Buspar [buspirone] Diarrhea   Lisinopril Cough, Other (See Comments)   Cough   Maxzide [triamterene-hctz] Rash   Phentermine Other (See Comments)   MOOD CHANGE   Seroquel [quetiapine Fumarate] Other (See Comments)   MOOD SWINGS      Medication List       Accurate as of June 03, 2018  3:21  PM. Always use your most recent med list.        buPROPion 300 MG 24 hr tablet Commonly known as:  WELLBUTRIN XL Take 1 tablet (300 mg total) by mouth daily.   cholecalciferol 1000 units tablet Commonly known as:  VITAMIN D Take 2,000 Units by mouth daily.   DULoxetine 60 MG capsule Commonly known as:  CYMBALTA Take 1 capsule (60 mg total) by mouth daily. Needs office visit prior to anymore refills.   estradiol 2 MG tablet Commonly known as:  ESTRACE Take 1 tablet by mouth once daily   Horizant 300 MG Tbcr Generic drug:  Gabapentin Enacarbil ER TAKE 2 TABLETS BY MOUTH EVERY DAY AT 5PM WITH FOOD   levothyroxine 150 MCG tablet Commonly known as:  SYNTHROID, LEVOTHROID Take 1 tablet (150 mcg total) by mouth daily.   meloxicam 15 MG tablet Commonly known as:  MOBIC Take 1 tablet (15 mg total) by mouth daily.   progesterone 100 MG capsule Commonly known as:  PROMETRIUM Take 1 capsule (100 mg total) by mouth every other day.   rOPINIRole 2 MG tablet Commonly known as:  REQUIP TAKE 1 TABLET BY MOUTH ONCE DAILY AT BEDTIME   rOPINIRole 4 MG 24 hr tablet Commonly known as:  REQUIP XL Take 1 tablet (4 mg total) by mouth daily.   telmisartan-hydrochlorothiazide 80-25 MG tablet Commonly known as:  MICARDIS HCT Take 1 tablet by mouth daily.          DATA REVIEWED: Results for [name] [name], [name] [name] (MRN [id number]) as of 06/03/2018 16:37  Ref. Range 03/29/2018 14:02  TSH Latest Ref Range: 0.35 - 4.50 uIU/mL 6.82 (H)      ASSESSMENT / PLAN / RECOMMENDATIONS:   1. Hypothyroidism  - Pt continues with non-specific symptoms, it is unclear to me at this time if this is related to her thyroid or not, as there has not been thyroid function testing since being on the Levothyroxine.  - Pt educated extensively on the correct way to take levothyroxine (first thing in the morning with water, 30 minutes before eating or taking other medications). - Pt encouraged to double dose the  following day if she were to miss a dose given long half-life of levothyroxine. - She was encouraged to obtain TFT's and advised to hold the biotin 3 days prior to her lab appointment.     Medications   Levothyroxine 150 mcg daily    I discussed the assessment and treatment plan with the patient. The patient was provided an opportunity to ask questions and all were answered. The patient agreed with the plan and demonstrated an understanding of the instructions.   The patient was advised to call back or seek an in-person evaluation  if the symptoms worsen or if the condition fails to improve as anticipated.  I provided 6 minutes of non-face-to-face time during this encounter.     F/U in 6 months    Signed electronically by: Mack Guise, MD  [facility]  [facility] [address] [place], [place] [address] Phone: [phone] FAX: [phone]   CC: Rossie Muskrat [address] [place] [address] Phone: [phone]  Fax: [phone]  Return to Endocrinology clinic as below: Future Appointments  Date Time Provider [place]  07/26/2018  2:30 PM [name], DO [place]  08/09/2018  7:30 AM Ward Givens, NP [facility] None

## 2018-07-02 ENCOUNTER — Other Ambulatory Visit: Payer: Self-pay | Admitting: Neurology

## 2018-07-23 ENCOUNTER — Other Ambulatory Visit: Payer: Self-pay | Admitting: Family Medicine

## 2018-07-23 DIAGNOSIS — I10 Essential (primary) hypertension: Secondary | ICD-10-CM

## 2018-07-23 DIAGNOSIS — F339 Major depressive disorder, recurrent, unspecified: Secondary | ICD-10-CM

## 2018-07-24 ENCOUNTER — Other Ambulatory Visit: Payer: Self-pay | Admitting: Adult Health

## 2018-07-26 ENCOUNTER — Ambulatory Visit: Payer: BLUE CROSS/BLUE SHIELD | Admitting: Family Medicine

## 2018-07-26 NOTE — Telephone Encounter (Signed)
Pt has appt tomorrow (07/27/2018). Will refill after appt.

## 2018-07-27 ENCOUNTER — Encounter: Payer: Self-pay | Admitting: Family Medicine

## 2018-07-27 ENCOUNTER — Other Ambulatory Visit: Payer: Self-pay

## 2018-07-27 ENCOUNTER — Ambulatory Visit (INDEPENDENT_AMBULATORY_CARE_PROVIDER_SITE_OTHER): Payer: BLUE CROSS/BLUE SHIELD | Admitting: Family Medicine

## 2018-07-27 VITALS — BP 121/60 | HR 91 | Ht 64.0 in

## 2018-07-27 DIAGNOSIS — M2550 Pain in unspecified joint: Secondary | ICD-10-CM | POA: Diagnosis not present

## 2018-07-27 DIAGNOSIS — Z79890 Hormone replacement therapy (postmenopausal): Secondary | ICD-10-CM

## 2018-07-27 DIAGNOSIS — R7309 Other abnormal glucose: Secondary | ICD-10-CM

## 2018-07-27 DIAGNOSIS — Z1239 Encounter for other screening for malignant neoplasm of breast: Secondary | ICD-10-CM

## 2018-07-27 DIAGNOSIS — I10 Essential (primary) hypertension: Secondary | ICD-10-CM

## 2018-07-27 DIAGNOSIS — F339 Major depressive disorder, recurrent, unspecified: Secondary | ICD-10-CM

## 2018-07-27 DIAGNOSIS — E039 Hypothyroidism, unspecified: Secondary | ICD-10-CM

## 2018-07-27 MED ORDER — ESTRADIOL 2 MG PO TABS: 2.0000 mg | ORAL_TABLET | Freq: Every day | ORAL | 2 refills | Status: DC

## 2018-07-27 MED ORDER — DULOXETINE HCL 60 MG PO CPEP: 60.0000 mg | ORAL_CAPSULE | Freq: Every day | ORAL | 1 refills | Status: DC

## 2018-07-27 MED ORDER — PROGESTERONE MICRONIZED 100 MG PO CAPS: 100.0000 mg | ORAL_CAPSULE | ORAL | 3 refills | Status: DC

## 2018-07-27 MED ORDER — BUPROPION HCL ER (XL) 300 MG PO TB24: 300.0000 mg | ORAL_TABLET | Freq: Every day | ORAL | 1 refills | Status: DC

## 2018-07-27 MED ORDER — TELMISARTAN-HCTZ 80-25 MG PO TABS: 1.0000 | ORAL_TABLET | Freq: Every day | ORAL | 1 refills | Status: DC

## 2018-07-27 MED ORDER — MELOXICAM 15 MG PO TABS: 15.0000 mg | ORAL_TABLET | Freq: Every day | ORAL | 1 refills | Status: DC

## 2018-07-27 NOTE — Progress Notes (Signed)
VIRTUAL VISIT VIA VIDEO  I connected with [name] on 07/27/18 at  2:40 PM EDT by a video enabled telemedicine application and verified that I am speaking with the correct person using two identifiers. Location patient: Home Location provider: [facility], Office Persons participating in the virtual visit: Patient, Dr. Raoul Pitch and R.Baker, LPN  I discussed the limitations of evaluation and management by telemedicine and the availability of in person appointments. The patient expressed understanding and agreed to proceed.   SUBJECTIVE Chief Complaint  Patient presents with  . Hypertension    Pt has no complaints. Pt needs refills on medications. Pt would like all meds to [facility], except restless leg medication goes to [facility] or printed   . Depression  . Insomnia  . Hypothyroidism    HPI: Recurrent major depressive disorder, remission status unspecified ([place]) Reports compliance with Wellbutrin 300 mg daily and Cymbalta 60 mg daily. She is tolerating these medications well without any side effects.  She is doing about the same.    Arthralgia, unspecified joint Tolerating  meloxicam and working well  for her. Acute back pain is much improved.   Hypertension/Morbid obesity/elevated a1c:Pt reports compliance  withmicardis80-25 milligrams. Patient denies chest pain, shortness of breath, dizziness or lower extremity edema.  ASA recommended.  Pt is notprescribed statin. BMP: 01/06/2017 within normal limits CBC: 01/06/2017 within normal limits Lipids: 04/03/2015 within normal limits Diet/exercise does not routinely watch diet and exercise NL:[id number], obesity,fhxheart disease  Hypothyroidism/Vit D def/HRT:  Thyroid is now managed by endocrine and she is taking levothyroxine 150 mcg QD. Levels are due to be checked- she has been on this dose for 5-6 months. She reports she is not taking the vitamin D or B12. She is taking the  Estrace and progesterone- she is overdue  for her mammogram.  Prior note:  Pt presents for an OV with complaints of increased hair loss since stopping the progesterone.  Progesterone was stopped secondary to she has had a hysterectomy and she has gained weight.  Last TSH was 1 year ago and normal.  She reports compliance with Armour thyroid 90 mg.  She states she was forced to get Armour Thyroid because nature thyroid is too difficult to obtain.  She was tried on levothyroxine in the past, and reports she was taken off that medication but she does not remember why.  She is wondering if she can try different thyroid medicine. ROS: See pertinent positives and negatives per HPI.  Patient Active Problem List   Diagnosis Date Noted  . B12 deficiency 01/31/2018  . Hormone replacement therapy (HRT) 07/20/2017  . Hair loss 07/20/2017  . Chronic insomnia 11/24/2016  . Elevated hemoglobin A1c 07/08/2016  . Hypertension 07/08/2016  . Arthralgia 07/08/2016  . Morbid obesity ([place]) 07/08/2016  . Wears glasses 07/08/2016  . Hypothyroidism   . Dyslipidemia   . Depression   . Mild memory disturbance 11/13/2013  . Restless legs syndrome (RLS) 01/12/2013    Social History   Tobacco Use  . Smoking status: Never Smoker  . Smokeless tobacco: Never Used  Substance Use Topics  . Alcohol use: No    Alcohol/week: 0.0 standard drinks    Current Outpatient Medications:  .  buPROPion (WELLBUTRIN XL) 300 MG 24 hr tablet, Take 1 tablet (300 mg total) by mouth daily., Disp: 90 tablet, Rfl: 1 .  DULoxetine (CYMBALTA) 60 MG capsule, Take 1 capsule (60 mg total) by mouth daily. Needs office visit prior to anymore refills.,  Disp: 90 capsule, Rfl: 0 .  estradiol (ESTRACE) 2 MG tablet, Take 1 tablet by mouth once daily, Disp: 90 tablet, Rfl: 0 .  HORIZANT 300 MG TBCR, TAKE 2 TABLETS BY MOUTH EVERY DAY AT 5PM WITH FOOD, Disp: 60 tablet, Rfl: 6 .  levothyroxine (SYNTHROID, LEVOTHROID) 150 MCG tablet, Take 1 tablet (150 mcg total) by mouth daily., Disp: 60  tablet, Rfl: 1 .  meloxicam (MOBIC) 15 MG tablet, Take 1 tablet (15 mg total) by mouth daily., Disp: 90 tablet, Rfl: 1 .  progesterone (PROMETRIUM) 100 MG capsule, Take 1 capsule (100 mg total) by mouth every other day., Disp: 45 capsule, Rfl: 3 .  rOPINIRole (REQUIP XL) 4 MG 24 hr tablet, Take 1 tablet (4 mg total) by mouth daily., Disp: 90 tablet, Rfl: 3 .  rOPINIRole (REQUIP) 2 MG tablet, Take 1/2 tablet at lunch and 1/2 tablet at bed time., Disp: 30 tablet, Rfl: 2 .  telmisartan-hydrochlorothiazide (MICARDIS HCT) 80-25 MG tablet, Take 1 tablet by mouth daily., Disp: 90 tablet, Rfl: 1 .  cholecalciferol (VITAMIN D) 1000 units tablet, Take 2,000 Units by mouth daily. , Disp: , Rfl:   Allergies  Allergen Reactions  . Bee Venom Swelling and Anaphylaxis  . Biaxin [Clarithromycin] Diarrhea and Rash  . Gabapentin     Nausea  . Abilify [Aripiprazole] Other (See Comments)    MOOD SWING  . Buspar [Buspirone] Diarrhea  . Lisinopril Cough and Other (See Comments)    Cough  . Maxzide [Triamterene-Hctz] Rash  . Phentermine Other (See Comments)    MOOD CHANGE   . Seroquel [Quetiapine Fumarate] Other (See Comments)    MOOD SWINGS    OBJECTIVE: BP 121/60   Pulse 91   Ht 5\' 4"  (1.626 m)   BMI 44.46 kg/m  Gen: No acute distress. Nontoxic in appearance. Obese, caucasian female.  HENT: AT. [place].  MMM.  Eyes:Pupils Equal Round Reactive to light, Extraocular movements intact,  Conjunctiva without redness, discharge or icterus. CV: noedema Chest: Cough or shortness of breath not present.  Skin: no rashes, purpura or petechiae.  Neuro:  Normal gait. Alert. Oriented x3  Psych: Normal affect, dress and demeanor. Normal speech. Normal thought content and judgment. Depression screen [facility] 2/9 07/27/2018 10/08/2017 05/03/2017 01/06/2017 07/07/2016  Decreased Interest 0 0 0 0 0  Down, Depressed, Hopeless 0 0 0 0 0  PHQ - 2 Score 0 0 0 0 0  Altered sleeping 3 3 - 3 -  Tired, decreased energy 3 3 - 0 -  Change  in appetite 3 1 - 0 -  Feeling bad or failure about yourself  0 0 - 0 -  Trouble concentrating 0 - - 0 -  Moving slowly or fidgety/restless 0 0 - 0 -  Suicidal thoughts 0 0 - 0 -  PHQ-9 Score 9 7 - 3 -  Difficult doing work/chores Not difficult at all Somewhat difficult - Not difficult at all -    ASSESSMENT AND PLAN: TRINA ASCH is a 53 y.o. female present for  hypothyroidism - now managed by endocrine and taking levothyroxine 150 mcg QD. Last TSH 03/2018- not rechecked since starting new med at that visit. Since collecting labs will collect thyroid labs for her and send to her endocrine.   - TSH - T4, free Recurrent major depressive disorder, remission status unspecified ([place]) stable. Continue Cymbalta 60 mg daily and Wellbutrin 300 mg daily, refilled. - DULoxetine (CYMBALTA) 60 MG capsule; Take 1 capsule (60 mg total) by  mouth daily. Needs office visit prior to anymore refills.  Dispense: 30 capsule; Refill: 0 - buPROPion (WELLBUTRIN XL) 300 MG 24 hr tablet; Take 1 tablet (300 mg total) by mouth daily.  Dispense: 90 tablet; Refill: 1 Hormone replacement therapy (HRT) Stable. continue estrogen and restart progesterone 100 mg every other day.  Refills provided today.  Needs mammogram updated yearly  if to remain on HRT- overdue. Mammogram ordered.  Arthralgia, unspecified joint Stable. Continue mobic.  - meloxicam (MOBIC) 15 MG tablet; Take 1 tablet (15 mg total) by mouth daily.  Dispense: 90 tablet; Refill: 1 Fatigue, unspecified type - B12-- not taking.  - vitd --> not taking again>> encouraged her to start2000 u daily with a meal. She has been rather low without supplementation advised her she will require this supplement everyday Essential hypertension/morbid obesity Stable. Continue micardis - CBC, CMP, lipid ordered today.  Low sodium.  - telmisartan-hydrochlorothiazide (MICARDIS HCT) 80-25 MG tablet; Take 1 tablet by mouth daily.  Dispense: 90 tablet; Refill: 1  F/u 6  mos.   > 25 minutes spent with patient, >50% of time spent face to face counseling and coordinating care.     Howard Pouch, DO 07/27/2018

## 2018-08-01 ENCOUNTER — Other Ambulatory Visit: Payer: Self-pay | Admitting: Internal Medicine

## 2018-08-02 ENCOUNTER — Ambulatory Visit (INDEPENDENT_AMBULATORY_CARE_PROVIDER_SITE_OTHER): Payer: BC Managed Care – PPO | Admitting: Family Medicine

## 2018-08-02 DIAGNOSIS — R7309 Other abnormal glucose: Secondary | ICD-10-CM

## 2018-08-02 DIAGNOSIS — I10 Essential (primary) hypertension: Secondary | ICD-10-CM | POA: Diagnosis not present

## 2018-08-02 DIAGNOSIS — E039 Hypothyroidism, unspecified: Secondary | ICD-10-CM

## 2018-08-02 LAB — COMPREHENSIVE METABOLIC PANEL
ALT: 20 U/L (ref 0–35)
AST: 12 U/L (ref 0–37)
Albumin: 3.9 g/dL (ref 3.5–5.2)
Alkaline Phosphatase: 72 U/L (ref 39–117)
BUN: 19 mg/dL (ref 6–23)
CO2: 32 mEq/L (ref 19–32)
Calcium: 9.6 mg/dL (ref 8.4–10.5)
Chloride: 99 mEq/L (ref 96–112)
Creatinine, Ser: 0.55 mg/dL (ref 0.40–1.20)
GFR: 116.02 mL/min (ref >60.00)
Glucose, Bld: 102 mg/dL — ABNORMAL HIGH (ref 70–99)
Potassium: 4.1 mEq/L (ref 3.5–5.1)
Sodium: 140 mEq/L (ref 135–145)
Total Bilirubin: 0.4 mg/dL (ref 0.2–1.2)
Total Protein: 6.5 g/dL (ref 6.0–8.3)

## 2018-08-02 LAB — CBC
HCT: 42.3 % (ref 36.0–46.0)
Hemoglobin: 14.3 g/dL (ref 12.0–15.0)
MCHC: 33.9 g/dL (ref 30.0–36.0)
MCV: 92.4 fl (ref 78.0–100.0)
Platelets: 300 10*3/uL (ref 150.0–400.0)
RBC: 4.58 Mil/uL (ref 3.87–5.11)
RDW: 13.8 % (ref 11.5–15.5)
WBC: 9.9 10*3/uL (ref 4.0–10.5)

## 2018-08-02 LAB — LIPID PANEL
Cholesterol: 150 mg/dL (ref 0–200)
HDL: 48.6 mg/dL (ref >39.00)
LDL Cholesterol: 65 mg/dL (ref 0–99)
NonHDL: 101.82
Total CHOL/HDL Ratio: 3
Triglycerides: 183 mg/dL — ABNORMAL HIGH (ref 0.0–149.0)
VLDL: 36.6 mg/dL (ref 0.0–40.0)

## 2018-08-02 LAB — TSH: TSH: 5.35 u[IU]/mL — ABNORMAL HIGH (ref 0.35–4.50)

## 2018-08-02 LAB — T4, FREE: Free T4: 1.11 ng/dL (ref 0.60–1.60)

## 2018-08-02 LAB — HEMOGLOBIN A1C: Hgb A1c MFr Bld: 6.5 % (ref 4.6–6.5)

## 2018-08-03 ENCOUNTER — Telehealth: Payer: Self-pay | Admitting: Internal Medicine

## 2018-08-03 ENCOUNTER — Telehealth: Payer: Self-pay | Admitting: Family Medicine

## 2018-08-03 MED ORDER — LEVOTHYROXINE SODIUM 150 MCG PO TABS: 150.0000 ug | ORAL_TABLET | Freq: Every day | ORAL | 2 refills | Status: DC

## 2018-08-03 MED ORDER — METFORMIN HCL 500 MG PO TABS: 500.0000 mg | ORAL_TABLET | Freq: Two times a day (BID) | ORAL | 1 refills | Status: DC

## 2018-08-03 NOTE — Telephone Encounter (Signed)
Please inform patient the following information: - Her labs were normal with the following exceptions.    - her TSH was mildly under replaced at 5.35, T4 normal>>> I have forwarded those to her endocrinologist.  - Her triglycerides were mildly above normal at 183 (normal <150). Higher fiber diet, routine exercise and adding omega 3 supplement (fish oil) can help keep triglycerides in normal range.  - her a1c is 6.5- This is the highest it has been. Her fasting glucose is < 126>>> therefore this is still considered Pre-diabetic but this is really a grey zone since 6.5 is considered diabetic. Starting a medication called metformin is indicated at this level of a1c to help avoid progession. It is not an insulin, and she does not have to monitor her sugars or fear of low sugars on this medication. Some people experience loose stools when starting med, which usually resolve over a few weeks if she were to experience. I have called this in for her. Mediterranean diet /lower sugar diet would also help, as well as routine exercise.  F/U 4 mos to recheck a1c on med, then we can return to every 6 mos follow ups after it is stable.

## 2018-08-03 NOTE — Telephone Encounter (Signed)
Spoke to pt and went over instructions, pt stated that she understood changes.

## 2018-08-03 NOTE — Telephone Encounter (Signed)
Pt was called and given all instructions, she verbalized understanding. My Chart message sent with all instructions for pt to have written instructions, she verbalized understanding. F/U appt was made

## 2018-08-03 NOTE — Telephone Encounter (Signed)
Tashia,    Please let her know that I received her  Thyroid function test from her PCP. She is currently on 150 mcg , thyroid a little off .   Recommendations   Take 2 tablets of Levothyroxine one day a week (Sunday for example) and take 1 tablet the rest of the week .  Recheck on next visit     Thanks   Abby Nena Jordan, MD  [facility]  [facility] [address] [place], [place] [address] Phone: [phone] FAX: [phone]

## 2018-08-04 ENCOUNTER — Telehealth: Payer: Self-pay | Admitting: *Deleted

## 2018-08-04 NOTE — Telephone Encounter (Signed)
Due to current COVID 19 pandemic, our office is severely reducing in office visits until further notice, in order to minimize the risk to our patients and healthcare providers.  Pt understands that although there may be some limitations with this type of visit, we will take all precautions to reduce any security or privacy concerns.  Pt understands that this will be treated like an in office visit and we will file with pt's insurance, and there may be a patient responsible charge related to this service. Consented to [email or web address] visit.  email sent to [email or web address].

## 2018-08-08 NOTE — Progress Notes (Signed)
    Virtual Visit via Video Note  I connected with [name] [name] on 08/09/18 at  7:45 AM EDT by a video enabled telemedicine application and verified that I am speaking with the correct person using two identifiers.  Location: Patient: At her home  Provider: At my home    I discussed the limitations of evaluation and management by telemedicine and the availability of in person appointments. The patient expressed understanding and agreed to proceed.  History of Present Illness: 08/09/2018 [name] is a 53 year old female with history of restless leg syndrome.  She reports her symptoms are under good control if she takes her medications.  She says she has to take medications every 4 hours, if she waits 13 hours, she will have symptoms.  She is currently taking Horizant 300 mg at 7 AM, again at 7 PM.  She is taking Requip ER.4 mg at 7 AM, Requip IR 2 mg at 7 PM.  If she takes her medications on schedule, she does not have any issues.  She indicates she needs refills of her medications.  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  Recently she has been diagnosed with diabetes, had an A1c of 6.5, was started on metformin.  11/24/2017 MM: [name] is a 53 year old f emale with a history of restless leg syndrome.  She returns today for follow-up.  At the last visit she was waking up in the middle the night with restless leg symptoms.  She states that now the symptoms are not affecting her sleep.  She states that she is having more restless leg symptoms during the day.  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  She continues to take Horizant in the morning and before bedtime.  She takes Requip extended release 4 mg in the morning and Requip immediate release 2mg  at bedtime.  She has not tried taking Requip during the day due to possible drowsiness.  She returns today for an evaluation   Observations/Objective: Alert, answers questions appropriately, follows commands, speech is clear and  concise, no arm drift, no problems with gait  Assessment and Plan: 1.  Restless leg syndrome  Overall she indicates that she is doing quite well, as long as she adheres to her medication regimen.  Currently she is taking: -Horizant 300 mg, 7 AM, 7 PM -Requip XL 4 mg tablet, 7 AM -Requip IR 2 mg tablet, 7 PM  I have provided refills for Requip.  She will continue her current medication regimen.  She indicates she is tolerating the medications without problem. She will call for any problems or concerns.  She will follow-up on an annual basis.   Follow Up Instructions: 1 year, 08/15/2019 3:45   I discussed the assessment and treatment plan with the patient. The patient was provided an opportunity to ask questions and all were answered. The patient agreed with the plan and demonstrated an understanding of the instructions.   The patient was advised to call back or seek an in-person evaluation if the symptoms worsen or if the condition fails to improve as anticipated.  I provided 15 minutes of non-face-to-face time during this encounter.   Evangeline Dakin, DNP  [facility] [address], [place], [place] [address] [phone]

## 2018-08-09 ENCOUNTER — Encounter: Payer: Self-pay | Admitting: Neurology

## 2018-08-09 ENCOUNTER — Other Ambulatory Visit: Payer: Self-pay

## 2018-08-09 ENCOUNTER — Ambulatory Visit (INDEPENDENT_AMBULATORY_CARE_PROVIDER_SITE_OTHER): Payer: BC Managed Care – PPO | Admitting: Neurology

## 2018-08-09 ENCOUNTER — Ambulatory Visit: Payer: BLUE CROSS/BLUE SHIELD | Admitting: Adult Health

## 2018-08-09 DIAGNOSIS — G2581 Restless legs syndrome: Secondary | ICD-10-CM | POA: Diagnosis not present

## 2018-08-09 MED ORDER — ROPINIROLE HCL ER 4 MG PO TB24: 4.0000 mg | ORAL_TABLET | Freq: Every day | ORAL | 3 refills | Status: DC

## 2018-08-09 MED ORDER — ROPINIROLE HCL 2 MG PO TABS: ORAL_TABLET | ORAL | 3 refills | Status: DC

## 2018-08-09 NOTE — Progress Notes (Signed)
I have read the note, and I agree with the clinical assessment and plan.  Dianelys Scinto K Leshea Jaggers   

## 2018-08-29 ENCOUNTER — Encounter: Payer: Self-pay | Admitting: Family Medicine

## 2018-08-31 ENCOUNTER — Other Ambulatory Visit: Payer: Self-pay

## 2018-08-31 ENCOUNTER — Ambulatory Visit (INDEPENDENT_AMBULATORY_CARE_PROVIDER_SITE_OTHER): Payer: BC Managed Care – PPO | Admitting: Family Medicine

## 2018-08-31 ENCOUNTER — Encounter: Payer: Self-pay | Admitting: Family Medicine

## 2018-08-31 VITALS — BP 129/83 | HR 86 | Temp 97.8°F | Resp 17 | Ht 64.0 in | Wt 254.2 lb

## 2018-08-31 DIAGNOSIS — M5441 Lumbago with sciatica, right side: Secondary | ICD-10-CM | POA: Diagnosis not present

## 2018-08-31 MED ORDER — METHYLPREDNISOLONE ACETATE 80 MG/ML IJ SUSP
80.0000 mg | Freq: Once | INTRAMUSCULAR | Status: AC
  Administered 2018-08-31: 80 mg via INTRAMUSCULAR

## 2018-08-31 MED ORDER — CYCLOBENZAPRINE HCL 5 MG PO TABS: 5.0000 mg | ORAL_TABLET | Freq: Three times a day (TID) | ORAL | 1 refills | Status: DC | PRN

## 2018-08-31 MED ORDER — PREDNISONE 20 MG PO TABS: ORAL_TABLET | ORAL | 0 refills | Status: DC

## 2018-08-31 NOTE — Patient Instructions (Signed)
Start flexeril right away.  Rest, heat and TENS unit.  Start light stretches in 1 week.  Start prednisone tomorrow.  Follow up in 2-3 weeks if not improving. This can take up to 6 weeks to completely resolve.    Sacroiliac Joint Dysfunction  Sacroiliac joint dysfunction is a condition that causes inflammation on one or both sides of the sacroiliac (SI) joint. The SI joint connects the lower part of the spine (sacrum) with the two upper portions of the pelvis (ilium). This condition causes deep aching or burning pain in the low back. In some cases, the pain may also spread into one or both buttocks, hips, or thighs. What are the causes? This condition may be caused by:  Pregnancy. During pregnancy, extra stress is put on the SI joints because the pelvis widens.  Injury, such as: ? Injuries from car accidents. ? Sports-related injuries. ? Work-related injuries.  Having one leg that is shorter than the other.  Conditions that affect the joints, such as: ? Rheumatoid arthritis. ? Gout. ? Psoriatic arthritis. ? Joint infection (septic arthritis). Sometimes, the cause of SI joint dysfunction is not known. What are the signs or symptoms? Symptoms of this condition include:  Aching or burning pain in the lower back. The pain may also spread to other areas, such as: ? Buttocks. ? Groin. ? Thighs.  Muscle spasms in or around the painful areas.  Increased pain when standing, walking, running, stair climbing, bending, or lifting. How is this diagnosed? This condition is diagnosed with a physical exam and medical history. During the exam, the health care provider may move one or both of your legs to different positions to check for pain. Various tests may be done to confirm the diagnosis, including:  Imaging tests to look for other causes of pain. These may include: ? MRI. ? CT scan. ? Bone scan.  Diagnostic injection. A numbing medicine is injected into the SI joint using a needle.  If your pain is temporarily improved or stopped after the injection, this can indicate that SI joint dysfunction is the problem. How is this treated? Treatment depends on the cause and severity of your condition. Treatment options may include:  Ice or heat applied to the lower back area after an injury. This may help reduce pain and muscle spasms.  Medicines to relieve pain or inflammation or to relax the muscles.  Wearing a back brace (sacroiliac brace) to help support the joint while your back is healing.  Physical therapy to increase muscle strength around the joint and flexibility at the joint. This may also involve learning proper body positions and ways of moving to relieve stress on the joint.  Direct manipulation of the SI joint.  Injections of steroid medicine into the joint to reduce pain and swelling.  Radiofrequency ablation to burn away nerves that are carrying pain messages from the joint.  Use of a device that provides electrical stimulation to help reduce pain at the joint.  Surgery to put in screws and plates that limit or prevent joint motion. This is rare. Follow these instructions at home: Medicines  Take over-the-counter and prescription medicines only as told by your health care provider.  Do not drive or use heavy machinery while taking prescription pain medicine.  If you are taking prescription pain medicine, take actions to prevent or treat constipation. Your health care provider may recommend that you: ? Drink enough fluid to keep your urine pale yellow. ? Eat foods that are high in  fiber, such as fresh fruits and vegetables, whole grains, and beans. ? Limit foods that are high in fat and processed sugars, such as fried or sweet foods. ? Take an over-the-counter or prescription medicine for constipation. If you have a brace:  Wear the brace as told by your health care provider. Remove it only as told by your health care provider.  Keep the brace clean.   If the brace is not waterproof: ? Do not let it get wet. ? Cover it with a watertight covering when you take a bath or a shower. Managing pain, stiffness, and swelling      Icing can help with pain and swelling. Heat may help with muscle tension or spasms. Ask your health care provider if you should use ice or heat.  If directed, put ice on the affected area: ? If you have a removable brace, remove it as told by your health care provider. ? Put ice in a plastic bag. ? Place a towel between your skin and the bag. ? Leave the ice on for 20 minutes, 2-3 times a day.  If directed, apply heat to the affected area. Use the heat source that your health care provider recommends, such as a moist heat pack or a heating pad. ? Place a towel between your skin and the heat source. ? Leave the heat on for 20-30 minutes. ? Remove the heat if your skin turns bright red. This is especially important if you are unable to feel pain, heat, or cold. You may have a greater risk of getting burned. General instructions  Rest as needed. Ask your health care provider what activities are safe for you.  Return to your normal activities as told by your health care provider.  Exercise as directed by your health care provider or physical therapist.  Do not use any products that contain nicotine or tobacco, such as cigarettes and e-cigarettes. These can delay bone healing. If you need help quitting, ask your health care provider.  Keep all follow-up visits as told by your health care provider. This is important. Contact a health care provider if:  Your pain is not controlled with medicine.  You have a fever.  Your pain is getting worse. Get help right away if:  You have weakness, numbness, or tingling in your legs or feet.  You lose control of your bladder or bowel. Summary  Sacroiliac joint dysfunction is a condition that causes inflammation on one or both sides of the sacroiliac (SI) joint.  This  condition causes deep aching or burning pain in the low back. In some cases, the pain may also spread into one or both buttocks, hips, or thighs.  Treatment depends on the cause and severity of your condition. It may include medicines to reduce pain and swelling or to relax muscles. This information is not intended to replace advice given to you by your health care provider. Make sure you discuss any questions you have with your health care provider. Document Released: 05/15/2008 Document Revised: 10/13/2017 Document Reviewed: 03/29/2017 Elsevier Patient Education  2020 [employer].

## 2018-08-31 NOTE — Progress Notes (Signed)
[name] [name] , 1965/03/22, 53 y.o., female MRN: [id number] Patient Care Team    Relationship Specialty Notifications Start End  [name], DO PCP - General Family Medicine  07/07/16   Ob/Gyn, [name]    07/08/16   Wilford Corner, MD Consulting Physician Gastroenterology  07/08/16   [name], MD Consulting Physician Neurology  07/08/16   [name], MD Attending Physician Endocrinology  07/08/16    Comment: Pt established with Si Raider, NP at this location. - [facility]    Chief Complaint  Patient presents with  . Back Pain    x8 days ago, no injury or trauma. Lower Right side that radiates into right leg.      Subjective: Pt presents for an OV with complaints of back pain  of 8 days duration.  Associated symptoms include pain in her right lower back that radiates to her buttocks.  States it hurts to stand, walk or lay down.  She denies any injury.  She has a history of lumbar fusion.  She denies any increase in activity prior to onset of discomfort.  She has been trying to rest and thought it was getting better until yesterday it started to make a turn for the worst. Pt has tried mobic to ease their symptoms.   Depression screen [facility] 2/9 07/27/2018 10/08/2017 05/03/2017 01/06/2017 07/07/2016  Decreased Interest 0 0 0 0 0  Down, Depressed, Hopeless 0 0 0 0 0  PHQ - 2 Score 0 0 0 0 0  Altered sleeping 3 3 - 3 -  Tired, decreased energy 3 3 - 0 -  Change in appetite 3 1 - 0 -  Feeling bad or failure about yourself  0 0 - 0 -  Trouble concentrating 0 - - 0 -  Moving slowly or fidgety/restless 0 0 - 0 -  Suicidal thoughts 0 0 - 0 -  PHQ-9 Score 9 7 - 3 -  Difficult doing work/chores Not difficult at all Somewhat difficult - Not difficult at all -    Allergies  Allergen Reactions  . Bee Venom Swelling and Anaphylaxis  . Biaxin [Clarithromycin] Diarrhea and Rash  . Gabapentin     Nausea  . Abilify [Aripiprazole] Other (See Comments)    MOOD  SWING  . Buspar [Buspirone] Diarrhea  . Lisinopril Cough and Other (See Comments)    Cough  . Maxzide [Triamterene-Hctz] Rash  . Phentermine Other (See Comments)    MOOD CHANGE   . Seroquel [Quetiapine Fumarate] Other (See Comments)    MOOD SWINGS   Social History   Social History Narrative   Patient is married [name])  2 children [name] and Courtland)   Patient is right handed.   Patient has a college education. 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    Patient drinks 1 cups daily. Uses herbal remedies, takes a daily vitamin.   Wears her seatbelt, smoke detector in the home.   Past Medical History:  Diagnosis Date  . Anxiety 01/12/2013  . Asthma   . Chicken pox   . Chronic insomnia 11/24/2016  . Clostridium difficile infection 04/02/2014, 02/06/2014  . Depression   . Dyslipidemia   . HTN (hypertension)   . Hypothyroidism   . Migraine   . Multiple allergies   . Obesity   . RLS (restless legs syndrome)    Past Surgical History:  Procedure Laterality Date  . BREAST BIOPSY  [id number]   x 2 benign lesion  .  CERVICAL DISCECTOMY  2003  . CERVICAL FUSION  2010   x2  . Hand fracture surgery    . LUMBAR FUSION  2016  . VAGINAL HYSTERECTOMY  2009   Family History  Problem Relation Age of Onset  . Hypertension Father   . Diabetes Father   . Heart disease Father   . Hearing loss Father   . Diabetes Brother   . Arthritis Mother   . Liver cancer Mother   . COPD Mother   . Mental illness Mother   . Diabetes Mother   . Hearing loss Mother   . Heart disease Mother    Allergies as of 08/31/2018      Reactions   Bee Venom Swelling, Anaphylaxis   Biaxin [clarithromycin] Diarrhea, Rash   Gabapentin    Nausea   Abilify [aripiprazole] Other (See Comments)   MOOD SWING   Buspar [buspirone] Diarrhea   Lisinopril Cough, Other (See Comments)   Cough   Maxzide [triamterene-hctz] Rash   Phentermine Other (See Comments)   MOOD CHANGE   Seroquel [quetiapine Fumarate] Other (See Comments)    MOOD SWINGS      Medication List       Accurate as of August 31, 2018 12:43 PM. If you have any questions, ask your nurse or doctor.        buPROPion 300 MG 24 hr tablet Commonly known as: WELLBUTRIN XL Take 1 tablet (300 mg total) by mouth daily.   cholecalciferol 1000 units tablet Commonly known as: VITAMIN D Take 2,000 Units by mouth daily.   cyclobenzaprine 5 MG tablet Commonly known as: FLEXERIL Take 1 tablet (5 mg total) by mouth 3 (three) times daily as needed for muscle spasms. Started by: Howard Pouch, DO   DULoxetine 60 MG capsule Commonly known as: CYMBALTA Take 1 capsule (60 mg total) by mouth daily. Needs office visit prior to anymore refills.   estradiol 2 MG tablet Commonly known as: ESTRACE Take 1 tablet (2 mg total) by mouth daily.   Horizant 300 MG Tbcr Generic drug: Gabapentin Enacarbil ER TAKE 2 TABLETS BY MOUTH EVERY DAY AT 5PM WITH FOOD   levothyroxine 150 MCG tablet Commonly known as: SYNTHROID Take 1 tablet (150 mcg total) by mouth daily. 8 tablets weekly   meloxicam 15 MG tablet Commonly known as: MOBIC Take 1 tablet (15 mg total) by mouth daily.   metFORMIN 500 MG tablet Commonly known as: GLUCOPHAGE Take 1 tablet (500 mg total) by mouth 2 (two) times daily with a meal.   predniSONE 20 MG tablet Commonly known as: DELTASONE 40 mg x3d, 20 mg x3d, 10 mg x2d Start taking on: September 01, 2018 Started by: Howard Pouch, DO   progesterone 100 MG capsule Commonly known as: PROMETRIUM Take 1 capsule (100 mg total) by mouth every other day.   rOPINIRole 2 MG tablet Commonly known as: REQUIP Take 1/2 tablet at lunch and 1/2 tablet at bed time.   rOPINIRole 4 MG 24 hr tablet Commonly known as: REQUIP XL Take 1 tablet (4 mg total) by mouth daily.   telmisartan-hydrochlorothiazide 80-25 MG tablet Commonly known as: MICARDIS HCT Take 1 tablet by mouth daily.       All past medical history, surgical history, allergies, family history,  immunizations andmedications were updated in the EMR today and reviewed under the history and medication portions of their EMR.     ROS: Negative, with the exception of above mentioned in HPI   Objective:  BP 129/83 (BP  Location: Left Arm, Patient Position: Sitting, Cuff Size: Normal)   Pulse 86   Temp 97.8 F (36.6 C) (Temporal)   Resp 17   Ht 5\' 4"  (1.626 m)   Wt 254 lb 4 oz (115.3 kg)   SpO2 98%   BMI 43.64 kg/m  Body mass index is 43.64 kg/m. Gen: Afebrile. No acute distress. Nontoxic in appearance, well developed, well nourished.  Pleasant, obese Caucasian female. MSK: No erythema, midline lumbar scar from prior fusion.  No bony tenderness to palpation.  Pain with range of motion of right sidebending and extension.  No pain with flexion until returning to standing.  Tender to palpation right SI.  Mild left paraspinal muscle spasm.  Muscle skeletal strength 5/5 bilateral lower extremity with discomfort on resisted knee flexion of her right lower extremity.  Was unable to perform straight leg raises or FABRE secondary to patient wearing a TENS unit.  Neurovascular intact distally. Skin: No rashes, purpura or petechiae.  Neuro: Slow guarded gait. PERLA. EOMi. Alert. Oriented x3  Psych: Normal affect, dress and demeanor. Normal speech. Normal thought content and judgment.  No exam data present No results found. No results found for this or any previous visit (from the past 24 hour(s)).  Assessment/Plan: MADALINE LEFEBER is a 53 y.o. female present for OV for  Acute right-sided low back pain with right-sided sciatica Patient's exam is consistent with right SI joint dysfunction/tenderness and lumbar pain with right-sided sciatica. -Rest.  Bone Medrol injection provided today.  Start prednisone taper tomorrow.  Start Flexeril 3 times daily as needed today.  Apply heat. -1 week start gentle stretches, which were provided to her today. - methylPREDNISolone acetate (DEPO-MEDROL) injection  80 mg -If not improving after 2 weeks, or worsening follow-up.   Reviewed expectations re: course of current medical issues.  Discussed self-management of symptoms.  Outlined signs and symptoms indicating need for more acute intervention.  Patient verbalized understanding and all questions were answered.  Patient received an After-Visit Summary.    No orders of the defined types were placed in this encounter.  > 25 minutes spent with patient, >50% of time spent face to face     Note is dictated utilizing voice recognition software. Although note has been proof read prior to signing, occasional typographical errors still can be missed. If any questions arise, please do not hesitate to call for verification.   electronically signed by:  Howard Pouch, DO  [place]

## 2018-09-01 ENCOUNTER — Telehealth: Payer: Self-pay | Admitting: Family Medicine

## 2018-09-01 NOTE — Telephone Encounter (Signed)
Patient would like a call back because pain has spread to legs and back and it's very painful.

## 2018-09-01 NOTE — Telephone Encounter (Signed)
Tried to call pt back and went to VM with no answer. Since it is a long weekend and Dr Lucita Lora half day pt may need to go to ED if it is getting worse after trying tx she was given yesterday

## 2018-09-02 ENCOUNTER — Emergency Department (HOSPITAL_COMMUNITY)
Admission: EM | Admit: 2018-09-02 | Discharge: 2018-09-02 | Disposition: A | Discharge status: Home / Self Care | Payer: BC Managed Care – PPO | Attending: Emergency Medicine | Admitting: Emergency Medicine

## 2018-09-02 ENCOUNTER — Emergency Department (HOSPITAL_COMMUNITY): Payer: BC Managed Care – PPO

## 2018-09-02 ENCOUNTER — Encounter (HOSPITAL_COMMUNITY): Payer: Self-pay | Admitting: Emergency Medicine

## 2018-09-02 ENCOUNTER — Other Ambulatory Visit: Payer: Self-pay

## 2018-09-02 DIAGNOSIS — Z8709 Personal history of other diseases of the respiratory system: Secondary | ICD-10-CM | POA: Diagnosis not present

## 2018-09-02 DIAGNOSIS — R339 Retention of urine, unspecified: Secondary | ICD-10-CM | POA: Diagnosis not present

## 2018-09-02 DIAGNOSIS — I10 Essential (primary) hypertension: Secondary | ICD-10-CM | POA: Insufficient documentation

## 2018-09-02 DIAGNOSIS — M5441 Lumbago with sciatica, right side: Secondary | ICD-10-CM | POA: Insufficient documentation

## 2018-09-02 DIAGNOSIS — E039 Hypothyroidism, unspecified: Secondary | ICD-10-CM | POA: Diagnosis not present

## 2018-09-02 DIAGNOSIS — M48061 Spinal stenosis, lumbar region without neurogenic claudication: Secondary | ICD-10-CM | POA: Diagnosis not present

## 2018-09-02 DIAGNOSIS — M545 Low back pain: Secondary | ICD-10-CM | POA: Diagnosis not present

## 2018-09-02 LAB — URINALYSIS, ROUTINE W REFLEX MICROSCOPIC
Bilirubin Urine: NEGATIVE
Glucose, UA: NEGATIVE mg/dL
Hgb urine dipstick: NEGATIVE
Ketones, ur: NEGATIVE mg/dL
Leukocytes,Ua: NEGATIVE
Nitrite: NEGATIVE
Protein, ur: 30 mg/dL — AB
Specific Gravity, Urine: 1.031 — ABNORMAL HIGH (ref 1.005–1.030)
pH: 5 (ref 5.0–8.0)

## 2018-09-02 MED ORDER — KETOROLAC TROMETHAMINE 30 MG/ML IJ SOLN
30.0000 mg | Freq: Once | INTRAMUSCULAR | Status: AC
  Administered 2018-09-02: 30 mg via INTRAMUSCULAR
  Filled 2018-09-02: qty 1

## 2018-09-02 MED ORDER — DIAZEPAM 5 MG PO TABS: 5.0000 mg | ORAL_TABLET | Freq: Three times a day (TID) | ORAL | 0 refills | Status: DC | PRN

## 2018-09-02 MED ORDER — DIAZEPAM 5 MG PO TABS
5.0000 mg | ORAL_TABLET | Freq: Once | ORAL | Status: AC
  Administered 2018-09-02: 5 mg via ORAL
  Filled 2018-09-02: qty 1

## 2018-09-02 NOTE — ED Notes (Signed)
Patient transported to MRI 

## 2018-09-02 NOTE — Telephone Encounter (Signed)
Pt called back. °

## 2018-09-02 NOTE — Discharge Instructions (Signed)

## 2018-09-02 NOTE — Consult Note (Signed)
Chief Complaint   Chief Complaint  Patient presents with  . Back Pain    HPI   Consult requested by: Dr Laverta Baltimore Reason for consult: Back pain, lumbar spinal stenosis  HPI: [name] [name] is a 53 y.o. female with history HTN, RLS, depression, dyslipidemia who presented to [facility] ED with severe back and right lower extremity pain. Symptoms started 9 days ago without known injury. Initially pain just affected midline lower back but has progressed and radiates into right buttock and down posterior right leg to heel. Associated with numbness and tingling in the same distribution. She went to PCP several days ago and was rx medrol dose pack, NSAID and muscle relaxer without relief. Pain became so severe today that she came to ED. She endorses difficulty starting to urinate due to pain, but when pain settles, she is able to urinate. She has voided in ED with post residual void <27ml. She denies weakness in LE. She had prior surgery by Dr Ronnald Ramp, L2-3 and L4-5 several years ago. Pain now was more severe than when she required surgery. Because of this EDP ordered an MRI of her lumbar spine which revealed multilevel degenerative changes with most prominent findings at L3-4 where there is moderate-severe spinal stenosis. NSY consultation requested. Since being in ED, pain has drastically improved, initially 10/10, now 2/10.  Patient Active Problem List   Diagnosis Date Noted  . B12 deficiency 01/31/2018  . Hormone replacement therapy (HRT) 07/20/2017  . Hair loss 07/20/2017  . Chronic insomnia 11/24/2016  . Elevated hemoglobin A1c 07/08/2016  . Hypertension 07/08/2016  . Arthralgia 07/08/2016  . Morbid obesity ([place]) 07/08/2016  . Wears glasses 07/08/2016  . Hypothyroidism   . Dyslipidemia   . Depression   . Mild memory disturbance 11/13/2013  . Restless legs syndrome (RLS) 01/12/2013    PMH: Past Medical History:  Diagnosis Date  . Anxiety 01/12/2013  . Asthma   . Chicken pox   . Chronic  insomnia 11/24/2016  . Clostridium difficile infection 04/02/2014, 02/06/2014  . Depression   . Dyslipidemia   . HTN (hypertension)   . Hypothyroidism   . Migraine   . Multiple allergies   . Obesity   . RLS (restless legs syndrome)     PSH: Past Surgical History:  Procedure Laterality Date  . BREAST BIOPSY  [id number]   x 2 benign lesion  . CERVICAL DISCECTOMY  2003  . CERVICAL FUSION  2010   x2  . Hand fracture surgery    . LUMBAR FUSION  2016  . VAGINAL HYSTERECTOMY  2009    (Not in a hospital admission)   SH: Social History   Tobacco Use  . Smoking status: Never Smoker  . Smokeless tobacco: Never Used  Substance Use Topics  . Alcohol use: No    Alcohol/week: 0.0 standard drinks  . Drug use: No    MEDS: Prior to Admission medications   Medication Sig Start Date End Date Taking? Authorizing Provider  acetaminophen (TYLENOL) 500 MG tablet Take 1,000 mg by mouth every 6 (six) hours as needed for headache (pain).   Yes [provider]  buPROPion (WELLBUTRIN XL) 300 MG 24 hr tablet Take 1 tablet (300 mg total) by mouth daily. Patient taking differently: Take 300 mg by mouth at bedtime.  07/27/18  Yes Kuneff, Renee A, DO  cholecalciferol (VITAMIN D) 1000 units tablet Take 2,000 Units by mouth daily.    Yes [provider]  cyclobenzaprine (FLEXERIL) 5 MG tablet Take  1 tablet (5 mg total) by mouth 3 (three) times daily as needed for muscle spasms. 08/31/18  Yes Kuneff, Renee A, DO  DULoxetine (CYMBALTA) 60 MG capsule Take 1 capsule (60 mg total) by mouth daily. Needs office visit prior to anymore refills. Patient taking differently: Take 60 mg by mouth at bedtime. Needs office visit prior to anymore refills. 07/27/18  Yes Kuneff, Renee A, DO  estradiol (ESTRACE) 2 MG tablet Take 1 tablet (2 mg total) by mouth daily. 07/27/18  Yes Kuneff, Renee A, DO  HORIZANT 300 MG TBCR TAKE 2 TABLETS BY MOUTH EVERY DAY AT 5PM WITH FOOD Patient taking differently: Take 300 mg  by mouth 2 (two) times a day.  07/26/18  Yes [name], MD  levothyroxine (SYNTHROID) 150 MCG tablet Take 1 tablet (150 mcg total) by mouth daily. 8 tablets weekly Patient taking differently: Take 150 mcg by mouth See admin instructions. Take 2 tablets (300 mcg) by mouth on Thursday mornings, take 1 tablet (150 mcg) on all other mornings 08/03/18  Yes Shamleffer, Melanie Crazier, MD  meloxicam (MOBIC) 15 MG tablet Take 1 tablet (15 mg total) by mouth daily. 07/27/18  Yes Kuneff, Renee A, DO  metFORMIN (GLUCOPHAGE) 500 MG tablet Take 1 tablet (500 mg total) by mouth 2 (two) times daily with a meal. 08/03/18  Yes Kuneff, Renee A, DO  Omega-3 Fatty Acids (FISH OIL PO) Take 1 capsule by mouth daily.   Yes [provider]  predniSONE (DELTASONE) 20 MG tablet 40 mg x3d, 20 mg x3d, 10 mg x2d Patient taking differently: Take 10-40 mg by mouth See admin instructions. Tapered course started 09/01/18: take 2 tablets (40 mg) by mouth daily for 3 days, then take 1 tablet (20 mg) daily for 3 days, then take 1/2 tablet (10 mg) daily for 2 days, then stop 09/01/18  Yes Kuneff, Renee A, DO  progesterone (PROMETRIUM) 100 MG capsule Take 1 capsule (100 mg total) by mouth every other day. 07/27/18  Yes Kuneff, Renee A, DO  rOPINIRole (REQUIP XL) 4 MG 24 hr tablet Take 1 tablet (4 mg total) by mouth daily. 08/09/18  Yes Suzzanne Cloud, NP  rOPINIRole (REQUIP) 2 MG tablet Take 1/2 tablet at lunch and 1/2 tablet at bed time. Patient taking differently: Take 2 mg by mouth at bedtime.  08/09/18  Yes Suzzanne Cloud, NP  telmisartan-hydrochlorothiazide (MICARDIS HCT) 80-25 MG tablet Take 1 tablet by mouth daily. Patient taking differently: Take 1 tablet by mouth at bedtime.  07/27/18  Yes Kuneff, Renee A, DO    ALLERGY: Allergies  Allergen Reactions  . Bee Venom Swelling and Anaphylaxis  . Biaxin [Clarithromycin] Diarrhea and Rash  . Gabapentin Nausea And Vomiting    No reaction to Horizant  . Abilify [Aripiprazole]  Other (See Comments)    MOOD SWING  . Buspar [Buspirone] Diarrhea  . Lisinopril Cough  . Maxzide [Triamterene-Hctz] Rash  . Phentermine Other (See Comments)    MOOD CHANGE   . Seroquel [Quetiapine Fumarate] Other (See Comments)    MOOD SWINGS    Social History   Tobacco Use  . Smoking status: Never Smoker  . Smokeless tobacco: Never Used  Substance Use Topics  . Alcohol use: No    Alcohol/week: 0.0 standard drinks     Family History  Problem Relation Age of Onset  . Hypertension Father   . Diabetes Father   . Heart disease Father   . Hearing loss Father   . Diabetes Brother   .  Arthritis Mother   . Liver cancer Mother   . COPD Mother   . Mental illness Mother   . Diabetes Mother   . Hearing loss Mother   . Heart disease Mother      ROS   ROS  Exam   Vitals:   09/02/18 1700 09/02/18 2030  BP: 133/77 (!) 147/60  Pulse: 92 98  Resp: 15 16  Temp:    SpO2: 95% 95%   General appearance: WDWN, NAD Eyes: No scleral injection Cardiovascular: Regular rate and rhythm without murmurs, rubs, gallops. No edema or variciosities. Distal pulses normal. Pulmonary: Effort normal, non-labored breathing Musculoskeletal:     Muscle tone upper extremities: Normal    Muscle tone lower extremities: Normal    Motor exam: Upper Extremities Deltoid Bicep Tricep Grip  Right 5/5 5/5 5/5 5/5  Left 5/5 5/5 5/5 5/5   Lower Extremity IP Quad PF DF EHL  Right 4+/5 5/5 5/5 5/5 5/5  Left 5/5 5/5 5/5 5/5 5/5   Neurological Mental Status:    - Patient is awake, alert, oriented to person, place, month, year, and situation    - Patient is able to give a clear and coherent history.    - No signs of aphasia or neglect Cranial Nerves    - II: Visual Fields are full. PERRL    - III/IV/VI: EOMI without ptosis or diploplia.     - V: Facial sensation is grossly normal    - VII: Facial movement is symmetric.     - VIII: hearing is intact to voice    - X: Uvula elevates symmetrically     - XI: Shoulder shrug is symmetric.    - XII: tongue is midline without atrophy or fasciculations.  Sensory: Sensation grossly intact to LT Deep Tendon Reflexes    - 2+ and symmetric in the biceps and patellae.  Plantars   - Toes are downgoing bilaterally.    Results - Imaging/Labs   Results for orders placed or performed during the hospital encounter of 09/02/18 (from the past 48 hour(s))  Urinalysis, Routine w reflex microscopic     Status: Abnormal   Collection Time: 09/02/18  5:23 PM  Result Value Ref Range   Color, Urine AMBER (A) YELLOW    Comment: BIOCHEMICALS MAY BE AFFECTED BY COLOR   APPearance CLOUDY (A) CLEAR   Specific Gravity, Urine 1.031 (H) 1.005 - 1.030   pH 5.0 5.0 - 8.0   Glucose, UA NEGATIVE NEGATIVE mg/dL   Hgb urine dipstick NEGATIVE NEGATIVE   Bilirubin Urine NEGATIVE NEGATIVE   Ketones, ur NEGATIVE NEGATIVE mg/dL   Protein, ur 30 (A) NEGATIVE mg/dL   Nitrite NEGATIVE NEGATIVE   Leukocytes,Ua NEGATIVE NEGATIVE   RBC / HPF 0-5 0 - 5 RBC/hpf   WBC, UA 0-5 0 - 5 WBC/hpf   Bacteria, UA FEW (A) NONE SEEN   Squamous Epithelial / LPF 6-10 0 - 5   Mucus PRESENT    Hyaline Casts, UA PRESENT     Comment: Performed at [facility], [address], [place], [place] [address]    Mr Lumbar Spine Wo Contrast  Result Date: 09/02/2018 CLINICAL DATA:  53 year old female with back pain. Recently treated with steroids. Previous spine surgery. EXAM: MRI LUMBAR SPINE WITHOUT CONTRAST TECHNIQUE: Multiplanar, multisequence MR imaging of the lumbar spine was performed. No intravenous contrast was administered. COMPARISON:  Lumbar radiographs 12/14/2017. Outside lumbar MRI 05/29/2014. FINDINGS: Segmentation:  Normal on the comparison radiographs. Alignment: Chronic  straightening of lumbar lordosis. Recommend follow-up with spine surgery. Vertebrae: Chronic interbody an interspinous implants at L2-L3 with mild hardware susceptibility. Degenerative appearing mild endplate and  right pedicle marrow edema at L3-L4 is new since 2016 (series 6, image 6). Normal background bone marrow signal. Intact visible sacrum and SI joints. Conus medullaris and cauda equina: Conus extends to the L1 level. No lower spinal cord or conus signal abnormality. Paraspinal and other soft tissues: Chronic subcutaneous edema in the posterior lumbar soft tissues. Mild superimposed postoperative changes to the posterior paraspinal soft tissues at L3-L4. negative visible abdominal viscera. Disc levels: T11-T12: Negative. T12-L1: Increased broad-based central or circumferential disc bulge. No significant stenosis. L1-L2: Chronic circumferential disc bulge and mild posterior element hypertrophy. No significant stenosis. L2-L3: Chronic interbody and interspinous implants. Chronic endplate spurring including a right paracentral component on series 8, image 12. Mild facet hypertrophy. Stable borderline to mild spinal and right L2 foraminal stenosis. L3-L4: Increased chronic circumferential disc bulge with broad-based posterior component. Progressed and moderate to severe posterior element hypertrophy. Increased and moderate to severe spinal and right lateral recess stenosis (series 8, image 17). Mild to moderate left lateral recess stenosis. Up to moderate new left and increased moderate right L3 foraminal stenosis. L4-L5: Sequelae of laminectomy since 2016. Chronic circumferential disc bulge with broad-based posterior component eccentric to the left. Increased residual posterior element hypertrophy, moderate to severe. Trace degenerative facet joint fluid. Moderate spinal stenosis has improved since 2016. Mild to moderate left greater than right lateral recess stenosis also has improved. Moderate bilateral L4 foraminal stenosis appears stable. L5-S1: Chronic circumferential disc bulge with broad-based central posterior component. Moderate posterior element hypertrophy. Stable mild bilateral lateral recess stenosis. No  significant spinal stenosis. Moderate left and mild right L5 foraminal stenosis is increased. IMPRESSION: 1. Chronic interbody and interspinous implants at L2-L3. 2. 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 3. Interval posterior decompression at L4-L5 with regressed but not resolved spinal, lateral recess and foraminal stenosis at that level. 4. Increased mild to moderate neural foraminal stenosis at L5-S1. Electronically Signed   By: Genevie Ann M.D.   On: 09/02/2018 18:55    Impression/Plan   53 y.o. female with lower back and right lower pain likely secondary to multilevel foraminal stenosis and L3-4 mod-severe spinal stenosis. She is neurologically intact on exam. Post residual void bladder scan <50cc. Pain significantly improved since being in ED. No need for acute NS intervention. Can be discharged from our perspective since pain is controlled with outpt follow up. Strong return precautions discussed.  Ferne Reus, PA-C [facility] [employer]

## 2018-09-02 NOTE — ED Notes (Signed)
40 mL in bladder

## 2018-09-02 NOTE — ED Provider Notes (Signed)
Emergency Department Provider Note   I have reviewed the triage vital signs and the nursing notes.   HISTORY  Chief Complaint Back Pain   HPI [name] [name] is a 53 y.o. female with PMH of hypertension, RLS, L2-3 fusion with Dr. Ronnald Ramp presents to the emergency department for evaluation of lower back pain radiating to the right leg.  Symptoms began several days ago.  She initially went to her primary care physician who gave IM steroid, flexeril, and NSAIDs.  Patient has been taking the above medications along with oral steroids but pain is worsening.  She denies any fevers or chills.  She has not had any recent instrumentation to the spine.  Pain is mostly in the right leg and severe.  She denies any bowel or bladder incontinence.  She states that a new symptom today is that she has had issues of urinary retention.  She tells me that prior to coming to the emergency department she sat on the toilet for approximately 10 minutes feeling that she needed to urinate but could not.  She is not experiencing any groin numbness.  No weakness or numbness in either leg.   Past Medical History:  Diagnosis Date  . Anxiety 01/12/2013  . Asthma   . Chicken pox   . Chronic insomnia 11/24/2016  . Clostridium difficile infection 04/02/2014, 02/06/2014  . Depression   . Dyslipidemia   . HTN (hypertension)   . Hypothyroidism   . Migraine   . Multiple allergies   . Obesity   . RLS (restless legs syndrome)     Patient Active Problem List   Diagnosis Date Noted  . B12 deficiency 01/31/2018  . Hormone replacement therapy (HRT) 07/20/2017  . Hair loss 07/20/2017  . Chronic insomnia 11/24/2016  . Elevated hemoglobin A1c 07/08/2016  . Hypertension 07/08/2016  . Arthralgia 07/08/2016  . Morbid obesity ([place]) 07/08/2016  . Wears glasses 07/08/2016  . Hypothyroidism   . Dyslipidemia   . Depression   . Mild memory disturbance 11/13/2013  . Restless legs syndrome (RLS) 01/12/2013    Past Surgical  History:  Procedure Laterality Date  . BREAST BIOPSY  [id number]   x 2 benign lesion  . CERVICAL DISCECTOMY  2003  . CERVICAL FUSION  2010   x2  . Hand fracture surgery    . LUMBAR FUSION  2016  . VAGINAL HYSTERECTOMY  2009    Allergies Bee venom, Biaxin [clarithromycin], Gabapentin, Abilify [aripiprazole], Buspar [buspirone], Lisinopril, Maxzide [triamterene-hctz], Phentermine, and Seroquel [quetiapine fumarate]  Family History  Problem Relation Age of Onset  . Hypertension Father   . Diabetes Father   . Heart disease Father   . Hearing loss Father   . Diabetes Brother   . Arthritis Mother   . Liver cancer Mother   . COPD Mother   . Mental illness Mother   . Diabetes Mother   . Hearing loss Mother   . Heart disease Mother     Social History Social History   Tobacco Use  . Smoking status: Never Smoker  . Smokeless tobacco: Never Used  Substance Use Topics  . Alcohol use: No    Alcohol/week: 0.0 standard drinks  . Drug use: No    Review of Systems  Constitutional: No fever/chills Eyes: No visual changes. ENT: No sore throat. Cardiovascular: Denies chest pain. Respiratory: Denies shortness of breath. Gastrointestinal: No abdominal pain.  No nausea, no vomiting.  No diarrhea.  No constipation. Genitourinary: Negative for dysuria. Positive urinary retention  symptoms.  Musculoskeletal: Positive for back pain. Skin: Negative for rash. Neurological: Negative for headaches, focal weakness or numbness.  10-point ROS otherwise negative.  ____________________________________________   PHYSICAL EXAM:  VITAL SIGNS: ED Triage Vitals  Enc Vitals Group     BP 09/02/18 1522 (!) 138/91     Pulse Rate 09/02/18 1522 96     Resp 09/02/18 1522 16     Temp 09/02/18 1522 98 F (36.7 C)     Temp Source 09/02/18 1522 Oral     SpO2 09/02/18 1522 95 %     Pain Score 09/02/18 1524 6   Constitutional: Alert and oriented. Well appearing and in no acute distress. Eyes:  Conjunctivae are normal. Head: Atraumatic. Nose: No congestion/rhinnorhea. Mouth/Throat: Mucous membranes are moist. Neck: No stridor.  Cardiovascular: Normal rate, regular rhythm. Good peripheral circulation. Grossly normal heart sounds.   Respiratory: Normal respiratory effort.  No retractions. Lungs CTAB. Gastrointestinal: Soft and nontender. No distention.  Musculoskeletal: No lower extremity tenderness nor edema. No gross deformities of extremities. Neurologic:  Normal speech and language. No gross focal neurologic deficits are appreciated. Normal strength and sensation in the B/L LEs.  Skin:  Skin is warm, dry and intact. No rash noted.   ____________________________________________   LABS (all labs ordered are listed, but only abnormal results are displayed)  Labs Reviewed  URINALYSIS, ROUTINE W REFLEX MICROSCOPIC - Abnormal; Notable for the following components:      Result Value   Color, Urine AMBER (*)    APPearance CLOUDY (*)    Specific Gravity, Urine 1.031 (*)    Protein, ur 30 (*)    Bacteria, UA FEW (*)    All other components within normal limits   ____________________________________________  RADIOLOGY  Mr Lumbar Spine Wo Contrast  Result Date: 09/02/2018 CLINICAL DATA:  53 year old female with back pain. Recently treated with steroids. Previous spine surgery. EXAM: MRI LUMBAR SPINE WITHOUT CONTRAST TECHNIQUE: Multiplanar, multisequence MR imaging of the lumbar spine was performed. No intravenous contrast was administered. COMPARISON:  Lumbar radiographs 12/14/2017. Outside lumbar MRI 05/29/2014. FINDINGS: Segmentation:  Normal on the comparison radiographs. Alignment: Chronic straightening of lumbar lordosis. Recommend follow-up with spine surgery. Vertebrae: Chronic interbody an interspinous implants at L2-L3 with mild hardware susceptibility. Degenerative appearing mild endplate and right pedicle marrow edema at L3-L4 is new since 2016 (series 6, image 6). Normal  background bone marrow signal. Intact visible sacrum and SI joints. Conus medullaris and cauda equina: Conus extends to the L1 level. No lower spinal cord or conus signal abnormality. Paraspinal and other soft tissues: Chronic subcutaneous edema in the posterior lumbar soft tissues. Mild superimposed postoperative changes to the posterior paraspinal soft tissues at L3-L4. negative visible abdominal viscera. Disc levels: T11-T12: Negative. T12-L1: Increased broad-based central or circumferential disc bulge. No significant stenosis. L1-L2: Chronic circumferential disc bulge and mild posterior element hypertrophy. No significant stenosis. L2-L3: Chronic interbody and interspinous implants. Chronic endplate spurring including a right paracentral component on series 8, image 12. Mild facet hypertrophy. Stable borderline to mild spinal and right L2 foraminal stenosis. L3-L4: Increased chronic circumferential disc bulge with broad-based posterior component. Progressed and moderate to severe posterior element hypertrophy. Increased and moderate to severe spinal and right lateral recess stenosis (series 8, image 17). Mild to moderate left lateral recess stenosis. Up to moderate new left and increased moderate right L3 foraminal stenosis. L4-L5: Sequelae of laminectomy since 2016. Chronic circumferential disc bulge with broad-based posterior component eccentric to the left. Increased residual posterior element  hypertrophy, moderate to severe. Trace degenerative facet joint fluid. Moderate spinal stenosis has improved since 2016. Mild to moderate left greater than right lateral recess stenosis also has improved. Moderate bilateral L4 foraminal stenosis appears stable. L5-S1: Chronic circumferential disc bulge with broad-based central posterior component. Moderate posterior element hypertrophy. Stable mild bilateral lateral recess stenosis. No significant spinal stenosis. Moderate left and mild right L5 foraminal stenosis is  increased. IMPRESSION: 1. Chronic interbody and interspinous implants at L2-L3. 2. 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 3. Interval posterior decompression at L4-L5 with regressed but not resolved spinal, lateral recess and foraminal stenosis at that level. 4. Increased mild to moderate neural foraminal stenosis at L5-S1. Electronically Signed   By: Genevie Ann M.D.   On: 09/02/2018 18:55    ____________________________________________   PROCEDURES  Procedure(s) performed:   Procedures  None ____________________________________________   INITIAL IMPRESSION / ASSESSMENT AND PLAN / ED COURSE  Pertinent labs & imaging results that were available during my care of the patient were reviewed by me and considered in my medical decision making (see chart for details).   Patient presents to the emergency department with lower back pain radiating to the right leg.  History seems most consistent with sciatica not responding well to outpatient management.  No focal neuro deficits.  Patient does have new onset urinary retention type symptoms.  Given this, plan for MRI lumbar spine to rule out cauda equina but lower suspicion overall for this diagnosis. UA and bladder scan also ordered and will follow.   MRI reviewed and discussed with NSG who has evaluated the patient. Pain well controlled at this time. No emergent intervention at this time. Plan for discharge home with Valium Rx and NSG follow up as an outpatient. No objective urinary retention. Discussed ED return precautions.  ____________________________________________  FINAL CLINICAL IMPRESSION(S) / ED DIAGNOSES  Final diagnoses:  Acute midline low back pain with right-sided sciatica     MEDICATIONS GIVEN DURING THIS VISIT:  Medications  diazepam (VALIUM) tablet 5 mg (5 mg  Oral Given 09/02/18 1836)  ketorolac (TORADOL) 30 MG/ML injection 30 mg (30 mg Intramuscular Given 09/02/18 1836)     NEW OUTPATIENT MEDICATIONS STARTED DURING THIS VISIT:  Discharge Medication List as of 09/02/2018  9:49 PM    START taking these medications   Details  diazepam (VALIUM) 5 MG tablet Take 1 tablet (5 mg total) by mouth every 8 (eight) hours as needed for muscle spasms., Starting Fri 09/02/2018, Normal        Note:  This document was prepared using Dragon voice recognition software and may include unintentional dictation errors.  Nanda Quinton, MD Emergency Medicine    , [facility], MD 09/03/18 [phone]

## 2018-09-02 NOTE — ED Triage Notes (Addendum)
Pt c/o lower back pain x 9 days. Denies fall/trauma. States pain is constant, denies urinary symptoms. Pain worse on standing, states it radiates across her lower back and through her spine. Pt seen by PCP, started on prednisone, flexeril and a steroid injection x 2 days ago with no relief.

## 2018-09-05 ENCOUNTER — Encounter: Payer: Self-pay | Admitting: Family Medicine

## 2018-09-05 NOTE — Telephone Encounter (Signed)
Pt went to ED and is asking for referral to Dr Mindi Curling. Please advise

## 2018-09-08 NOTE — Telephone Encounter (Signed)
Pt wrote the following my chart message: I have not received a call or any info on a referral to Dr. Mindi Curling. Is there an issue with getting this referral? Thanks, [name] [name]  I see where the patient called asking for a referral and was told to make appt. Pt had appt and went to ED. I do not see where a referral was placed. Please advise.

## 2018-09-09 ENCOUNTER — Ambulatory Visit (INDEPENDENT_AMBULATORY_CARE_PROVIDER_SITE_OTHER): Payer: BC Managed Care – PPO | Admitting: Family Medicine

## 2018-09-09 ENCOUNTER — Other Ambulatory Visit: Payer: Self-pay

## 2018-09-09 ENCOUNTER — Encounter: Payer: Self-pay | Admitting: Family Medicine

## 2018-09-09 VITALS — Ht 64.0 in

## 2018-09-09 DIAGNOSIS — M48061 Spinal stenosis, lumbar region without neurogenic claudication: Secondary | ICD-10-CM | POA: Insufficient documentation

## 2018-09-09 DIAGNOSIS — M5136 Other intervertebral disc degeneration, lumbar region: Secondary | ICD-10-CM | POA: Insufficient documentation

## 2018-09-09 DIAGNOSIS — Z981 Arthrodesis status: Secondary | ICD-10-CM

## 2018-09-09 DIAGNOSIS — M51369 Other intervertebral disc degeneration, lumbar region without mention of lumbar back pain or lower extremity pain: Secondary | ICD-10-CM

## 2018-09-09 DIAGNOSIS — M5416 Radiculopathy, lumbar region: Secondary | ICD-10-CM

## 2018-09-09 HISTORY — DX: Spinal stenosis, lumbar region without neurogenic claudication: M48.061

## 2018-09-09 HISTORY — DX: Other intervertebral disc degeneration, lumbar region without mention of lumbar back pain or lower extremity pain: M51.369

## 2018-09-09 HISTORY — DX: Radiculopathy, lumbar region: M54.16

## 2018-09-09 HISTORY — DX: Other intervertebral disc degeneration, lumbar region: M51.36

## 2018-09-09 HISTORY — DX: Arthrodesis status: Z98.1

## 2018-09-09 NOTE — Progress Notes (Signed)
VIRTUAL VISIT VIA VIDEO  I connected with [name] [name] on 09/09/18 at  4:00 PM EDT by a video enabled telemedicine application and verified that I am speaking with the correct person using two identifiers. Location patient: Home Location provider: [facility], Office Persons participating in the virtual visit: Patient, Dr. Raoul Pitch and R.Baker, LPN  I discussed the limitations of evaluation and management by telemedicine and the availability of in person appointments. The patient expressed understanding and agreed to proceed.   [name] [name] , 1965/11/30, 53 y.o., female MRN: [id number] Patient Care Team    Relationship Specialty Notifications Start End  [name], DO PCP - General Family Medicine  07/07/16   Ob/Gyn, [name]    07/08/16   Wilford Corner, MD Consulting Physician Gastroenterology  07/08/16   [name], MD Consulting Physician Neurology  07/08/16   [name], MD Attending Physician Endocrinology  07/08/16    Comment: Pt established with Si Raider, NP at this location. - Integrative med- thyroid    Chief Complaint  Patient presents with   Follow-up    Pt took last prednisone today. Feeling okay but still has pain. Would like referral to Dr Mindi Curling      Subjective:  [name] [name] is a 52 y.o. female presents today for follow-up on her low back pain that started approximately June 23.  Patient has a history of cervical and lumbar fusion in the past.  She was seen in the office on August 31, 2018 and provided with muscle relaxants and steroids.  She was then seen in the emergency room 2 days later for worsening symptoms.  She states she felt like she could not walk.  ED completed MRI and had her follow-up as outpatient.  MRI resulted with progressive adjacent segment disease at L3-L4 with degenerative appearing marrow edema and multifactorial moderate to severe spinal, right lateral recess, and moderate bilateral foraminal stenosis.   Interval posterior decompression at L4-L5 with regressed but not resolved spinal, lateral recess and foraminal stenosis at that level.  Increased mild to moderate normal foraminal stenosis at L5-S1.  She reports since completing her steroid taper she has had at least some improvement in her symptoms.  She still feels that she is in pain.  She is unable to increase her activity without worsening her symptoms.  She states she is having to remain rather sedentary or her pain will return.  She denies bladder or bowel dysfunction.  Prior note 08/31/2018: Pt presents for an OV with complaints of back pain  of 8 days duration.  Associated symptoms include pain in her right lower back that radiates to her buttocks.  States it hurts to stand, walk or lay down.  She denies any injury.  She has a history of lumbar fusion.  She denies any increase in activity prior to onset of discomfort.  She has been trying to rest and thought it was getting better until yesterday it started to make a turn for the worst. Pt has tried mobic to ease their symptoms.   Mr Lumbar Spine Wo Contrast  Result Date: 09/02/2018 CLINICAL DATA:  53 year old female with back pain. Recently treated with steroids. Previous spine surgery. EXAM: MRI LUMBAR SPINE WITHOUT CONTRAST TECHNIQUE: Multiplanar, multisequence MR imaging of the lumbar spine was performed. No intravenous contrast was administered. COMPARISON:  Lumbar radiographs 12/14/2017. Outside lumbar MRI 05/29/2014. FINDINGS: Segmentation:  Normal on the comparison radiographs. Alignment: Chronic straightening of lumbar  lordosis. Recommend follow-up with spine surgery. Vertebrae: Chronic interbody an interspinous implants at L2-L3 with mild hardware susceptibility. Degenerative appearing mild endplate and right pedicle marrow edema at L3-L4 is new since 2016 (series 6, image 6). Normal background bone marrow signal. Intact visible sacrum and SI joints. Conus medullaris and cauda equina: Conus  extends to the L1 level. No lower spinal cord or conus signal abnormality. Paraspinal and other soft tissues: Chronic subcutaneous edema in the posterior lumbar soft tissues. Mild superimposed postoperative changes to the posterior paraspinal soft tissues at L3-L4. negative visible abdominal viscera. Disc levels: T11-T12: Negative. T12-L1: Increased broad-based central or circumferential disc bulge. No significant stenosis. L1-L2: Chronic circumferential disc bulge and mild posterior element hypertrophy. No significant stenosis. L2-L3: Chronic interbody and interspinous implants. Chronic endplate spurring including a right paracentral component on series 8, image 12. Mild facet hypertrophy. Stable borderline to mild spinal and right L2 foraminal stenosis. L3-L4: Increased chronic circumferential disc bulge with broad-based posterior component. Progressed and moderate to severe posterior element hypertrophy. Increased and moderate to severe spinal and right lateral recess stenosis (series 8, image 17). Mild to moderate left lateral recess stenosis. Up to moderate new left and increased moderate right L3 foraminal stenosis. L4-L5: Sequelae of laminectomy since 2016. Chronic circumferential disc bulge with broad-based posterior component eccentric to the left. Increased residual posterior element hypertrophy, moderate to severe. Trace degenerative facet joint fluid. Moderate spinal stenosis has improved since 2016. Mild to moderate left greater than right lateral recess stenosis also has improved. Moderate bilateral L4 foraminal stenosis appears stable. L5-S1: Chronic circumferential disc bulge with broad-based central posterior component. Moderate posterior element hypertrophy. Stable mild bilateral lateral recess stenosis. No significant spinal stenosis. Moderate left and mild right L5 foraminal stenosis is increased. IMPRESSION: 1. Chronic interbody and interspinous implants at L2-L3. 2. Progressed adjacent segment  disease at L3-L4 since 2016 with degenerative appearing marrow edema and multifactorial moderate to severe spinal, right lateral recess, and moderate bilateral foraminal stenosis. 3. Interval posterior decompression at L4-L5 with regressed but not resolved spinal, lateral recess and foraminal stenosis at that level. 4. Increased mild to moderate neural foraminal stenosis at L5-S1. Electronically Signed   By: Genevie Ann M.D.   On: 09/02/2018 18:55     Depression screen [facility] 2/9 07/27/2018 10/08/2017 05/03/2017 01/06/2017 07/07/2016  Decreased Interest 0 0 0 0 0  Down, Depressed, Hopeless 0 0 0 0 0  PHQ - 2 Score 0 0 0 0 0  Altered sleeping 3 3 - 3 -  Tired, decreased energy 3 3 - 0 -  Change in appetite 3 1 - 0 -  Feeling bad or failure about yourself  0 0 - 0 -  Trouble concentrating 0 - - 0 -  Moving slowly or fidgety/restless 0 0 - 0 -  Suicidal thoughts 0 0 - 0 -  PHQ-9 Score 9 7 - 3 -  Difficult doing work/chores Not difficult at all Somewhat difficult - Not difficult at all -    Allergies  Allergen Reactions   Bee Venom Swelling and Anaphylaxis   Biaxin [Clarithromycin] Diarrhea and Rash   Gabapentin Nausea And Vomiting    No reaction to Horizant   Abilify [Aripiprazole] Other (See Comments)    MOOD SWING   Buspar [Buspirone] Diarrhea   Lisinopril Cough   Maxzide [Triamterene-Hctz] Rash   Phentermine Other (See Comments)    MOOD CHANGE    Seroquel [Quetiapine Fumarate] Other (See Comments)    MOOD SWINGS  Social History   Social History Narrative   Patient is married [name])  2 children [name] and Rose Bud)   Patient is right handed.   Patient has a college education. 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    Patient drinks 1 cups daily. Uses herbal remedies, takes a daily vitamin.   Wears her seatbelt, smoke detector in the home.   Past Medical History:  Diagnosis Date   Anxiety 01/12/2013   Asthma    Chicken pox    Chronic insomnia 11/24/2016   Clostridium difficile  infection 04/02/2014, 02/06/2014   Depression    Dyslipidemia    HTN (hypertension)    Hypothyroidism    Migraine    Multiple allergies    Obesity    RLS (restless legs syndrome)    Past Surgical History:  Procedure Laterality Date   BREAST BIOPSY  6606,3016   x 2 benign lesion   CERVICAL DISCECTOMY  2003   CERVICAL FUSION  2010   x2   Hand fracture surgery     LUMBAR FUSION  2016   VAGINAL HYSTERECTOMY  2009   Family History  Problem Relation Age of Onset   Hypertension Father    Diabetes Father    Heart disease Father    Hearing loss Father    Diabetes Brother    Arthritis Mother    Liver cancer Mother    COPD Mother    Mental illness Mother    Diabetes Mother    Hearing loss Mother    Heart disease Mother    Allergies as of 09/09/2018      Reactions   Bee Venom Swelling, Anaphylaxis   Biaxin [clarithromycin] Diarrhea, Rash   Gabapentin Nausea And Vomiting   No reaction to Horizant   Abilify [aripiprazole] Other (See Comments)   MOOD SWING   Buspar [buspirone] Diarrhea   Lisinopril Cough   Maxzide [triamterene-hctz] Rash   Phentermine Other (See Comments)   MOOD CHANGE   Seroquel [quetiapine Fumarate] Other (See Comments)   MOOD SWINGS      Medication List       Accurate as of September 09, 2018  2:59 PM. If you have any questions, ask your nurse or doctor.        acetaminophen 500 MG tablet Commonly known as: TYLENOL Take 1,000 mg by mouth every 6 (six) hours as needed for headache (pain).   buPROPion 300 MG 24 hr tablet Commonly known as: WELLBUTRIN XL Take 1 tablet (300 mg total) by mouth daily. What changed: when to take this   cholecalciferol 1000 units tablet Commonly known as: VITAMIN D Take 2,000 Units by mouth daily.   cyclobenzaprine 5 MG tablet Commonly known as: FLEXERIL Take 1 tablet (5 mg total) by mouth 3 (three) times daily as needed for muscle spasms.   diazepam 5 MG tablet Commonly known as:  VALIUM Take 1 tablet (5 mg total) by mouth every 8 (eight) hours as needed for muscle spasms.   DULoxetine 60 MG capsule Commonly known as: CYMBALTA Take 1 capsule (60 mg total) by mouth daily. Needs office visit prior to anymore refills. What changed: when to take this   estradiol 2 MG tablet Commonly known as: ESTRACE Take 1 tablet (2 mg total) by mouth daily.   FISH OIL PO Take 1 capsule by mouth daily.   Horizant 300 MG Tbcr Generic drug: Gabapentin Enacarbil ER TAKE 2 TABLETS BY MOUTH EVERY DAY AT 5PM WITH FOOD What changed: See the new instructions.  levothyroxine 150 MCG tablet Commonly known as: SYNTHROID Take 1 tablet (150 mcg total) by mouth daily. 8 tablets weekly What changed:   when to take this  additional instructions   meloxicam 15 MG tablet Commonly known as: MOBIC Take 1 tablet (15 mg total) by mouth daily.   metFORMIN 500 MG tablet Commonly known as: GLUCOPHAGE Take 1 tablet (500 mg total) by mouth 2 (two) times daily with a meal.   predniSONE 20 MG tablet Commonly known as: DELTASONE 40 mg x3d, 20 mg x3d, 10 mg x2d What changed:   how much to take  how to take this  when to take this  additional instructions   progesterone 100 MG capsule Commonly known as: PROMETRIUM Take 1 capsule (100 mg total) by mouth every other day.   rOPINIRole 2 MG tablet Commonly known as: REQUIP Take 1/2 tablet at lunch and 1/2 tablet at bed time. What changed:   how much to take  how to take this  when to take this  additional instructions   rOPINIRole 4 MG 24 hr tablet Commonly known as: REQUIP XL Take 1 tablet (4 mg total) by mouth daily. What changed: Another medication with the same name was changed. Make sure you understand how and when to take each.   telmisartan-hydrochlorothiazide 80-25 MG tablet Commonly known as: MICARDIS HCT Take 1 tablet by mouth daily. What changed: when to take this       All past medical history, surgical  history, allergies, family history, immunizations andmedications were updated in the EMR today and reviewed under the history and medication portions of their EMR.     ROS: Negative, with the exception of above mentioned in HPI   Objective:  Ht 5\' 4"  (1.626 m)    BMI 43.64 kg/m  Body mass index is 43.64 kg/m.  Gen: Afebrile. No acute distress.  Still appears uncomfortable sitting at her computer. Chest: No cough or shortness of breath. MSK: Guarded position. Neuro: Alert. Oriented.   No exam data present No results found. No results found for this or any previous visit (from the past 24 hour(s)).  Assessment/Plan: [name] [name] is a 53 y.o. female present for OV for   DDD (degenerative disc disease), lumbar/ Foraminal stenosis of lumbar region/History of lumbar fusion/Lumbar radiculopathy -Discussed MRI results with her today and agree she should be followed by neurosurgery to further evaluate and discuss options.  She is finally seeing some improvement in her symptoms after completing the steroid taper. -Continue Flexeril as needed -Patient declines pain medication at this time.  Continue NSAIDs as needed. - Ambulatory referral to Neurosurgery    Reviewed expectations re: course of current medical issues.  Discussed self-management of symptoms.  Outlined signs and symptoms indicating need for more acute intervention.  Patient verbalized understanding and all questions were answered.  Patient received an After-Visit Summary.    No orders of the defined types were placed in this encounter.  > 15 minutes spent with patient, > 50% of that time face to face     Note is dictated utilizing voice recognition software. Although note has been proof read prior to signing, occasional typographical errors still can be missed. If any questions arise, please do not hesitate to call for verification.   electronically signed by:  Howard Pouch, DO  [place]

## 2018-09-13 DIAGNOSIS — M5136 Other intervertebral disc degeneration, lumbar region: Secondary | ICD-10-CM | POA: Diagnosis not present

## 2018-09-13 DIAGNOSIS — M961 Postlaminectomy syndrome, not elsewhere classified: Secondary | ICD-10-CM | POA: Diagnosis not present

## 2018-09-13 DIAGNOSIS — M48061 Spinal stenosis, lumbar region without neurogenic claudication: Secondary | ICD-10-CM | POA: Diagnosis not present

## 2018-09-13 DIAGNOSIS — M47816 Spondylosis without myelopathy or radiculopathy, lumbar region: Secondary | ICD-10-CM | POA: Diagnosis not present

## 2018-09-14 ENCOUNTER — Ambulatory Visit
Admission: RE | Admit: 2018-09-14 | Discharge: 2018-09-14 | Disposition: A | Discharge status: Home / Self Care | Payer: BC Managed Care – PPO | Source: Ambulatory Visit | Attending: Family Medicine | Admitting: Family Medicine

## 2018-09-14 ENCOUNTER — Other Ambulatory Visit: Payer: Self-pay

## 2018-09-14 DIAGNOSIS — Z79890 Hormone replacement therapy (postmenopausal): Secondary | ICD-10-CM

## 2018-09-14 DIAGNOSIS — Z1239 Encounter for other screening for malignant neoplasm of breast: Secondary | ICD-10-CM

## 2018-09-14 DIAGNOSIS — Z1231 Encounter for screening mammogram for malignant neoplasm of breast: Secondary | ICD-10-CM | POA: Diagnosis not present

## 2018-09-15 ENCOUNTER — Other Ambulatory Visit: Payer: Self-pay | Admitting: Family Medicine

## 2018-09-15 DIAGNOSIS — M48061 Spinal stenosis, lumbar region without neurogenic claudication: Secondary | ICD-10-CM | POA: Diagnosis not present

## 2018-09-15 DIAGNOSIS — M961 Postlaminectomy syndrome, not elsewhere classified: Secondary | ICD-10-CM | POA: Diagnosis not present

## 2018-09-15 DIAGNOSIS — M47816 Spondylosis without myelopathy or radiculopathy, lumbar region: Secondary | ICD-10-CM | POA: Diagnosis not present

## 2018-09-15 DIAGNOSIS — M5136 Other intervertebral disc degeneration, lumbar region: Secondary | ICD-10-CM | POA: Diagnosis not present

## 2018-09-22 ENCOUNTER — Telehealth: Payer: Self-pay

## 2018-09-22 NOTE — Telephone Encounter (Signed)
We received a prior authorization request for Requip XL 4mg . I have completed and submitted the PA on Cover My Meds and should have a determination within 48-72 hours.  Cover My Meds Key: [id number]

## 2018-09-27 DIAGNOSIS — M47816 Spondylosis without myelopathy or radiculopathy, lumbar region: Secondary | ICD-10-CM | POA: Diagnosis not present

## 2018-10-06 ENCOUNTER — Telehealth: Payer: Self-pay | Admitting: Internal Medicine

## 2018-10-06 ENCOUNTER — Other Ambulatory Visit: Payer: Self-pay

## 2018-10-06 NOTE — Telephone Encounter (Signed)
Spoke to pt and informed pt to keep f/u appt

## 2018-10-06 NOTE — Telephone Encounter (Signed)
Patient states she had blood work at Dr. Lucita Lora office and patient would like to know if Dr. Kelton Pillar still wants to see patient on Monday 10/10/18 after reviewing labs. Please call patient at ph# [phone] to advise.

## 2018-10-06 NOTE — Telephone Encounter (Addendum)
Received approval for Requip XL 4mg . 09-22-18 thru 09-20-21.  Ref # [id number].  [employer].  [phone].  Fax confirmation to [facility] received [phone].

## 2018-10-06 NOTE — Telephone Encounter (Signed)
Please advise 

## 2018-10-10 ENCOUNTER — Ambulatory Visit (INDEPENDENT_AMBULATORY_CARE_PROVIDER_SITE_OTHER): Payer: BC Managed Care – PPO | Admitting: Internal Medicine

## 2018-10-10 ENCOUNTER — Other Ambulatory Visit: Payer: Self-pay

## 2018-10-10 ENCOUNTER — Encounter: Payer: Self-pay | Admitting: Internal Medicine

## 2018-10-10 VITALS — BP 118/78 | HR 79 | Temp 98.3°F | Ht 64.0 in | Wt 249.8 lb

## 2018-10-10 DIAGNOSIS — E039 Hypothyroidism, unspecified: Secondary | ICD-10-CM | POA: Diagnosis not present

## 2018-10-10 LAB — T4, FREE: Free T4: 1.51 ng/dL (ref 0.60–1.60)

## 2018-10-10 LAB — TSH: TSH: 3.36 u[IU]/mL (ref 0.35–4.50)

## 2018-10-10 MED ORDER — LEVOTHYROXINE SODIUM 150 MCG PO TABS: 150.0000 ug | ORAL_TABLET | ORAL | 2 refills | Status: DC

## 2018-10-10 NOTE — Patient Instructions (Signed)

## 2018-10-10 NOTE — Progress Notes (Signed)
Name: [name] [name]  MRN/ DOB: [id number], [birth year]    Age/ Sex: 53 y.o., female     PCP: [name], DO   Reason for Endocrinology Evaluation: Hypothyroidism     Initial Endocrinology Clinic Visit: 04/14/2018     PATIENT IDENTIFIER: [name] is a 53 y.o., female with a past medical history of HTN, RLS, Hypothyroidism, depression and dyslipidemia. She has followed with [facility] since 04/14/2018  for consultative assistance with management of her hypothyroidism   HISTORICAL SUMMARY: The patient was first diagnosed with hypothyroidism  >10 yrs ago. She was on Levothyroxine initially but switched to Nature-Throid due to difficult to control TFTs. She has been on Nature-throid for ~ 4 yrs prior to her presentation to our clinic and continues to have difficulty steadying her TSH. She was also on Cytomel for 3 months prior to her presentation without any improvement her symptoms of fatigue , hair loss or excessive sweating.   She was switched from nature-throid to Levothyroxine 04/2018  SUBJECTIVE:   During last visit (06/03/2018): We continued Levothyroxine 150 mcg daily     Today (10/10/2018):  Ms. [name] is here for 4 month follow on hypothyroidism. She is taking 2 levothyroxine on Thursday and one tablet rest of the week Energy level is improving, no hair loss no diarrhea or constipation   She gets anxious a lot.    She is not on biotin     ROS:  As per HPI.   HISTORY:  Past Medical History:  Past Medical History:  Diagnosis Date  . Anxiety 01/12/2013  . Asthma   . Chicken pox   . Chronic insomnia 11/24/2016  . Clostridium difficile infection 04/02/2014, 02/06/2014  . Depression   . Dyslipidemia   . HTN (hypertension)   . Hypothyroidism   . Migraine   . Multiple allergies   . Obesity   . RLS (restless legs syndrome)    Past Surgical History:  Past Surgical History:  Procedure Laterality Date  . BREAST BIOPSY  2952,8413   x  2 benign lesion  . CERVICAL DISCECTOMY  2003  . CERVICAL FUSION  2010   x2  . Hand fracture surgery    . LUMBAR FUSION  2016  . VAGINAL HYSTERECTOMY  2009    Social History:  reports that she has never smoked. She has never used smokeless tobacco. She reports that she does not drink alcohol or use drugs. Family History:  Family History  Problem Relation Age of Onset  . Hypertension Father   . Diabetes Father   . Heart disease Father   . Hearing loss Father   . Diabetes Brother   . Arthritis Mother   . Liver cancer Mother   . COPD Mother   . Mental illness Mother   . Diabetes Mother   . Hearing loss Mother   . Heart disease Mother      HOME MEDICATIONS: Allergies as of 10/10/2018      Reactions   Bee Venom Swelling, Anaphylaxis   Biaxin [clarithromycin] Diarrhea, Rash   Gabapentin Nausea And Vomiting   No reaction to Horizant   Abilify [aripiprazole] Other (See Comments)   [name] SWING   Buspar [buspirone] Diarrhea   Lisinopril Cough   Maxzide [triamterene-hctz] Rash   Phentermine Other (See Comments)   [name] CHANGE   Seroquel [quetiapine Fumarate] Other (See Comments)   [name] SWINGS      Medication List  Accurate as of October 10, 2018  8:07 AM. If you have any questions, ask your nurse or doctor.        acetaminophen 500 MG tablet Commonly known as: TYLENOL Take 1,000 mg by mouth every 6 (six) hours as needed for headache (pain).   buPROPion 300 MG 24 hr tablet Commonly known as: WELLBUTRIN XL Take 1 tablet (300 mg total) by mouth daily. What changed: when to take this   cholecalciferol 1000 units tablet Commonly known as: VITAMIN D Take 2,000 Units by mouth daily.   cyclobenzaprine 5 MG tablet Commonly known as: FLEXERIL Take 1 tablet (5 mg total) by mouth 3 (three) times daily as needed for muscle spasms.   diazepam 5 MG tablet Commonly known as: VALIUM Take 1 tablet (5 mg total) by mouth every 8 (eight) hours as needed for muscle spasms.    DULoxetine 60 MG capsule Commonly known as: CYMBALTA Take 1 capsule (60 mg total) by mouth daily. Needs office visit prior to anymore refills. What changed: when to take this   estradiol 2 MG tablet Commonly known as: ESTRACE Take 1 tablet (2 mg total) by mouth daily.   FISH OIL PO Take 1 capsule by mouth daily.   Horizant 300 MG Tbcr Generic drug: Gabapentin Enacarbil ER TAKE 2 TABLETS BY MOUTH EVERY DAY AT 5PM WITH FOOD What changed: See the new instructions.   levothyroxine 150 MCG tablet Commonly known as: SYNTHROID Take 1 tablet (150 mcg total) by mouth daily. 8 tablets weekly What changed:   when to take this  additional instructions   meloxicam 15 MG tablet Commonly known as: MOBIC Take 1 tablet (15 mg total) by mouth daily.   metFORMIN 500 MG tablet Commonly known as: GLUCOPHAGE Take 1 tablet (500 mg total) by mouth 2 (two) times daily with a meal.   predniSONE 20 MG tablet Commonly known as: DELTASONE 40 mg x3d, 20 mg x3d, 10 mg x2d What changed:   how much to take  how to take this  when to take this  additional instructions   progesterone 100 MG capsule Commonly known as: PROMETRIUM Take 1 capsule (100 mg total) by mouth every other day.   rOPINIRole 2 MG tablet Commonly known as: REQUIP Take 1/2 tablet at lunch and 1/2 tablet at bed time. What changed:   how much to take  how to take this  when to take this  additional instructions   rOPINIRole 4 MG 24 hr tablet Commonly known as: REQUIP XL Take 1 tablet (4 mg total) by mouth daily. What changed: Another medication with the same name was changed. Make sure you understand how and when to take each.   telmisartan-hydrochlorothiazide 80-25 MG tablet Commonly known as: MICARDIS HCT Take 1 tablet by mouth daily. What changed: when to take this          DATA REVIEWED: Results for MASSIE, COGLIANO (MRN [id number]) as of 10/10/2018 08:09  Ref. Range 07/20/2017 13:32 10/08/2017 10:09  01/25/2018 14:52 03/29/2018 14:02 08/02/2018 09:02  TSH Latest Ref Range: 0.35 - 4.50 uIU/mL 5.93 (H) 6.03 (H) 5.80 (H) 6.82 (H) 5.35 (H)  T4,Free(Direct) Latest Ref Range: 0.60 - 1.60 ng/dL 0.86 0.82 1.0  1.11       ASSESSMENT / PLAN / RECOMMENDATIONS:   1. Hypothyroidism  - Pt is clinically euthyroid  - Pt educated extensively on the correct way to take levothyroxine (first thing in the morning with water, 30 minutes before eating or taking other medications). -  Pt encouraged to double dose the following day if she were to miss a dose given long half-life of levothyroxine.  Medications   Levothyroxine 150 mcg , take 2 tablets on Thursday and one tablet rest of the week      F/U in 6 months    Signed electronically by: Mack Guise, MD  [facility]  [facility] [address] [place], [place] [address] Phone: [phone] FAX: [phone]   CC: [name], DO [address] [place] [address] Phone: [phone]  Fax: [phone]  Return to Endocrinology clinic as below: Future Appointments  Date Time Provider [place]  10/10/2018  8:10 AM Shamleffer, Melanie Crazier, MD [facility] None  12/06/2018  9:00 AM [name], DO [facility]  08/15/2019  3:45 PM Suzzanne Cloud, NP [facility] None

## 2018-10-11 DIAGNOSIS — M47816 Spondylosis without myelopathy or radiculopathy, lumbar region: Secondary | ICD-10-CM | POA: Diagnosis not present

## 2018-10-17 DIAGNOSIS — Z20828 Contact with and (suspected) exposure to other viral communicable diseases: Secondary | ICD-10-CM | POA: Diagnosis not present

## 2018-10-25 ENCOUNTER — Other Ambulatory Visit: Payer: Self-pay | Admitting: Internal Medicine

## 2018-11-08 ENCOUNTER — Ambulatory Visit (INDEPENDENT_AMBULATORY_CARE_PROVIDER_SITE_OTHER): Payer: BC Managed Care – PPO | Admitting: Family

## 2018-11-08 ENCOUNTER — Ambulatory Visit (HOSPITAL_BASED_OUTPATIENT_CLINIC_OR_DEPARTMENT_OTHER)
Admission: RE | Admit: 2018-11-08 | Discharge: 2018-11-08 | Disposition: A | Discharge status: Home / Self Care | Payer: BC Managed Care – PPO | Source: Ambulatory Visit | Attending: Family | Admitting: Family

## 2018-11-08 ENCOUNTER — Other Ambulatory Visit: Payer: Self-pay

## 2018-11-08 VITALS — BP 133/66 | HR 88 | Temp 96.8°F | Resp 16 | Ht 64.0 in | Wt 254.0 lb

## 2018-11-08 DIAGNOSIS — M25562 Pain in left knee: Secondary | ICD-10-CM | POA: Diagnosis not present

## 2018-11-08 DIAGNOSIS — M1712 Unilateral primary osteoarthritis, left knee: Secondary | ICD-10-CM | POA: Diagnosis not present

## 2018-11-08 NOTE — Patient Instructions (Addendum)
Please complete x-ray on the first floor.  You should be contacted about your referral to sports medicine.  

## 2018-11-08 NOTE — Progress Notes (Signed)
Subjective:    Patient ID: [name] [name], female    DOB: Dec 13, 1965, 53 y.o.   MRN: [id number]  HPI  Patient is a 53 yr old female who presents today with chief complaint if left sided knee pain. Reports that she developed left sided knee pain which occurred 11 days ago.  Reports no known injury. Reports she could not walk due to the pain.  Note some improvement. Using tylenol which is no longer helping.    Review of Systems    see HPI  Past Medical History:  Diagnosis Date  . Anxiety 01/12/2013  . Asthma   . Chicken pox   . Chronic insomnia 11/24/2016  . Clostridium difficile infection 04/02/2014, 02/06/2014  . Depression   . Dyslipidemia   . HTN (hypertension)   . Hypothyroidism   . Migraine   . Multiple allergies   . Obesity   . RLS (restless legs syndrome)      Social History   Socioeconomic History  . Marital status: Married    Spouse name: [name]  . Number of children: 2  . Years of education: COLLEGE  . Highest education level: Not on file  Occupational History  . Occupation: OWNER    Employer: [employer]  Social Needs  . Financial resource strain: Not on file  . Food insecurity    Worry: Not on file    Inability: Not on file  . Transportation needs    Medical: Not on file    Non-medical: Not on file  Tobacco Use  . Smoking status: Never Smoker  . Smokeless tobacco: Never Used  Substance and Sexual Activity  . Alcohol use: No    Alcohol/week: 0.0 standard drinks  . Drug use: No  . Sexual activity: Yes    Partners: Male    Birth control/protection: None  Lifestyle  . Physical activity    Days per week: Not on file    Minutes per session: Not on file  . Stress: Not on file  Relationships  . Social Herbalist on phone: Not on file    Gets together: Not on file    Attends religious service: Not on file    Active member of club or organization: Not on file    Attends meetings of clubs or organizations: Not on file    Relationship  status: Not on file  . Intimate partner violence    Fear of current or ex partner: Not on file    Emotionally abused: Not on file    Physically abused: Not on file    Forced sexual activity: Not on file  Other Topics Concern  . Not on file  Social History Narrative   Patient is married [name])  2 children [name] and [place])   Patient is right handed.   Patient has a college education. 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    Patient drinks 1 cups daily. Uses herbal remedies, takes a daily vitamin.   Wears her seatbelt, smoke detector in the home.    Past Surgical History:  Procedure Laterality Date  . BREAST BIOPSY  [id number]   x 2 benign lesion  . CERVICAL DISCECTOMY  2003  . CERVICAL FUSION  2010   x2  . Hand fracture surgery    . LUMBAR FUSION  2016  . VAGINAL HYSTERECTOMY  2009    Family History  Problem Relation Age of Onset  . Hypertension Father   . Diabetes Father   . Heart  disease Father   . Hearing loss Father   . Diabetes Brother   . Arthritis Mother   . Liver cancer Mother   . COPD Mother   . Mental illness Mother   . Diabetes Mother   . Hearing loss Mother   . Heart disease Mother     Allergies  Allergen Reactions  . Bee Venom Swelling and Anaphylaxis  . Biaxin [Clarithromycin] Diarrhea and Rash  . Gabapentin Nausea And Vomiting    No reaction to Horizant  . Abilify [Aripiprazole] Other (See Comments)    MOOD SWING  . Buspar [Buspirone] Diarrhea  . Lisinopril Cough  . Maxzide [Triamterene-Hctz] Rash  . Phentermine Other (See Comments)    MOOD CHANGE   . Seroquel [Quetiapine Fumarate] Other (See Comments)    MOOD SWINGS    Current Outpatient Medications on File Prior to Visit  Medication Sig Dispense Refill  . buPROPion (WELLBUTRIN XL) 300 MG 24 hr tablet Take 1 tablet (300 mg total) by mouth daily. (Patient taking differently: Take 300 mg by mouth at bedtime. ) 90 tablet 1  . cholecalciferol (VITAMIN D) 1000 units tablet Take 2,000 Units by mouth  daily.     . cyclobenzaprine (FLEXERIL) 5 MG tablet Take 1 tablet (5 mg total) by mouth 3 (three) times daily as needed for muscle spasms. 30 tablet 1  . diazepam (VALIUM) 5 MG tablet Take 1 tablet (5 mg total) by mouth every 8 (eight) hours as needed for muscle spasms. 10 tablet 0  . DULoxetine (CYMBALTA) 60 MG capsule Take 1 capsule (60 mg total) by mouth daily. Needs office visit prior to anymore refills. (Patient taking differently: Take 60 mg by mouth at bedtime. Needs office visit prior to anymore refills.) 90 capsule 1  . estradiol (ESTRACE) 2 MG tablet Take 1 tablet (2 mg total) by mouth daily. 90 tablet 2  . HORIZANT 300 MG TBCR TAKE 2 TABLETS BY MOUTH EVERY DAY AT 5PM WITH FOOD (Patient taking differently: Take 300 mg by mouth 2 (two) times a day. ) 60 tablet 6  . levothyroxine (SYNTHROID) 150 MCG tablet TAKE 1 TABLET BY MOUTH EVERY DAY 90 tablet 1  . meloxicam (MOBIC) 15 MG tablet Take 1 tablet (15 mg total) by mouth daily. 90 tablet 1  . metFORMIN (GLUCOPHAGE) 500 MG tablet Take 1 tablet (500 mg total) by mouth 2 (two) times daily with a meal. 180 tablet 1  . Omega-3 Fatty Acids (FISH OIL PO) Take 1 capsule by mouth daily.    . progesterone (PROMETRIUM) 100 MG capsule Take 1 capsule (100 mg total) by mouth every other day. 45 capsule 3  . rOPINIRole (REQUIP XL) 4 MG 24 hr tablet Take 1 tablet (4 mg total) by mouth daily. 90 tablet 3  . rOPINIRole (REQUIP) 2 MG tablet Take 1/2 tablet at lunch and 1/2 tablet at bed time. (Patient taking differently: Take 2 mg by mouth at bedtime. ) 90 tablet 3  . telmisartan-hydrochlorothiazide (MICARDIS HCT) 80-25 MG tablet Take 1 tablet by mouth daily. (Patient taking differently: Take 1 tablet by mouth at bedtime. ) 90 tablet 1   No current facility-administered medications on file prior to visit.     BP 133/66 (BP Location: Right Arm, Cuff Size: Large)   Pulse 88   Temp (!) 96.8 F (36 C) (Temporal)   Resp 16   Ht 5\' 4"  (1.626 m)   Wt 254 lb  (115.2 kg)   SpO2 96%   BMI 43.60 kg/m  Objective:   Physical Exam Constitutional:      Appearance: She is well-developed.  Neck:     Musculoskeletal: Neck supple.     Thyroid: No thyromegaly.  Cardiovascular:     Rate and Rhythm: Normal rate and regular rhythm.     Heart sounds: Normal heart sounds. No murmur.  Pulmonary:     Effort: Pulmonary effort is normal. No respiratory distress.     Breath sounds: Normal breath sounds. No wheezing.  Skin:    General: Skin is warm and dry.  Neurological:     Mental Status: She is alert and oriented to person, place, and time.  Psychiatric:        Behavior: Behavior normal.        Thought Content: Thought content normal.        Judgment: Judgment normal.   Musculoskeletal: mild swelling of left knee.  No tenderness to palpation.  + increased pain with extreme flexion and extension of left knee        Assessment & Plan:  L Knee pain- Xray notes degenerative changes. Continue meloxicam, refer to sports medicine. Discussed importance of weight loss to help knee pain.

## 2018-11-09 ENCOUNTER — Encounter: Payer: Self-pay | Admitting: Family

## 2018-11-10 ENCOUNTER — Ambulatory Visit: Payer: Self-pay

## 2018-11-10 ENCOUNTER — Ambulatory Visit (INDEPENDENT_AMBULATORY_CARE_PROVIDER_SITE_OTHER): Payer: BC Managed Care – PPO | Admitting: Family Medicine

## 2018-11-10 ENCOUNTER — Other Ambulatory Visit: Payer: Self-pay

## 2018-11-10 ENCOUNTER — Encounter: Payer: Self-pay | Admitting: Family Medicine

## 2018-11-10 VITALS — BP 115/71 | Ht 64.0 in | Wt 250.0 lb

## 2018-11-10 DIAGNOSIS — M1712 Unilateral primary osteoarthritis, left knee: Secondary | ICD-10-CM | POA: Diagnosis not present

## 2018-11-10 DIAGNOSIS — M25562 Pain in left knee: Secondary | ICD-10-CM

## 2018-11-10 DIAGNOSIS — S82132A Displaced fracture of medial condyle of left tibia, initial encounter for closed fracture: Secondary | ICD-10-CM | POA: Insufficient documentation

## 2018-11-10 HISTORY — DX: Displaced fracture of medial condyle of left tibia, initial encounter for closed fracture: S82.132A

## 2018-11-10 MED ORDER — TRIAMCINOLONE ACETONIDE 40 MG/ML IJ SUSP
40.0000 mg | Freq: Once | INTRAMUSCULAR | Status: AC
  Administered 2018-11-10: 40 mg via INTRA_ARTICULAR

## 2018-11-10 MED ORDER — IBUPROFEN-FAMOTIDINE 800-26.6 MG PO TABS: 1.0000 | ORAL_TABLET | Freq: Three times a day (TID) | ORAL | 3 refills | Status: DC

## 2018-11-10 MED ORDER — PENNSAID 2 % TD SOLN: 1.0000 "application " | Freq: Two times a day (BID) | TRANSDERMAL | 3 refills | Status: DC

## 2018-11-10 NOTE — Assessment & Plan Note (Signed)
Pain seems to be most consistent with degenerative changes.  Likely has component of hip abduction weakness as well. -Counseled on home exercise therapy and supportive care. -We will stop meloxicam due to of the long extended use.  Initiate Pennsaid and Duexis. -Injection. -If no improvement can consider physical therapy

## 2018-11-10 NOTE — Progress Notes (Signed)
[name] [name] - 53 y.o. female MRN [id number]  Date of birth: [birth year]  SUBJECTIVE:  Including CC & ROS.  Chief Complaint  Patient presents with  . Knee Pain    left knee x 12 days    [name] [name] is a 53 y.o. female that is presenting with left knee pain.  The pain is been ongoing for about 2 weeks.  The pain is occurring over the medial joint line and posteriorly.  She denies any specific inciting event.  The pain is becoming more severe and constant.  She has taken Tylenol with limited improvement.  No prior history of surgery or injury.  The pain is localized to the knee.  Denies any mechanical symptoms.  Did have severe pain where she is unable to bear weight..  Independent review of the left knee x-ray from 9/8 shows no acute abnormality.  Mild degenerative changes.   Review of Systems  Constitutional: Negative for fever.  HENT: Negative for congestion.   Respiratory: Negative for cough.   Cardiovascular: Negative for chest pain.  Gastrointestinal: Negative for abdominal pain.  Musculoskeletal: Positive for gait problem.  Skin: Negative for color change.  Neurological: Negative for weakness.  Hematological: Negative for adenopathy.    HISTORY: Past Medical, Surgical, Social, and Family History Reviewed & Updated per EMR.   Pertinent Historical Findings include:  Past Medical History:  Diagnosis Date  . Anxiety 01/12/2013  . Asthma   . Chicken pox   . Chronic insomnia 11/24/2016  . Clostridium difficile infection 04/02/2014, 02/06/2014  . Depression   . Dyslipidemia   . HTN (hypertension)   . Hypothyroidism   . Migraine   . Multiple allergies   . Obesity   . RLS (restless legs syndrome)     Past Surgical History:  Procedure Laterality Date  . BREAST BIOPSY  [id number]   x 2 benign lesion  . CERVICAL DISCECTOMY  2003  . CERVICAL FUSION  2010   x2  . Hand fracture surgery    . LUMBAR FUSION  2016  . VAGINAL HYSTERECTOMY  2009    Allergies  Allergen  Reactions  . Bee Venom Swelling and Anaphylaxis  . Biaxin [Clarithromycin] Diarrhea and Rash  . Gabapentin Nausea And Vomiting    No reaction to Horizant  . Abilify [Aripiprazole] Other (See Comments)    MOOD SWING  . Buspar [Buspirone] Diarrhea  . Lisinopril Cough  . Maxzide [Triamterene-Hctz] Rash  . Phentermine Other (See Comments)    MOOD CHANGE   . Seroquel [Quetiapine Fumarate] Other (See Comments)    MOOD SWINGS    Family History  Problem Relation Age of Onset  . Hypertension Father   . Diabetes Father   . Heart disease Father   . Hearing loss Father   . Diabetes Brother   . Arthritis Mother   . Liver cancer Mother   . COPD Mother   . Mental illness Mother   . Diabetes Mother   . Hearing loss Mother   . Heart disease Mother      Social History   Socioeconomic History  . Marital status: Married    Spouse name: [name]  . Number of children: 2  . Years of education: COLLEGE  . Highest education level: Not on file  Occupational History  . Occupation: OWNER    Employer: [employer]  Social Needs  . Financial resource strain: Not on file  . Food insecurity    Worry: Not on file  Inability: Not on file  . Transportation needs    Medical: Not on file    Non-medical: Not on file  Tobacco Use  . Smoking status: Never Smoker  . Smokeless tobacco: Never Used  Substance and Sexual Activity  . Alcohol use: No    Alcohol/week: 0.0 standard drinks  . Drug use: No  . Sexual activity: Yes    Partners: Male    Birth control/protection: None  Lifestyle  . Physical activity    Days per week: Not on file    Minutes per session: Not on file  . Stress: Not on file  Relationships  . Social Herbalist on phone: Not on file    Gets together: Not on file    Attends religious service: Not on file    Active member of club or organization: Not on file    Attends meetings of clubs or organizations: Not on file    Relationship status: Not on file  .  Intimate partner violence    Fear of current or ex partner: Not on file    Emotionally abused: Not on file    Physically abused: Not on file    Forced sexual activity: Not on file  Other Topics Concern  . Not on file  Social History Narrative   Patient is married [name])  2 children [name] and [name])   Patient is right handed.   Patient has a college education. 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    Patient drinks 1 cups daily. Uses herbal remedies, takes a daily vitamin.   Wears her seatbelt, smoke detector in the home.     PHYSICAL EXAM:  VS: BP 115/71   Ht 5\' 4"  (1.626 m)   Wt 250 lb (113.4 kg)   BMI 42.91 kg/m  Physical Exam Gen: NAD, alert, cooperative with exam, well-appearing ENT: normal lips, normal nasal mucosa,  Eye: normal EOM, normal conjunctiva and lids CV:  no edema, +2 pedal pulses   Resp: no accessory muscle use, non-labored,  Skin: no rashes, no areas of induration  Neuro: normal tone, normal sensation to touch Psych:  normal insight, alert and oriented MSK:  Left knee: No obvious effusion. Tenderness to palpation of the medial joint line. No instability with valgus or varus stress testing. Normal McMurray's test. No pain with patellar grind. Neurovascular intact  Limited ultrasound: Left knee:  Normal amount of fluid within the suprapatellar pouch. Mild spurring of the quadriceps tendon at the insertion into the patella. More significant degenerative change of the medial joint line then observed on x-ray. Normal-appearing lateral joint line.   Summary: Findings suggestive of moderate degenerative changes in the medial joint line  Ultrasound and interpretation by Clearance Coots, MD    Aspiration/Injection Procedure Note [name] [name] 09/16/1965  Procedure: Injection Indications: Left knee pain  Procedure Details Consent: Risks of procedure as well as the alternatives and risks of each were explained to the (patient/caregiver).  Consent for  procedure obtained. Time Out: Verified patient identification, verified procedure, site/side was marked, verified correct patient position, special equipment/implants available, medications/allergies/relevent history reviewed, required imaging and test results available.  Performed.  The area was cleaned with iodine and alcohol swabs.    The left knee superior lateral suprapatellar pouch was injected using 1 cc's of 40 mg Kenalog and 4 cc's of 0.25% bupivacaine with a 22 1 1/2" needle.  Ultrasound was used. Images were obtained in long views showing the injection.     A  sterile dressing was applied.  Patient did tolerate procedure well.     ASSESSMENT & PLAN:   Primary osteoarthritis of left knee Pain seems to be most consistent with degenerative changes.  Likely has component of hip abduction weakness as well. -Counseled on home exercise therapy and supportive care. -We will stop meloxicam due to of the long extended use.  Initiate Pennsaid and Duexis. -Injection. -If no improvement can consider physical therapy

## 2018-11-10 NOTE — Patient Instructions (Signed)
Nice to meet you Please try the exercises  Please continue tylenol  Please stop the mobic and start duexis  Please send me a message in [place] with any questions or updates.  Please see me back in 4 weeks.   --Dr. Raeford Razor

## 2018-11-14 ENCOUNTER — Encounter: Payer: Self-pay | Admitting: Family Medicine

## 2018-11-14 ENCOUNTER — Other Ambulatory Visit: Payer: Self-pay | Admitting: Family Medicine

## 2018-11-14 MED ORDER — PREDNISONE 5 MG PO TABS: ORAL_TABLET | ORAL | 0 refills | Status: DC

## 2018-11-18 ENCOUNTER — Other Ambulatory Visit: Payer: Self-pay | Admitting: Family Medicine

## 2018-11-18 ENCOUNTER — Encounter: Payer: Self-pay | Admitting: Family Medicine

## 2018-11-18 DIAGNOSIS — M1712 Unilateral primary osteoarthritis, left knee: Secondary | ICD-10-CM

## 2018-11-21 ENCOUNTER — Ambulatory Visit (INDEPENDENT_AMBULATORY_CARE_PROVIDER_SITE_OTHER): Payer: BC Managed Care – PPO | Admitting: Family Medicine

## 2018-11-21 ENCOUNTER — Encounter: Payer: Self-pay | Admitting: Family Medicine

## 2018-11-21 ENCOUNTER — Ambulatory Visit: Payer: Self-pay

## 2018-11-21 ENCOUNTER — Ambulatory Visit (HOSPITAL_BASED_OUTPATIENT_CLINIC_OR_DEPARTMENT_OTHER)
Admission: RE | Admit: 2018-11-21 | Discharge: 2018-11-21 | Disposition: A | Discharge status: Home / Self Care | Payer: BC Managed Care – PPO | Source: Ambulatory Visit | Attending: Family Medicine | Admitting: Family Medicine

## 2018-11-21 ENCOUNTER — Other Ambulatory Visit: Payer: Self-pay

## 2018-11-21 VITALS — BP 175/74 | Ht 64.0 in | Wt 250.0 lb

## 2018-11-21 DIAGNOSIS — M1712 Unilateral primary osteoarthritis, left knee: Secondary | ICD-10-CM

## 2018-11-21 DIAGNOSIS — M1612 Unilateral primary osteoarthritis, left hip: Secondary | ICD-10-CM | POA: Diagnosis not present

## 2018-11-21 NOTE — Patient Instructions (Signed)
Good to see you Please try Ice and tylenol  Please try to avoid placing weight on the left side  You will get a call to schedule the MRI   Please send me a message in MyChart with any questions or updates.  We will call to do a virtual visit once the MRi is completed.   --Dr. Raeford Razor

## 2018-11-21 NOTE — Progress Notes (Signed)
[name] [name] - 53 y.o. female MRN [id number]  Date of birth: [birth year]  SUBJECTIVE:  Including CC & ROS.  Chief Complaint  Patient presents with  . Follow-up    follow up for left knee    [name] [name] is a 53 y.o. female that is following up for her left knee pain.  Her pain seems to have gotten significantly worse since she was last seen.  She had an intra-articular injection with no improvement.  She also did not have any improvement with the prednisone.  The pain seems to be occurring over the anterior medial proximal tibia.  It is localized to this area.  Having some swelling.  It seems to be worse with any movement.  Pain is constant and severe in nature.  Denies any ecchymosis.  Has like a burning sensation associated with it.   Review of Systems  Constitutional: Negative for fever.  HENT: Negative for congestion.   Eyes: Negative for photophobia.  Respiratory: Negative for cough.   Cardiovascular: Negative for chest pain.  Gastrointestinal: Negative for abdominal distention.  Musculoskeletal: Positive for gait problem.  Neurological: Negative for tremors.  Hematological: Negative for adenopathy.    HISTORY: Past Medical, Surgical, Social, and Family History Reviewed & Updated per EMR.   Pertinent Historical Findings include:  Past Medical History:  Diagnosis Date  . Anxiety 01/12/2013  . Asthma   . Chicken pox   . Chronic insomnia 11/24/2016  . Clostridium difficile infection 04/02/2014, 02/06/2014  . Depression   . Dyslipidemia   . HTN (hypertension)   . Hypothyroidism   . Migraine   . Multiple allergies   . Obesity   . RLS (restless legs syndrome)     Past Surgical History:  Procedure Laterality Date  . BREAST BIOPSY  [id number]   x 2 benign lesion  . CERVICAL DISCECTOMY  2003  . CERVICAL FUSION  2010   x2  . Hand fracture surgery    . LUMBAR FUSION  2016  . VAGINAL HYSTERECTOMY  2009    Allergies  Allergen Reactions  . Bee Venom Swelling and  Anaphylaxis  . Biaxin [Clarithromycin] Diarrhea and Rash  . Gabapentin Nausea And Vomiting    No reaction to Horizant  . Abilify [Aripiprazole] Other (See Comments)    MOOD SWING  . Buspar [Buspirone] Diarrhea  . Lisinopril Cough  . Maxzide [Triamterene-Hctz] Rash  . Phentermine Other (See Comments)    MOOD CHANGE   . Seroquel [Quetiapine Fumarate] Other (See Comments)    MOOD SWINGS    Family History  Problem Relation Age of Onset  . Hypertension Father   . Diabetes Father   . Heart disease Father   . Hearing loss Father   . Diabetes Brother   . Arthritis Mother   . Liver cancer Mother   . COPD Mother   . Mental illness Mother   . Diabetes Mother   . Hearing loss Mother   . Heart disease Mother      Social History   Socioeconomic History  . Marital status: Married    Spouse name: [name]  . Number of children: 2  . Years of education: COLLEGE  . Highest education level: Not on file  Occupational History  . Occupation: OWNER    Employer: [employer]  Social Needs  . Financial resource strain: Not on file  . Food insecurity    Worry: Not on file    Inability: Not on file  . Transportation needs  Medical: Not on file    Non-medical: Not on file  Tobacco Use  . Smoking status: Never Smoker  . Smokeless tobacco: Never Used  Substance and Sexual Activity  . Alcohol use: No    Alcohol/week: 0.0 standard drinks  . Drug use: No  . Sexual activity: Yes    Partners: Male    Birth control/protection: None  Lifestyle  . Physical activity    Days per week: Not on file    Minutes per session: Not on file  . Stress: Not on file  Relationships  . Social Herbalist on phone: Not on file    Gets together: Not on file    Attends religious service: Not on file    Active member of club or organization: Not on file    Attends meetings of clubs or organizations: Not on file    Relationship status: Not on file  . Intimate partner violence    Fear of  current or ex partner: Not on file    Emotionally abused: Not on file    Physically abused: Not on file    Forced sexual activity: Not on file  Other Topics Concern  . Not on file  Social History Narrative   Patient is married [name])  2 children [name] and [place])   Patient is right handed.   Patient has a college education. 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    Patient drinks 1 cups daily. Uses herbal remedies, takes a daily vitamin.   Wears her seatbelt, smoke detector in the home.     PHYSICAL EXAM:  VS: BP (!) 175/74   Ht 5\' 4"  (1.626 m)   Wt 250 lb (113.4 kg)   BMI 42.91 kg/m  Physical Exam Gen: NAD, alert, cooperative with exam, well-appearing ENT: normal lips, normal nasal mucosa,  Eye: normal EOM, normal conjunctiva and lids CV:  no edema, +2 pedal pulses   Resp: no accessory muscle use, non-labored,  Skin: no rashes, no areas of induration  Neuro: normal tone, normal sensation to touch Psych:  normal insight, alert and oriented MSK:  Left knee: No obvious effusion. Normal range of motion. Tenderness to palpation over the anterior proximal medial tibia. No ecchymosis No instability with valgus or varus stress testing. Negative McMurray's test. Neurovascular intact  Limited ultrasound: Left knee:  There appears to be significant vascular uptake in the anterior proximal medial tibia.  This appears to be outside of the joint itself.  This is possible for an insufficiency fracture versus an occult fracture  Summary: Possible insufficiency or occult fracture of the proximal medial tibia.  Ultrasound and interpretation by Clearance Coots, MD      ASSESSMENT & PLAN:   Primary osteoarthritis of left knee Pain is severe and had no improvement with the prednisone or the injection.  There is significant vascular uptake on ultrasound in this area to be concerning for fracture.  Unclear if this is referred pain from the hip itself -Hip x-ray -Hinged knee brace and crutches.  -MRI to evaluate for occult fracture.

## 2018-11-21 NOTE — Assessment & Plan Note (Signed)
Pain is severe and had no improvement with the prednisone or the injection.  There is significant vascular uptake on ultrasound in this area to be concerning for fracture.  Unclear if this is referred pain from the hip itself -Hip x-ray -Hinged knee brace and crutches. -MRI to evaluate for occult fracture.

## 2018-11-22 ENCOUNTER — Telehealth: Payer: Self-pay | Admitting: Family Medicine

## 2018-11-22 NOTE — Telephone Encounter (Signed)
Informed of results.   Rosemarie Ax, MD [facility] 11/22/2018, 11:03 AM

## 2018-11-23 DIAGNOSIS — M47816 Spondylosis without myelopathy or radiculopathy, lumbar region: Secondary | ICD-10-CM | POA: Diagnosis not present

## 2018-11-30 ENCOUNTER — Ambulatory Visit
Admission: RE | Admit: 2018-11-30 | Discharge: 2018-11-30 | Disposition: A | Discharge status: Home / Self Care | Payer: BC Managed Care – PPO | Source: Ambulatory Visit | Attending: Family Medicine | Admitting: Family Medicine

## 2018-11-30 DIAGNOSIS — S83232A Complex tear of medial meniscus, current injury, left knee, initial encounter: Secondary | ICD-10-CM | POA: Diagnosis not present

## 2018-11-30 DIAGNOSIS — M1712 Unilateral primary osteoarthritis, left knee: Secondary | ICD-10-CM

## 2018-12-01 ENCOUNTER — Other Ambulatory Visit: Payer: Self-pay

## 2018-12-01 ENCOUNTER — Ambulatory Visit (INDEPENDENT_AMBULATORY_CARE_PROVIDER_SITE_OTHER): Payer: BC Managed Care – PPO | Admitting: Family Medicine

## 2018-12-01 DIAGNOSIS — S83232A Complex tear of medial meniscus, current injury, left knee, initial encounter: Secondary | ICD-10-CM | POA: Insufficient documentation

## 2018-12-01 DIAGNOSIS — S82132D Displaced fracture of medial condyle of left tibia, subsequent encounter for closed fracture with routine healing: Secondary | ICD-10-CM | POA: Diagnosis not present

## 2018-12-01 DIAGNOSIS — A498 Other bacterial infections of unspecified site: Secondary | ICD-10-CM | POA: Insufficient documentation

## 2018-12-01 HISTORY — DX: Complex tear of medial meniscus, current injury, left knee, initial encounter: S83.232A

## 2018-12-01 NOTE — Assessment & Plan Note (Signed)
This likely represents more of a stress fracture.  Has not been performing any new or different exercises.  She first noticed it after a long car ride. -Counseled on supportive care. -Rollator to try to get her is non-weightbearing as possible. -Counseled on vitamin D and calcium. -May need to consider Prolia to help with bone health.

## 2018-12-01 NOTE — Assessment & Plan Note (Signed)
MRI was also revealing for complex tear.  This may need to be fixed in order to help with the fracture. -Referral to Ortho.  Dr. Rhona Raider

## 2018-12-01 NOTE — Progress Notes (Signed)
Virtual Visit via Video Note  I connected with RUBA SOBOTTA on 12/01/18 at  1:30 PM EDT by a video enabled telemedicine application and verified that I am speaking with the correct person using two identifiers.   I discussed the limitations of evaluation and management by telemedicine and the availability of in person appointments. The patient expressed understanding and agreed to proceed.  History of Present Illness:  Gregary Signs is a 53 year old [place] that is presenting with ongoing left knee pain.  She received a steroid injection and had no improvement.  She is tried a hinged knee brace and that hurts as well.  Is continued to have pain on the proximal aspect of the medial tibia.  MRI was revealing for a nondisplaced trabecular fracture involving the anterior medial aspect of the medial tibial plateau as well as a complex tear of the posterior horn and body of the medial meniscus.  She denies any injury or new or different exercises.  No prior history of stress fracture.   Observations/Objective:  Gen: NAD, alert,   Well-appearing ENT: normal lips, normal nasal mucosa,  Eye: normal EOM, normal conjunctiva and lids Resp: no accessory muscle use, non-labored,  Psych:  normal insight, alert and oriented    Assessment and Plan:  Complex tear of medial meniscus of left knee MRI was also revealing for complex tear.  This may need to be fixed in order to help with the fracture. -Referral to Ortho.  Dr. Rhona Raider   Left medial tibial plateau fracture This likely represents more of a stress fracture.  Has not been performing any new or different exercises.  She first noticed it after a long car ride. -Counseled on supportive care. -Rollator to try to get her is non-weightbearing as possible. -Counseled on vitamin D and calcium. -May need to consider Prolia to help with bone health.  Follow Up Instructions:    I discussed the assessment and treatment plan with the patient. The patient was  provided an opportunity to ask questions and all were answered. The patient agreed with the plan and demonstrated an understanding of the instructions.   The patient was advised to call back or seek an in-person evaluation if the symptoms worsen or if the condition fails to improve as anticipated.   Clearance Coots, MD

## 2018-12-02 DIAGNOSIS — M25562 Pain in left knee: Secondary | ICD-10-CM | POA: Diagnosis not present

## 2018-12-03 DIAGNOSIS — R0602 Shortness of breath: Secondary | ICD-10-CM | POA: Diagnosis not present

## 2018-12-03 DIAGNOSIS — Z8249 Family history of ischemic heart disease and other diseases of the circulatory system: Secondary | ICD-10-CM | POA: Diagnosis not present

## 2018-12-03 DIAGNOSIS — I4891 Unspecified atrial fibrillation: Secondary | ICD-10-CM | POA: Diagnosis not present

## 2018-12-03 DIAGNOSIS — F329 Major depressive disorder, single episode, unspecified: Secondary | ICD-10-CM | POA: Diagnosis not present

## 2018-12-03 DIAGNOSIS — I10 Essential (primary) hypertension: Secondary | ICD-10-CM | POA: Diagnosis not present

## 2018-12-03 DIAGNOSIS — Z6841 Body Mass Index (BMI) 40.0 and over, adult: Secondary | ICD-10-CM | POA: Diagnosis not present

## 2018-12-03 DIAGNOSIS — E876 Hypokalemia: Secondary | ICD-10-CM | POA: Diagnosis not present

## 2018-12-03 DIAGNOSIS — E669 Obesity, unspecified: Secondary | ICD-10-CM | POA: Diagnosis not present

## 2018-12-03 DIAGNOSIS — R0902 Hypoxemia: Secondary | ICD-10-CM | POA: Diagnosis not present

## 2018-12-03 DIAGNOSIS — R079 Chest pain, unspecified: Secondary | ICD-10-CM | POA: Diagnosis not present

## 2018-12-03 DIAGNOSIS — I471 Supraventricular tachycardia: Secondary | ICD-10-CM | POA: Diagnosis not present

## 2018-12-03 DIAGNOSIS — Z90710 Acquired absence of both cervix and uterus: Secondary | ICD-10-CM | POA: Diagnosis not present

## 2018-12-03 DIAGNOSIS — G2581 Restless legs syndrome: Secondary | ICD-10-CM | POA: Diagnosis not present

## 2018-12-03 DIAGNOSIS — F419 Anxiety disorder, unspecified: Secondary | ICD-10-CM | POA: Diagnosis not present

## 2018-12-03 DIAGNOSIS — R000 Tachycardia, unspecified: Secondary | ICD-10-CM | POA: Diagnosis not present

## 2018-12-03 DIAGNOSIS — E119 Type 2 diabetes mellitus without complications: Secondary | ICD-10-CM | POA: Diagnosis not present

## 2018-12-03 DIAGNOSIS — E039 Hypothyroidism, unspecified: Secondary | ICD-10-CM | POA: Diagnosis not present

## 2018-12-03 LAB — CBC AND DIFFERENTIAL
HCT: 41 (ref 36–46)
Hemoglobin: 14 (ref 12.0–16.0)
Platelets: 272 (ref 150–399)
WBC: 10.9

## 2018-12-03 LAB — BASIC METABOLIC PANEL
BUN: 17 (ref 4–21)
Creatinine: 0.6 (ref 0.5–1.1)
Potassium: 3.3 — AB (ref 3.4–5.3)
Sodium: 138 (ref 137–147)

## 2018-12-03 LAB — HEPATIC FUNCTION PANEL
ALT: 17 (ref 7–35)
AST: 13 (ref 13–35)
Bilirubin, Total: 0.3

## 2018-12-05 ENCOUNTER — Encounter: Payer: Self-pay | Admitting: Family Medicine

## 2018-12-05 NOTE — Telephone Encounter (Signed)
Printed paperwork that patient sent via my chart

## 2018-12-06 ENCOUNTER — Ambulatory Visit (INDEPENDENT_AMBULATORY_CARE_PROVIDER_SITE_OTHER): Payer: BC Managed Care – PPO | Admitting: Family Medicine

## 2018-12-06 ENCOUNTER — Other Ambulatory Visit: Payer: Self-pay

## 2018-12-06 ENCOUNTER — Encounter: Payer: Self-pay | Admitting: Family Medicine

## 2018-12-06 ENCOUNTER — Telehealth: Payer: Self-pay | Admitting: Family Medicine

## 2018-12-06 VITALS — BP 124/78 | HR 59 | Temp 97.6°F | Resp 17 | Ht 64.0 in | Wt 254.5 lb

## 2018-12-06 DIAGNOSIS — E876 Hypokalemia: Secondary | ICD-10-CM

## 2018-12-06 DIAGNOSIS — F339 Major depressive disorder, recurrent, unspecified: Secondary | ICD-10-CM | POA: Diagnosis not present

## 2018-12-06 DIAGNOSIS — R7309 Other abnormal glucose: Secondary | ICD-10-CM | POA: Diagnosis not present

## 2018-12-06 DIAGNOSIS — I4891 Unspecified atrial fibrillation: Secondary | ICD-10-CM | POA: Insufficient documentation

## 2018-12-06 DIAGNOSIS — E2839 Other primary ovarian failure: Secondary | ICD-10-CM | POA: Insufficient documentation

## 2018-12-06 DIAGNOSIS — I10 Essential (primary) hypertension: Secondary | ICD-10-CM | POA: Diagnosis not present

## 2018-12-06 DIAGNOSIS — E119 Type 2 diabetes mellitus without complications: Secondary | ICD-10-CM

## 2018-12-06 DIAGNOSIS — E079 Disorder of thyroid, unspecified: Secondary | ICD-10-CM

## 2018-12-06 DIAGNOSIS — E039 Hypothyroidism, unspecified: Secondary | ICD-10-CM

## 2018-12-06 DIAGNOSIS — E559 Vitamin D deficiency, unspecified: Secondary | ICD-10-CM | POA: Insufficient documentation

## 2018-12-06 DIAGNOSIS — S82132D Displaced fracture of medial condyle of left tibia, subsequent encounter for closed fracture with routine healing: Secondary | ICD-10-CM

## 2018-12-06 HISTORY — DX: Vitamin D deficiency, unspecified: E55.9

## 2018-12-06 HISTORY — DX: Hypokalemia: E87.6

## 2018-12-06 HISTORY — DX: Unspecified atrial fibrillation: I48.91

## 2018-12-06 HISTORY — DX: Other primary ovarian failure: E28.39

## 2018-12-06 LAB — COMPREHENSIVE METABOLIC PANEL
ALT: 21 U/L (ref 0–35)
AST: 11 U/L (ref 0–37)
Albumin: 3.9 g/dL (ref 3.5–5.2)
Alkaline Phosphatase: 75 U/L (ref 39–117)
BUN: 18 mg/dL (ref 6–23)
CO2: 30 mEq/L (ref 19–32)
Calcium: 9.4 mg/dL (ref 8.4–10.5)
Chloride: 98 mEq/L (ref 96–112)
Creatinine, Ser: 0.56 mg/dL (ref 0.40–1.20)
GFR: 113.48 mL/min (ref >60.00)
Glucose, Bld: 95 mg/dL (ref 70–99)
Potassium: 4 mEq/L (ref 3.5–5.1)
Sodium: 138 mEq/L (ref 135–145)
Total Bilirubin: 0.4 mg/dL (ref 0.2–1.2)
Total Protein: 6.4 g/dL (ref 6.0–8.3)

## 2018-12-06 LAB — TSH: TSH: 2.89 u[IU]/mL (ref 0.35–4.50)

## 2018-12-06 LAB — HEMOGLOBIN A1C: Hgb A1c MFr Bld: 6.3 % (ref 4.6–6.5)

## 2018-12-06 MED ORDER — METFORMIN HCL 1000 MG PO TABS: 1000.0000 mg | ORAL_TABLET | Freq: Two times a day (BID) | ORAL | 1 refills | Status: DC

## 2018-12-06 NOTE — Patient Instructions (Addendum)
1/2 tab of metoprolol daily.  Referred to cardiology and ordered bone scan    Atrial Fibrillation  Atrial fibrillation is a type of heartbeat that is irregular or fast (rapid). If you have this condition, your heart beats without any order. This makes it hard for your heart to pump blood in a normal way. Having this condition gives you more risk for stroke, heart failure, and other heart problems. Atrial fibrillation may start all of a sudden and then stop on its own, or it may become a long-lasting problem. What are the causes? This condition may be caused by heart conditions, such as:  High blood pressure.  Heart failure.  Heart valve disease.  Heart surgery. Other causes include:  Pneumonia.  Obstructive sleep apnea.  Lung cancer.  Thyroid disease.  Drinking too much alcohol. Sometimes the cause is not known. What increases the risk? You are more likely to develop this condition if:  You smoke.  You are older.  You have diabetes.  You are overweight.  You have a family history of this condition.  You exercise often and hard. What are the signs or symptoms? Common symptoms of this condition include:  A feeling like your heart is beating very fast.  Chest pain.  Feeling short of breath.  Feeling light-headed or weak.  Getting tired easily. Follow these instructions at home: Medicines  Take over-the-counter and prescription medicines only as told by your doctor.  If your doctor gives you a blood-thinning medicine, take it exactly as told. Taking too much of it can cause bleeding. Taking too little of it does not protect you against clots. Clots can cause a stroke. Lifestyle      Do not use any tobacco products. These include cigarettes, chewing tobacco, and e-cigarettes. If you need help quitting, ask your doctor.  Do not drink alcohol.  Do not drink beverages that have caffeine. These include coffee, soda, and tea.  Follow diet instructions  as told by your doctor.  Exercise regularly as told by your doctor. General instructions  If you have a condition that causes breathing to stop for a short period of time (apnea), treat it as told by your doctor.  Keep a healthy weight. Do not use diet pills unless your doctor says they are safe for you. Diet pills may make heart problems worse.  Keep all follow-up visits as told by your doctor. This is important. Contact a doctor if:  You notice a change in the speed, rhythm, or strength of your heartbeat.  You are taking a blood-thinning medicine and you see more bruising.  You get tired more easily when you move or exercise.  You have a sudden change in weight. Get help right away if:   You have pain in your chest or your belly (abdomen).  You have trouble breathing.  You have blood in your vomit, poop, or pee (urine).  You have any signs of a stroke. "BE FAST" is an easy way to remember the main warning signs: ? B - Balance. Signs are dizziness, sudden trouble walking, or loss of balance. ? E - Eyes. Signs are trouble seeing or a change in how you see. ? F - Face. Signs are sudden weakness or loss of feeling in the face, or the face or eyelid drooping on one side. ? A - Arms. Signs are weakness or loss of feeling in an arm. This happens suddenly and usually on one side of the body. ? S - Speech. Signs  are sudden trouble speaking, slurred speech, or trouble understanding what people say. ? T - Time. Time to call emergency services. Write down what time symptoms started.  You have other signs of a stroke, such as: ? A sudden, very bad headache with no known cause. ? Feeling sick to your stomach (nausea). ? Throwing up (vomiting). ? Jerky movements you cannot control (seizure). These symptoms may be an emergency. Do not wait to see if the symptoms will go away. Get medical help right away. Call your local emergency services (911 in the [place] Do not drive yourself to the  hospital. Summary  Atrial fibrillation is a type of heartbeat that is irregular or fast (rapid).  You are at higher risk of this condition if you smoke, are older, have diabetes, or are overweight.  Follow your doctor's instructions about medicines, diet, exercise, and follow-up visits.  Get help right away if you think that you have signs of a stroke. This information is not intended to replace advice given to you by your health care provider. Make sure you discuss any questions you have with your health care provider. Document Released: 11/26/2007 Document Revised: 04/22/2017 Document Reviewed: 04/09/2017 Elsevier Patient Education  2020 [employer].

## 2018-12-06 NOTE — Telephone Encounter (Signed)
Please inform patient the following information: Her labs are all normal.  Her A1c went from 6.5 to 6.3.  I have increased her metformin to 1000 mg twice daily.  New prescription called in for higher dose.  She can continue finishing the bottle she has by increasing her tabs to 2 tabs twice daily of her 500 mg tabs.  Just make sure she is aware when she picks up a new bottle to make sure it is to 1000 mg tablet and then only take 1 tab twice daily again. Goal A1c less than 6.0  Her thyroids in normal range and her electrolytes are all normal.  I have referred her to the cardiologist and they should be calling to schedule her a bone density scan as well. Follow-up in 4 months for chronic conditions/diabetes.

## 2018-12-06 NOTE — Progress Notes (Signed)
[name] [name] , [birth year], 53 y.o., female MRN: [id number] Patient Care Team    Relationship Specialty Notifications Start End  [name], DO PCP - General Family Medicine  07/07/16   Ob/Gyn, [name]    07/08/16   Wilford Corner, MD Consulting Physician Gastroenterology  07/08/16   [name], MD Consulting Physician Neurology  07/08/16   [name], MD Attending Physician Endocrinology  07/08/16    Comment: Pt established with Si Raider, NP at this location. - [facility]- thyroid    Chief Complaint  Patient presents with   Hospitalization Follow-up    Pt went to ED for AFIB. Need cardiology referral. Would like to see Dr Lauree Chandler      Subjective:  [name] [name]  is a 53 y.o. female presents for  follow up after recent observation admission on 12/03/2018 for primary diagnosis chest pain and new onset a.fib. Shedischarged on 12/03/2018 to home. Patients discharge summary has been reviewed, as well as all labs/image studies obtained during hospitalization.   Patients hospital course: Patient had gone into the emergency room when she was out of town in [facility] secondary to palpitations/racing heart and chest discomfort.  She does endorse having mild shortness of breath at that time as well.  She called EMS and was transported to the emergency room was found to be in A. fib with RVR and they started a Cardizem drip.  CBC: WBC 10.9, RBC 4.5, hemoglobin 14, hematocrit 41, MCV 91, platelets 272.  Troponin negative.  BNP 3 1.  Sodium 138, potassium 3.3, chloride 101, glucose 116, creatinine 0.58, calcium 9.2, AST 13, ALT 17, lipase 19.  Chest x-ray was completed with crowding of the bilateral pulmonary vascular-the findings may be related to central pulmonary vascular congestion versus pulmonary hypertension.  Otherwise normal chest x-ray.  Patient was seen by cardiology in the hospital, and deemed safe for discharge with follow-up with  cardiology.  EKG reported as sinus rhythm.  Heart rate 99.  No ST changes.  No T wave abnormalities.  No old EKG for comparison.  Normal EKG. Since hospital discharge patient reports she has been doing well since discharge. She has had no repeat occurrence of chest pain or rapid heartbeat. She is tolerating the metoprolol 25 mg XL, but admits she has felt rather fatigued since starting medication.  Diabetes: Pt reports compliance with metformin 500 mg twice daily without any side effects noted.. Denies numbness, tingling of extremities, hypo/hyperglycemic events or non-healing wounds.  PNA series: Will offer next diabetic appointment. Flu shot: Declined today (recommneded yearly) BMP: Repeated today Foot exam: We will start foot exams at her next appointment. Eye exam: Encouraged yearly eye exams. A1c: 6.5 > 6.3 today   Recent Labs  Lab 12/03/18  HGB 14.0  HCT 41  WBC 10.9  PLT 272   CMP Latest Ref Rng & Units 12/03/2018 08/02/2018 01/06/2017  Glucose 70 - 99 mg/dL - 102(H) 92  BUN 4 - _0 Creatinine 0.5 - 1.1 0.6 0.55 0.62  Sodium 137 - 147 138 140 140  Potassium 3.4 - 5.3 3.3(A) 4.1 3.7  Chloride 96 - 112 mEq/L - 99 102  CO2 19 - 32 mEq/L - 32 30  Calcium 8.4 - 10.5 mg/dL - 9.6 9.8  Total Protein 6.0 - 8.3 g/dL - 6.5 6.8  Total Bilirubin 0.2 - 1.2 mg/dL - 0.4 0.3  Alkaline Phos 39 - 117 U/L -  72 75  AST 13 - 35 _0 ALT 7 - 35 _1 Allergies  Allergen Reactions   Bee Venom Swelling and Anaphylaxis   Biaxin [Clarithromycin] Diarrhea and Rash   Gabapentin Nausea And Vomiting    No reaction to Horizant   Abilify [Aripiprazole] Other (See Comments)    MOOD SWING   Buspar [Buspirone] Diarrhea   Lisinopril Cough   Maxzide [Triamterene-Hctz] Rash   Phentermine Other (See Comments)    MOOD CHANGE    Seroquel [Quetiapine Fumarate] Other (See Comments)    MOOD SWINGS   Social History   Tobacco Use   Smoking status: Never Smoker   Smokeless  tobacco: Never Used  Substance Use Topics   Alcohol use: No    Alcohol/week: 0.0 standard drinks   Past Medical History:  Diagnosis Date   Anxiety 01/12/2013   Asthma    Chicken pox    Chronic insomnia 11/24/2016   Clostridium difficile infection 04/02/2014, 02/06/2014   Depression    Dyslipidemia    HTN (hypertension)    Hypothyroidism    Migraine    Multiple allergies    Obesity    RLS (restless legs syndrome)    Past Surgical History:  Procedure Laterality Date   BREAST BIOPSY  [id number],9672   x 2 benign lesion   CERVICAL DISCECTOMY  2003   CERVICAL FUSION  2010   x2   Hand fracture surgery     LUMBAR FUSION  2016   VAGINAL HYSTERECTOMY  2009   Family History  Problem Relation Age of Onset   Hypertension Father    Diabetes Father    Heart disease Father    Hearing loss Father    Diabetes Brother    Arthritis Mother    Liver cancer Mother    COPD Mother    Mental illness Mother    Diabetes Mother    Hearing loss Mother    Heart disease Mother    Allergies as of 12/06/2018      Reactions   Bee Venom Swelling, Anaphylaxis   Biaxin [clarithromycin] Diarrhea, Rash   Gabapentin Nausea And Vomiting   No reaction to Horizant   Abilify [aripiprazole] Other (See Comments)   MOOD SWING   Buspar [buspirone] Diarrhea   Lisinopril Cough   Maxzide [triamterene-hctz] Rash   Phentermine Other (See Comments)   MOOD CHANGE   Seroquel [quetiapine Fumarate] Other (See Comments)   MOOD SWINGS      Medication List       Accurate as of December 06, 2018  8:56 AM. If you have any questions, ask your nurse or doctor.        buPROPion 300 MG 24 hr tablet Commonly known as: WELLBUTRIN XL Take 1 tablet (300 mg total) by mouth daily. What changed: when to take this   cholecalciferol 1000 units tablet Commonly known as: VITAMIN D Take 2,000 Units by mouth daily.   cyclobenzaprine 5 MG tablet Commonly known as: FLEXERIL Take 1 tablet (5 mg  total) by mouth 3 (three) times daily as needed for muscle spasms.   diazepam 5 MG tablet Commonly known as: VALIUM Take 1 tablet (5 mg total) by mouth every 8 (eight) hours as needed for muscle spasms.   DULoxetine 60 MG capsule Commonly known as: CYMBALTA Take 1 capsule (60 mg total) by mouth daily. Needs office visit prior to anymore refills. What changed: when to take this   estradiol 2 MG tablet Commonly  known as: ESTRACE Take 1 tablet (2 mg total) by mouth daily.   FISH OIL PO Take 1 capsule by mouth daily.   Horizant 300 MG Tbcr Generic drug: Gabapentin Enacarbil ER TAKE 2 TABLETS BY MOUTH EVERY DAY AT 5PM WITH FOOD What changed: See the new instructions.   Ibuprofen-Famotidine 800-26.6 MG Tabs Take 1 tablet by mouth 3 (three) times daily.   levothyroxine 150 MCG tablet Commonly known as: SYNTHROID TAKE 1 TABLET BY MOUTH EVERY DAY   magnesium oxide 400 (241.3 Mg) MG tablet Commonly known as: MAG-OX   meloxicam 15 MG tablet Commonly known as: MOBIC Take 1 tablet (15 mg total) by mouth daily.   metFORMIN 500 MG tablet Commonly known as: GLUCOPHAGE Take 1 tablet (500 mg total) by mouth 2 (two) times daily with a meal.   metoprolol succinate 25 MG 24 hr tablet Commonly known as: TOPROL-XL   Pennsaid 2 % Soln Generic drug: Diclofenac Sodium Place 1 application onto the skin 2 (two) times daily.   predniSONE 5 MG tablet Commonly known as: DELTASONE Take 6 pills for first day, 5 pills second day, 4 pills third day, 3 pills fourth day, 2 pills the fifth day, and 1 pill sixth day.   progesterone 100 MG capsule Commonly known as: PROMETRIUM Take 1 capsule (100 mg total) by mouth every other day.   rOPINIRole 2 MG tablet Commonly known as: REQUIP Take 1/2 tablet at lunch and 1/2 tablet at bed time. What changed:   how much to take  how to take this  when to take this  additional instructions   rOPINIRole 4 MG 24 hr tablet Commonly known as: REQUIP  XL Take 1 tablet (4 mg total) by mouth daily. What changed: Another medication with the same name was changed. Make sure you understand how and when to take each.   telmisartan-hydrochlorothiazide 80-25 MG tablet Commonly known as: MICARDIS HCT Take 1 tablet by mouth daily. What changed: when to take this       All past medical history, surgical history, allergies, family history, immunizations and medications were updated in the EMR today and reviewed under the history and medication portions of their EMR.      ROS: Negative, with the exception of above mentioned in HPI   Objective:  BP 124/78 (BP Location: Left Arm, Patient Position: Sitting, Cuff Size: Large)    Pulse (!) 59    Temp 97.6 F (36.4 C) (Temporal)    Resp 17    Ht _0  (1.626 m)    Wt 254 lb 8 oz (115.4 kg)    SpO2 99%    BMI 43.68 kg/m  Body mass index is 43.68 kg/m. Gen: Afebrile. No acute distress. Nontoxic in appearance, well developed, well nourished. pleasant caucasian female.  HENT: AT. [place].  Eyes:Pupils Equal Round Reactive to light, Extraocular movements intact,  Conjunctiva without redness, discharge or icterus. Neck/lymp/endocrine: Supple,no lymphadenopathy, no thyromegaly CV: RRR, no edema Chest: CTAB, no wheeze or crackles. Good air movement, normal resp effort.  Abd: Soft. NTND. BS +  Neuro: Normal gait. PERLA. EOMi. Alert. Oriented x3  Psych: Normal affect, dress and demeanor. Normal speech. Normal thought content and judgment.  Assessment/Plan: STACY SAILER is a 53 y.o. female present for OV for Hospital discharge follow up Elevated hemoglobin A1c Tolerating start metformin 500 mg BID PNA series: Will offer next diabetic appointment. Flu shot: Declined today (recommneded yearly) BMP: Repeated today Foot exam: We will start foot exams at her  next appointment. Eye exam: Encouraged yearly eye exams. A1c: 6.5 > 6.3 - Hemoglobin A1c -Follow-up 4 months  Hypokalemia - not taking additional  supplement. Received 2 pills on hosp DC per pt - Comp Met (CMET)  Atrial fibrillation, unspecified type (HCC)/HTN -New onset atrial fibrillation with RVR.  She is feeling more fatigued since starting the metoprolol 25 mg daily.  She has mild bradycardia on exam today.  Encouraged her to take 12.5 mg of metoprolol to see if that helps with her energy level.  If she has any palpitations she needs to increase back to the 25 mg dose.  She reports understanding.  We will get her to cardiology. -Encouraged her to start baby aspirin if able to tolerate. - TSH - Ambulatory referral to Cardiology  Closed fracture of medial portion of left tibial plateau with routine healing, subsequent encounter/ Thyroid disease/Vitamin D deficiency/Estrogen deficiency Patient has a history of vitamin D deficiency, estrogen deficiency and recent fracture of her lower extremity.  Bone density ordered secondary to fracture type to rule out osteoporosis condition. - DG Bone Density; Future   Reviewed expectations re: course of current medical issues.  Discussed self-management of symptoms.  Outlined signs and symptoms indicating need for more acute intervention.  Patient verbalized understanding and all questions were answered.  Patient received an After-Visit Summary.  Any changes in medications were reviewed and patient was provided with updated med list with their AVS.      No orders of the defined types were placed in this encounter.    Note is dictated utilizing voice recognition software. Although note has been proof read prior to signing, occasional typographical errors still can be missed. If any questions arise, please do not hesitate to call for verification.   electronically signed by:  Howard Pouch, DO  [place]

## 2018-12-07 ENCOUNTER — Encounter: Payer: Self-pay | Admitting: Family Medicine

## 2018-12-07 DIAGNOSIS — E119 Type 2 diabetes mellitus without complications: Secondary | ICD-10-CM | POA: Insufficient documentation

## 2018-12-07 HISTORY — DX: Type 2 diabetes mellitus without complications: E11.9

## 2018-12-07 NOTE — Telephone Encounter (Signed)
Pt was called and given results, information, she verbalized understanding

## 2018-12-08 ENCOUNTER — Ambulatory Visit: Payer: BC Managed Care – PPO | Admitting: Family Medicine

## 2018-12-15 ENCOUNTER — Encounter: Payer: Self-pay | Admitting: Cardiovascular Disease

## 2018-12-15 ENCOUNTER — Ambulatory Visit (INDEPENDENT_AMBULATORY_CARE_PROVIDER_SITE_OTHER): Payer: BC Managed Care – PPO | Admitting: Cardiovascular Disease

## 2018-12-15 ENCOUNTER — Other Ambulatory Visit: Payer: Self-pay

## 2018-12-15 VITALS — BP 138/80 | HR 74 | Ht 64.0 in | Wt 253.1 lb

## 2018-12-15 DIAGNOSIS — I480 Paroxysmal atrial fibrillation: Secondary | ICD-10-CM | POA: Diagnosis not present

## 2018-12-15 DIAGNOSIS — Z9189 Other specified personal risk factors, not elsewhere classified: Secondary | ICD-10-CM | POA: Diagnosis not present

## 2018-12-15 MED ORDER — POTASSIUM CHLORIDE CRYS ER 20 MEQ PO TBCR: 20.0000 meq | EXTENDED_RELEASE_TABLET | Freq: Every day | ORAL | 3 refills | Status: DC

## 2018-12-15 MED ORDER — APIXABAN 5 MG PO TABS: 5.0000 mg | ORAL_TABLET | Freq: Two times a day (BID) | ORAL | 11 refills | Status: DC

## 2018-12-15 NOTE — Patient Instructions (Addendum)
Look into getting a kardia monitor ( by [employer])   Medication Instructions:  Your physician has recommended you make the following change in your medication:  START Kdur (potassium chloride) 20 mEq once daily START Eliquis (apixaban) 5 mg twice daily - one in the morning and one in the evening  *If you need a refill on your cardiac medications before your next appointment, please call your pharmacy*  Lab Work: Your physician recommends that you return for lab work in: 3-4 weeks  If you have labs (blood work) drawn today and your tests are completely normal, you will receive your results only by: Marland Kitchen MyChart Message (if you have MyChart) OR . A paper copy in the mail If you have any lab test that is abnormal or we need to change your treatment, we will call you to review the results.   Testing/Procedures: Your physician has requested that you have an echocardiogram. Echocardiography is a painless test that uses sound waves to create images of your heart. It provides your doctor with information about the size and shape of your heart and how well your heart's chambers and valves are working. This procedure takes approximately one hour. There are no restrictions for this procedure.    Follow-Up: At [facility], you and your health needs are our priority.  As part of our continuing mission to provide you with exceptional heart care, we have created designated Provider Care Teams.  These Care Teams include your primary Cardiologist (physician) and Advanced Practice Providers (APPs -  Physician Assistants and Nurse Practitioners) who all work together to provide you with the care you need, when you need it.  Your next appointment:   3 months on Friday January 15 at 8:00 am   The format for your next appointment:   In Person  Provider:   You may see Dr. Acie Fredrickson or one of the following Advanced Practice Providers on your designated Care Team:    Richardson Dopp, PA-C  [place],  [place]  Daune Perch, [place]

## 2018-12-15 NOTE — Progress Notes (Signed)
Cardiology Office Note:    Date:  12/15/2018   ID:  [name] [name], DOB January 27, 1966, MRN [id number]  PCP:  [name], DO  Cardiologist:   Julia Kulzer  Electrophysiologist:  None   Referring MD: [name], DO   Chief Complaint  Patient presents with  . Atrial Fibrillation     History of Present Illness:    [name] [name] is a 53 y.o. female with a hx of hypertension, hypothyroidism, hypokalemia, diabetes mellitus.  She was down in [place] visiting family.  She was just getting ready to go to bed and felt her heart started to race.  It was associated with chest pain.  She tried to calm herself down to convert her heart back to normal but was unsuccessful.  EMS was called.  They found her to be in atrial fibrillation with a rapid ventricular response.  [facility].  She had converted to sinus rhythm by the time she arrived and was hooked up to the EKG machine.  She was found to have hypokalemia.  Has a fairly constant upper left chest pain that has been present since this episode.   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 Anxiety 01/12/2013  . Arthralgia 07/08/2016  . Asthma   . Atrial fibrillation ([place]) 12/06/2018  . B12 deficiency 01/31/2018  . Chicken pox   . Chronic insomnia 11/24/2016  . Clostridium difficile infection 04/02/2014, 02/06/2014  . Complex tear of medial meniscus of left knee as current injury 12/01/2018  . DDD (degenerative disc disease), lumbar 09/09/2018  . Depression   . Diabetes mellitus without complication ([place]) [id number]  . Dyslipidemia   . Elevated hemoglobin A1c 07/08/2016  . Estrogen deficiency 12/06/2018  . Foraminal stenosis of lumbar region 09/09/2018  . Hair loss 07/20/2017  . History of lumbar fusion 09/09/2018  .  Hormone replacement therapy (HRT) 07/20/2017  . HTN (hypertension)   . Hypertension 07/08/2016  . Hypokalemia 12/06/2018  . Hypothyroidism   . Left medial tibial plateau fracture 11/10/2018  . Lumbar radiculopathy 09/09/2018   Referred to NS 09/09/2018  . Migraine   . Mild memory disturbance 11/13/2013  . Morbid obesity ([place]) 07/08/2016  . Multiple allergies   . Obesity   . Restless legs syndrome (RLS) 01/12/2013   Follows with Dr. Jannifer Franklin who prescribes Requip and gabapentin  . RLS (restless legs syndrome)   . Vitamin D deficiency 12/06/2018  . Wears glasses 07/08/2016    Past Surgical History:  Procedure Laterality Date  . BREAST BIOPSY  [id number]   x 2 benign lesion  . CERVICAL DISCECTOMY  2003  . CERVICAL FUSION  2010   x2  . Hand fracture surgery    . LUMBAR FUSION  2016  . VAGINAL HYSTERECTOMY  2009    Current Medications: Current Meds  Medication Sig  . buPROPion (WELLBUTRIN XL) 300 MG 24 hr tablet Take 1 tablet (300 mg total) by mouth daily.  . cholecalciferol (VITAMIN D) 1000 units tablet Take 2,000 Units by mouth daily.   . DULoxetine (CYMBALTA) 60 MG capsule Take 1 capsule (60 mg total) by mouth daily. Needs office visit prior to anymore refills.  Marland Kitchen estradiol (ESTRACE) 2 MG tablet Take 1 tablet (  2 mg total) by mouth daily.  Marland Kitchen HORIZANT 300 MG TBCR TAKE 2 TABLETS BY MOUTH EVERY DAY AT 5PM WITH FOOD  . Ibuprofen-Famotidine 800-26.6 MG TABS Take 1 tablet by mouth 3 (three) times daily.  Marland Kitchen levothyroxine (SYNTHROID) 150 MCG tablet TAKE 1 TABLET BY MOUTH EVERY DAY  . magnesium oxide (MAG-OX) 400 (241.3 Mg) MG tablet   . metFORMIN (GLUCOPHAGE) 1000 MG tablet Take 1 tablet (1,000 mg total) by mouth 2 (two) times daily with a meal.  . metoprolol succinate (TOPROL-XL) 25 MG 24 hr tablet Take 12.5 mg by mouth daily.   . Omega-3 Fatty Acids (FISH OIL PO) Take 1 capsule by mouth daily.  . progesterone (PROMETRIUM) 100 MG capsule Take 1 capsule (100 mg total) by mouth every other day.  Marland Kitchen  rOPINIRole (REQUIP XL) 4 MG 24 hr tablet Take 1 tablet (4 mg total) by mouth daily.  Marland Kitchen rOPINIRole (REQUIP) 2 MG tablet Take 1/2 tablet at lunch and 1/2 tablet at bed time.  Marland Kitchen telmisartan-hydrochlorothiazide (MICARDIS HCT) 80-25 MG tablet Take 1 tablet by mouth daily.     Allergies:   Bee venom, Biaxin [clarithromycin], Gabapentin, Abilify [aripiprazole], Buspar [buspirone], Lisinopril, Maxzide [triamterene-hctz], Phentermine, and Seroquel [quetiapine fumarate]   Social History   Socioeconomic History  . Marital status: Married    Spouse name: [name]  . Number of children: 2  . Years of education: COLLEGE  . Highest education level: Not on file  Occupational History  . Occupation: OWNER    Employer: [employer]  Social Needs  . Financial resource strain: Not on file  . Food insecurity    Worry: Not on file    Inability: Not on file  . Transportation needs    Medical: Not on file    Non-medical: Not on file  Tobacco Use  . Smoking status: Never Smoker  . Smokeless tobacco: Never Used  Substance and Sexual Activity  . Alcohol use: No    Alcohol/week: 0.0 standard drinks  . Drug use: No  . Sexual activity: Yes    Partners: Male    Birth control/protection: None  Lifestyle  . Physical activity    Days per week: Not on file    Minutes per session: Not on file  . Stress: Not on file  Relationships  . Social Herbalist on phone: Not on file    Gets together: Not on file    Attends religious service: Not on file    Active member of club or organization: Not on file    Attends meetings of clubs or organizations: Not on file    Relationship status: Not on file  Other Topics Concern  . Not on file  Social History Narrative   Patient is married [name])  2 children [name] [name] and [name] [name])   Patient is right handed.   Patient has a college education. 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    Patient drinks 1 cups daily. Uses herbal remedies, takes a daily vitamin.   Wears her  seatbelt, smoke detector in the home.     Family History: The patient's family history includes Arthritis in her mother; COPD in her mother; Diabetes in her brother, father, and mother; Hearing loss in her father and mother; Heart disease in her father and mother; Hypertension in her father; Liver cancer in her mother; Mental illness in her mother.  ROS:   Please see the history of present illness.     All other systems reviewed and are  negative.  EKGs/Labs/Other Studies Reviewed:    The following studies were reviewed today:   EKG: December 15, 2018: Normal sinus rhythm at 74.  Poor R wave progression.  Recent Labs: 12/03/2018: Hemoglobin 14.0; Platelets 272 12/06/2018: ALT 21; BUN 18; Creatinine, Ser 0.56; Potassium 4.0; Sodium 138; TSH 2.89  Recent Lipid Panel    Component Value Date/Time   CHOL 150 08/02/2018 0902   TRIG 183.0 (H) 08/02/2018 0902   HDL 48.60 08/02/2018 0902   CHOLHDL 3 08/02/2018 0902   VLDL 36.6 08/02/2018 0902   LDLCALC 65 08/02/2018 0902    Physical Exam:    VS:  BP 138/80   Pulse 74   Ht 5\' 4"  (1.626 m)   Wt 253 lb 1.9 oz (114.8 kg)   SpO2 98%   BMI 43.45 kg/m     Wt Readings from Last 3 Encounters:  12/15/18 253 lb 1.9 oz (114.8 kg)  12/06/18 254 lb 8 oz (115.4 kg)  11/21/18 250 lb (113.4 kg)     GEN:  Well nourished, well developed in no acute distress HEENT: Normal NECK: No JVD; No carotid bruits LYMPHATICS: No lymphadenopathy CARDIAC: RRR, no murmurs, rubs, gallops RESPIRATORY:  Clear to auscultation without rales, wheezing or rhonchi  ABDOMEN: Soft, non-tender, non-distended MUSCULOSKELETAL:  No edema; No deformity  SKIN: Warm and dry NEUROLOGIC:  Alert and oriented x 3 PSYCHIATRIC:  Normal affect   ASSESSMENT:    No diagnosis found. PLAN:    In order of problems listed above:  1. PAF :   CHADS2VASC is  3 ( female, HTN, DM, )    We will start her on Eliquis 5 mg twice a day. We will plan on getting an echocardiogram in the  next of weeks.  We will also check her CBC in a couple months to make sure that she is not having any occult bleeding.  She is been checked for obstructive sleep apnea and her sleep study was negative.  She does have restless leg syndrome.  She may need to have a repeat sleep study.  We have discussed the fact that she is carrying too much weight and that weight loss would definitely help prevent further episodes of atrial fibrillation. I will see her again in 3 months for follow-up visit.  For right now I do not think that she needs a referral to the A. fib clinic since she is only had one episode of A. fib that lasted for several hours.  If she starts to have recurrent episodes of atrial fibrillation we will certainly consider sending her to the A. fib clinic.  I have given her information on the Kardia event monitor by [employer].  She has been started on Toprol-XL and takes 12.5 mg a day.  She is having some fatigue but I think that she will get used to it.  2.  Obesity: Strongly encouraged her to lose weight.  I think this will be her biggest challenge.  I have advised her to restrict her intake of bread and ice cream which seem to be her weak spots.  3.  Hypokalemia: She is on HCTZ 25 mg a day as well as Micardis.  Her potassium was low in the ER several weeks ago and she is had hypokalemia in the past.  We will add potassium chloride 20 mEq a day. Recheck basic metabolic profile in 3 weeks.    Medication Adjustments/Labs and Tests Ordered: Current medicines are reviewed at length with the patient today.  Concerns regarding medicines are outlined above.  No orders of the defined types were placed in this encounter.  No orders of the defined types were placed in this encounter.   There are no Patient Instructions on file for this visit.   Signed, Mertie Moores, MD  12/15/2018 9:09 AM    [place]

## 2018-12-20 ENCOUNTER — Ambulatory Visit (HOSPITAL_COMMUNITY): Payer: BC Managed Care – PPO | Attending: Cardiology

## 2018-12-20 ENCOUNTER — Other Ambulatory Visit: Payer: Self-pay

## 2018-12-20 DIAGNOSIS — I480 Paroxysmal atrial fibrillation: Secondary | ICD-10-CM | POA: Diagnosis not present

## 2018-12-20 MED ORDER — PERFLUTREN LIPID MICROSPHERE
1.0000 mL | INTRAVENOUS | Status: AC | PRN
  Administered 2018-12-20: 2 mL via INTRAVENOUS

## 2018-12-21 DIAGNOSIS — M47816 Spondylosis without myelopathy or radiculopathy, lumbar region: Secondary | ICD-10-CM | POA: Diagnosis not present

## 2018-12-22 ENCOUNTER — Encounter: Payer: Self-pay | Admitting: Internal Medicine

## 2018-12-22 ENCOUNTER — Encounter: Payer: Self-pay | Admitting: Family Medicine

## 2018-12-22 NOTE — Telephone Encounter (Signed)
There is a good possibility the symptoms she had is not from the medication and could be separate entity in itself, since this has not occurred while she has been taking it up until now.  That being said, some of the symptoms could be caused by a drop in blood pressure. The metoprolol 12.5 mg is the lowest possible dose available of that medication.  Not taking this medication increases her risks of having an irregular heart rhythm/A. Fib. I would suggest she discuss with her cardiologist to be safe.

## 2018-12-22 NOTE — Telephone Encounter (Signed)
Pt wrote the following my chart message: Dr. Raoul Pitch,  The Metoprolol succ er 12.5mg  tab at bed time.  When I took it last night I became extremely light headed, broke out in a sweat, felt nauseated, had immediate diarrhea and almost passed out.  I can't take this medicine anymore, is there something else you want me to take or nothing at all.  My echo cardiogram is in my chart now also.  Please respond. Thanks, Debar Acha  Please advise if patient needs to come here for appt or needs to call cardiology for recommendations on medication

## 2018-12-27 DIAGNOSIS — M47816 Spondylosis without myelopathy or radiculopathy, lumbar region: Secondary | ICD-10-CM | POA: Diagnosis not present

## 2019-01-02 DIAGNOSIS — Z6841 Body Mass Index (BMI) 40.0 and over, adult: Secondary | ICD-10-CM | POA: Diagnosis not present

## 2019-01-02 DIAGNOSIS — M25562 Pain in left knee: Secondary | ICD-10-CM | POA: Diagnosis not present

## 2019-01-03 ENCOUNTER — Ambulatory Visit (INDEPENDENT_AMBULATORY_CARE_PROVIDER_SITE_OTHER): Payer: BC Managed Care – PPO | Admitting: Internal Medicine

## 2019-01-03 ENCOUNTER — Encounter: Payer: Self-pay | Admitting: Internal Medicine

## 2019-01-03 ENCOUNTER — Other Ambulatory Visit: Payer: Self-pay

## 2019-01-03 VITALS — BP 124/78 | HR 75 | Resp 16 | Ht 64.0 in | Wt 252.0 lb

## 2019-01-03 DIAGNOSIS — L649 Androgenic alopecia, unspecified: Secondary | ICD-10-CM

## 2019-01-03 DIAGNOSIS — E039 Hypothyroidism, unspecified: Secondary | ICD-10-CM

## 2019-01-03 LAB — T4, FREE: Free T4: 1.42 ng/dL (ref 0.60–1.60)

## 2019-01-03 LAB — TSH: TSH: 3.03 u[IU]/mL (ref 0.35–4.50)

## 2019-01-03 NOTE — Progress Notes (Signed)
Name: [name] [name]  MRN/ DOB: [id number], 09-22-1965    Age/ Sex: 53 y.o., female     PCP: [name], DO   Reason for Endocrinology Evaluation: Hypothyroidism     Initial Endocrinology Clinic Visit: 04/14/2018     PATIENT IDENTIFIER: [name] is a 53 y.o., female with a past medical history of HTN, RLS, Hypothyroidism, depression and dyslipidemia. She has followed with [facility] since 04/14/2018  for consultative assistance with management of her hypothyroidism   HISTORICAL SUMMARY: The patient was first diagnosed with hypothyroidism  >10 yrs ago. She was on Levothyroxine initially but switched to Nature-Throid due to difficult to control TFTs. She has been on Nature-throid for ~ 4 yrs prior to her presentation to our clinic and continues to have difficulty steadying her TSH. She was also on Cytomel for 3 months prior to her presentation without any improvement her symptoms of fatigue , hair loss or excessive sweating.   She was switched from nature-throid to Levothyroxine 04/2018  SUBJECTIVE:   During last visit (10/10/2018): We changed  Levothyroxine 150 mcg  Dose to 2 tabs on Thursday and 1 tablet rest of the week.    Today (01/03/2019):  [name] is here for concerns of hair loss that she attributes to her "thyroid " . She is not satisfied that her TSH being 2.89 uIU/mL  Pt believes this is too low for her and is the reason for her loss, she would like her TSH to be in the 4's.   Denies hair growth any where else in the body  No  Constipation or depression  Last menstruation was 10 yrs ago. She has been on estradiol and progesterone for years.   No prior diagnosis of PCOS.     She is taking 2 levothyroxine tabs on Thursday and one tablet rest of the week  She is not on biotin     ROS:  As per HPI.   HISTORY:  Past Medical History:  Past Medical History:  Diagnosis Date  . Anxiety 01/12/2013  . Arthralgia 07/08/2016  . Asthma    . Atrial fibrillation ([place]) 12/06/2018  . B12 deficiency 01/31/2018  . Chicken pox   . Chronic insomnia 11/24/2016  . Clostridium difficile infection 04/02/2014, 02/06/2014  . Complex tear of medial meniscus of left knee as current injury 12/01/2018  . DDD (degenerative disc disease), lumbar 09/09/2018  . Depression   . Diabetes mellitus without complication ([place]) [id number]  . Dyslipidemia   . Elevated hemoglobin A1c 07/08/2016  . Estrogen deficiency 12/06/2018  . Foraminal stenosis of lumbar region 09/09/2018  . Hair loss 07/20/2017  . History of lumbar fusion 09/09/2018  . Hormone replacement therapy (HRT) 07/20/2017  . HTN (hypertension)   . Hypertension 07/08/2016  . Hypokalemia 12/06/2018  . Hypothyroidism   . Left medial tibial plateau fracture 11/10/2018  . Lumbar radiculopathy 09/09/2018   Referred to NS 09/09/2018  . Migraine   . Mild memory disturbance 11/13/2013  . Morbid obesity ([place]) 07/08/2016  . Multiple allergies   . Obesity   . Restless legs syndrome (RLS) 01/12/2013   Follows with Dr. Jannifer Franklin who prescribes Requip and gabapentin  . RLS (restless legs syndrome)   . Vitamin D deficiency 12/06/2018  . Wears glasses 07/08/2016   Past Surgical History:  Past Surgical History:  Procedure Laterality Date  . BREAST BIOPSY  [id number]   x 2 benign lesion  . CERVICAL DISCECTOMY  2003  .  CERVICAL FUSION  2010   x2  . Hand fracture surgery    . LUMBAR FUSION  2016  . VAGINAL HYSTERECTOMY  2009    Social History:  reports that she has never smoked. She has never used smokeless tobacco. She reports that she does not drink alcohol or use drugs. Family History:  Family History  Problem Relation Age of Onset  . Hypertension Father   . Diabetes Father   . Heart disease Father   . Hearing loss Father   . Diabetes Brother   . Arthritis Mother   . Liver cancer Mother   . COPD Mother   . Mental illness Mother   . Diabetes Mother   . Hearing loss Mother   . Heart disease Mother       HOME MEDICATIONS: Allergies as of 01/03/2019      Reactions   Bee Venom Swelling, Anaphylaxis   Biaxin [clarithromycin] Diarrhea, Rash   Gabapentin Nausea And Vomiting   No reaction to Horizant   Abilify [aripiprazole] Other (See Comments)   MOOD SWING   Buspar [buspirone] Diarrhea   Lisinopril Cough   Maxzide [triamterene-hctz] Rash   Phentermine Other (See Comments)   MOOD CHANGE   Seroquel [quetiapine Fumarate] Other (See Comments)   MOOD SWINGS      Medication List       Accurate as of January 03, 2019  7:31 AM. If you have any questions, ask your nurse or doctor.        apixaban 5 MG Tabs tablet Commonly known as: ELIQUIS Take 1 tablet (5 mg total) by mouth 2 (two) times daily.   buPROPion 300 MG 24 hr tablet Commonly known as: WELLBUTRIN XL Take 1 tablet (300 mg total) by mouth daily.   cholecalciferol 1000 units tablet Commonly known as: VITAMIN D Take 2,000 Units by mouth daily.   DULoxetine 60 MG capsule Commonly known as: CYMBALTA Take 1 capsule (60 mg total) by mouth daily. Needs office visit prior to anymore refills.   estradiol 2 MG tablet Commonly known as: ESTRACE Take 1 tablet (2 mg total) by mouth daily.   FISH OIL PO Take 1 capsule by mouth daily.   Horizant 300 MG Tbcr Generic drug: Gabapentin Enacarbil ER TAKE 2 TABLETS BY MOUTH EVERY DAY AT 5PM WITH FOOD   Ibuprofen-Famotidine 800-26.6 MG Tabs Take 1 tablet by mouth 3 (three) times daily.   levothyroxine 150 MCG tablet Commonly known as: SYNTHROID TAKE 1 TABLET BY MOUTH EVERY DAY   magnesium oxide 400 (241.3 Mg) MG tablet Commonly known as: MAG-OX   metFORMIN 1000 MG tablet Commonly known as: GLUCOPHAGE Take 1 tablet (1,000 mg total) by mouth 2 (two) times daily with a meal.   metoprolol succinate 25 MG 24 hr tablet Commonly known as: TOPROL-XL Take 12.5 mg by mouth daily.   potassium chloride SA 20 MEQ tablet Commonly known as: KLOR-CON Take 1 tablet (20 mEq total) by  mouth daily.   progesterone 100 MG capsule Commonly known as: PROMETRIUM Take 1 capsule (100 mg total) by mouth every other day.   rOPINIRole 2 MG tablet Commonly known as: REQUIP Take 1/2 tablet at lunch and 1/2 tablet at bed time.   rOPINIRole 4 MG 24 hr tablet Commonly known as: REQUIP XL Take 1 tablet (4 mg total) by mouth daily.   telmisartan-hydrochlorothiazide 80-25 MG tablet Commonly known as: MICARDIS HCT Take 1 tablet by mouth daily.          DATA REVIEWED:  Results for LANY, DEVAUL (MRN [id number]) as of 01/03/2019 13:04  Ref. Range 01/03/2019 10:27  TSH Latest Ref Range: 0.35 - 4.50 uIU/mL 3.03  T4,Free(Direct) Latest Ref Range: 0.60 - 1.60 ng/dL 1.42     ASSESSMENT / PLAN / RECOMMENDATIONS:   1. Hypothyroidism  - Pt is clinically euthyroid  - I personally don't believe her hair loss is related to her thyroid at this time, I have reminded the patient that she has consistently been having issues with hair loss despite being on desiccated thyroid formulation and cytomel and maybe other causes should be explored.  - We could reduce her LT-4 replacement to 1.5 tabs on Thursday and 1 tablet the rest of the week to get her TSH close to 4 but I suspect she may different complaints with such a higher TSH.  - Pt educated extensively on the correct way to take levothyroxine (first thing in the morning with water, 30 minutes before eating or taking other medications). - Pt encouraged to double dose the following day if she were to miss a dose given long half-life of levothyroxine.  Medications   Levothyroxine 150 mcg , take 1.5 tablets on Thursday and one tablet rest of the week   2. Female Pattern Baldness :   - We discussed hereditary causes, stress and traction  From hair pulling and androgen causes.  - Will check testosterone and DHEAS  - Pt encouraged to seek dermatology consultation    F/U in 6 months    Signed electronically by: Mack Guise,  MD  [facility]  [address] [place], [place] [address] Phone: [phone] FAX: [phone]   CC: [name], DO [address] [place] [address] Phone: [phone]  Fax: [phone]  Return to Endocrinology clinic as below: Future Appointments  Date Time Provider [place]  01/03/2019  9:50 AM Baljit Liebert, Melanie Crazier, MD [facility] None  01/17/2019 10:15 AM [facility]  03/17/2019  8:00 AM Nahser, Wonda Cheng, MD [facility]  04/10/2019  8:10 AM Handy Mcloud, Melanie Crazier, MD [facility] None  08/15/2019  3:45 PM Suzzanne Cloud, NP [facility] None

## 2019-01-03 NOTE — Patient Instructions (Signed)
-   Please stop by the lab today  

## 2019-01-07 LAB — DHEA-SULFATE: DHEA-SO4: 84 ug/dL (ref 8–188)

## 2019-01-07 LAB — TESTOSTERONE, TOTAL, LC/MS/MS: Testosterone, Total, LC-MS-MS: 32 ng/dL (ref 2–45)

## 2019-01-13 ENCOUNTER — Ambulatory Visit
Admission: RE | Admit: 2019-01-13 | Discharge: 2019-01-13 | Disposition: A | Discharge status: Home / Self Care | Payer: BC Managed Care – PPO | Source: Ambulatory Visit | Attending: Family Medicine | Admitting: Family Medicine

## 2019-01-13 ENCOUNTER — Other Ambulatory Visit: Payer: Self-pay

## 2019-01-13 ENCOUNTER — Ambulatory Visit: Payer: BC Managed Care – PPO | Admitting: Cardiovascular Disease

## 2019-01-13 DIAGNOSIS — E559 Vitamin D deficiency, unspecified: Secondary | ICD-10-CM

## 2019-01-13 DIAGNOSIS — Z13820 Encounter for screening for osteoporosis: Secondary | ICD-10-CM | POA: Diagnosis not present

## 2019-01-13 DIAGNOSIS — S82132D Displaced fracture of medial condyle of left tibia, subsequent encounter for closed fracture with routine healing: Secondary | ICD-10-CM

## 2019-01-13 DIAGNOSIS — E079 Disorder of thyroid, unspecified: Secondary | ICD-10-CM

## 2019-01-13 DIAGNOSIS — Z780 Asymptomatic menopausal state: Secondary | ICD-10-CM | POA: Diagnosis not present

## 2019-01-13 DIAGNOSIS — E2839 Other primary ovarian failure: Secondary | ICD-10-CM

## 2019-01-15 ENCOUNTER — Other Ambulatory Visit: Payer: Self-pay | Admitting: Family Medicine

## 2019-01-15 DIAGNOSIS — F339 Major depressive disorder, recurrent, unspecified: Secondary | ICD-10-CM

## 2019-01-15 DIAGNOSIS — I10 Essential (primary) hypertension: Secondary | ICD-10-CM

## 2019-01-16 NOTE — Telephone Encounter (Signed)
Called patient and scheduled 6 month f/u.

## 2019-01-16 NOTE — Telephone Encounter (Signed)
Please call patient. I have received refill request for her bupropion/Wellbutrin and pt is due for 6 mos follow up. Please schedule ASAP.    * Of note, She was seen in October, however this was for a hospital admission follow-up in her chronic conditions.  The extended 30-day prescription for her Wellbutrin and telmisartan/HCTZ to local pharmacy if needed to allow her time to schedule appointment.

## 2019-01-17 ENCOUNTER — Other Ambulatory Visit: Payer: Self-pay

## 2019-01-17 ENCOUNTER — Other Ambulatory Visit: Payer: BC Managed Care – PPO

## 2019-01-17 DIAGNOSIS — I480 Paroxysmal atrial fibrillation: Secondary | ICD-10-CM | POA: Diagnosis not present

## 2019-01-17 DIAGNOSIS — M47816 Spondylosis without myelopathy or radiculopathy, lumbar region: Secondary | ICD-10-CM | POA: Diagnosis not present

## 2019-01-17 DIAGNOSIS — Z9189 Other specified personal risk factors, not elsewhere classified: Secondary | ICD-10-CM | POA: Diagnosis not present

## 2019-01-17 LAB — BASIC METABOLIC PANEL
BUN/Creatinine Ratio: 28 — ABNORMAL HIGH (ref 9–23)
BUN: 17 mg/dL (ref 6–24)
CO2: 22 mmol/L (ref 20–29)
Calcium: 9.7 mg/dL (ref 8.7–10.2)
Chloride: 99 mmol/L (ref 96–106)
Creatinine, Ser: 0.61 mg/dL (ref 0.57–1.00)
GFR calc Af Amer: 121 mL/min/{1.73_m2} (ref >59)
GFR calc non Af Amer: 105 mL/min/{1.73_m2} (ref >59)
Glucose: 100 mg/dL — ABNORMAL HIGH (ref 65–99)
Potassium: 4 mmol/L (ref 3.5–5.2)
Sodium: 139 mmol/L (ref 134–144)

## 2019-01-17 LAB — CBC
Hematocrit: 38.7 % (ref 34.0–46.6)
Hemoglobin: 13.4 g/dL (ref 11.1–15.9)
MCH: 30.4 pg (ref 26.6–33.0)
MCHC: 34.6 g/dL (ref 31.5–35.7)
MCV: 88 fL (ref 79–97)
Platelets: 329 10*3/uL (ref 150–450)
RBC: 4.41 x10E6/uL (ref 3.77–5.28)
RDW: 12.4 % (ref 11.7–15.4)
WBC: 11.4 10*3/uL — ABNORMAL HIGH (ref 3.4–10.8)

## 2019-01-18 ENCOUNTER — Ambulatory Visit (INDEPENDENT_AMBULATORY_CARE_PROVIDER_SITE_OTHER): Payer: BC Managed Care – PPO | Admitting: Family Medicine

## 2019-01-18 ENCOUNTER — Encounter: Payer: Self-pay | Admitting: Family Medicine

## 2019-01-18 VITALS — BP 124/78 | HR 82 | Temp 98.6°F | Resp 19 | Ht 64.0 in | Wt 253.2 lb

## 2019-01-18 DIAGNOSIS — L659 Nonscarring hair loss, unspecified: Secondary | ICD-10-CM | POA: Diagnosis not present

## 2019-01-18 DIAGNOSIS — F339 Major depressive disorder, recurrent, unspecified: Secondary | ICD-10-CM | POA: Diagnosis not present

## 2019-01-18 DIAGNOSIS — I10 Essential (primary) hypertension: Secondary | ICD-10-CM | POA: Diagnosis not present

## 2019-01-18 DIAGNOSIS — D72829 Elevated white blood cell count, unspecified: Secondary | ICD-10-CM

## 2019-01-18 DIAGNOSIS — G2581 Restless legs syndrome: Secondary | ICD-10-CM | POA: Diagnosis not present

## 2019-01-18 DIAGNOSIS — E559 Vitamin D deficiency, unspecified: Secondary | ICD-10-CM

## 2019-01-18 MED ORDER — TELMISARTAN-HCTZ 80-25 MG PO TABS: 1.0000 | ORAL_TABLET | Freq: Every day | ORAL | 1 refills | Status: DC

## 2019-01-18 MED ORDER — METOPROLOL SUCCINATE ER 25 MG PO TB24: 12.5000 mg | ORAL_TABLET | Freq: Every day | ORAL | 1 refills | Status: DC

## 2019-01-18 MED ORDER — MELOXICAM 15 MG PO TABS: 15.0000 mg | ORAL_TABLET | Freq: Every day | ORAL | 3 refills | Status: DC

## 2019-01-18 MED ORDER — DULOXETINE HCL 60 MG PO CPEP: 60.0000 mg | ORAL_CAPSULE | Freq: Every day | ORAL | 1 refills | Status: DC

## 2019-01-18 MED ORDER — ESTRADIOL 2 MG PO TABS: 2.0000 mg | ORAL_TABLET | Freq: Every day | ORAL | 2 refills | Status: DC

## 2019-01-18 NOTE — Patient Instructions (Signed)
We will call you with lab results. I have refilled your medications.  They will call you to get you on the schedule  in about 5 months.   Have a great holiday.

## 2019-01-18 NOTE — Progress Notes (Signed)
Patient Care Team    Relationship Specialty Notifications Start End  [name], DO PCP - General Family Medicine  07/07/16   Nahser, Wonda Cheng, MD PCP - Cardiology Cardiology Admissions 12/15/18   Ob/Gyn, [name]    07/08/16   Wilford Corner, MD Consulting Physician Gastroenterology  07/08/16   [name], MD Consulting Physician Neurology  07/08/16   [name], MD Attending Physician Endocrinology  07/08/16    Comment: Pt established with Si Raider, NP at this location. - Integrative med- thyroid    SUBJECTIVE Chief Complaint  Patient presents with   Hypertension    Pt is doing well with no complaints. Pt checks blood pressure at home at random and has been WNL    HPI: [name] [name] is a 53 y.o. female present for [facility] follow up.  Recurrent major depressive disorder, remission status unspecified (HCC) Reports  compliance with Wellbutrin 300 mg daily and Cymbalta 60 mg daily. She is tolerating these medications well without any side effects.    Reports she is doing well.  Arthralgia, unspecified joint Tolerating meloxicam.  She reports increase in her restless leg symptoms.  She is prescribed Requip for this condition and is under the care of neurology.  Hypertension/Morbid obesity/elevated a1c:Pt reports  compliance withmicardis80-25 milligrams.Patient denies chest pain, shortness of breath, dizziness or lower extremity edema. .  ASA recommended.  Pt is notprescribed statin. BMP: 01/06/2017 within normal limits CBC: 01/06/2017 within normal limits Lipids: 04/03/2015 within normal limits Diet/exercise does not routinely watch diet and exercise [id number], obesity,fhxheart disease  Hypothyroidism/Vit D def/HRT:  Thyroid is now managed by endocrine and she is taking levothyroxine 150 mcg QD-her levels were in normal range when last checked.  She reports she is still having hair loss issues.  She reports compliance with Estrace and progesterone and  her mammogram is up-to-date 09/14/2018. Prior note:  Pt presents for an OV with complaints of increased hair loss since stopping the progesterone.  Progesterone was stopped secondary to she has had a hysterectomy and she has gained weight.  Last TSH was 1 year ago and normal.  She reports compliance with Armour thyroid 90 mg.  She states she was forced to get Armour Thyroid because nature thyroid is too difficult to obtain.  She was tried on levothyroxine in the past, and reports she was taken off that medication but she does not remember why.  She is wondering if she can try different thyroid medicine. ROS: See pertinent positives and negatives per HPI.  Patient Active Problem List   Diagnosis Date Noted   Diabetes mellitus without complication ([place]) [id number]   Hypokalemia 12/06/2018   Atrial fibrillation ([place]) 12/06/2018   Vitamin D deficiency 12/06/2018   Estrogen deficiency 12/06/2018   Complex tear of medial meniscus of left knee as current injury 12/01/2018   Left medial tibial plateau fracture 11/10/2018   DDD (degenerative disc disease), lumbar 09/09/2018   Foraminal stenosis of lumbar region 09/09/2018   History of lumbar fusion 09/09/2018   Lumbar radiculopathy 09/09/2018   B12 deficiency 01/31/2018   Hormone replacement therapy (HRT) 07/20/2017   Hair loss 07/20/2017   Chronic insomnia 11/24/2016   Elevated hemoglobin A1c 07/08/2016   Hypertension 07/08/2016   Arthralgia 07/08/2016   Morbid obesity ([place]) 07/08/2016   Wears glasses 07/08/2016   Hypothyroidism    Dyslipidemia    Depression    Mild memory disturbance 11/13/2013   Restless legs syndrome (RLS) 01/12/2013    Social  History   Tobacco Use   Smoking status: Never Smoker   Smokeless tobacco: Never Used  Substance Use Topics   Alcohol use: No    Alcohol/week: 0.0 standard drinks    Current Outpatient Medications:    apixaban (ELIQUIS) 5 MG TABS tablet, Take 1 tablet (5 mg  total) by mouth 2 (two) times daily., Disp: 60 tablet, Rfl: 11   buPROPion (WELLBUTRIN XL) 300 MG 24 hr tablet, Take 1 tablet (300 mg total) by mouth daily., Disp: 90 tablet, Rfl: 1   cholecalciferol (VITAMIN D) 1000 units tablet, Take 5,000 Units by mouth daily. , Disp: , Rfl:    DULoxetine (CYMBALTA) 60 MG capsule, Take 1 capsule (60 mg total) by mouth daily. Needs office visit prior to anymore refills., Disp: 90 capsule, Rfl: 1   estradiol (ESTRACE) 2 MG tablet, Take 1 tablet (2 mg total) by mouth daily., Disp: 90 tablet, Rfl: 2   HORIZANT 300 MG TBCR, TAKE 2 TABLETS BY MOUTH EVERY DAY AT 5PM WITH FOOD, Disp: 60 tablet, Rfl: 6   levothyroxine (SYNTHROID) 150 MCG tablet, TAKE 1 TABLET BY MOUTH EVERY DAY, Disp: 90 tablet, Rfl: 1   magnesium oxide (MAG-OX) 400 (241.3 Mg) MG tablet, , Disp: , Rfl:    meloxicam (MOBIC) 15 MG tablet, Take 15 mg by mouth daily., Disp: , Rfl:    metFORMIN (GLUCOPHAGE) 1000 MG tablet, Take 1 tablet (1,000 mg total) by mouth 2 (two) times daily with a meal., Disp: 180 tablet, Rfl: 1   metoprolol succinate (TOPROL-XL) 25 MG 24 hr tablet, Take 12.5 mg by mouth daily. , Disp: , Rfl:    Omega-3 Fatty Acids (FISH OIL PO), Take 1 capsule by mouth daily., Disp: , Rfl:    potassium chloride SA (KLOR-CON) 20 MEQ tablet, Take 1 tablet (20 mEq total) by mouth daily., Disp: 90 tablet, Rfl: 3   progesterone (PROMETRIUM) 100 MG capsule, Take 1 capsule (100 mg total) by mouth every other day., Disp: 45 capsule, Rfl: 3   rOPINIRole (REQUIP XL) 4 MG 24 hr tablet, Take 1 tablet (4 mg total) by mouth daily., Disp: 90 tablet, Rfl: 3   rOPINIRole (REQUIP) 2 MG tablet, Take 1/2 tablet at lunch and 1/2 tablet at bed time., Disp: 90 tablet, Rfl: 3   telmisartan-hydrochlorothiazide (MICARDIS HCT) 80-25 MG tablet, Take 1 tablet by mouth daily., Disp: 90 tablet, Rfl: 1  Allergies  Allergen Reactions   Bee Venom Swelling and Anaphylaxis   Biaxin [Clarithromycin] Diarrhea and  Rash   Gabapentin Nausea And Vomiting    No reaction to Horizant   Abilify [Aripiprazole] Other (See Comments)    MOOD SWING   Buspar [Buspirone] Diarrhea   Lisinopril Cough   Maxzide [Triamterene-Hctz] Rash   Phentermine Other (See Comments)    MOOD CHANGE    Seroquel [Quetiapine Fumarate] Other (See Comments)    MOOD SWINGS    OBJECTIVE: BP 124/78 (BP Location: Left Arm, Patient Position: Sitting, Cuff Size: Large)    Pulse 82    Temp 98.6 F (37 C) (Temporal)    Resp 19    Ht 5\' 4"  (1.626 m)    Wt 253 lb 4 oz (114.9 kg)    SpO2 96%    BMI 43.47 kg/m  Gen: Afebrile. No acute distress.  HENT: AT. [place].  Eyes:Pupils Equal Round Reactive to light, Extraocular movements intact,  Conjunctiva without redness, discharge or icterus. Neck/lymp/endocrine: Supple,no lymphadenopathy, no thyromegaly CV: RRR no murmur, no edema, +2/4 P posterior tibialis pulses  Chest: CTAB, no wheeze or crackles Abd: Soft. NTND. BS present. no Masses palpated.  Skin: no rashes, purpura or petechiae.  Neuro:  Normal gait. PERLA. EOMi. Alert. Oriented x3 Psych: Normal affect, dress and demeanor. Normal speech. Normal thought content and judgment.   Depression screen [facility] 2/9 07/27/2018 10/08/2017 05/03/2017 01/06/2017 07/07/2016  Decreased Interest 0 0 0 0 0  Down, Depressed, Hopeless 0 0 0 0 0  PHQ - 2 Score 0 0 0 0 0  Altered sleeping 3 3 - 3 -  Tired, decreased energy 3 3 - 0 -  Change in appetite 3 1 - 0 -  Feeling bad or failure about yourself  0 0 - 0 -  Trouble concentrating 0 - - 0 -  Moving slowly or fidgety/restless 0 0 - 0 -  Suicidal thoughts 0 0 - 0 -  PHQ-9 Score 9 7 - 3 -  Difficult doing work/chores Not difficult at all Somewhat difficult - Not difficult at all -    ASSESSMENT AND PLAN: [name] [name] is a 52 y.o. female present for  hypothyroidism - now managed by endocrine and taking levothyroxine 150 mcg QD. Recurrent major depressive disorder, remission status unspecified  (HCC) Stable.  Continue and refilled  Cymbalta 60 mg daily and Wellbutrin 300 mg daily - DULoxetine (CYMBALTA) 60 MG capsule; Take 1 capsule (60 mg total) by mouth daily. Needs office visit prior to anymore refills.  Dispense: 30 capsule; Refill: 0 - buPROPion (WELLBUTRIN XL) 300 MG 24 hr tablet; Take 1 tablet (300 mg total) by mouth daily.  Dispense: 90 tablet; Refill: 1 -Follow-up 6 months Hormone replacement therapy (HRT) Stable.  Refills provided on estrogen and progesterone supplementation.  Mammogram up-to-date 08/2018. Follow-up 6 months Arthralgia, unspecified joint Stable.  With increase in restless leg-like symptoms.  Refills provided on Mobic. -Rule out iron deficiency.  Iron panel ordered - meloxicam (MOBIC) 15 MG tablet; Take 1 tablet (15 mg total) by mouth daily.  Dispense: 90 tablet; Refill: 1 Vitamin D deficiency: -Patient is taking 5000 units daily.  We will recheck levels today. Essential hypertension/morbid obesity Stable.  Continue micardis - CBC collected today. Low sodium.  - telmisartan-hydrochlorothiazide (MICARDIS HCT) 80-25 MG tablet; Take 1 tablet by mouth daily.  Dispense: 90 tablet; Refill: 1  F/u 6 mos.  Orders Placed This Encounter  Procedures   Vitamin D (25 hydroxy)   Iron, TIBC and Ferritin Panel   CBC w/Diff   This visit occurred during the SARS-CoV-2 public health emergency.  Safety protocols were in place, including screening questions prior to the visit, additional usage of staff PPE, and extensive cleaning of exam room while observing appropriate contact time as indicated for disinfecting solutions.    > 25 minutes spent with patient, >50% of time spent face to face counseling and coordinating care.     Howard Pouch, DO 01/18/2019

## 2019-01-20 LAB — CBC WITH DIFFERENTIAL/PLATELET
Absolute Monocytes: 631 cells/uL (ref 200–950)
Basophils Absolute: 54 cells/uL (ref 0–200)
Basophils Relative: 0.5 %
Eosinophils Absolute: 139 cells/uL (ref 15–500)
Eosinophils Relative: 1.3 %
HCT: 40.2 % (ref 35.0–45.0)
Hemoglobin: 13.9 g/dL (ref 11.7–15.5)
Lymphs Abs: 3392 cells/uL (ref 850–3900)
MCH: 30.6 pg (ref 27.0–33.0)
MCHC: 34.6 g/dL (ref 32.0–36.0)
MCV: 88.5 fL (ref 80.0–100.0)
MPV: 10.8 fL (ref 7.5–12.5)
Monocytes Relative: 5.9 %
Neutro Abs: 6484 cells/uL (ref 1500–7800)
Neutrophils Relative %: 60.6 %
Platelets: 334 10*3/uL (ref 140–400)
RBC: 4.54 10*6/uL (ref 3.80–5.10)
RDW: 13 % (ref 11.0–15.0)
Total Lymphocyte: 31.7 %
WBC: 10.7 10*3/uL (ref 3.8–10.8)

## 2019-01-20 LAB — IRON,TIBC AND FERRITIN PANEL
%SAT: 17 % (calc) (ref 16–45)
Ferritin: 220 ng/mL (ref 16–232)
Iron: 58 ug/dL (ref 45–160)
TIBC: 338 mcg/dL (calc) (ref 250–450)

## 2019-01-20 LAB — VITAMIN D 25 HYDROXY (VIT D DEFICIENCY, FRACTURES): Vit D, 25-Hydroxy: 36 ng/mL (ref 30–100)

## 2019-01-23 ENCOUNTER — Other Ambulatory Visit: Payer: Self-pay | Admitting: Family Medicine

## 2019-01-23 DIAGNOSIS — F339 Major depressive disorder, recurrent, unspecified: Secondary | ICD-10-CM

## 2019-01-23 MED ORDER — BUPROPION HCL ER (XL) 300 MG PO TB24: 300.0000 mg | ORAL_TABLET | Freq: Every day | ORAL | 1 refills | Status: DC

## 2019-01-24 ENCOUNTER — Encounter: Payer: Self-pay | Admitting: Family Medicine

## 2019-02-09 ENCOUNTER — Other Ambulatory Visit: Payer: Self-pay

## 2019-02-09 MED ORDER — PROGESTERONE MICRONIZED 100 MG PO CAPS: 100.0000 mg | ORAL_CAPSULE | ORAL | 3 refills | Status: DC

## 2019-02-14 DIAGNOSIS — M47816 Spondylosis without myelopathy or radiculopathy, lumbar region: Secondary | ICD-10-CM | POA: Diagnosis not present

## 2019-02-16 ENCOUNTER — Other Ambulatory Visit: Payer: Self-pay | Admitting: Neurology

## 2019-03-06 ENCOUNTER — Encounter: Payer: Self-pay | Admitting: Family Medicine

## 2019-03-06 ENCOUNTER — Ambulatory Visit (INDEPENDENT_AMBULATORY_CARE_PROVIDER_SITE_OTHER): Payer: 59 | Admitting: Family Medicine

## 2019-03-06 ENCOUNTER — Other Ambulatory Visit: Payer: Self-pay

## 2019-03-06 VITALS — BP 125/78 | HR 80 | Temp 97.8°F | Resp 17 | Ht 64.0 in | Wt 252.2 lb

## 2019-03-06 DIAGNOSIS — R350 Frequency of micturition: Secondary | ICD-10-CM

## 2019-03-06 DIAGNOSIS — M5136 Other intervertebral disc degeneration, lumbar region: Secondary | ICD-10-CM | POA: Diagnosis not present

## 2019-03-06 DIAGNOSIS — M48061 Spinal stenosis, lumbar region without neurogenic claudication: Secondary | ICD-10-CM | POA: Diagnosis not present

## 2019-03-06 LAB — POC URINALSYSI DIPSTICK (AUTOMATED)
Bilirubin, UA: NEGATIVE
Blood, UA: NEGATIVE
Glucose, UA: NEGATIVE
Ketones, UA: NEGATIVE
Leukocytes, UA: NEGATIVE
Nitrite, UA: NEGATIVE
Protein, UA: NEGATIVE
Spec Grav, UA: >=1.03 — AB (ref 1.010–1.025)
Urobilinogen, UA: 0.2 E.U./dL
pH, UA: 5.5 (ref 5.0–8.0)

## 2019-03-06 MED ORDER — PREDNISONE 20 MG PO TABS: ORAL_TABLET | ORAL | 0 refills | Status: DC

## 2019-03-06 MED ORDER — BACLOFEN 10 MG PO TABS: 10.0000 mg | ORAL_TABLET | Freq: Three times a day (TID) | ORAL | 0 refills | Status: DC

## 2019-03-06 NOTE — Progress Notes (Signed)
This visit occurred during the SARS-CoV-2 public health emergency.  Safety protocols were in place, including screening questions prior to the visit, additional usage of staff PPE, and extensive cleaning of exam room while observing appropriate contact time as indicated for disinfecting solutions.    [name] [name] , 03-31-1965, 54 y.o., female MRN: [id number] Patient Care Team    Relationship Specialty Notifications Start End  [name], DO PCP - General Family Medicine  07/07/16   Nahser, Wonda Cheng, MD PCP - Cardiology Cardiology Admissions 12/15/18   Ob/Gyn, [name]    07/08/16   Wilford Corner, MD Consulting Physician Gastroenterology  07/08/16   [name], MD Consulting Physician Neurology  07/08/16   [name], MD Attending Physician Endocrinology  07/08/16    Comment: Pt established with Si Raider, NP at this location. - Integrative med- thyroid  [name], MD Consulting Physician Rehabilitation  03/06/19    Comment: Neurosurgeon-spine and scoliosis specialist    Chief Complaint  Patient presents with  . Back Pain    Lower right back pain x2 weeks. Has gotten worse and is a constant stabbing pain per patient. No injury.   . Urinary Frequency    No pain when urinating, no blood, no odor, denies fever     Subjective: Pt presents for an OV with complaints of pain of her right lower back of 2 weeks  duration.  She denies known injury or overactivity.  She describes the pain as a sharp stabbing pain with movement.  She is able to get comfortable when sitting, but as soon as she moves or takes a deep breath she feels a stabbing sensation located over her low right side back without radiation.  Patient has a history of lumbar fusion L2-3.  There has been noted mild progression of disc space narrowing at the other levels of the lumbar spine visualized on x-ray 11/2017 when she presented with similar symptoms.  Overall her lower back discomfort has been well  controlled since October 2019.  At that time she was prescribed a round of steroid and a muscle relaxer.  She states she did not take the Flexeril at that time secondary to sedation.  MRI completed September 02, 2018 also resulted with progressed adjacent segment disease L3-L4 with degenerative appearing marrow edema and multifactorial moderate to severe spinal right lateral recess and moderate bilateral foraminal stenosis.  Interval posterior decompression at L4-L5 with regressed but not resolved spinal lateral recess and foraminal stenosis at that level.  Increased mild to moderate neuroforaminal stenosis L5-S1.  Patient was referred to neurosurgery at that time and established with spine and scoliosis specialist Dr. Maia Petties and underwent a medial branch blockage on 09/27/2018. She also complains of urinary frequency of the last 2 days. She denies dysuria, fever, chills, nausea, vomit. Her BM have been normal. Her UOP is normal.  Pt has tried mobic to ease their symptoms. She is prescribed this daily for her arthritis and she does not feel it was helpful.   MRI lumbar spine 09/02/2018: IMPRESSION: 1. Chronic interbody and interspinous implants at L2-L3. 2. 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 3. Interval posterior decompression at L4-L5 with regressed but not resolved spinal, lateral recess and foraminal stenosis at that level. 4. Increased mild to moderate neural foraminal stenosis at L5-S1. Lumbar xray 11/2017: IMPRESSION: 1. When compared  to prior examination, there has been mild progression of disc space narrowing at the L1-2, L3-4 and L4-5 level. 2. Prior fusion L2-3 disc space. Prior L2-3 inter spinous fusion. 3. Moderate to marked L3-4 disc space narrowing. 4. Moderate L1-2 and L4-5 disc space narrowing. 5. Mild L5-S1 disc  space narrowing.  Depression screen [facility] 2/9 07/27/2018 10/08/2017 05/03/2017 01/06/2017 07/07/2016  Decreased Interest 0 0 0 0 0  Down, Depressed, Hopeless 0 0 0 0 0  PHQ - 2 Score 0 0 0 0 0  Altered sleeping 3 3 - 3 -  Tired, decreased energy 3 3 - 0 -  Change in appetite 3 1 - 0 -  Feeling bad or failure about yourself  0 0 - 0 -  Trouble concentrating 0 - - 0 -  Moving slowly or fidgety/restless 0 0 - 0 -  Suicidal thoughts 0 0 - 0 -  PHQ-9 Score 9 7 - 3 -  Difficult doing work/chores Not difficult at all Somewhat difficult - Not difficult at all -    Allergies  Allergen Reactions  . Bee Venom Swelling and Anaphylaxis  . Biaxin [Clarithromycin] Diarrhea and Rash  . Gabapentin Nausea And Vomiting    No reaction to Horizant  . Abilify [Aripiprazole] Other (See Comments)    MOOD SWING  . Buspar [Buspirone] Diarrhea  . Lisinopril Cough  . Maxzide [Triamterene-Hctz] Rash  . Phentermine Other (See Comments)    MOOD CHANGE   . Seroquel [Quetiapine Fumarate] Other (See Comments)    MOOD SWINGS   Social History   Social History Narrative   Patient is married [name])  2 children [name] and [place])   Patient is right handed.   Patient has a college education. 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    Patient drinks 1 cups daily. Uses herbal remedies, takes a daily vitamin.   Wears her seatbelt, smoke detector in the home.   Past Medical History:  Diagnosis Date  . Anxiety 01/12/2013  . Arthralgia 07/08/2016  . Asthma   . Atrial fibrillation ([place]) 12/06/2018  . B12 deficiency 01/31/2018  . Chicken pox   . Chronic insomnia 11/24/2016  . Clostridium difficile infection 04/02/2014, 02/06/2014  . Complex tear of medial meniscus of left knee as current injury 12/01/2018  . DDD (degenerative disc disease), lumbar 09/09/2018  . Depression   . Diabetes mellitus without complication ([place]) [id number]  . Dyslipidemia   . Elevated hemoglobin A1c 07/08/2016  . Estrogen deficiency 12/06/2018  . Foraminal  stenosis of lumbar region 09/09/2018  . Hair loss 07/20/2017  . History of lumbar fusion 09/09/2018  . Hormone replacement therapy (HRT) 07/20/2017  . HTN (hypertension)   . Hypertension 07/08/2016  . Hypokalemia 12/06/2018  . Hypothyroidism   . Left medial tibial plateau fracture 11/10/2018  . Lumbar radiculopathy 09/09/2018   Referred to NS 09/09/2018  . Migraine   . Mild memory disturbance 11/13/2013  . Morbid obesity ([place]) 07/08/2016  . Multiple allergies   . Obesity   . Restless legs syndrome (RLS) 01/12/2013   Follows with Dr. Jannifer Franklin who prescribes Requip and gabapentin  . RLS (restless legs syndrome)   . Vitamin D deficiency 12/06/2018  . Wears glasses 07/08/2016   Past Surgical History:  Procedure Laterality Date  . BREAST BIOPSY  [id number]   x 2 benign lesion  . CERVICAL DISCECTOMY  2003  . CERVICAL FUSION  2010   x2  . Hand fracture surgery    . LUMBAR FUSION  2016  .  VAGINAL HYSTERECTOMY  2009   Family History  Problem Relation Age of Onset  . Hypertension Father   . Diabetes Father   . Heart disease Father   . Hearing loss Father   . Diabetes Brother   . Arthritis Mother   . Liver cancer Mother   . COPD Mother   . Mental illness Mother   . Diabetes Mother   . Hearing loss Mother   . Heart disease Mother    Allergies as of 03/06/2019      Reactions   Bee Venom Swelling, Anaphylaxis   Biaxin [clarithromycin] Diarrhea, Rash   Gabapentin Nausea And Vomiting   No reaction to Horizant   Abilify [aripiprazole] Other (See Comments)   MOOD SWING   Buspar [buspirone] Diarrhea   Lisinopril Cough   Maxzide [triamterene-hctz] Rash   Phentermine Other (See Comments)   MOOD CHANGE   Seroquel [quetiapine Fumarate] Other (See Comments)   MOOD SWINGS      Medication List       Accurate as of March 06, 2019 12:14 PM. If you have any questions, ask your nurse or doctor.        apixaban 5 MG Tabs tablet Commonly known as: ELIQUIS Take 1 tablet (5 mg total) by mouth 2  (two) times daily.   baclofen 10 MG tablet Commonly known as: LIORESAL Take 1 tablet (10 mg total) by mouth 3 (three) times daily. Started by: Howard Pouch, DO   buPROPion 300 MG 24 hr tablet Commonly known as: WELLBUTRIN XL Take 1 tablet (300 mg total) by mouth daily.   cholecalciferol 1000 units tablet Commonly known as: VITAMIN D Take 5,000 Units by mouth daily.   DULoxetine 60 MG capsule Commonly known as: CYMBALTA Take 1 capsule (60 mg total) by mouth daily. Needs office visit prior to anymore refills.   estradiol 2 MG tablet Commonly known as: ESTRACE Take 1 tablet (2 mg total) by mouth daily.   FISH OIL PO Take 1 capsule by mouth daily.   Horizant 300 MG Tbcr Generic drug: Gabapentin Enacarbil ER TAKE 2 TABLETS BY MOUTH EVERY DAY AT 5PM WITH FOOD   levothyroxine 150 MCG tablet Commonly known as: SYNTHROID TAKE 1 TABLET BY MOUTH EVERY DAY   magnesium oxide 400 (241.3 Mg) MG tablet Commonly known as: MAG-OX   meloxicam 15 MG tablet Commonly known as: MOBIC Take 1 tablet (15 mg total) by mouth daily.   metFORMIN 1000 MG tablet Commonly known as: GLUCOPHAGE Take 1 tablet (1,000 mg total) by mouth 2 (two) times daily with a meal.   metoprolol succinate 25 MG 24 hr tablet Commonly known as: TOPROL-XL Take 0.5 tablets (12.5 mg total) by mouth daily.   potassium chloride SA 20 MEQ tablet Commonly known as: KLOR-CON Take 1 tablet (20 mEq total) by mouth daily.   predniSONE 20 MG tablet Commonly known as: DELTASONE 60 mg x3d, 40 mg x3d, 20 mg x2d, 10 mg x2d Started by: Howard Pouch, DO   progesterone 100 MG capsule Commonly known as: PROMETRIUM Take 1 capsule (100 mg total) by mouth every other day.   rOPINIRole 2 MG tablet Commonly known as: REQUIP Take 1/2 tablet at lunch and 1/2 tablet at bed time.   rOPINIRole 4 MG 24 hr tablet Commonly known as: REQUIP XL Take 1 tablet (4 mg total) by mouth daily.   telmisartan-hydrochlorothiazide 80-25 MG  tablet Commonly known as: MICARDIS HCT Take 1 tablet by mouth daily.       All  past medical history, surgical history, allergies, family history, immunizations andmedications were updated in the EMR today and reviewed under the history and medication portions of their EMR.     ROS: Negative, with the exception of above mentioned in HPI   Objective:  BP 125/78 (BP Location: Right Arm, Patient Position: Sitting, Cuff Size: Normal)   Pulse 80   Temp 97.8 F (36.6 C) (Temporal)   Resp 17   Ht 5\' 4"  (1.626 m)   Wt 252 lb 4 oz (114.4 kg)   SpO2 98%   BMI 43.30 kg/m  Body mass index is 43.3 kg/m. Gen: Afebrile. No acute distress. Nontoxic in appearance, well developed, well nourished.  HENT: AT. [place].  Eyes:Pupils Equal Round Reactive to light, Extraocular movements intact,  Conjunctiva without redness, discharge or icterus..  MSK: No erythema of the lower lumbar spine, no soft tissue swelling.  Vertical lumbar fusion scar present.  Tender palpation right lumbar paraspinal L3-L5 and over right side of sacrum.  Discomfort with right side bending.  Muscle strength 5/5 bilateral lower extremities.  DTRs equal bilaterally. Neuro: Normal gait. PERLA. EOMi. Alert. Oriented x3   No exam data present No results found. Results for orders placed or performed in visit on 03/06/19 (from the past 24 hour(s))  POCT Urinalysis Dipstick (Automated)     Status: Abnormal   Collection Time: 03/06/19 10:49 AM  Result Value Ref Range   Color, UA yellow    Clarity, UA clear    Glucose, UA Negative Negative   Bilirubin, UA negative    Ketones, UA negative    Spec Grav, UA >=1.030 (A) 1.010 - 1.025   Blood, UA negative    pH, UA 5.5 5.0 - 8.0   Protein, UA Negative Negative   Urobilinogen, UA 0.2 0.2 or 1.0 E.U./dL   Nitrite, UA negative    Leukocytes, UA Negative Negative    Assessment/Plan: [name] [name] is a 54 y.o. female present for OV for  Urinary frequency -Patient does not feel she is  completely emptying her bladder.  Her urine appears normal today we will send for urine culture to be certain.  Could be secondary to her history of lumbar fusion and chronic low back discomfort. If culture indicates we will provide antibiotic treatment for patient after results. - Urine Culture> sent - POCT Urinalysis Dipstick (Automated)> appears normal today  DDD (degenerative disc disease), lumbar/Foraminal stenosis of lumbar region Lower lumbar/sacral discomfort on exam today.  Patient has known lumbar fusion and worsening degenerative disease of her lumbar spine.  She has been established with nerve blockade at spine scoliosis specialist July/2020. Discussed treating for possible acute flare with prednisone and baclofen today.  If symptoms do not improve over the next 2-4 weeks or worsening I would encourage her to follow with her neurosurgery team.  Patient reports understanding.   Reviewed expectations re: course of current medical issues.  Discussed self-management of symptoms.  Outlined signs and symptoms indicating need for more acute intervention.  Patient verbalized understanding and all questions were answered.  Patient received an After-Visit Summary.    Orders Placed This Encounter  Procedures  . Urine Culture  . POCT Urinalysis Dipstick (Automated)   Meds ordered this encounter  Medications  . predniSONE (DELTASONE) 20 MG tablet    Sig: 60 mg x3d, 40 mg x3d, 20 mg x2d, 10 mg x2d    Dispense:  21 tablet    Refill:  0  . baclofen (LIORESAL) 10 MG tablet  Sig: Take 1 tablet (10 mg total) by mouth 3 (three) times daily.    Dispense:  30 each    Refill:  0     Note is dictated utilizing voice recognition software. Although note has been proof read prior to signing, occasional typographical errors still can be missed. If any questions arise, please do not hesitate to call for verification.   electronically signed by:  Howard Pouch, DO  [place]

## 2019-03-06 NOTE — Patient Instructions (Signed)
I have called in muscle relaxer baclofen and prednisone taper.  Use these as directed on the label and heating pad for comfort.   Your urine did not appear infectious or have blood. Will send it for culture to be certain. We will call you with those results and start medication if needed.   If pain is not showing some signs of improvement or if worsening, follow up in 2-4 weeks.

## 2019-03-07 LAB — URINE CULTURE
MICRO NUMBER:: 10004041
SPECIMEN QUALITY:: ADEQUATE

## 2019-03-17 ENCOUNTER — Other Ambulatory Visit: Payer: Self-pay

## 2019-03-17 ENCOUNTER — Encounter: Payer: Self-pay | Admitting: Cardiovascular Disease

## 2019-03-17 ENCOUNTER — Ambulatory Visit (INDEPENDENT_AMBULATORY_CARE_PROVIDER_SITE_OTHER): Payer: 59 | Admitting: Cardiovascular Disease

## 2019-03-17 VITALS — BP 126/72 | HR 82 | Ht 64.0 in | Wt 254.4 lb

## 2019-03-17 DIAGNOSIS — I10 Essential (primary) hypertension: Secondary | ICD-10-CM | POA: Diagnosis not present

## 2019-03-17 DIAGNOSIS — I480 Paroxysmal atrial fibrillation: Secondary | ICD-10-CM | POA: Diagnosis not present

## 2019-03-17 NOTE — Progress Notes (Signed)
Cardiology Office Note:    Date:  03/17/2019   ID:  [name] [name], DOB September 10, 1965, MRN [id number]  PCP:  [name], DO  Cardiologist:   Kabao Leite  Electrophysiologist:  None   Referring MD: [name], DO   No chief complaint on file.    History of Present Illness:    [name] [name] is a 54 y.o. female with a hx of hypertension, hypothyroidism, hypokalemia, diabetes mellitus.  She was down in [place] visiting family.  She was just getting ready to go to bed and felt her heart started to race.  It was associated with chest pain.  She tried to calm herself down to convert her heart back to normal but was unsuccessful.  EMS was called.  They found her to be in atrial fibrillation with a rapid ventricular response.  [facility] was taken to [facility] local [facility].  She had converted to sinus rhythm by the time she arrived and was hooked up to the EKG machine.  She was found to have hypokalemia.  Has a fairly constant upper left chest pain that has been present since this episode.   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 15, 2021:  2 weeks ago had an episode of slurred speech, became very hot,  Flushed,  Felt an adrenaline rush. Resolved after 10 min.  No neuro deficits. Has asked her to contact her primary MD Wt is 254 lbs She cut her toprol to 1/2 tab a day for slow HR and low bP  Is not exercising but she she did join a gym   Past Medical History:  Diagnosis Date  . Anxiety 01/12/2013  . Arthralgia 07/08/2016  . Asthma   . Atrial fibrillation ([place]) 12/06/2018  . B12 deficiency 01/31/2018  . Chicken pox   . Chronic insomnia 11/24/2016  . Clostridium difficile infection 04/02/2014, 02/06/2014  . Complex tear of medial meniscus of left knee as current injury 12/01/2018  . DDD (degenerative disc disease), lumbar 09/09/2018   . Depression   . Diabetes mellitus without complication ([place]) [id number]  . Dyslipidemia   . Elevated hemoglobin A1c 07/08/2016  . Estrogen deficiency 12/06/2018  . Foraminal stenosis of lumbar region 09/09/2018  . Hair loss 07/20/2017  . History of lumbar fusion 09/09/2018  . Hormone replacement therapy (HRT) 07/20/2017  . HTN (hypertension)   . Hypertension 07/08/2016  . Hypokalemia 12/06/2018  . Hypothyroidism   . Left medial tibial plateau fracture 11/10/2018  . Lumbar radiculopathy 09/09/2018   Referred to [facility] 09/09/2018  . Migraine   . Mild memory disturbance 11/13/2013  . Morbid obesity ([place]) 07/08/2016  . Multiple allergies   . Obesity   . Restless legs syndrome (RLS) 01/12/2013   Follows with Dr. Jannifer Franklin who prescribes Requip and gabapentin  . RLS (restless legs syndrome)   . Vitamin D deficiency 12/06/2018  . Wears glasses 07/08/2016    Past Surgical History:  Procedure Laterality Date  . BREAST BIOPSY  [id number]   x 2 benign lesion  . CERVICAL DISCECTOMY  2003  . CERVICAL FUSION  2010   x2  . Hand fracture surgery    . LUMBAR FUSION  2016  . VAGINAL HYSTERECTOMY  2009    Current Medications: Current Meds  Medication Sig  . apixaban (ELIQUIS)  5 MG TABS tablet Take 1 tablet (5 mg total) by mouth 2 (two) times daily.  Marland Kitchen buPROPion (WELLBUTRIN XL) 300 MG 24 hr tablet Take 1 tablet (300 mg total) by mouth daily.  . cholecalciferol (VITAMIN D) 1000 units tablet Take 5,000 Units by mouth daily.   . DULoxetine (CYMBALTA) 60 MG capsule Take 1 capsule (60 mg total) by mouth daily. Needs office visit prior to anymore refills.  Marland Kitchen estradiol (ESTRACE) 2 MG tablet Take 1 tablet (2 mg total) by mouth daily.  Marland Kitchen HORIZANT 300 MG TBCR TAKE 2 TABLETS BY MOUTH EVERY DAY AT 5PM WITH FOOD  . levothyroxine (SYNTHROID) 150 MCG tablet TAKE 1 TABLET BY MOUTH EVERY DAY  . magnesium oxide (MAG-OX) 400 (241.3 Mg) MG tablet   . magnesium oxide (MAG-OX) 400 MG tablet Take 1 tablet by mouth daily.  .  meloxicam (MOBIC) 15 MG tablet Take 1 tablet (15 mg total) by mouth daily.  . metFORMIN (GLUCOPHAGE) 1000 MG tablet Take 1 tablet (1,000 mg total) by mouth 2 (two) times daily with a meal.  . metoprolol succinate (TOPROL-XL) 25 MG 24 hr tablet Take 0.5 tablets (12.5 mg total) by mouth daily.  . Omega-3 Fatty Acids (FISH OIL PO) Take 1 capsule by mouth daily.  . potassium chloride SA (KLOR-CON) 20 MEQ tablet Take 1 tablet (20 mEq total) by mouth daily.  . progesterone (PROMETRIUM) 100 MG capsule Take 1 capsule (100 mg total) by mouth every other day.  Marland Kitchen rOPINIRole (REQUIP XL) 4 MG 24 hr tablet Take 1 tablet (4 mg total) by mouth daily.  Marland Kitchen rOPINIRole (REQUIP) 2 MG tablet Take 1/2 tablet at lunch and 1/2 tablet at bed time.  Marland Kitchen telmisartan-hydrochlorothiazide (MICARDIS HCT) 80-25 MG tablet Take 1 tablet by mouth daily.     Allergies:   Bee venom, Biaxin [clarithromycin], Gabapentin, Abilify [aripiprazole], Buspar [buspirone], Lisinopril, Maxzide [triamterene-hctz], Phentermine, and Seroquel [quetiapine fumarate]   Social History   Socioeconomic History  . Marital status: Married    Spouse name: [name]  . Number of children: 2  . Years of education: COLLEGE  . Highest education level: Not on file  Occupational History  . Occupation: OWNER    Employer: [employer]  Tobacco Use  . Smoking status: Never Smoker  . Smokeless tobacco: Never Used  Substance and Sexual Activity  . Alcohol use: No    Alcohol/week: 0.0 standard drinks  . Drug use: No  . Sexual activity: Yes    Partners: Male    Birth control/protection: None  Other Topics Concern  . Not on file  Social History Narrative   Patient is married [name])  2 children [name] and [place])   Patient is right handed.   Patient has a college education. 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    Patient drinks 1 cups daily. Uses herbal remedies, takes a daily vitamin.   Wears her seatbelt, smoke detector in the home.   Social Determinants of  Health   Financial Resource Strain:   . Difficulty of Paying Living Expenses: Not on file  Food Insecurity:   . Worried About Charity fundraiser in the Last Year: Not on file  . Ran Out of Food in the Last Year: Not on file  Transportation Needs:   . Lack of Transportation (Medical): Not on file  . Lack of Transportation (Non-Medical): Not on file  Physical Activity:   . Days of Exercise per Week: Not on file  . Minutes of Exercise per Session: Not  on file  Stress:   . Feeling of Stress : Not on file  Social Connections:   . Frequency of Communication with Friends and Family: Not on file  . Frequency of Social Gatherings with Friends and Family: Not on file  . Attends Religious Services: Not on file  . Active Member of Clubs or Organizations: Not on file  . Attends Archivist Meetings: Not on file  . Marital Status: Not on file     Family History: The patient's family history includes Arthritis in her mother; COPD in her mother; Diabetes in her brother, father, and mother; Hearing loss in her father and mother; Heart disease in her father and mother; Hypertension in her father; Liver cancer in her mother; Mental illness in her mother.  ROS:   Please see the history of present illness.     All other systems reviewed and are negative.  EKGs/Labs/Other Studies Reviewed:    The following studies were reviewed today:   EKG: December 15, 2018: Normal sinus rhythm at 74.  Poor R wave progression.  Recent Labs: 12/06/2018: ALT 21 01/03/2019: TSH 3.03 01/17/2019: BUN 17; Creatinine, Ser 0.61; Potassium 4.0; Sodium 139 01/18/2019: Hemoglobin 13.9; Platelets 334  Recent Lipid Panel    Component Value Date/Time   CHOL 150 08/02/2018 0902   TRIG 183.0 (H) 08/02/2018 0902   HDL 48.60 08/02/2018 0902   CHOLHDL 3 08/02/2018 0902   VLDL 36.6 08/02/2018 0902   LDLCALC 65 08/02/2018 0902    Physical Exam:    Physical Exam: Blood pressure 126/72, pulse 82, height 5\' 4"   (1.626 m), weight 254 lb 6.4 oz (115.4 kg), SpO2 97 %.  GEN:  Young, morbidly obese female, pleasant, NAD  HEENT: Normal NECK: No JVD; No carotid bruits LYMPHATICS: No lymphadenopathy CARDIAC: RRR  RESPIRATORY:  Clear to auscultation without rales, wheezing or rhonchi  ABDOMEN: Soft, non-tender, non-distended MUSCULOSKELETAL:  No edema; No deformity  SKIN: Warm and dry NEUROLOGIC:  Alert and oriented x 3    ASSESSMENT:    1. Essential hypertension   2. Paroxysmal atrial fibrillation (HCC)   3. Morbid obesity ([place])    PLAN:    In order of problems listed above:  1. PAF :   CHADS2VASC is  3 ( female, HTN, DM, )    She is been compliant with her Eliquis.  She is had a few episodes of palpitations but it is unclear if these were episodes of atrial fibrillation. She was having slow heart rate and low blood pressure.  She decreased her Toprol-XL from 25 mg a day to 12.5 mg a day.  She feels better on the lower dose.  2.  Episode of slurred speech.  She had a transient episode of slurred speech.  She has been completely compliant with her Eliquis.  She did not have any neuro deficits.  This may have been a TIA.  I will defer to her primary medical doctor as to further work-up.  If the conclusion is that she had a TIA then we will need to do a TEE and get carotid duplex scan.  2.  Obesity: She is joined the gym.  I encouraged her to start working out on a regular basis.  3.  Hypokalemia: Labs are better after supplementation.    Medication Adjustments/Labs and Tests Ordered: Current medicines are reviewed at length with the patient today.  Concerns regarding medicines are outlined above.  No orders of the defined types were placed in this encounter.  No orders of the defined types were placed in this encounter.   Patient Instructions  Medication Instructions:  Your physician recommends that you continue on your current medications as directed. Please refer to the Current  Medication list given to you today.  *If you need a refill on your cardiac medications before your next appointment, please call your pharmacy*  Lab Work: None Ordered  If you have labs (blood work) drawn today and your tests are completely normal, you will receive your results only by: Marland Kitchen MyChart Message (if you have MyChart) OR . A paper copy in the mail If you have any lab test that is abnormal or we need to change your treatment, we will call you to review the results.   Testing/Procedures: None Ordered   Follow-Up: At [facility], you and your health needs are our priority.  As part of our continuing mission to provide you with exceptional heart care, we have created designated Provider Care Teams.  These Care Teams include your primary Cardiologist (physician) and Advanced Practice Providers (APPs -  Physician Assistants and Nurse Practitioners) who all work together to provide you with the care you need, when you need it.  Your next appointment:   6 month(s)  The format for your next appointment:   Either In Person or Virtual  Provider:   You may see Mertie Moores, MD or one of the following Advanced Practice Providers on your designated Care Team:    Richardson Dopp, PA-C  [place], [place]  Daune Perch, [place]       Signed, Mertie Moores, MD  03/17/2019 9:37 AM    [place]

## 2019-03-17 NOTE — Patient Instructions (Signed)
Medication Instructions:  Your physician recommends that you continue on your current medications as directed. Please refer to the Current Medication list given to you today.  *If you need a refill on your cardiac medications before your next appointment, please call your pharmacy*  Lab Work: None Ordered  If you have labs (blood work) drawn today and your tests are completely normal, you will receive your results only by: Marland Kitchen MyChart Message (if you have MyChart) OR . A paper copy in the mail If you have any lab test that is abnormal or we need to change your treatment, we will call you to review the results.   Testing/Procedures: None Ordered   Follow-Up: At [facility], you and your health needs are our priority.  As part of our continuing mission to provide you with exceptional heart care, we have created designated Provider Care Teams.  These Care Teams include your primary Cardiologist (physician) and Advanced Practice Providers (APPs -  Physician Assistants and Nurse Practitioners) who all work together to provide you with the care you need, when you need it.  Your next appointment:   6 month(s)  The format for your next appointment:   Either In Person or Virtual  Provider:   You may see Mertie Moores, MD or one of the following Advanced Practice Providers on your designated Care Team:    Richardson Dopp, PA-C  [place], [place]  Daune Perch, [place]

## 2019-03-28 ENCOUNTER — Telehealth: Payer: Self-pay | Admitting: Neurology

## 2019-03-28 NOTE — Telephone Encounter (Signed)
Completed the PA for [facility] through cover my meds/ [employer]. [id number] Will wait for response

## 2019-04-03 NOTE — Telephone Encounter (Signed)
PA was denied by the [employer]. I will complete a appeal letter to send to the insurance company on behalf of the patient to attempt to get it approved.

## 2019-04-06 NOTE — Telephone Encounter (Signed)
PA appeal was available to complete on cover my meds. Completed and faxed to cover my meds.

## 2019-04-07 NOTE — Telephone Encounter (Signed)
Pt called to report that the [place] 300 MG TBCR is not covered by her new insurance.  Pt was told of Myriam Jacobson, RN's last entry.  Pt wanted to provide her new insurance if [place], [place] didn't have it [place] [id number] Plan Type:[id number] [employer] # [facility] Effective date 03-03-19  RX Group:[id number]

## 2019-04-10 ENCOUNTER — Other Ambulatory Visit: Payer: Self-pay

## 2019-04-10 ENCOUNTER — Ambulatory Visit: Payer: BC Managed Care – PPO | Admitting: Internal Medicine

## 2019-04-10 ENCOUNTER — Ambulatory Visit (INDEPENDENT_AMBULATORY_CARE_PROVIDER_SITE_OTHER): Payer: 59 | Admitting: Family Medicine

## 2019-04-10 ENCOUNTER — Encounter: Payer: Self-pay | Admitting: Family Medicine

## 2019-04-10 VITALS — BP 130/66 | HR 84 | Temp 97.7°F | Ht 64.0 in

## 2019-04-10 DIAGNOSIS — R300 Dysuria: Secondary | ICD-10-CM

## 2019-04-10 MED ORDER — CEPHALEXIN 500 MG PO CAPS: 500.0000 mg | ORAL_CAPSULE | Freq: Three times a day (TID) | ORAL | 0 refills | Status: DC

## 2019-04-10 NOTE — Patient Instructions (Signed)
COVID-19 Vaccine Information can be found at: [email or web address] For questions related to vaccine distribution or appointments, please email [email or web address] or call [phone]  Covid Vaccine appointment go to [email or web address].  

## 2019-04-10 NOTE — Progress Notes (Signed)
VIRTUAL VISIT VIA VIDEO  I connected with [name] [name] on 04/10/19 at  4:00 PM EST by a video enabled telemedicine application and verified that I am speaking with the correct person using two identifiers. Location patient: Home Location provider: [facility], Office Persons participating in the virtual visit: Patient, Dr. Raoul Pitch and R.Baker, LPN  I discussed the limitations of evaluation and management by telemedicine and the availability of in person appointments. The patient expressed understanding and agreed to proceed.   [name] [name] , 11/13/1965, 54 y.o., female MRN: [id number] Patient Care Team    Relationship Specialty Notifications Start End  [name], DO PCP - General Family Medicine  07/07/16   Nahser, Wonda Cheng, MD PCP - Cardiology Cardiology Admissions 12/15/18   Ob/Gyn, [name]    07/08/16   Wilford Corner, MD Consulting Physician Gastroenterology  07/08/16   [name], MD Consulting Physician Neurology  07/08/16   [name], MD Attending Physician Endocrinology  07/08/16    Comment: Pt established with Si Raider, NP at this location. - Integrative med- thyroid  [name], MD Consulting Physician Rehabilitation  03/06/19    Comment: Neurosurgeon-spine and scoliosis specialist    Chief Complaint  Patient presents with   Urinary Tract Infection    Pt has frequnecy, burning, and lower left back pain x1 day. No blood in urine. No fever.      Subjective: [name] [name] is a 54 y.o. female present for recurrent UTI symptoms.  Patient reports yesterday afternoon she endorses the start of urinary frequency, dysuria and incomplete emptying of her bladder.  She states she is using the bathroom frequently, but only a little coming out each time.  She feels her daily urine output total is her normal.  She is hydrating.  She endorses mild nausea that also started yesterday and mild left lower back discomfort.  She denies any fevers, chills  or vomiting.  She denies any vaginal discharge.  He does not have a history of kidney stones.  She had a similar episode of urinary frequency approximately 1 month ago with a negative urine culture.  At that time she was having low back pain and it was felt to be possible referred pain.  She has had urinary tract infections in the past, last noted in 2016 was pansensitive E. coli.  She reports her current symptoms feel more like her prior UTIs more so than her symptoms last month.   MRI lumbar spine 09/02/2018: IMPRESSION: 1. Chronic interbody and interspinous implants at L2-L3. 2. 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 3. Interval posterior decompression at L4-L5 with regressed but not resolved spinal, lateral recess and foraminal stenosis at that level. 4. Increased mild to moderate neural foraminal stenosis at L5-S1. Lumbar xray 11/2017: IMPRESSION: 1. When compared to prior examination, there has been mild progression of disc space narrowing at the L1-2, L3-4 and L4-5 level. 2. Prior fusion L2-3 disc space. Prior L2-3 inter spinous fusion. 3. Moderate to marked L3-4 disc space narrowing. 4. Moderate L1-2 and L4-5 disc space narrowing. 5. Mild L5-S1 disc space narrowing.  Depression screen [facility] 2/9 07/27/2018 10/08/2017 05/03/2017 01/06/2017 07/07/2016  Decreased Interest 0 0 0 0 0  Down, Depressed, Hopeless 0 0 0 0 0  PHQ - 2 Score 0 0 0 0 0  Altered sleeping 3  3 - 3 -  Tired, decreased energy 3 3 - 0 -  Change in appetite 3 1 - 0 -  Feeling bad or failure about yourself  0 0 - 0 -  Trouble concentrating 0 - - 0 -  Moving slowly or fidgety/restless 0 0 - 0 -  Suicidal thoughts 0 0 - 0 -  PHQ-9 Score 9 7 - 3 -  Difficult doing work/chores Not difficult at all Somewhat difficult - Not difficult at all -    Allergies    Allergen Reactions   Bee Venom Swelling and Anaphylaxis   Biaxin [Clarithromycin] Diarrhea and Rash   Gabapentin Nausea And Vomiting    No reaction to Horizant   Abilify [Aripiprazole] Other (See Comments)    MOOD SWING   Buspar [Buspirone] Diarrhea   Lisinopril Cough   Maxzide [Triamterene-Hctz] Rash   Phentermine Other (See Comments)    MOOD CHANGE    Seroquel [Quetiapine Fumarate] Other (See Comments)    MOOD SWINGS   Social History   Social History Narrative   Patient is married [name])  2 children [name] and [place])   Patient is right handed.   Patient has a college education. 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    Patient drinks 1 cups daily. Uses herbal remedies, takes a daily vitamin.   Wears her seatbelt, smoke detector in the home.   Past Medical History:  Diagnosis Date   Anxiety 01/12/2013   Arthralgia 07/08/2016   Asthma    Atrial fibrillation ([place]) 12/06/2018   B12 deficiency 01/31/2018   Chicken pox    Chronic insomnia 11/24/2016   Clostridium difficile infection 04/02/2014, 02/06/2014   Complex tear of medial meniscus of left knee as current injury 12/01/2018   DDD (degenerative disc disease), lumbar 09/09/2018   Depression    Diabetes mellitus without complication ([place]) [id number]   Dyslipidemia    Elevated hemoglobin A1c 07/08/2016   Estrogen deficiency 12/06/2018   Foraminal stenosis of lumbar region 09/09/2018   Hair loss 07/20/2017   History of lumbar fusion 09/09/2018   Hormone replacement therapy (HRT) 07/20/2017   HTN (hypertension)    Hypertension 07/08/2016   Hypokalemia 12/06/2018   Hypothyroidism    Left medial tibial plateau fracture 11/10/2018   Lumbar radiculopathy 09/09/2018   Referred to NS 09/09/2018   Migraine    Mild memory disturbance 11/13/2013   Morbid obesity ([place]) 07/08/2016   Multiple allergies    Obesity    Restless legs syndrome (RLS) 01/12/2013   Follows with Dr. Jannifer Franklin who prescribes Requip and gabapentin    RLS (restless legs syndrome)    Vitamin D deficiency 12/06/2018   Wears glasses 07/08/2016   Past Surgical History:  Procedure Laterality Date   BREAST BIOPSY  [id number]   x 2 benign lesion   CERVICAL DISCECTOMY  2003   CERVICAL FUSION  2010   x2   Hand fracture surgery     LUMBAR FUSION  2016   VAGINAL HYSTERECTOMY  2009   Family History  Problem Relation Age of Onset   Hypertension Father    Diabetes Father    Heart disease Father    Hearing loss Father    Diabetes Brother    Arthritis Mother    Liver cancer Mother    COPD Mother    Mental illness Mother    Diabetes Mother    Hearing loss Mother    Heart disease Mother    Allergies as of 04/10/2019  Reactions   Bee Venom Swelling, Anaphylaxis   Biaxin [clarithromycin] Diarrhea, Rash   Gabapentin Nausea And Vomiting   No reaction to Horizant   Abilify [aripiprazole] Other (See Comments)   MOOD SWING   Buspar [buspirone] Diarrhea   Lisinopril Cough   Maxzide [triamterene-hctz] Rash   Phentermine Other (See Comments)   MOOD CHANGE   Seroquel [quetiapine Fumarate] Other (See Comments)   MOOD SWINGS      Medication List       Accurate as of April 10, 2019  5:29 PM. If you have any questions, ask your nurse or doctor.        STOP taking these medications   magnesium oxide 400 (241.3 Mg) MG tablet Commonly known as: MAG-OX Stopped by: Howard Pouch, DO     TAKE these medications   apixaban 5 MG Tabs tablet Commonly known as: ELIQUIS Take 1 tablet (5 mg total) by mouth 2 (two) times daily.   buPROPion 300 MG 24 hr tablet Commonly known as: WELLBUTRIN XL Take 1 tablet (300 mg total) by mouth daily.   cephALEXin 500 MG capsule Commonly known as: KEFLEX Take 1 capsule (500 mg total) by mouth 3 (three) times daily. Started by: Howard Pouch, DO   cholecalciferol 1000 units tablet Commonly known as: VITAMIN D Take 5,000 Units by mouth daily.   DULoxetine 60 MG capsule Commonly  known as: CYMBALTA Take 1 capsule (60 mg total) by mouth daily. Needs office visit prior to anymore refills.   estradiol 2 MG tablet Commonly known as: ESTRACE Take 1 tablet (2 mg total) by mouth daily.   FISH OIL PO Take 1 capsule by mouth daily.   Horizant 300 MG Tbcr Generic drug: Gabapentin Enacarbil ER TAKE 2 TABLETS BY MOUTH EVERY DAY AT 5PM WITH FOOD   levothyroxine 150 MCG tablet Commonly known as: SYNTHROID TAKE 1 TABLET BY MOUTH EVERY DAY   magnesium oxide 400 MG tablet Commonly known as: MAG-OX Take 1 tablet by mouth daily.   meloxicam 15 MG tablet Commonly known as: MOBIC Take 1 tablet (15 mg total) by mouth daily.   metFORMIN 1000 MG tablet Commonly known as: GLUCOPHAGE Take 1 tablet (1,000 mg total) by mouth 2 (two) times daily with a meal.   metoprolol succinate 25 MG 24 hr tablet Commonly known as: TOPROL-XL Take 0.5 tablets (12.5 mg total) by mouth daily.   potassium chloride SA 20 MEQ tablet Commonly known as: KLOR-CON Take 1 tablet (20 mEq total) by mouth daily.   progesterone 100 MG capsule Commonly known as: PROMETRIUM Take 1 capsule (100 mg total) by mouth every other day.   rOPINIRole 2 MG tablet Commonly known as: REQUIP Take 1/2 tablet at lunch and 1/2 tablet at bed time.   rOPINIRole 4 MG 24 hr tablet Commonly known as: REQUIP XL Take 1 tablet (4 mg total) by mouth daily.   telmisartan-hydrochlorothiazide 80-25 MG tablet Commonly known as: MICARDIS HCT Take 1 tablet by mouth daily.       All past medical history, surgical history, allergies, family history, immunizations andmedications were updated in the EMR today and reviewed under the history and medication portions of their EMR.     ROS: Negative, with the exception of above mentioned in HPI   Objective:  BP 130/66    Pulse 84    Temp 97.7 F (36.5 C) (Temporal)    Ht 5\' 4"  (1.626 m)    BMI 43.67 kg/m  Body mass index is 43.67 kg/m. Gen:  Afebrile. No acute distress.    HENT: AT. [place].  Eyes:Pupils Equal Round Reactive to light, Extraocular movements intact,  Conjunctiva without redness, discharge or icterus..  Neuro: . Alert. Oriented x3    No exam data present No results found. No results found for this or any previous visit (from the past 24 hour(s)).  Assessment/Plan: [name] [name] is a 54 y.o. female present for OV for  Urinary frequency/dysuria Discussed options with patient today.  Since she was having other upper respiratory symptoms was unable to bring her in for urinary collection.  Therefore we will need to treat prophylactically and have a close follow-up if any continued urinary symptoms remain.  Patient was encouraged to report to an urgent care or emergency room if she develops increased pain, fever or decreased urinary output.  She was educated on signs and symptoms of pyelonephritis and nephrolithiasis. -Prophylactic treatment with Keflex 3 times daily x7 days.  Hydrate.  NSAIDs for discomfort. Follow-up as needed    Reviewed expectations re: course of current medical issues.  Discussed self-management of symptoms.  Outlined signs and symptoms indicating need for more acute intervention.  Patient verbalized understanding and all questions were answered.  Patient received an After-Visit Summary.    No orders of the defined types were placed in this encounter.  Meds ordered this encounter  Medications   cephALEXin (KEFLEX) 500 MG capsule    Sig: Take 1 capsule (500 mg total) by mouth 3 (three) times daily.    Dispense:  21 capsule    Refill:  0     Note is dictated utilizing voice recognition software. Although note has been proof read prior to signing, occasional typographical errors still can be missed. If any questions arise, please do not hesitate to call for verification.   electronically signed by:  Howard Pouch, DO  [facility]

## 2019-04-10 NOTE — Telephone Encounter (Signed)
PA appeal still pending. The insurance and PA was completed through [facility] health. Will contact patient as soon as we hear.

## 2019-06-12 ENCOUNTER — Other Ambulatory Visit: Payer: Self-pay | Admitting: Neurology

## 2019-06-13 ENCOUNTER — Other Ambulatory Visit: Payer: Self-pay

## 2019-06-13 ENCOUNTER — Telehealth (INDEPENDENT_AMBULATORY_CARE_PROVIDER_SITE_OTHER): Payer: 59 | Admitting: Family Medicine

## 2019-06-13 ENCOUNTER — Encounter: Payer: Self-pay | Admitting: Family Medicine

## 2019-06-13 VITALS — BP 125/74 | HR 81 | Ht 64.0 in

## 2019-06-13 DIAGNOSIS — I10 Essential (primary) hypertension: Secondary | ICD-10-CM | POA: Diagnosis not present

## 2019-06-13 DIAGNOSIS — E039 Hypothyroidism, unspecified: Secondary | ICD-10-CM | POA: Diagnosis not present

## 2019-06-13 DIAGNOSIS — F339 Major depressive disorder, recurrent, unspecified: Secondary | ICD-10-CM

## 2019-06-13 DIAGNOSIS — E119 Type 2 diabetes mellitus without complications: Secondary | ICD-10-CM

## 2019-06-13 MED ORDER — METOPROLOL SUCCINATE ER 25 MG PO TB24: 12.5000 mg | ORAL_TABLET | Freq: Every day | ORAL | 1 refills | Status: DC

## 2019-06-13 MED ORDER — BUPROPION HCL ER (XL) 300 MG PO TB24: 300.0000 mg | ORAL_TABLET | Freq: Every day | ORAL | 1 refills | Status: DC

## 2019-06-13 MED ORDER — ESTRADIOL 2 MG PO TABS: 2.0000 mg | ORAL_TABLET | Freq: Every day | ORAL | 3 refills | Status: DC

## 2019-06-13 MED ORDER — LIOTHYRONINE SODIUM 5 MCG PO TABS: 5.0000 ug | ORAL_TABLET | Freq: Every day | ORAL | 0 refills | Status: DC

## 2019-06-13 MED ORDER — LEVOTHYROXINE SODIUM 150 MCG PO TABS: 150.0000 ug | ORAL_TABLET | Freq: Every day | ORAL | 3 refills | Status: DC

## 2019-06-13 MED ORDER — TELMISARTAN-HCTZ 80-25 MG PO TABS: 1.0000 | ORAL_TABLET | Freq: Every day | ORAL | 1 refills | Status: DC

## 2019-06-13 MED ORDER — DULOXETINE HCL 60 MG PO CPEP: 60.0000 mg | ORAL_CAPSULE | Freq: Every day | ORAL | 1 refills | Status: DC

## 2019-06-13 NOTE — Progress Notes (Signed)
VIRTUAL VISIT VIA VIDEO  I connected with [name] [name] on 06/19/19 at  1:00 PM EDT by a video enabled telemedicine application and verified that I am speaking with the correct person using two identifiers. Location patient: Home Location provider: [facility], Office Persons participating in the virtual visit: Patient, Dr. Raoul Pitch and R.Baker, LPN  I discussed the limitations of evaluation and management by telemedicine and the availability of in person appointments. The patient expressed understanding and agreed to proceed.   Patient Care Team    Relationship Specialty Notifications Start End  [name], DO PCP - General Family Medicine  07/07/16   Nahser, Wonda Cheng, MD PCP - Cardiology Cardiology Admissions 12/15/18   Ob/Gyn, [name]    07/08/16   Wilford Corner, MD Consulting Physician Gastroenterology  07/08/16   [name], MD Consulting Physician Neurology  07/08/16   [name], MD Attending Physician Endocrinology  07/08/16    Comment: Pt established with Si Raider, NP at this location. - Integrative med- thyroid  [name], MD Consulting Physician Rehabilitation  03/06/19    Comment: Neurosurgeon-spine and scoliosis specialist    SUBJECTIVE Chief Complaint  Patient presents with  . Hypertension    Pt needs refills on all medications. Pt no longer wants to see Endocrinologist.   . Depression  . Hypothyroidism    HPI: [name] [name] is a 54 y.o. female present for [facility] follow up.  Recurrent major depressive disorder, remission status unspecified (HCC) Reports  compliance with Wellbutrin 300 mg daily and Cymbalta 60 mg daily. She is tolerating these medications well without any side effects.    She reports she is doing well on these medications.  Arthralgia, unspecified joint Patient reports she is doing well with her Mobic.  She does need refills on this today.  She reports increase in her restless leg symptoms.  She is prescribed  Requip for this condition and is under the care of neurology.  Hypertension/Morbid obesity/elevated a1c:Pt reports  compliance withmicardis80-25 milligrams. Patient denies chest pain, shortness of breath, dizziness or lower extremity edema.  Noted in cardiology's chart, not mentioned by patient, that she had an episode of slurred speech that was transient, and was associated with feeling hot and flushed.  Symptoms resolved after 10 minutes without any neurological deficits.  Patient reports she felt like she had a rush of adrenaline.  She has not had repeat symptoms.  She did not have work/imaging for the symptoms.   ASA recommended.  Pt is notprescribed statin. Labs up-to-date 01/2019 Diet/exercise does not routinely watch diet and exercise [id number], obesity,fhxheart disease  Diabetes: Pt reports she was unable to tolerate the Metformin and has not been taking it for a little over 2 months.  Her fasting blood glucoses are reported to be around 200. PNA series: Will offer next diabetic appointment. Flu shot: Declined today (recommneded yearly) Foot exam: We will start foot exams at her next appointment. Eye exam: Encouraged yearly eye exams. A1c: 6.5 > 6.3 > ordered today  Hypothyroidism/Vit D def/HRT:  Patient returns and would like thyroid now managed here.  She reports compliance with levothyroxine 150 mcg daily on empty stomach.  She reports compliance with her estrogen and progesterone supplementations.  Her mammogram is up-to-date.  She is still having concerns about her hair loss and does not tell us improved.  Her iron levels have been normal, vitamin D in the low normal.  TSH has been normal from August through  November.  She reports her endocrinologist had her taking 150 mcg daily x6 days a week and 300 mcg daily x1 day week.  She did not like the way this made her feel and she has been taking 150 mcg daily.  Patient reports she feels better when her TSH is around 4.  She is  wondering if adding back the Cytomel would be helpful with her hair loss. Prior note. Thyroid is now managed by endocrine and she is taking levothyroxine 150 mcg QD-her levels were in normal range when last checked.  She reports she is still having hair loss issues.  She reports compliance with Estrace and progesterone and her mammogram is up-to-date 09/14/2018.  ROS: See pertinent positives and negatives per HPI.  Patient Active Problem List   Diagnosis Date Noted  . Diabetes mellitus without complication ([place]) [id number]  . Hypokalemia 12/06/2018  . Atrial fibrillation ([place]) 12/06/2018  . Vitamin D deficiency 12/06/2018  . Estrogen deficiency 12/06/2018  . Complex tear of medial meniscus of left knee as current injury 12/01/2018  . Left medial tibial plateau fracture 11/10/2018  . DDD (degenerative disc disease), lumbar 09/09/2018  . Foraminal stenosis of lumbar region 09/09/2018  . History of lumbar fusion 09/09/2018  . Lumbar radiculopathy 09/09/2018  . B12 deficiency 01/31/2018  . Hormone replacement therapy (HRT) 07/20/2017  . Hair loss 07/20/2017  . Chronic insomnia 11/24/2016  . Elevated hemoglobin A1c 07/08/2016  . Hypertension 07/08/2016  . Arthralgia 07/08/2016  . Morbid obesity ([place]) 07/08/2016  . Wears glasses 07/08/2016  . Hypothyroidism   . Dyslipidemia   . Depression   . Mild memory disturbance 11/13/2013  . Restless legs syndrome (RLS) 01/12/2013    Social History   Tobacco Use  . Smoking status: Never Smoker  . Smokeless tobacco: Never Used  Substance Use Topics  . Alcohol use: No    Alcohol/week: 0.0 standard drinks    Current Outpatient Medications:  .  apixaban (ELIQUIS) 5 MG TABS tablet, Take 1 tablet (5 mg total) by mouth 2 (two) times daily., Disp: 60 tablet, Rfl: 11 .  buPROPion (WELLBUTRIN XL) 300 MG 24 hr tablet, Take 1 tablet (300 mg total) by mouth daily., Disp: 90 tablet, Rfl: 1 .  cholecalciferol (VITAMIN D) 1000 units tablet, Take 5,000  Units by mouth daily. , Disp: , Rfl:  .  DULoxetine (CYMBALTA) 60 MG capsule, Take 1 capsule (60 mg total) by mouth daily. Needs office visit prior to anymore refills., Disp: 90 capsule, Rfl: 1 .  estradiol (ESTRACE) 2 MG tablet, Take 1 tablet (2 mg total) by mouth daily., Disp: 90 tablet, Rfl: 3 .  magnesium oxide (MAG-OX) 400 MG tablet, Take 1 tablet by mouth daily., Disp: , Rfl:  .  meloxicam (MOBIC) 15 MG tablet, Take 1 tablet (15 mg total) by mouth daily., Disp: 90 tablet, Rfl: 3 .  metoprolol succinate (TOPROL-XL) 25 MG 24 hr tablet, Take 0.5 tablets (12.5 mg total) by mouth daily., Disp: 45 tablet, Rfl: 1 .  Omega-3 Fatty Acids (FISH OIL PO), Take 1 capsule by mouth daily., Disp: , Rfl:  .  potassium chloride SA (KLOR-CON) 20 MEQ tablet, Take 1 tablet (20 mEq total) by mouth daily., Disp: 90 tablet, Rfl: 3 .  progesterone (PROMETRIUM) 100 MG capsule, Take 1 capsule (100 mg total) by mouth every other day., Disp: 45 capsule, Rfl: 3 .  rOPINIRole (REQUIP XL) 4 MG 24 hr tablet, Take 1 tablet (4 mg total) by mouth daily., Disp: 90 tablet,  Rfl: 3 .  rOPINIRole (REQUIP) 2 MG tablet, Take 1/2 tablet at lunch and 1/2 tablet at bed time., Disp: 90 tablet, Rfl: 3 .  telmisartan-hydrochlorothiazide (MICARDIS HCT) 80-25 MG tablet, Take 1 tablet by mouth daily., Disp: 90 tablet, Rfl: 1 .  Gabapentin Enacarbil ER (HORIZANT) 300 MG TBCR, TAKE 2 TABLETS BY MOUTH EVERY DAY AT 5PM WITH FOOD, Disp: 60 tablet, Rfl: 6 .  levothyroxine (SYNTHROID) 150 MCG tablet, Take 1 tablet (150 mcg total) by mouth daily., Disp: 90 tablet, Rfl: 3 .  liothyronine (CYTOMEL) 5 MCG tablet, Take 1 tablet (5 mcg total) by mouth daily., Disp: 90 tablet, Rfl: 0  Allergies  Allergen Reactions  . Bee Venom Swelling and Anaphylaxis  . Biaxin [Clarithromycin] Diarrhea and Rash  . Gabapentin Nausea And Vomiting    No reaction to Horizant  . Abilify [Aripiprazole] Other (See Comments)    MOOD SWING  . Buspar [Buspirone] Diarrhea  .  Lisinopril Cough  . Maxzide [Triamterene-Hctz] Rash  . Phentermine Other (See Comments)    MOOD CHANGE   . Seroquel [Quetiapine Fumarate] Other (See Comments)    MOOD SWINGS    OBJECTIVE: BP 125/74   Pulse 81   Ht 5\' 4"  (1.626 m)   BMI 43.67 kg/m  Gen: Afebrile. No acute distress.  HENT: AT. .  MMM.  Eyes:Pupils Equal Round Reactive to light, Extraocular movements intact,  Conjunctiva without redness, discharge or icterus. CV: No edema Chest: No cough or shortness of breath Neuro:  Normal gait. PERLA. EOMi. Alert. Oriented x3 Psych: Normal affect, dress and demeanor. Normal speech. Normal thought content and judgment.  Depression screen [facility] 2/9 06/13/2019 07/27/2018 10/08/2017 05/03/2017 01/06/2017  Decreased Interest 0 0 0 0 0  Down, Depressed, Hopeless 0 0 0 0 0  PHQ - 2 Score 0 0 0 0 0  Altered sleeping 3 3 3  - 3  Tired, decreased energy 3 3 3  - 0  Change in appetite 3 3 1  - 0  Feeling bad or failure about yourself  0 0 0 - 0  Trouble concentrating 1 0 - - 0  Moving slowly or fidgety/restless 0 0 0 - 0  Suicidal thoughts 0 0 0 - 0  PHQ-9 Score 10 9 7  - 3  Difficult doing work/chores Not difficult at all Not difficult at all Somewhat difficult - Not difficult at all    ASSESSMENT AND PLAN: [name] [name] is a 54 y.o. female present for  Hypothyroidism/hair loss/HRT -Continue levothyroxine 150 mcg QD.  -Lab appointment for TSH, T4 and T3.   - If able will add back low-dose Cytomel to see if she sees any added benefit. -Continue estrogen and progesterone -Mammogram up-to-date 08/2018 -Follow-up 5.5 months  Recurrent major depressive disorder, remission status unspecified (HCC) Stable.   Continue Cymbalta 60 mg daily  Continue Wellbutrin 300 mg daily  Follow-up 5.5 months, sooner if needed   Arthralgia, unspecified joint Stable. -Continue Mobic 15 mg daily. -Iron panel has been stable 01/2019.  -Requip and horizontal are prescribed by her neurology team. -Follow-up  5.5 months  Vitamin D deficiency: -Patient is taking 5000 units daily.   -Continue current supplementation  Essential hypertension/morbid obesity/PAF Stable. Discussed her event reported to her cardiologist the beginning of January.  In for patient this could have been a TIA.  TIAs can be warnings of future strokes.  Only way to know for certain if he had a stroke is to do an MRI and have full evaluation.  Patient  is not interested in pursuing further evaluation at this time and believes it was an isolated event.  Patient understands if symptoms recur to present to the emergency room immediately would be in her best interest. Continue micardis Continue metoprolol 12.5 mg daily Continue Eliquis prescribed by cardiology Continue to follow with cardiology Low sodium.  Follow-up 5.5 months  Elevated hemoglobin A1c Has not been taking Metformin secondary to reported intolerance for almost 3 months.  Will recollect A1c today and guide her further once we have results. PNA series: Will offer next diabetic appointment. Flu shot: Declined today (recommneded yearly) BMP: Repeated today Foot exam: We will start foot exams at her next appointment. Eye exam: Encouraged yearly eye exams. A1c: 6.5 > 6.3> ordered today - Hemoglobin A1c -Follow-up 4 months    Orders Placed This Encounter  Procedures  . Hemoglobin A1c  . TSH  . T4, free  . T3, free   Meds ordered this encounter  Medications  . telmisartan-hydrochlorothiazide (MICARDIS HCT) 80-25 MG tablet    Sig: Take 1 tablet by mouth daily.    Dispense:  90 tablet    Refill:  1  . metoprolol succinate (TOPROL-XL) 25 MG 24 hr tablet    Sig: Take 0.5 tablets (12.5 mg total) by mouth daily.    Dispense:  45 tablet    Refill:  1  . DISCONTD: levothyroxine (SYNTHROID) 150 MCG tablet    Sig: Take 1 tablet (150 mcg total) by mouth daily.    Dispense:  90 tablet    Refill:  3  . estradiol (ESTRACE) 2 MG tablet    Sig: Take 1 tablet (2  mg total) by mouth daily.    Dispense:  90 tablet    Refill:  3  . DULoxetine (CYMBALTA) 60 MG capsule    Sig: Take 1 capsule (60 mg total) by mouth daily. Needs office visit prior to anymore refills.    Dispense:  90 capsule    Refill:  1  . buPROPion (WELLBUTRIN XL) 300 MG 24 hr tablet    Sig: Take 1 tablet (300 mg total) by mouth daily.    Dispense:  90 tablet    Refill:  1  . liothyronine (CYTOMEL) 5 MCG tablet    Sig: Take 1 tablet (5 mcg total) by mouth daily.    Dispense:  90 tablet    Refill:  0  . DISCONTD: levothyroxine (SYNTHROID) 150 MCG tablet    Sig: Take 1 tablet (150 mcg total) by mouth daily.    Dispense:  90 tablet    Refill:  3    Pt reports name brand only   Referral Orders  No referral(s) requested today    This visit occurred during the SARS-CoV-2 public health emergency.  Safety protocols were in place, including screening questions prior to the visit, additional usage of staff PPE, and extensive cleaning of exam room while observing appropriate contact time as indicated for disinfecting solutions.       Howard Pouch, DO 06/19/2019

## 2019-06-14 ENCOUNTER — Other Ambulatory Visit: Payer: Self-pay

## 2019-06-14 ENCOUNTER — Other Ambulatory Visit: Payer: Self-pay | Admitting: *Deleted

## 2019-06-14 ENCOUNTER — Ambulatory Visit (INDEPENDENT_AMBULATORY_CARE_PROVIDER_SITE_OTHER): Payer: 59 | Admitting: Family Medicine

## 2019-06-14 DIAGNOSIS — E039 Hypothyroidism, unspecified: Secondary | ICD-10-CM | POA: Diagnosis not present

## 2019-06-14 DIAGNOSIS — E119 Type 2 diabetes mellitus without complications: Secondary | ICD-10-CM

## 2019-06-14 MED ORDER — HORIZANT 300 MG PO TBCR: EXTENDED_RELEASE_TABLET | ORAL | 6 refills | Status: DC

## 2019-06-15 ENCOUNTER — Telehealth: Payer: Self-pay | Admitting: Family Medicine

## 2019-06-15 LAB — HEMOGLOBIN A1C
Hgb A1c MFr Bld: 6 % of total Hgb — ABNORMAL HIGH (ref <5.7)
Mean Plasma Glucose: 126 (calc)
eAG (mmol/L): 7 (calc)

## 2019-06-15 LAB — TSH: TSH: 4.51 mIU/L — ABNORMAL HIGH

## 2019-06-15 LAB — T4, FREE: Free T4: 1.6 ng/dL (ref 0.8–1.8)

## 2019-06-15 LAB — T3, FREE: T3, Free: 3.3 pg/mL (ref 2.3–4.2)

## 2019-06-15 NOTE — Telephone Encounter (Signed)
Pt was called and confirmed she is taking 150 mcg daily and Levothyroxine (not the brand name). She said the lady she went and saw regarding her Thyroid would not listen to her and she did not want to take the extra pill weekly, this is why she is not going back.   Pt given instructions/results, she verbalized understanding. F/U appt made.

## 2019-06-15 NOTE — Telephone Encounter (Signed)
Please inform patient the following information: -Please confirm her thyroid dose.  Her med list states that she is prescribed levothyroxine 150 mcg daily.  However her last endocrine appointment states she is taking the above 1 tab daily x6 days and 2 tabs x1 day. -Also please confirm that she has to have Synthroid (name brand) versus levothyroxine> prior prescription is not clear.   Her lab results indicate that we may not need to restart a diabetes medication on her.  She was unable to tolerate Metformin and reported not taking her medicine in over 2 months.  Her A1c is 6.0-and her reported glucoses are low enough I think we can wait and monitor before restarting a medication for diabetes.  Her TSH is 4.51, which is just barely above normal (normal < 4.5)  with a normal T3 and T4.  I would recommend keeping the levothyroxine/Synthroid dose the same as what she is doing now since we added a small dose of Cytomel.  However I do need clarification on her levothyroxine brand/dose/pills per day for our records.   Please schedule follow-up in 5.5 months, sooner if needed

## 2019-06-16 MED ORDER — LEVOTHYROXINE SODIUM 150 MCG PO TABS: 150.0000 ug | ORAL_TABLET | Freq: Every day | ORAL | 3 refills | Status: DC

## 2019-06-16 NOTE — Telephone Encounter (Signed)
Pharmacy was called and told they could refill as generic

## 2019-06-16 NOTE — Telephone Encounter (Signed)
Rb please let pharmacy know generic synthroid is preferred. I had written "name brand only" in the pharmacy notes when we thought she was requesting name brand only.  I did send in a new script this morning- but a call is warranted incase they already filled it. Thanks.

## 2019-06-16 NOTE — Addendum Note (Signed)
Addended by: Howard Pouch A on: 06/16/2019 07:47 AM   Modules accepted: Orders

## 2019-08-15 ENCOUNTER — Ambulatory Visit: Payer: BC Managed Care – PPO | Admitting: Neurology

## 2019-08-15 NOTE — Progress Notes (Deleted)
[name] DOB: 01/24/1966  REASON FOR VISIT: follow up HISTORY FROM: patient  HISTORY OF PRESENT ILLNESS: Today 08/15/19  Ms. Ziesmer is a 54 year old female with history of restless leg syndrome.  Her symptoms are under good control she takes her medications.  She takes medications every 4 hours.  She takes her lisinopril 100 mg at 7 AM, again at 7 PM.  Requip ER 4 mg at 7 AM, Requip IR 2 mg at 7 PM.  HISTORY 08/09/2018 Ms. Stammen is a 54 year old female with history of restless leg syndrome.  She reports her symptoms are under good control if she takes her medications.  She says she has to take medications every 4 hours, if she waits 13 hours, she will have symptoms.  She is currently taking Horizant 300 mg at 7 AM, again at 7 PM.  She is taking Requip ER.4 mg at 7 AM, Requip IR 2 mg at 7 PM.  If she takes her medications on schedule, she does not have any issues.  She indicates she needs refills of her medications.  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  Recently she has been diagnosed with diabetes, had an A1c of 6.5, was started on metformin.   REVIEW OF SYSTEMS: Out of a complete 14 system review of symptoms, the patient complains only of the following symptoms, and all other reviewed systems are negative.  ALLERGIES: Allergies  Allergen Reactions  . Bee Venom Swelling and Anaphylaxis  . Biaxin [Clarithromycin] Diarrhea and Rash  . Gabapentin Nausea And Vomiting    No reaction to Horizant  . Abilify [Aripiprazole] Other (See Comments)    MOOD SWING  . Buspar [Buspirone] Diarrhea  . Lisinopril Cough  . Maxzide [Triamterene-Hctz] Rash  . Phentermine Other (See Comments)    MOOD CHANGE   . Seroquel [Quetiapine Fumarate] Other (See Comments)    MOOD SWINGS    HOME MEDICATIONS: Outpatient Medications Prior to Visit  Medication Sig Dispense Refill  . apixaban (ELIQUIS) 5 MG TABS tablet Take 1 tablet (5 mg total) by mouth 2 (two) times daily. 60 tablet 11  .  buPROPion (WELLBUTRIN XL) 300 MG 24 hr tablet Take 1 tablet (300 mg total) by mouth daily. 90 tablet 1  . cholecalciferol (VITAMIN D) 1000 units tablet Take 5,000 Units by mouth daily.     . DULoxetine (CYMBALTA) 60 MG capsule Take 1 capsule (60 mg total) by mouth daily. Needs office visit prior to anymore refills. 90 capsule 1  . estradiol (ESTRACE) 2 MG tablet Take 1 tablet (2 mg total) by mouth daily. 90 tablet 3  . Gabapentin Enacarbil ER (HORIZANT) 300 MG TBCR TAKE 2 TABLETS BY MOUTH EVERY DAY AT 5PM WITH FOOD 60 tablet 6  . levothyroxine (SYNTHROID) 150 MCG tablet Take 1 tablet (150 mcg total) by mouth daily. 90 tablet 3  . liothyronine (CYTOMEL) 5 MCG tablet Take 1 tablet (5 mcg total) by mouth daily. 90 tablet 0  . magnesium oxide (MAG-OX) 400 MG tablet Take 1 tablet by mouth daily.    . meloxicam (MOBIC) 15 MG tablet Take 1 tablet (15 mg total) by mouth daily. 90 tablet 3  . metoprolol succinate (TOPROL-XL) 25 MG 24 hr tablet Take 0.5 tablets (12.5 mg total) by mouth daily. 45 tablet 1  . Omega-3 Fatty Acids (FISH OIL PO) Take 1 capsule by mouth daily.    . potassium chloride SA (KLOR-CON) 20 MEQ tablet Take 1 tablet (20 mEq total) by  mouth daily. 90 tablet 3  . progesterone (PROMETRIUM) 100 MG capsule Take 1 capsule (100 mg total) by mouth every other day. 45 capsule 3  . rOPINIRole (REQUIP XL) 4 MG 24 hr tablet Take 1 tablet (4 mg total) by mouth daily. 90 tablet 3  . rOPINIRole (REQUIP) 2 MG tablet Take 1/2 tablet at lunch and 1/2 tablet at bed time. 90 tablet 3  . telmisartan-hydrochlorothiazide (MICARDIS HCT) 80-25 MG tablet Take 1 tablet by mouth daily. 90 tablet 1   No facility-administered medications prior to visit.    PAST MEDICAL HISTORY: Past Medical History:  Diagnosis Date  . Anxiety 01/12/2013  . Arthralgia 07/08/2016  . Asthma   . Atrial fibrillation ([place]) 12/06/2018  . B12 deficiency 01/31/2018  . Chicken pox   . Chronic insomnia 11/24/2016  . Clostridium difficile  infection 04/02/2014, 02/06/2014  . Complex tear of medial meniscus of left knee as current injury 12/01/2018  . DDD (degenerative disc disease), lumbar 09/09/2018  . Depression   . Diabetes mellitus without complication ([place]) 29/07/2839  . Dyslipidemia   . Elevated hemoglobin A1c 07/08/2016  . Estrogen deficiency 12/06/2018  . Foraminal stenosis of lumbar region 09/09/2018  . Hair loss 07/20/2017  . History of lumbar fusion 09/09/2018  . Hormone replacement therapy (HRT) 07/20/2017  . HTN (hypertension)   . Hypertension 07/08/2016  . Hypokalemia 12/06/2018  . Hypothyroidism   . Left medial tibial plateau fracture 11/10/2018  . Lumbar radiculopathy 09/09/2018   Referred to NS 09/09/2018  . Migraine   . Mild memory disturbance 11/13/2013  . Morbid obesity ([place]) 07/08/2016  . Multiple allergies   . Obesity   . Restless legs syndrome (RLS) 01/12/2013   Follows with Dr. Jannifer Franklin who prescribes Requip and gabapentin  . RLS (restless legs syndrome)   . Vitamin D deficiency 12/06/2018  . Wears glasses 07/08/2016    PAST SURGICAL HISTORY: Past Surgical History:  Procedure Laterality Date  . BREAST BIOPSY  3244,0102   x 2 benign lesion  . CERVICAL DISCECTOMY  2003  . CERVICAL FUSION  2010   x2  . Hand fracture surgery    . LUMBAR FUSION  2016  . VAGINAL HYSTERECTOMY  2009    FAMILY HISTORY: Family History  Problem Relation Age of Onset  . Hypertension Father   . Diabetes Father   . Heart disease Father   . Hearing loss Father   . Diabetes Brother   . Arthritis Mother   . Liver cancer Mother   . COPD Mother   . Mental illness Mother   . Diabetes Mother   . Hearing loss Mother   . Heart disease Mother     SOCIAL HISTORY: Social History   Socioeconomic History  . Marital status: Married    Spouse name: [name]  . Number of children: 2  . Years of education: COLLEGE  . Highest education level: Not on file  Occupational History  . Occupation: OWNER    Employer: [employer]    Tobacco Use  . Smoking status: Never Smoker  . Smokeless tobacco: Never Used  Vaping Use  . Vaping Use: Never used  Substance and Sexual Activity  . Alcohol use: No    Alcohol/week: 0.0 standard drinks  . Drug use: No  . Sexual activity: Yes    Partners: Male    Birth control/protection: None  Other Topics Concern  . Not on file  Social History Narrative   Patient is married [name])  2 children [name]  and Josh)   Patient is right handed.   Patient has a college education. 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    Patient drinks 1 cups daily. Uses herbal remedies, takes a daily vitamin.   Wears her seatbelt, smoke detector in the home.   Social Determinants of Health   Financial Resource Strain:   . Difficulty of Paying Living Expenses:   Food Insecurity:   . Worried About Charity fundraiser in the Last Year:   . Arboriculturist in the Last Year:   Transportation Needs:   . Film/video editor (Medical):   Marland Kitchen Lack of Transportation (Non-Medical):   Physical Activity:   . Days of Exercise per Week:   . Minutes of Exercise per Session:   Stress:   . Feeling of Stress :   Social Connections:   . Frequency of Communication with Friends and Family:   . Frequency of Social Gatherings with Friends and Family:   . Attends Religious Services:   . Active Member of Clubs or Organizations:   . Attends Archivist Meetings:   Marland Kitchen Marital Status:   Intimate Partner Violence:   . Fear of Current or Ex-Partner:   . Emotionally Abused:   Marland Kitchen Physically Abused:   . Sexually Abused:       PHYSICAL EXAM  There were no vitals filed for this visit. There is no height or weight on file to calculate BMI.  Generalized: Well developed, in no acute distress   Neurological examination  Mentation: Alert oriented to time, place, history taking. Follows all commands speech and language fluent Cranial nerve II-XII: Pupils were equal round reactive to light. Extraocular movements were full,  visual field were full on confrontational test. Facial sensation and strength were normal. Uvula tongue midline. Head turning and shoulder shrug  were normal and symmetric. Motor: The motor testing reveals 5 over 5 strength of all 4 extremities. Good symmetric motor tone is noted throughout.  Sensory: Sensory testing is intact to soft touch on all 4 extremities. No evidence of extinction is noted.  Coordination: Cerebellar testing reveals good finger-nose-finger and heel-to-shin bilaterally.  Gait and station: Gait is normal. Tandem gait is normal. Romberg is negative. No drift is seen.  Reflexes: Deep tendon reflexes are symmetric and normal bilaterally.   DIAGNOSTIC DATA (LABS, IMAGING, TESTING) - I reviewed patient records, labs, notes, testing and imaging myself where available.  Lab Results  Component Value Date   WBC 10.7 01/18/2019   HGB 13.9 01/18/2019   HCT 40.2 01/18/2019   MCV 88.5 01/18/2019   PLT 334 01/18/2019      Component Value Date/Time   NA 139 01/17/2019 1204   K 4.0 01/17/2019 1204   CL 99 01/17/2019 1204   CO2 22 01/17/2019 1204   GLUCOSE 100 (H) 01/17/2019 1204   GLUCOSE 95 12/06/2018 0919   BUN 17 01/17/2019 1204   CREATININE 0.61 01/17/2019 1204   CALCIUM 9.7 01/17/2019 1204   PROT 6.4 12/06/2018 0919   ALBUMIN 3.9 12/06/2018 0919   AST 11 12/06/2018 0919   ALT 21 12/06/2018 0919   ALKPHOS 75 12/06/2018 0919   BILITOT 0.4 12/06/2018 0919   GFRNONAA 105 01/17/2019 1204   GFRAA 121 01/17/2019 1204   Lab Results  Component Value Date   CHOL 150 08/02/2018   HDL 48.60 08/02/2018   LDLCALC 65 08/02/2018   TRIG 183.0 (H) 08/02/2018   CHOLHDL 3 08/02/2018   Lab Results  Component Value Date  HGBA1C 6.0 (H) 06/14/2019   Lab Results  Component Value Date   VITAMINB12 315 01/25/2018   Lab Results  Component Value Date   TSH 4.51 (H) 06/14/2019      ASSESSMENT AND PLAN 54 y.o. year old female  has a past medical history of Anxiety  (01/12/2013), Arthralgia (07/08/2016), Asthma, Atrial fibrillation ([place]) (12/06/2018), B12 deficiency (01/31/2018), Chicken pox, Chronic insomnia (11/24/2016), Clostridium difficile infection (04/02/2014, 02/06/2014), Complex tear of medial meniscus of left knee as current injury (12/01/2018), DDD (degenerative disc disease), lumbar (09/09/2018), Depression, Diabetes mellitus without complication ([place]) (45/09/996), Dyslipidemia, Elevated hemoglobin A1c (07/08/2016), Estrogen deficiency (12/06/2018), Foraminal stenosis of lumbar region (09/09/2018), Hair loss (07/20/2017), History of lumbar fusion (09/09/2018), Hormone replacement therapy (HRT) (07/20/2017), HTN (hypertension), Hypertension (07/08/2016), Hypokalemia (12/06/2018), Hypothyroidism, Left medial tibial plateau fracture (11/10/2018), Lumbar radiculopathy (09/09/2018), Migraine, Mild memory disturbance (11/13/2013), Morbid obesity ([place]) (07/08/2016), Multiple allergies, Obesity, Restless legs syndrome (RLS) (01/12/2013), RLS (restless legs syndrome), Vitamin D deficiency (12/06/2018), and Wears glasses (07/08/2016). here with ***   I spent 15 minutes with the patient. 50% of this time was spent   Butler Denmark, [place], DNP 08/15/2019, 5:52 AM [facility] [address] [place], [place] [address] [phone]

## 2019-08-21 ENCOUNTER — Encounter: Payer: Self-pay | Admitting: Neurology

## 2019-08-21 ENCOUNTER — Ambulatory Visit (INDEPENDENT_AMBULATORY_CARE_PROVIDER_SITE_OTHER): Payer: 59 | Admitting: Neurology

## 2019-08-21 VITALS — BP 103/65 | HR 72 | Ht 63.0 in | Wt 246.0 lb

## 2019-08-21 DIAGNOSIS — G2581 Restless legs syndrome: Secondary | ICD-10-CM | POA: Diagnosis not present

## 2019-08-21 MED ORDER — ROPINIROLE HCL ER 4 MG PO TB24: 4.0000 mg | ORAL_TABLET | Freq: Every day | ORAL | 3 refills | Status: DC

## 2019-08-21 MED ORDER — HORIZANT 300 MG PO TBCR: EXTENDED_RELEASE_TABLET | ORAL | 6 refills | Status: DC

## 2019-08-21 MED ORDER — ROPINIROLE HCL 2 MG PO TABS: ORAL_TABLET | ORAL | 3 refills | Status: DC

## 2019-08-21 NOTE — Progress Notes (Signed)
[name] DOB: 12/05/1965  REASON FOR VISIT: follow up HISTORY FROM: patient  HISTORY OF PRESENT ILLNESS: Today 08/21/19  [name] [name] is a 54 year old female history of restless leg syndrome.  Her symptoms are under good control as long as she takes her medications. She is taking Horizant 300 mg at 7 AM, again at 7 PM.  Requip ER 4 mg at 7 AM, Requip IR 2 mg at 7 PM.  If she takes her medications on schedule, she has no issues.  She needs refills.  Is tolerating without side effect.  She has done well to lose weight, is eating healthy, A1c is improved to 6.0.  Presents today for evaluation unaccompanied.  HISTORY 08/09/2018 [name] [name] is a 54 year old female with history of restless leg syndrome.  She reports her symptoms are under good control if she takes her medications.  She says she has to take medications every 4 hours, if she waits 13 hours, she will have symptoms.  She is currently taking Horizant 300 mg at 7 AM, again at 7 PM.  She is taking Requip ER.4 mg at 7 AM, Requip IR 2 mg at 7 PM.  If she takes her medications on schedule, she does not have any issues.  She indicates she needs refills of her medications.  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  Recently she has been diagnosed with diabetes, had an A1c of 6.5, was started on metformin.   REVIEW OF SYSTEMS: Out of a complete 14 system review of symptoms, the patient complains only of the following symptoms, and all other reviewed systems are negative.  Restless legs  ALLERGIES: Allergies  Allergen Reactions  . Bee Venom Swelling and Anaphylaxis  . Biaxin [Clarithromycin] Diarrhea and Rash  . Gabapentin Nausea And Vomiting    No reaction to Horizant  . Abilify [Aripiprazole] Other (See Comments)    MOOD SWING  . Buspar [Buspirone] Diarrhea  . Lisinopril Cough  . Maxzide [Triamterene-Hctz] Rash  . Phentermine Other (See Comments)    MOOD CHANGE   . Seroquel [Quetiapine Fumarate] Other (See Comments)    MOOD  SWINGS    HOME MEDICATIONS: Outpatient Medications Prior to Visit  Medication Sig Dispense Refill  . apixaban (ELIQUIS) 5 MG TABS tablet Take 1 tablet (5 mg total) by mouth 2 (two) times daily. 60 tablet 11  . buPROPion (WELLBUTRIN XL) 300 MG 24 hr tablet Take 1 tablet (300 mg total) by mouth daily. 90 tablet 1  . cholecalciferol (VITAMIN D) 1000 units tablet Take 5,000 Units by mouth daily.     . DULoxetine (CYMBALTA) 60 MG capsule Take 1 capsule (60 mg total) by mouth daily. Needs office visit prior to anymore refills. 90 capsule 1  . estradiol (ESTRACE) 2 MG tablet Take 1 tablet (2 mg total) by mouth daily. 90 tablet 3  . levothyroxine (SYNTHROID) 150 MCG tablet Take 1 tablet (150 mcg total) by mouth daily. 90 tablet 3  . liothyronine (CYTOMEL) 5 MCG tablet Take 1 tablet (5 mcg total) by mouth daily. 90 tablet 0  . magnesium oxide (MAG-OX) 400 MG tablet Take 1 tablet by mouth daily.    . meloxicam (MOBIC) 15 MG tablet Take 1 tablet (15 mg total) by mouth daily. 90 tablet 3  . metoprolol succinate (TOPROL-XL) 25 MG 24 hr tablet Take 0.5 tablets (12.5 mg total) by mouth daily. 45 tablet 1  . Omega-3 Fatty Acids (FISH OIL PO) Take 1 capsule by mouth daily.    Marland Kitchen  potassium chloride SA (KLOR-CON) 20 MEQ tablet Take 1 tablet (20 mEq total) by mouth daily. 90 tablet 3  . progesterone (PROMETRIUM) 100 MG capsule Take 1 capsule (100 mg total) by mouth every other day. 45 capsule 3  . telmisartan-hydrochlorothiazide (MICARDIS HCT) 80-25 MG tablet Take 1 tablet by mouth daily. 90 tablet 1  . Gabapentin Enacarbil ER (HORIZANT) 300 MG TBCR TAKE 2 TABLETS BY MOUTH EVERY DAY AT 5PM WITH FOOD 60 tablet 6  . rOPINIRole (REQUIP XL) 4 MG 24 hr tablet Take 1 tablet (4 mg total) by mouth daily. 90 tablet 3  . rOPINIRole (REQUIP) 2 MG tablet Take 1/2 tablet at lunch and 1/2 tablet at bed time. 90 tablet 3   No facility-administered medications prior to visit.    PAST MEDICAL HISTORY: Past Medical History:    Diagnosis Date  . Anxiety 01/12/2013  . Arthralgia 07/08/2016  . Asthma   . Atrial fibrillation ([place]) 12/06/2018  . B12 deficiency 01/31/2018  . Chicken pox   . Chronic insomnia 11/24/2016  . Clostridium difficile infection 04/02/2014, 02/06/2014  . Complex tear of medial meniscus of left knee as current injury 12/01/2018  . DDD (degenerative disc disease), lumbar 09/09/2018  . Depression   . Diabetes mellitus without complication ([place]) 71/0/6269  . Dyslipidemia   . Elevated hemoglobin A1c 07/08/2016  . Estrogen deficiency 12/06/2018  . Foraminal stenosis of lumbar region 09/09/2018  . Hair loss 07/20/2017  . History of lumbar fusion 09/09/2018  . Hormone replacement therapy (HRT) 07/20/2017  . HTN (hypertension)   . Hypertension 07/08/2016  . Hypokalemia 12/06/2018  . Hypothyroidism   . Left medial tibial plateau fracture 11/10/2018  . Lumbar radiculopathy 09/09/2018   Referred to NS 09/09/2018  . Migraine   . Mild memory disturbance 11/13/2013  . Morbid obesity ([place]) 07/08/2016  . Multiple allergies   . Obesity   . Restless legs syndrome (RLS) 01/12/2013   Follows with Dr. Jannifer Franklin who prescribes Requip and gabapentin  . RLS (restless legs syndrome)   . Vitamin D deficiency 12/06/2018  . Wears glasses 07/08/2016    PAST SURGICAL HISTORY: Past Surgical History:  Procedure Laterality Date  . BREAST BIOPSY  4854,6270   x 2 benign lesion  . CERVICAL DISCECTOMY  2003  . CERVICAL FUSION  2010   x2  . Hand fracture surgery    . LUMBAR FUSION  2016  . VAGINAL HYSTERECTOMY  2009    FAMILY HISTORY: Family History  Problem Relation Age of Onset  . Hypertension Father   . Diabetes Father   . Heart disease Father   . Hearing loss Father   . Diabetes Brother   . Arthritis Mother   . Liver cancer Mother   . COPD Mother   . Mental illness Mother   . Diabetes Mother   . Hearing loss Mother   . Heart disease Mother     SOCIAL HISTORY: Social History   Socioeconomic History  . Marital  status: Married    Spouse name: [name]  . Number of children: 2  . Years of education: COLLEGE  . Highest education level: Not on file  Occupational History  . Occupation: OWNER    Employer: [employer]  Tobacco Use  . Smoking status: Never Smoker  . Smokeless tobacco: Never Used  Vaping Use  . Vaping Use: Never used  Substance and Sexual Activity  . Alcohol use: No    Alcohol/week: 0.0 standard drinks  . Drug use: No  .  Sexual activity: Yes    Partners: Male    Birth control/protection: None  Other Topics Concern  . Not on file  Social History Narrative   Patient is married [name])  2 children [name] and [place])   Patient is right handed.   Patient has a college education. 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    Patient drinks 1 cups daily. Uses herbal remedies, takes a daily vitamin.   Wears her seatbelt, smoke detector in the home.   Social Determinants of Health   Financial Resource Strain:   . Difficulty of Paying Living Expenses:   Food Insecurity:   . Worried About Charity fundraiser in the Last Year:   . Arboriculturist in the Last Year:   Transportation Needs:   . Film/video editor (Medical):   Marland Kitchen Lack of Transportation (Non-Medical):   Physical Activity:   . Days of Exercise per Week:   . Minutes of Exercise per Session:   Stress:   . Feeling of Stress :   Social Connections:   . Frequency of Communication with Friends and Family:   . Frequency of Social Gatherings with Friends and Family:   . Attends Religious Services:   . Active Member of Clubs or Organizations:   . Attends Archivist Meetings:   Marland Kitchen Marital Status:   Intimate Partner Violence:   . Fear of Current or Ex-Partner:   . Emotionally Abused:   Marland Kitchen Physically Abused:   . Sexually Abused:    PHYSICAL EXAM  Vitals:   08/21/19 1507  BP: 103/65  Pulse: 72  Weight: 246 lb (111.6 kg)  Height: 5\' 3"  (1.6 m)   Body mass index is 43.58 kg/m.  Generalized: Well developed, in no  acute distress   Neurological examination  Mentation: Alert oriented to time, place, history taking. Follows all commands speech and language fluent Cranial nerve II-XII: Pupils were equal round reactive to light. Extraocular movements were full, visual field were full on confrontational test. Facial sensation and strength were normal. Head turning and shoulder shrug were normal and symmetric. Motor: The motor testing reveals 5 over 5 strength of all 4 extremities. Good symmetric motor tone is noted throughout.  Sensory: Sensory testing is intact to soft touch on all 4 extremities. No evidence of extinction is noted.  Coordination: Cerebellar testing reveals good finger-nose-finger and heel-to-shin bilaterally.  Gait and station: Gait is normal. Tandem gait is slightly unsteady. Romberg is negative. No drift is seen.  Reflexes: Deep tendon reflexes are symmetric and normal bilaterally.   DIAGNOSTIC DATA (LABS, IMAGING, TESTING) - I reviewed patient records, labs, notes, testing and imaging myself where available.  Lab Results  Component Value Date   WBC 10.7 01/18/2019   HGB 13.9 01/18/2019   HCT 40.2 01/18/2019   MCV 88.5 01/18/2019   PLT 334 01/18/2019      Component Value Date/Time   NA 139 01/17/2019 1204   K 4.0 01/17/2019 1204   CL 99 01/17/2019 1204   CO2 22 01/17/2019 1204   GLUCOSE 100 (H) 01/17/2019 1204   GLUCOSE 95 12/06/2018 0919   BUN 17 01/17/2019 1204   CREATININE 0.61 01/17/2019 1204   CALCIUM 9.7 01/17/2019 1204   PROT 6.4 12/06/2018 0919   ALBUMIN 3.9 12/06/2018 0919   AST 11 12/06/2018 0919   ALT 21 12/06/2018 0919   ALKPHOS 75 12/06/2018 0919   BILITOT 0.4 12/06/2018 0919   GFRNONAA 105 01/17/2019 1204   GFRAA 121 01/17/2019  1204   Lab Results  Component Value Date   CHOL 150 08/02/2018   HDL 48.60 08/02/2018   LDLCALC 65 08/02/2018   TRIG 183.0 (H) 08/02/2018   CHOLHDL 3 08/02/2018   Lab Results  Component Value Date   HGBA1C 6.0 (H)  06/14/2019   Lab Results  Component Value Date   VITAMINB12 315 01/25/2018   Lab Results  Component Value Date   TSH 4.51 (H) 06/14/2019      ASSESSMENT AND PLAN 54 y.o. year old female  has a past medical history of Anxiety (01/12/2013), Arthralgia (07/08/2016), Asthma, Atrial fibrillation ([place]) (12/06/2018), B12 deficiency (01/31/2018), Chicken pox, Chronic insomnia (11/24/2016), Clostridium difficile infection (04/02/2014, 02/06/2014), Complex tear of medial meniscus of left knee as current injury (12/01/2018), DDD (degenerative disc disease), lumbar (09/09/2018), Depression, Diabetes mellitus without complication ([place]) (29/05/7652), Dyslipidemia, Elevated hemoglobin A1c (07/08/2016), Estrogen deficiency (12/06/2018), Foraminal stenosis of lumbar region (09/09/2018), Hair loss (07/20/2017), History of lumbar fusion (09/09/2018), Hormone replacement therapy (HRT) (07/20/2017), HTN (hypertension), Hypertension (07/08/2016), Hypokalemia (12/06/2018), Hypothyroidism, Left medial tibial plateau fracture (11/10/2018), Lumbar radiculopathy (09/09/2018), Migraine, Mild memory disturbance (11/13/2013), Morbid obesity ([place]) (07/08/2016), Multiple allergies, Obesity, Restless legs syndrome (RLS) (01/12/2013), RLS (restless legs syndrome), Vitamin D deficiency (12/06/2018), and Wears glasses (07/08/2016). here with:  1.  Restless leg syndrome  She continues to do quite well, as long as she adheres to her medication regimen.  She will remain on: -Horizant 300 mg, 7 AM, 7 PM -Requip XL 4 mg tablet, 7 AM -Requip IR 2 mg tablet, 7 PM  I have sent refills.  She will follow-up in 1 year or sooner if needed.  I spent 20 minutes of face-to-face and non-face-to-face time with patient.  This included previsit chart review, lab review, study review, order entry, electronic health record documentation, patient education.  Butler Denmark, AGNP-C, DNP 08/21/2019, 3:31 PM [facility] [address] [place],  [place] [address] [phone]

## 2019-08-21 NOTE — Progress Notes (Signed)
I have read the note, and I agree with the clinical assessment and plan.  Jacquees Gongora K Shamera Yarberry   

## 2019-08-21 NOTE — Patient Instructions (Signed)
Continue current medications  See you back in 1 year

## 2019-09-01 ENCOUNTER — Other Ambulatory Visit: Payer: Self-pay | Admitting: Family Medicine

## 2019-09-19 ENCOUNTER — Telehealth: Payer: Self-pay | Admitting: Neurology

## 2019-09-19 NOTE — Telephone Encounter (Signed)
Received PA request for ropinirole ER 4mg . PA was started on [email or web address]. Key is [id number]. Will receive determination with 72 hours.

## 2019-09-21 NOTE — Telephone Encounter (Signed)
Appeals form has been completed. Will have Sarah to sign and back to [employer].

## 2019-09-21 NOTE — Telephone Encounter (Signed)
Yes, please appeal, this is not new medication for her.

## 2019-09-21 NOTE — Telephone Encounter (Signed)
Received determination fax from [employer]. PA was denied based on the fact that the patient does not have Parkinson's Disease but instead has restless leg syndrome. They also stated that the PA was denied due to her not trying and failing pramipexole (she has tried and failed this, this information was submitted with the original PA).   Judson Roch, would you like for me to appeal this decision? I have the determination fax if you want to review it. Thanks!

## 2019-09-25 ENCOUNTER — Telehealth: Payer: Self-pay | Admitting: Neurology

## 2019-09-25 NOTE — Telephone Encounter (Signed)
[place] called to verify if Pt has been diagnosed with Parkinson also to verify Pt prescribed rOPINIRole (REQUIP XL) 4 MG 24 hr tablet.

## 2019-09-25 NOTE — Telephone Encounter (Signed)
[place] and spoke with Janett Billow. She stated that the pharmacist wanted to verify the patient has PD. I advised her that the patient does not, she does have RLS and has been on the Requip since 2016. She verbalized understanding and will submit the info the pharmacist.   Nothing further needed at time of call.

## 2019-09-28 NOTE — Telephone Encounter (Signed)
Received appeal determination for ropinirole ER 4mg . PA was still denied. PA was denied due to the fact the patient does not have a diagnosis of parkinsons disease. Information was also faxed to [facility] stating that she has tried and failed ropinirole immediate release and pramipexole immediate. Per the insurance, she has to try and fail these medications with their plan. When she tried this medications in the past, she was under a different plan.   I have saved a copy of the determination letter.   Judson Roch, can you please advise? Thanks!

## 2019-10-02 NOTE — Telephone Encounter (Signed)
I checked good rx, she can get 30 tablets of ropinriole ER 4 mg for $20 at [employer], or $ 30 at [facility], [facility] is $15  Current medication regimen: She continues to do quite well, as long as she adheres to her medication regimen.  She will remain on: -Horizant 300 mg, 7 AM, 7 PM -Requip XL 4 mg tablet, 7 AM -Requip IR 2 mg tablet, 7 PM  In response to the denial, she is already taking Requip IR and it is not enough-so that should meet that criteria. I could send in mirapex to try in place of requip IR? Also, option to call and speak to someone to appeal this? She can pay cash price as listed above.

## 2019-10-10 ENCOUNTER — Ambulatory Visit (INDEPENDENT_AMBULATORY_CARE_PROVIDER_SITE_OTHER): Payer: 59 | Admitting: Family Medicine

## 2019-10-10 ENCOUNTER — Encounter: Payer: Self-pay | Admitting: Family Medicine

## 2019-10-10 VITALS — Wt 240.0 lb

## 2019-10-10 DIAGNOSIS — H9210 Otorrhea, unspecified ear: Secondary | ICD-10-CM

## 2019-10-10 DIAGNOSIS — R059 Cough, unspecified: Secondary | ICD-10-CM

## 2019-10-10 DIAGNOSIS — R05 Cough: Secondary | ICD-10-CM | POA: Diagnosis not present

## 2019-10-10 DIAGNOSIS — J989 Respiratory disorder, unspecified: Secondary | ICD-10-CM | POA: Diagnosis not present

## 2019-10-10 MED ORDER — BENZONATATE 100 MG PO CAPS: 100.0000 mg | ORAL_CAPSULE | Freq: Three times a day (TID) | ORAL | 0 refills | Status: DC | PRN

## 2019-10-10 NOTE — Progress Notes (Signed)
Virtual Visit via Telephone Note  I connected with [name] [name] on 10/10/19 at 12:00 PM EDT by telephone and verified that I am speaking with the correct person using two identifiers.   I discussed the limitations, risks, security and privacy concerns of performing an evaluation and management service by telephone and the availability of in person appointments. I also discussed with the patient that there may be a patient responsible charge related to this service. The patient expressed understanding and agreed to proceed.  Location patient: home Location provider: work or home office Participants present for the call: patient, provider Patient did not have a visit in the prior 7 days to address this/these issue(s).   History of Present Illness:  Acute visit for sinus and ear issues: -symptoms started about 4 days ago -symptoms include: nasal congestion, bad cough, subjective fever a few days ago, R ear stopped up - feels uncomfortable and full -denies NVD, loss or taste or smell, drainage from the ear, chest pain or SOB, body aches -no known sick contacts -no hx of recurrent ear infections -not vaccinated for [place] -she had a negative COVID19 home test yesterday   Observations/Objective: Patient sounds cheerful and well on the phone. I do not appreciate any SOB. Speech and thought processing are grossly intact. Patient reported vitals:  Assessment and Plan:  Respiratory illness  Cough  Ear discharge, unspecified laterality  -we discussed possible serious and likely etiologies, options for evaluation and workup, limitations of telemedicine visit vs in person visit, treatment, treatment risks and precautions. Pt prefers to treat via telemedicine empirically rather then risking or undertaking an in person visit at this moment. Query VURI vs other. COVID19 a possibility and given symptoms advised PCR test to confirm neg rapid home antigen test. Trial Tessalon - rx sent, short  course afrin nasal spray x 3 days, nasal saline for possible ETD. Discussed if ear not improving in next few days or worsening to seek follow up or inperson care to evaluation. Patient agrees to seek prompt in person care if worsening, new symptoms arise, or if is not improving with treatment.  Follow Up Instructions:   I did not refer this patient for an OV in the next 24 hours for this/these issue(s).  I discussed the assessment and treatment plan with the patient. The patient was provided an opportunity to ask questions and all were answered. The patient agreed with the plan and demonstrated an understanding of the instructions.   The patient was advised to call back or seek an in-person evaluation if the symptoms worsen or if the condition fails to improve as anticipated.  I provided 15 minutes of non-face-to-face time during this encounter.   Lucretia Kern, DO

## 2019-11-17 ENCOUNTER — Other Ambulatory Visit: Payer: Self-pay | Admitting: Family Medicine

## 2019-11-17 DIAGNOSIS — F339 Major depressive disorder, recurrent, unspecified: Secondary | ICD-10-CM

## 2019-11-17 DIAGNOSIS — I10 Essential (primary) hypertension: Secondary | ICD-10-CM

## 2019-12-02 IMAGING — MR MRI LUMBAR SPINE WITHOUT CONTRAST
4 of 5 series · 27 of 48 positions shown · non-contrast
Comparison: Lumbar radiographs 12/14/2017. Outside lumbar MRI
05/29/2014.

CLINICAL DATA: 53-year-old female with back pain. Recently treated
with steroids. Previous spine surgery.

EXAM:
MRI LUMBAR SPINE WITHOUT CONTRAST
TECHNIQUE: Multiplanar, multisequence MR imaging of the lumbar spine was
performed. No intravenous contrast was administered.

[Series 5: T2 · sagittal · 4.0mm · 0.73mm/px · 8 of 16 slices shown (1 of 2)]
[im 1/16]
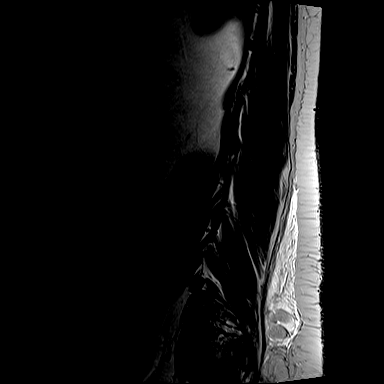
[im 3/16]
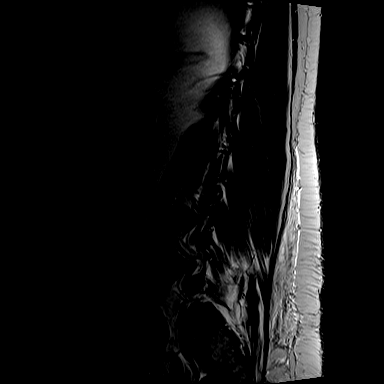
[im 5/16]
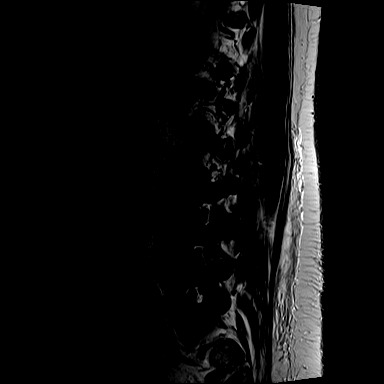
[im 7/16]
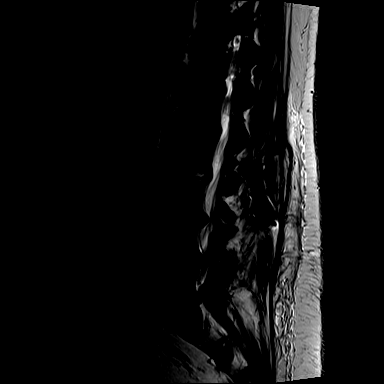
[im 9/16]
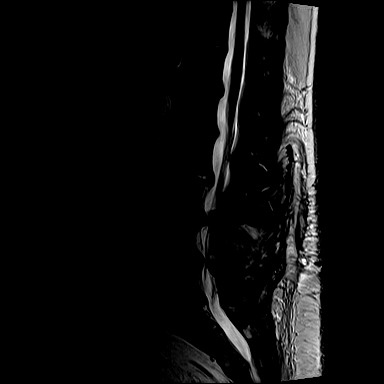
[im 11/16]
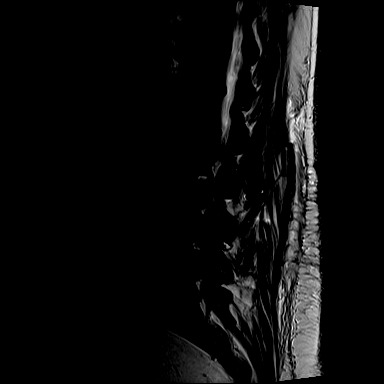
[im 13/16]
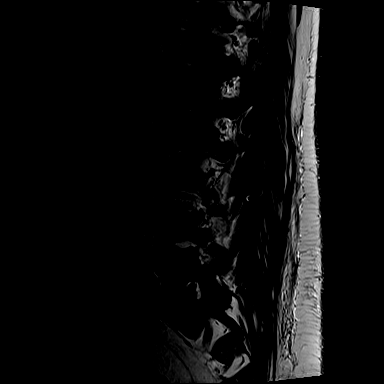
[im 16/16]
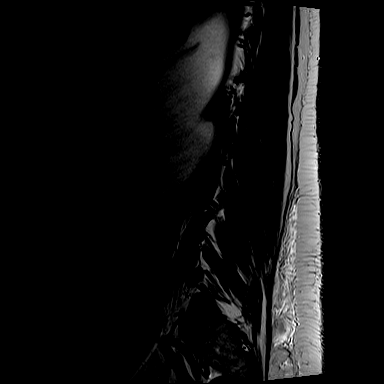

[Series 7: T1 · sagittal · 4.0mm · 0.88mm/px · 7 of 16 slices shown (1 of 2)]
[im 1/16]
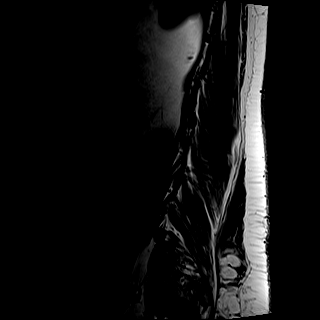
[im 3/16]
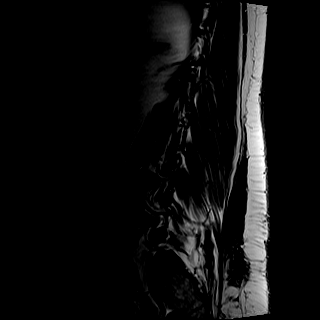
[im 6/16]
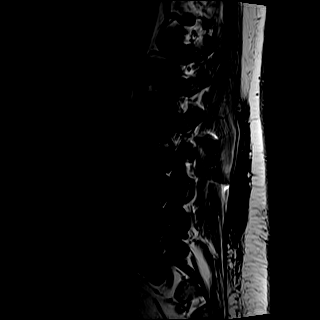
[im 8/16]
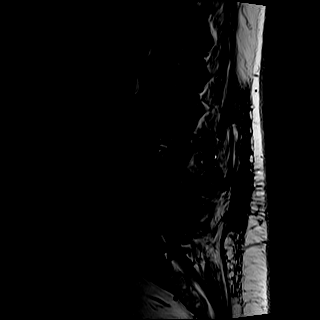
[im 11/16]
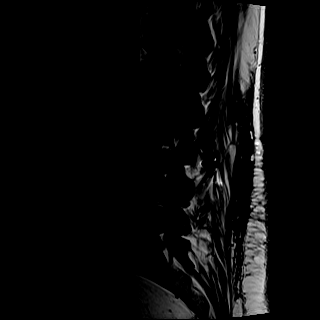
[im 13/16]
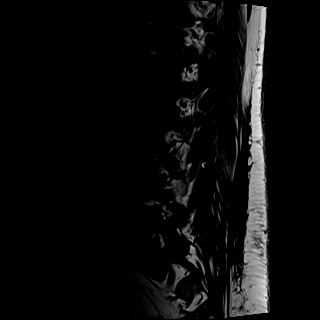
[im 16/16]
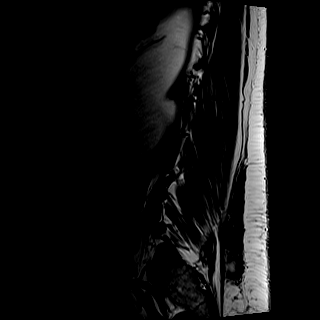

[Series 8: T2 · axial · 4.0mm · 0.57mm/px · z∈[-20,+143]mm · 9 of 30 slices shown (2 of 2)]
[im 1/30]
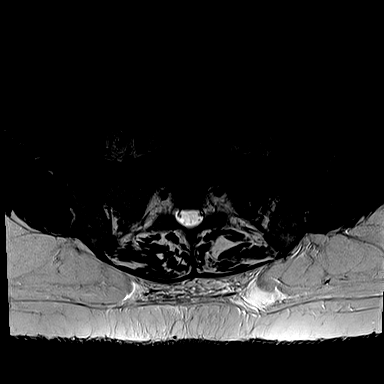
[im 5/30]
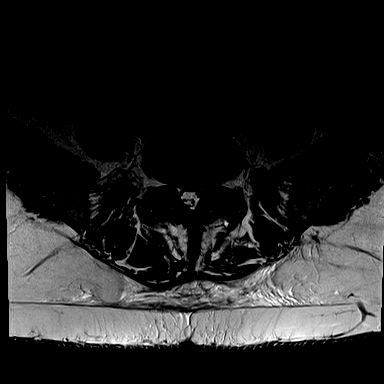
[im 10/30]
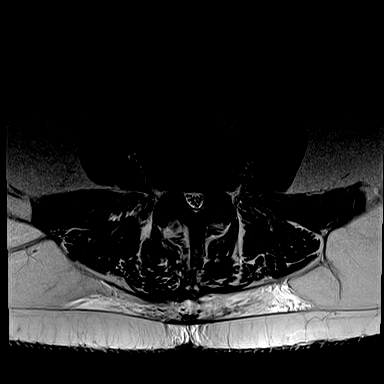
[im 13/30]
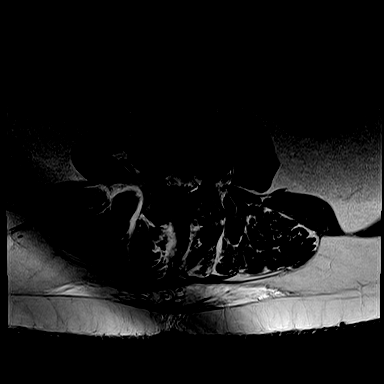
[im 15/30]
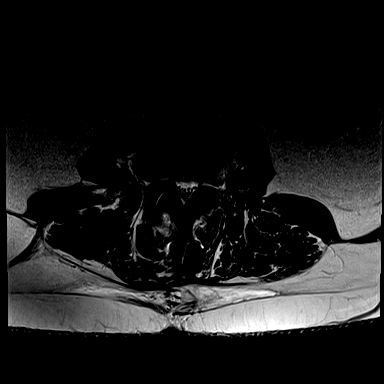
[im 17/30]
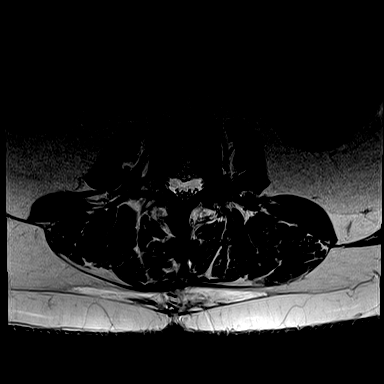
[im 20/30]
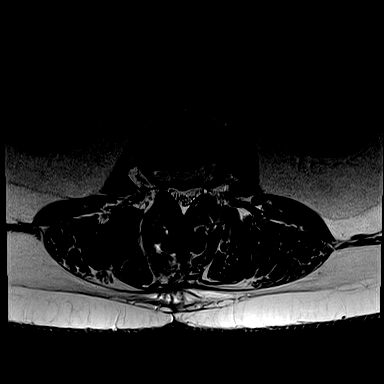
[im 25/30]
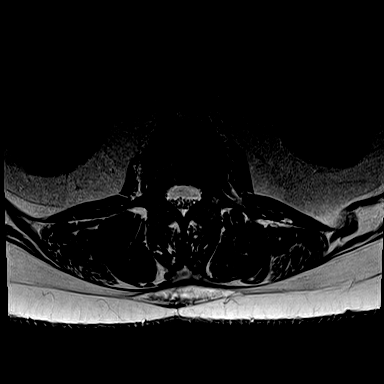
[im 30/30]
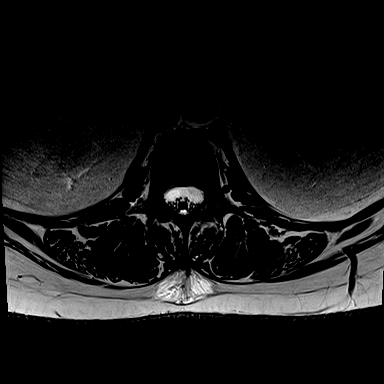

[Series 9: T1 · axial · 4.0mm · 0.34mm/px · z∈[+3,+118]mm · 3 of 30 slices shown (2 of 2)]
[im 5/30]
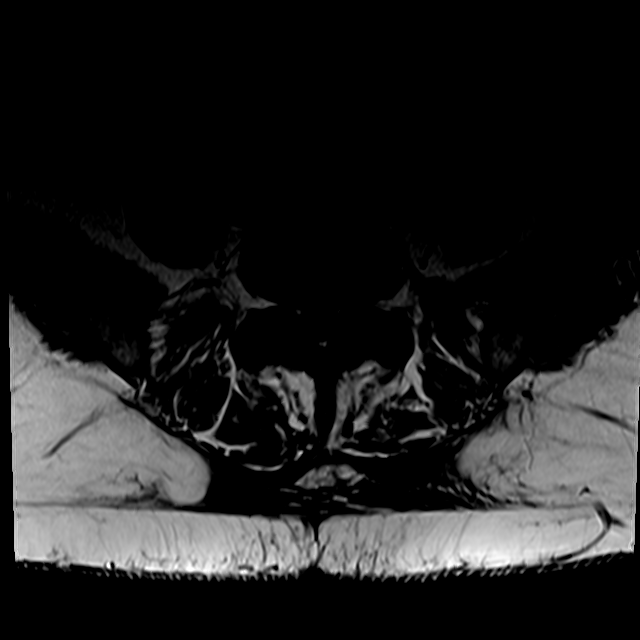
[im 15/30]
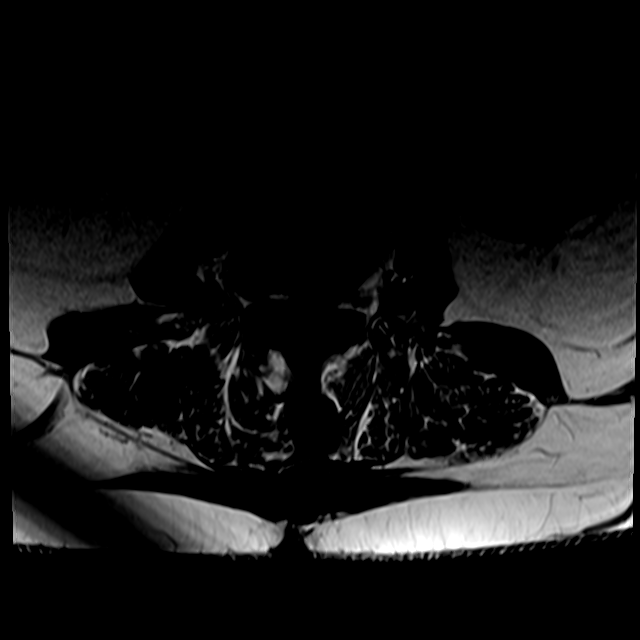
[im 25/30]
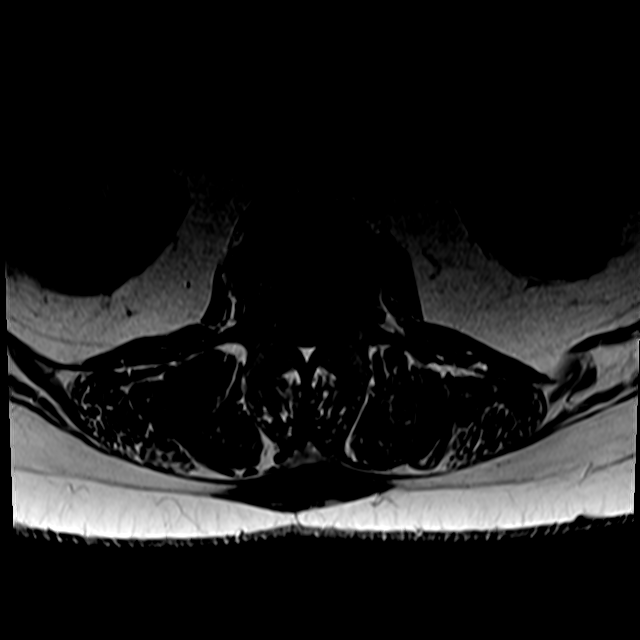

[27 of 48 positions shown; findings below may reference images not displayed]

FINDINGS: Segmentation:  Normal on the comparison radiographs.

Alignment: Chronic straightening of lumbar lordosis. Recommend
follow-up with spine surgery.

Vertebrae: Chronic interbody an interspinous implants at L2-L3 with
mild hardware susceptibility. Degenerative appearing mild endplate
and right pedicle marrow edema at L3-L4 is new since 0110 (series 6,
image 6). Normal background bone marrow signal. Intact visible
sacrum and SI joints.

Conus medullaris and cauda equina: Conus extends to the L1 level. No
lower spinal cord or conus signal abnormality.

Paraspinal and other soft tissues: Chronic subcutaneous edema in the
posterior lumbar soft tissues. Mild superimposed postoperative
changes to the posterior paraspinal soft tissues at L3-L4. negative
visible abdominal viscera.

Disc levels:

T11-T12: Negative.

T12-L1: Increased broad-based central or circumferential disc bulge.
No significant stenosis.

L1-L2: Chronic circumferential disc bulge and mild posterior element
hypertrophy. No significant stenosis.

L2-L3: Chronic interbody and interspinous implants. Chronic endplate
spurring including a right paracentral component on series 8, image
12. Mild facet hypertrophy. Stable borderline to mild spinal and
right L2 foraminal stenosis.

L3-L4: Increased chronic circumferential disc bulge with broad-based
posterior component. Progressed and moderate to severe posterior
element hypertrophy. Increased and moderate to severe spinal and
right lateral recess stenosis (series 8, image 17). Mild to moderate
left lateral recess stenosis. Up to moderate new left and increased
moderate right L3 foraminal stenosis.

L4-L5: Sequelae of laminectomy since 0110. Chronic circumferential
disc bulge with broad-based posterior component eccentric to the
left. Increased residual posterior element hypertrophy, moderate to
severe. Trace degenerative facet joint fluid. Moderate spinal
stenosis has improved since 0110. Mild to moderate left greater than
right lateral recess stenosis also has improved. Moderate bilateral
L4 foraminal stenosis appears stable.

L5-S1: Chronic circumferential disc bulge with broad-based central
posterior component. Moderate posterior element hypertrophy. Stable
mild bilateral lateral recess stenosis. No significant spinal
stenosis. Moderate left and mild right L5 foraminal stenosis is
increased.
IMPRESSION: 1. Chronic interbody and interspinous implants at L2-L3.
2. Progressed adjacent segment disease at L3-L4 since 0110 with
degenerative appearing marrow edema and multifactorial moderate to
severe spinal, right lateral recess, and moderate bilateral
foraminal stenosis.
3. Interval posterior decompression at L4-L5 with regressed but not
resolved spinal, lateral recess and foraminal stenosis at that
level.
4. Increased mild to moderate neural foraminal stenosis at L5-S1.

## 2019-12-05 ENCOUNTER — Encounter: Payer: Self-pay | Admitting: Family Medicine

## 2019-12-05 ENCOUNTER — Ambulatory Visit (INDEPENDENT_AMBULATORY_CARE_PROVIDER_SITE_OTHER): Payer: 59 | Admitting: Family Medicine

## 2019-12-05 ENCOUNTER — Other Ambulatory Visit: Payer: Self-pay

## 2019-12-05 VITALS — BP 123/62 | HR 82 | Temp 98.5°F | Ht 63.0 in | Wt 247.0 lb

## 2019-12-05 DIAGNOSIS — E039 Hypothyroidism, unspecified: Secondary | ICD-10-CM | POA: Diagnosis not present

## 2019-12-05 DIAGNOSIS — Z79890 Hormone replacement therapy (postmenopausal): Secondary | ICD-10-CM

## 2019-12-05 DIAGNOSIS — I10 Essential (primary) hypertension: Secondary | ICD-10-CM | POA: Diagnosis not present

## 2019-12-05 DIAGNOSIS — Z23 Encounter for immunization: Secondary | ICD-10-CM | POA: Diagnosis not present

## 2019-12-05 DIAGNOSIS — E2839 Other primary ovarian failure: Secondary | ICD-10-CM

## 2019-12-05 DIAGNOSIS — R7309 Other abnormal glucose: Secondary | ICD-10-CM | POA: Diagnosis not present

## 2019-12-05 DIAGNOSIS — M2550 Pain in unspecified joint: Secondary | ICD-10-CM

## 2019-12-05 DIAGNOSIS — L659 Nonscarring hair loss, unspecified: Secondary | ICD-10-CM

## 2019-12-05 DIAGNOSIS — G2581 Restless legs syndrome: Secondary | ICD-10-CM

## 2019-12-05 DIAGNOSIS — I480 Paroxysmal atrial fibrillation: Secondary | ICD-10-CM

## 2019-12-05 DIAGNOSIS — Z1231 Encounter for screening mammogram for malignant neoplasm of breast: Secondary | ICD-10-CM

## 2019-12-05 DIAGNOSIS — E785 Hyperlipidemia, unspecified: Secondary | ICD-10-CM

## 2019-12-05 DIAGNOSIS — F339 Major depressive disorder, recurrent, unspecified: Secondary | ICD-10-CM

## 2019-12-05 LAB — POCT GLYCOSYLATED HEMOGLOBIN (HGB A1C)
HbA1c POC (<> result, manual entry): 5.5 % (ref 4.0–5.6)
HbA1c, POC (controlled diabetic range): 5.5 % (ref 0.0–7.0)
HbA1c, POC (prediabetic range): 5.5 % — AB (ref 5.7–6.4)
Hemoglobin A1C: 5.5 % (ref 4.0–5.6)

## 2019-12-05 MED ORDER — DULOXETINE HCL 60 MG PO CPEP: 60.0000 mg | ORAL_CAPSULE | Freq: Every day | ORAL | 1 refills | Status: DC

## 2019-12-05 MED ORDER — TELMISARTAN-HCTZ 80-25 MG PO TABS: 1.0000 | ORAL_TABLET | Freq: Every day | ORAL | 1 refills | Status: DC

## 2019-12-05 MED ORDER — BUPROPION HCL ER (XL) 300 MG PO TB24: 300.0000 mg | ORAL_TABLET | Freq: Every day | ORAL | 1 refills | Status: DC

## 2019-12-05 MED ORDER — METOPROLOL SUCCINATE ER 25 MG PO TB24
12.5000 mg | ORAL_TABLET | Freq: Every day | ORAL | 1 refills | Status: DC
Start: 2019-12-05 — End: 2020-08-09

## 2019-12-05 NOTE — Progress Notes (Signed)
Patient Care Team    Relationship Specialty Notifications Start End  [name], DO PCP - General Family Medicine  07/07/16   Nahser, Wonda Cheng, MD PCP - Cardiology Cardiology Admissions 12/15/18   Ob/Gyn, [name]    07/08/16   Wilford Corner, MD Consulting Physician Gastroenterology  07/08/16   [name], MD Consulting Physician Neurology  07/08/16   [name], MD Attending Physician Endocrinology  07/08/16    Comment: Pt established with Si Raider, NP at this location. - Integrative med- thyroid  [name], MD Consulting Physician Rehabilitation  03/06/19    Comment: Neurosurgeon-spine and scoliosis specialist    SUBJECTIVE Chief Complaint  Patient presents with  . Follow-up    [facility]    HPI: [name] [name] is a 54 y.o. female present for [facility] follow up.  Recurrent major depressive disorder, remission status unspecified (HCC) Reports  compliance with Wellbutrin 300 mg daily and Cymbalta 60 mg daily. She is tolerating these medications well without any side effects.  Feels she is doing well with these medications at current doses.  Arthralgia, unspecified joint/lumbar degenerative disc disease/lumbar radiculopathy, foraminal stenosis of lumbar region Patient has been prescribed Mobic for her arthralgias in the past.  She is also prescribed Requip and gabapentin for her restless leg syndrome through neurology.  Mobic is still on her active med list, will need to confirm since she is now on blood thinners, Mobic should be discontinued.  Patient is prescribed Cymbalta 60 mg daily.  Hypertension/Morbid obesity/A.  Fib:Pt reports compliance withmicardis80-25 mg. Patient denies chest pain, shortness of breath, dizziness or lower extremity edema.  Patient is on apixaban for A. fib pt is notprescribed statin. Labs up-to-date 01/2019 Diet/exercise does not routinely watch diet and exercise QQ:[id number], obesity,fhxheart disease  Pre-diabetes: Patient had  an A1c of 6.5.  Metformin was prescribed however patient could not tolerate.  Her last A1c was 6.3 off of medications and today it is 5.5 without medications.  She is doing excellent with diet control.  Her fasting blood glucoses are reported to be around 200.   Hypothyroidism/Vit D def/HRT:  She reports compliance with levothyroxine 150 mcg daily on empty stomach and Cytomel 5 mcg daily.  She reports compliance with her estrogen and progesterone supplementations.  Her mammogram is up-to-date.  She is reporting hot flashes since starting the Cytomel.  Her iron levels have been normal, vitamin D in the low normal.  She has felt in the past the Cytomel has been helpful for her hair loss.   ROS: See pertinent positives and negatives per HPI.  Patient Active Problem List   Diagnosis Date Noted  . Hypokalemia 12/06/2018  . Atrial fibrillation ([place]) 12/06/2018  . Vitamin D deficiency 12/06/2018  . Estrogen deficiency 12/06/2018  . DDD (degenerative disc disease), lumbar 09/09/2018  . Foraminal stenosis of lumbar region 09/09/2018  . History of lumbar fusion 09/09/2018  . Lumbar radiculopathy 09/09/2018  . B12 deficiency 01/31/2018  . Hormone replacement therapy (HRT) 07/20/2017  . Hair loss 07/20/2017  . Chronic insomnia 11/24/2016  . Elevated hemoglobin A1c 07/08/2016  . Hypertension 07/08/2016  . Arthralgia 07/08/2016  . Morbid obesity ([place]) 07/08/2016  . Wears glasses 07/08/2016  . Hypothyroidism   . Dyslipidemia   . Depression   . Mild memory disturbance 11/13/2013  . Restless legs syndrome (RLS) 01/12/2013    Social History   Tobacco Use  . Smoking status: Never Smoker  . Smokeless tobacco: Never Used  Substance Use Topics  . Alcohol use: No    Alcohol/week: 0.0 standard drinks    Current Outpatient Medications:  .  apixaban (ELIQUIS) 5 MG TABS tablet, Take 1 tablet (5 mg total) by mouth 2 (two) times daily., Disp: 60 tablet, Rfl: 11 .  buPROPion (WELLBUTRIN XL) 300 MG 24  hr tablet, Take 1 tablet (300 mg total) by mouth daily., Disp: 90 tablet, Rfl: 1 .  cholecalciferol (VITAMIN D) 1000 units tablet, Take 5,000 Units by mouth daily. , Disp: , Rfl:  .  DULoxetine (CYMBALTA) 60 MG capsule, Take 1 capsule (60 mg total) by mouth daily., Disp: 90 capsule, Rfl: 1 .  estradiol (ESTRACE) 2 MG tablet, Take 1 tablet (2 mg total) by mouth daily., Disp: 90 tablet, Rfl: 3 .  Gabapentin Enacarbil ER (HORIZANT) 300 MG TBCR, TAKE 2 TABLETS BY MOUTH EVERY DAY AT 5PM WITH FOOD, Disp: 60 tablet, Rfl: 6 .  magnesium oxide (MAG-OX) 400 MG tablet, Take 1 tablet by mouth daily., Disp: , Rfl:  .  metoprolol succinate (TOPROL-XL) 25 MG 24 hr tablet, Take 0.5 tablets (12.5 mg total) by mouth daily., Disp: 45 tablet, Rfl: 1 .  Omega-3 Fatty Acids (FISH OIL PO), Take 1 capsule by mouth daily., Disp: , Rfl:  .  potassium chloride SA (KLOR-CON) 20 MEQ tablet, Take 1 tablet (20 mEq total) by mouth daily., Disp: 90 tablet, Rfl: 3 .  progesterone (PROMETRIUM) 100 MG capsule, Take 1 capsule (100 mg total) by mouth every other day., Disp: 45 capsule, Rfl: 3 .  rOPINIRole (REQUIP XL) 4 MG 24 hr tablet, Take 1 tablet (4 mg total) by mouth daily., Disp: 90 tablet, Rfl: 3 .  rOPINIRole (REQUIP) 2 MG tablet, Take 1/2 tablet at lunch and 1/2 tablet at bed time., Disp: 90 tablet, Rfl: 3 .  telmisartan-hydrochlorothiazide (MICARDIS HCT) 80-25 MG tablet, Take 1 tablet by mouth daily., Disp: 90 tablet, Rfl: 1 .  levothyroxine (SYNTHROID) 150 MCG tablet, Take 1 tablet (150 mcg total) by mouth daily., Disp: 90 tablet, Rfl: 3 .  liothyronine (CYTOMEL) 5 MCG tablet, Take 0.5-1 tablets (2.5-5 mcg total) by mouth daily., Disp: 90 tablet, Rfl: 3  Allergies  Allergen Reactions  . Bee Venom Swelling and Anaphylaxis  . Biaxin [Clarithromycin] Diarrhea and Rash  . Gabapentin Nausea And Vomiting    No reaction to Horizant  . Abilify [Aripiprazole] Other (See Comments)    MOOD SWING  . Buspar [Buspirone] Diarrhea  .  Lisinopril Cough  . Maxzide [Triamterene-Hctz] Rash  . Phentermine Other (See Comments)    MOOD CHANGE   . Seroquel [Quetiapine Fumarate] Other (See Comments)    MOOD SWINGS    OBJECTIVE: BP 123/62   Pulse 82   Temp 98.5 F (36.9 C) (Oral)   Ht 5\' 3"  (1.6 m)   Wt 247 lb (112 kg)   SpO2 96%   BMI 43.75 kg/m  Gen: Afebrile. No acute distress.  Nontoxic in presentation, well-developed, well-nourished very pleasant female.  Appears very happy today. HENT: AT. [place].  No cough or shortness of breath Eyes:Pupils Equal Round Reactive to light, Extraocular movements intact,  Conjunctiva without redness, discharge or icterus. Neck/lymp/endocrine: Supple, no lymphadenopathy, no thyromegaly CV: RRR no murmur, no edema, +2/4 P posterior tibialis pulses Chest: CTAB, no wheeze or crackles Skin: No rashes, purpura or petechiae.  Neuro:  Normal gait. PERLA. EOMi. Alert. Oriented x3 Psych: Normal affect, dress and demeanor. Normal speech. Normal thought content and judgment.   ASSESSMENT AND PLAN:  SAHVANNA MCMANIGAL is a 54 y.o. female present for  Hypothyroidism/hair loss/HRT -Has been stable we will collect TSH, T4 and T3 today secondary to reports of hot flashes. - Continue levothyroxine 150 mcg QD.  -Discussed decreasing Cytomel dose if able after lab results to see if hot flashes are improved.  If they are not then we may have to remove Cytomel altogether versus gynecological referral for evaluation of hot flashes and her hormone therapy -Continue estrogen and progesterone -Mammogram  08/2018> ordered today.  Patient aware she needs yearly mammogram as long as on hormone therapy.  She reports understanding. -Follow-up 5.5 months  Recurrent major depressive disorder, remission status unspecified (HCC) Stable Continue Cymbalta 60 mg daily  Continue Wellbutrin 300 mg daily  Follow-up 5.5 months, sooner if needed   Arthralgia, unspecified joint Stable -Avoid NSAIDs with apixaban use -Iron  panel has been stable 01/2019.  -Requip and horizontal are prescribed by her neurology team. -Continue Cymbalta -Follow-up 5.5 months  Vitamin D deficiency: -Patient is taking 5000 units daily.   -Continue current supplementation  Essential hypertension/morbid obesity/PAF Stable. Continue micardis Continue metoprolol 12.5 mg daily Continue Eliquis prescribed by cardiology Continue to follow with cardiology Low sodium.  Follow-up 5.5 months  Elevated hemoglobin A1c Congratulated her she did a great job of bringing down her A1c with diet and exercise alone A1c: 6.5 > 6.3> 6.0> 5.5 today  Influenza vaccine administered today.   Orders Placed This Encounter  Procedures  . MM 3D SCREEN BREAST BILATERAL  . Flu Vaccine QUAD 6+ mos PF IM (Fluarix Quad PF)  . TSH  . T3, free  . T4, free  . POCT HgB A1C   Meds ordered this encounter  Medications  . telmisartan-hydrochlorothiazide (MICARDIS HCT) 80-25 MG tablet    Sig: Take 1 tablet by mouth daily.    Dispense:  90 tablet    Refill:  1  . metoprolol succinate (TOPROL-XL) 25 MG 24 hr tablet    Sig: Take 0.5 tablets (12.5 mg total) by mouth daily.    Dispense:  45 tablet    Refill:  1  . DULoxetine (CYMBALTA) 60 MG capsule    Sig: Take 1 capsule (60 mg total) by mouth daily.    Dispense:  90 capsule    Refill:  1  . buPROPion (WELLBUTRIN XL) 300 MG 24 hr tablet    Sig: Take 1 tablet (300 mg total) by mouth daily.    Dispense:  90 tablet    Refill:  1   Referral Orders  No referral(s) requested today    This visit occurred during the SARS-CoV-2 public health emergency.  Safety protocols were in place, including screening questions prior to the visit, additional usage of staff PPE, and extensive cleaning of exam room while observing appropriate contact time as indicated for disinfecting solutions.       Howard Pouch, DO 12/06/2019

## 2019-12-05 NOTE — Patient Instructions (Signed)
I will I call you with thyroid results.  Rest of your meds filled today.   Next appt in 5.5 mos   Your a1c looks great at 5.5 !

## 2019-12-06 ENCOUNTER — Telehealth: Payer: Self-pay | Admitting: Family Medicine

## 2019-12-06 ENCOUNTER — Encounter: Payer: Self-pay | Admitting: Family Medicine

## 2019-12-06 LAB — T3, FREE: T3, Free: 3.7 pg/mL (ref 2.3–4.2)

## 2019-12-06 LAB — T4, FREE: Free T4: 1.14 ng/dL (ref 0.60–1.60)

## 2019-12-06 LAB — TSH: TSH: 3.78 u[IU]/mL (ref 0.35–4.50)

## 2019-12-06 MED ORDER — LIOTHYRONINE SODIUM 5 MCG PO TABS: 2.5000 ug | ORAL_TABLET | Freq: Every day | ORAL | 3 refills | Status: DC

## 2019-12-06 MED ORDER — LEVOTHYROXINE SODIUM 150 MCG PO TABS
150.0000 ug | ORAL_TABLET | Freq: Every day | ORAL | 3 refills | Status: DC
Start: 2019-12-06 — End: 2021-02-20

## 2019-12-06 NOTE — Telephone Encounter (Signed)
Please call patient: -Her thyroid panel was excellent with a normal TSH T3 and T4 free.  I have refilled her levothyroxine at current dose of 150 mcg daily.  I also refilled her Cytomel and would encourage her to try half a tab daily, to see if that cuts back on her hot flashes. -Also, if she is still taking meloxicam she should discontinue.  This can cause her to have an increased risk of bleeding and bruising, now that she has been prescribed Eliquis blood thinner by her cardiology team.   If she continues to have hot flashes after lower dose Cytomel, we would need to consider either discontinuing Cytomel and see if hot flashes resolve versus referral to gynecology for further evaluation and recommendations since she is already on hormone replacement therapy.

## 2019-12-07 NOTE — Telephone Encounter (Signed)
Spoke with pt regarding labs and instructions. Pt declines d/c meloxicam and would like to talk to provider about alt medication.

## 2019-12-14 MED ORDER — MELOXICAM 15 MG PO TABS
15.0000 mg | ORAL_TABLET | Freq: Every day | ORAL | 1 refills | Status: DC
Start: 2019-12-14 — End: 2020-01-18

## 2019-12-14 NOTE — Telephone Encounter (Signed)
Extreme caution must be used when a patient is on any type of blood thinner and chronic antiinflammatory.  Both cause thinning of the blood and increase chances of bruising and/or bleeding.  Since it is not "contraindicated "to use them together, she can continue if that is what she is choosing, however it is important she understands the increased risks of  bleeding that can include gastrointestinal bleeding, bleeding with even minor injury or bleeding within the brain with injury.  These can lead to catastrophic event or even death, if it occurs.  If she would like to discuss further, discussion would need to occur in person (which could include virtual visit), since other forms of pain control move into the controlled substance category.  Thanks.

## 2019-12-14 NOTE — Addendum Note (Signed)
Addended by: Howard Pouch A on: 12/14/2019 02:08 PM   Modules accepted: Orders

## 2019-12-14 NOTE — Telephone Encounter (Signed)
FYI below:  Pt states she would need to medication walk. Pt sched 10/20 for VV to discuss meds.

## 2019-12-20 ENCOUNTER — Telehealth (INDEPENDENT_AMBULATORY_CARE_PROVIDER_SITE_OTHER): Payer: 59 | Admitting: Family Medicine

## 2019-12-20 ENCOUNTER — Other Ambulatory Visit: Payer: Self-pay

## 2019-12-20 ENCOUNTER — Encounter: Payer: Self-pay | Admitting: Family Medicine

## 2019-12-20 DIAGNOSIS — M48061 Spinal stenosis, lumbar region without neurogenic claudication: Secondary | ICD-10-CM | POA: Diagnosis not present

## 2019-12-20 DIAGNOSIS — M5416 Radiculopathy, lumbar region: Secondary | ICD-10-CM

## 2019-12-20 DIAGNOSIS — Z981 Arthrodesis status: Secondary | ICD-10-CM | POA: Diagnosis not present

## 2019-12-20 DIAGNOSIS — Z7901 Long term (current) use of anticoagulants: Secondary | ICD-10-CM

## 2019-12-20 DIAGNOSIS — D6869 Other thrombophilia: Secondary | ICD-10-CM | POA: Insufficient documentation

## 2019-12-20 DIAGNOSIS — M2550 Pain in unspecified joint: Secondary | ICD-10-CM | POA: Diagnosis not present

## 2019-12-20 MED ORDER — TRAMADOL HCL 50 MG PO TABS: 50.0000 mg | ORAL_TABLET | Freq: Two times a day (BID) | ORAL | 5 refills | Status: DC

## 2019-12-20 NOTE — Progress Notes (Signed)
VIRTUAL VISIT VIA VIDEO  I connected with [name] [name] on 12/20/19 at 11:00 AM EDT by elemedicine application and verified that I am speaking with the correct person using two identifiers. Location patient: Home Location provider: [facility], Office Persons participating in the virtual visit: Patient, Dr. Raoul Pitch and Samul Dada, CMA  I discussed the limitations of evaluation and management by telemedicine and the availability of in person appointments. The patient expressed understanding and agreed to proceed.   SUBJECTIVE Chief Complaint  Patient presents with  . Medication Problem    pt is wanting to discuss meloxicam usage     HPI: [name] [name] is a 54 y.o. female present to discuss chronic NSAID therapy with her h/o of a.fib and eliquis use.Patient was started on Eliquis for her atrial fibrillation condition. She had continued Mobic for her chronic arthritic/arthralgia pain. We discussed today the possibilities of increased bleeding potential with use of both chronic NSAIDs and anticoagulant. She denies gastrointestinal bleeding, bleeding from the gums or easy bruising at this time.  ROS: See pertinent positives and negatives per HPI.  Patient Active Problem List   Diagnosis Date Noted  . Chronic anticoagulation 12/20/2019  . Hypokalemia 12/06/2018  . Atrial fibrillation ([place]) 12/06/2018  . Vitamin D deficiency 12/06/2018  . Estrogen deficiency 12/06/2018  . DDD (degenerative disc disease), lumbar 09/09/2018  . Foraminal stenosis of lumbar region 09/09/2018  . History of lumbar fusion 09/09/2018  . Lumbar radiculopathy 09/09/2018  . B12 deficiency 01/31/2018  . Hormone replacement therapy (HRT) 07/20/2017  . Hair loss 07/20/2017  . Chronic insomnia 11/24/2016  . Elevated hemoglobin A1c 07/08/2016  . Hypertension 07/08/2016  . Arthralgia 07/08/2016  . Morbid obesity ([place]) 07/08/2016  . Wears glasses 07/08/2016  . Hypothyroidism   . Dyslipidemia   .  Depression   . Mild memory disturbance 11/13/2013  . Restless legs syndrome (RLS) 01/12/2013    Social History   Tobacco Use  . Smoking status: Never Smoker  . Smokeless tobacco: Never Used  Substance Use Topics  . Alcohol use: No    Alcohol/week: 0.0 standard drinks    Current Outpatient Medications:  .  apixaban (ELIQUIS) 5 MG TABS tablet, Take 1 tablet (5 mg total) by mouth 2 (two) times daily., Disp: 60 tablet, Rfl: 11 .  buPROPion (WELLBUTRIN XL) 300 MG 24 hr tablet, Take 1 tablet (300 mg total) by mouth daily., Disp: 90 tablet, Rfl: 1 .  cholecalciferol (VITAMIN D) 1000 units tablet, Take 5,000 Units by mouth daily. , Disp: , Rfl:  .  DULoxetine (CYMBALTA) 60 MG capsule, Take 1 capsule (60 mg total) by mouth daily., Disp: 90 capsule, Rfl: 1 .  estradiol (ESTRACE) 2 MG tablet, Take 1 tablet (2 mg total) by mouth daily., Disp: 90 tablet, Rfl: 3 .  Gabapentin Enacarbil ER (HORIZANT) 300 MG TBCR, TAKE 2 TABLETS BY MOUTH EVERY DAY AT 5PM WITH FOOD, Disp: 60 tablet, Rfl: 6 .  levothyroxine (SYNTHROID) 150 MCG tablet, Take 1 tablet (150 mcg total) by mouth daily., Disp: 90 tablet, Rfl: 3 .  liothyronine (CYTOMEL) 5 MCG tablet, Take 0.5-1 tablets (2.5-5 mcg total) by mouth daily., Disp: 90 tablet, Rfl: 3 .  magnesium oxide (MAG-OX) 400 MG tablet, Take 1 tablet by mouth daily., Disp: , Rfl:  .  meloxicam (MOBIC) 15 MG tablet, Take 1 tablet (15 mg total) by mouth daily., Disp: 90 tablet, Rfl: 1 .  metoprolol succinate (TOPROL-XL) 25 MG 24 hr tablet, Take 0.5 tablets (  12.5 mg total) by mouth daily., Disp: 45 tablet, Rfl: 1 .  Omega-3 Fatty Acids (FISH OIL PO), Take 1 capsule by mouth daily., Disp: , Rfl:  .  potassium chloride SA (KLOR-CON) 20 MEQ tablet, Take 1 tablet (20 mEq total) by mouth daily., Disp: 90 tablet, Rfl: 3 .  progesterone (PROMETRIUM) 100 MG capsule, Take 1 capsule (100 mg total) by mouth every other day., Disp: 45 capsule, Rfl: 3 .  rOPINIRole (REQUIP XL) 4 MG 24 hr tablet,  Take 1 tablet (4 mg total) by mouth daily., Disp: 90 tablet, Rfl: 3 .  rOPINIRole (REQUIP) 2 MG tablet, Take 1/2 tablet at lunch and 1/2 tablet at bed time., Disp: 90 tablet, Rfl: 3 .  telmisartan-hydrochlorothiazide (MICARDIS HCT) 80-25 MG tablet, Take 1 tablet by mouth daily., Disp: 90 tablet, Rfl: 1 .  traMADol (ULTRAM) 50 MG tablet, Take 1 tablet (50 mg total) by mouth 2 (two) times daily., Disp: 60 tablet, Rfl: 5  Allergies  Allergen Reactions  . Bee Venom Swelling and Anaphylaxis  . Biaxin [Clarithromycin] Diarrhea and Rash  . Gabapentin Nausea And Vomiting    No reaction to Horizant  . Abilify [Aripiprazole] Other (See Comments)    MOOD SWING  . Buspar [Buspirone] Diarrhea  . Lisinopril Cough  . Maxzide [Triamterene-Hctz] Rash  . Phentermine Other (See Comments)    MOOD CHANGE   . Seroquel [Quetiapine Fumarate] Other (See Comments)    MOOD SWINGS    OBJECTIVE: There were no vitals taken for this visit. Gen: No acute distress. Nontoxic in appearance.  HENT: AT. [place].  MMM.  Eyes:Pupils Equal Round Reactive to light, Extraocular movements intact,  Conjunctiva without redness, discharge or icterus. Neuro: Alert. Oriented x3  Psych: Normal affect and demeanor. Normal speech. Normal thought content and judgment.  ASSESSMENT AND PLAN: [name] [name] is a 54 y.o. female present for  Arthralgia, unspecified joint/Foraminal stenosis of lumbar region History of lumbar fusion/ Lumbar radiculopathy/Chronic anticoagulation Discussion today surrounding bleeding risk with use of both chronic NSAID and chronic anticoagulant with patient today. She is now aware of the bleeding risk and desires not to continue the chronic NSAID. Options for pain control in order to allow her to continue daily activities and maintain quality of life were discussed today. [place] controlled substance database was reviewed today and appropriate. UDS and contract will be signed at next in office  visit. After discussion, it was agreed upon starting tramadol 50 mg twice daily as needed. Tramadol 50 mg #60, with five refills prescribed today. Patient is aware of controlled substance policy. Patient is aware face-to-face visit will be needed every 6 months in order to continue.   Howard Pouch, DO 12/20/2019   Meds ordered this encounter  Medications  . traMADol (ULTRAM) 50 MG tablet    Sig: Take 1 tablet (50 mg total) by mouth 2 (two) times daily.    Dispense:  60 tablet    Refill:  5    Chronic med.   Referral Orders  No referral(s) requested today

## 2019-12-26 ENCOUNTER — Ambulatory Visit
Admission: RE | Admit: 2019-12-26 | Discharge: 2019-12-26 | Disposition: A | Discharge status: Home / Self Care | Payer: 59 | Source: Ambulatory Visit | Attending: Family Medicine | Admitting: Family Medicine

## 2019-12-26 ENCOUNTER — Other Ambulatory Visit: Payer: Self-pay

## 2019-12-26 DIAGNOSIS — Z1231 Encounter for screening mammogram for malignant neoplasm of breast: Secondary | ICD-10-CM

## 2020-01-05 ENCOUNTER — Other Ambulatory Visit: Payer: Self-pay | Admitting: Cardiovascular Disease

## 2020-01-05 NOTE — Telephone Encounter (Signed)
Prescription refill request for Eliquis received.  Last office visit: Nahser, 03/17/2019 Scr: 0.61, 01/17/2019 Age: 54 yo Weight: 112 kg   Prescription refill sent.

## 2020-01-09 ENCOUNTER — Other Ambulatory Visit: Payer: Self-pay

## 2020-01-16 ENCOUNTER — Encounter: Payer: Self-pay | Admitting: Family Medicine

## 2020-01-16 MED ORDER — MAGNESIUM OXIDE 400 MG PO TABS
1.0000 | ORAL_TABLET | Freq: Every day | ORAL | 3 refills | Status: DC
Start: 2020-01-16 — End: 2020-12-27

## 2020-01-16 MED ORDER — PROGESTERONE MICRONIZED 100 MG PO CAPS: 100.0000 mg | ORAL_CAPSULE | Freq: Every day | ORAL | 3 refills | Status: DC

## 2020-01-16 NOTE — Telephone Encounter (Signed)
[facility]pic is not allowing me to refill medication. Please advise

## 2020-01-16 NOTE — Telephone Encounter (Signed)
Tomorrow would be better if there is an opening.

## 2020-01-16 NOTE — Telephone Encounter (Signed)
Refilled both for her

## 2020-01-16 NOTE — Telephone Encounter (Signed)
LVM for pt to CB to sched an appt for tomorrow at [facility]

## 2020-01-16 NOTE — Telephone Encounter (Signed)
Patient scheduled appt for 11/18 with Dr. Raoul Pitch  Patient checking on refill request for progesterone (PROMETRIUM) 100 MG capsule [[id number]]   [facility] [place]

## 2020-01-18 ENCOUNTER — Encounter: Payer: Self-pay | Admitting: Family Medicine

## 2020-01-18 ENCOUNTER — Other Ambulatory Visit: Payer: Self-pay

## 2020-01-18 ENCOUNTER — Ambulatory Visit (INDEPENDENT_AMBULATORY_CARE_PROVIDER_SITE_OTHER): Payer: 59 | Admitting: Family Medicine

## 2020-01-18 VITALS — BP 136/80 | HR 72 | Temp 98.4°F | Ht 63.0 in | Wt 240.0 lb

## 2020-01-18 DIAGNOSIS — M25562 Pain in left knee: Secondary | ICD-10-CM

## 2020-01-18 DIAGNOSIS — M2550 Pain in unspecified joint: Secondary | ICD-10-CM

## 2020-01-18 DIAGNOSIS — G8929 Other chronic pain: Secondary | ICD-10-CM

## 2020-01-18 MED ORDER — MELOXICAM 7.5 MG PO TABS: 7.5000 mg | ORAL_TABLET | Freq: Every day | ORAL | 1 refills | Status: DC

## 2020-01-18 NOTE — Patient Instructions (Signed)
Start the mobic back at 7.5 mg- be very cautious and monitor for any bleeding. Stop immediatly if you experience.   Also start 2 extra strength tylenol nightly.   We will call you with lab results and discuss other options if there are any.   Hope you have a wonderful Thanksgiving.

## 2020-01-18 NOTE — Progress Notes (Signed)
Patient Care Team    Relationship Specialty Notifications Start End  [name], DO PCP - General Family Medicine  07/07/16   Nahser, Wonda Cheng, MD PCP - Cardiology Cardiology Admissions 12/15/18   Ob/Gyn, [name]    07/08/16   Wilford Corner, MD Consulting Physician Gastroenterology  07/08/16   [name], MD Consulting Physician Neurology  07/08/16   [name], MD Attending Physician Endocrinology  07/08/16    Comment: Pt established with Si Raider, NP at this location. - Integrative med- thyroid  [name], MD Consulting Physician Rehabilitation  03/06/19    Comment: Neurosurgeon-spine and scoliosis specialist     SUBJECTIVE Chief Complaint  Patient presents with  . Follow-up    chronic pain     HPI: [name] [name] is a 54 y.o. female present to discuss chronic NSAID therapy with her h/o of a.fib and eliquis use.Patient was started on Eliquis for her atrial fibrillation condition. She had continued Mobic for her chronic arthritic/arthralgia pain until 12/20/2019.  After We discussedthe possibilities of increased bleeding potential with use of both chronic NSAIDs and anticoagulant she discontinued and a trial of tramadol was completed.  Patient reports today the tramadol was not helpful at all.  She is uncertain what to do now because she also does not want to take a chronic narcotic medication.  She reports her mother has just been diagnosed with rheumatoid arthritis.  Patient has never had a full arthritis work-up herself.  ROS: See pertinent positives and negatives per HPI.  Patient Active Problem List   Diagnosis Date Noted  . Chronic anticoagulation 12/20/2019  . Hypokalemia 12/06/2018  . Atrial fibrillation ([place]) 12/06/2018  . Vitamin D deficiency 12/06/2018  . Estrogen deficiency 12/06/2018  . DDD (degenerative disc disease), lumbar 09/09/2018  . Foraminal stenosis of lumbar region 09/09/2018  . History of lumbar fusion 09/09/2018  . Lumbar  radiculopathy 09/09/2018  . B12 deficiency 01/31/2018  . Hormone replacement therapy (HRT) 07/20/2017  . Hair loss 07/20/2017  . Chronic insomnia 11/24/2016  . Elevated hemoglobin A1c 07/08/2016  . Hypertension 07/08/2016  . Arthralgia 07/08/2016  . Morbid obesity ([place]) 07/08/2016  . Wears glasses 07/08/2016  . Hypothyroidism   . Dyslipidemia   . Depression   . Mild memory disturbance 11/13/2013  . Restless legs syndrome (RLS) 01/12/2013    Social History   Tobacco Use  . Smoking status: Never Smoker  . Smokeless tobacco: Never Used  Substance Use Topics  . Alcohol use: No    Alcohol/week: 0.0 standard drinks    Current Outpatient Medications:  .  buPROPion (WELLBUTRIN XL) 300 MG 24 hr tablet, Take 1 tablet (300 mg total) by mouth daily., Disp: 90 tablet, Rfl: 1 .  cholecalciferol (VITAMIN D) 1000 units tablet, Take 5,000 Units by mouth daily. , Disp: , Rfl:  .  DULoxetine (CYMBALTA) 60 MG capsule, Take 1 capsule (60 mg total) by mouth daily., Disp: 90 capsule, Rfl: 1 .  ELIQUIS 5 MG TABS tablet, TAKE 1 TABLET TWICE DAILY, Disp: 60 tablet, Rfl: 5 .  estradiol (ESTRACE) 2 MG tablet, Take 1 tablet (2 mg total) by mouth daily., Disp: 90 tablet, Rfl: 3 .  Gabapentin Enacarbil ER (HORIZANT) 300 MG TBCR, TAKE 2 TABLETS BY MOUTH EVERY DAY AT 5PM WITH FOOD, Disp: 60 tablet, Rfl: 6 .  levothyroxine (SYNTHROID) 150 MCG tablet, Take 1 tablet (150 mcg total) by mouth daily., Disp: 90 tablet, Rfl: 3 .  liothyronine (CYTOMEL) 5 MCG  tablet, Take 0.5-1 tablets (2.5-5 mcg total) by mouth daily., Disp: 90 tablet, Rfl: 3 .  magnesium oxide (MAG-OX) 400 MG tablet, Take 1 tablet (400 mg total) by mouth daily., Disp: 90 tablet, Rfl: 3 .  metoprolol succinate (TOPROL-XL) 25 MG 24 hr tablet, Take 0.5 tablets (12.5 mg total) by mouth daily., Disp: 45 tablet, Rfl: 1 .  Omega-3 Fatty Acids (FISH OIL PO), Take 1 capsule by mouth daily., Disp: , Rfl:  .  potassium chloride SA (KLOR-CON) 20 MEQ tablet, Take  1 tablet (20 mEq total) by mouth daily., Disp: 90 tablet, Rfl: 3 .  progesterone (PROMETRIUM) 100 MG capsule, Take 1 capsule (100 mg total) by mouth daily., Disp: 45 capsule, Rfl: 3 .  rOPINIRole (REQUIP XL) 4 MG 24 hr tablet, Take 1 tablet (4 mg total) by mouth daily., Disp: 90 tablet, Rfl: 3 .  rOPINIRole (REQUIP) 2 MG tablet, Take 1/2 tablet at lunch and 1/2 tablet at bed time., Disp: 90 tablet, Rfl: 3 .  telmisartan-hydrochlorothiazide (MICARDIS HCT) 80-25 MG tablet, Take 1 tablet by mouth daily., Disp: 90 tablet, Rfl: 1 .  meloxicam (MOBIC) 7.5 MG tablet, Take 1 tablet (7.5 mg total) by mouth daily., Disp: 90 tablet, Rfl: 1  Allergies  Allergen Reactions  . Bee Venom Swelling and Anaphylaxis  . [place] Venom Swelling  . Biaxin [Clarithromycin] Diarrhea and Rash  . Gabapentin Nausea And Vomiting    No reaction to Horizant  . Abilify [Aripiprazole] Other (See Comments)    MOOD SWING  . Buspar [Buspirone] Diarrhea  . Lisinopril Cough  . Maxzide [Triamterene-Hctz] Rash  . Phentermine Other (See Comments)    MOOD CHANGE   . Seroquel [Quetiapine Fumarate] Other (See Comments)    MOOD SWINGS    OBJECTIVE: BP 136/80   Pulse 72   Temp 98.4 F (36.9 C) (Oral)   Ht '5\' 3"'  (1.6 m)   Wt 240 lb (108.9 kg)   SpO2 100%   BMI 42.51 kg/m  Gen: Afebrile. No acute distress.  Nontoxic in presentation, well-developed, well-nourished, pleasant female HENT: AT. [place].  Eyes:Pupils Equal Round Reactive to light, Extraocular movements intact,  Conjunctiva without redness, discharge or icterus. MSK: Bilateral hand PIP joint deformities on multiple fingers. Skin: no rashes, purpura or petechiae.  Neuro:  Normal gait. PERLA. EOMi. Alert. Oriented x3 Psych: Normal affect, dress and demeanor. Normal speech. Normal thought content and judgment.   ASSESSMENT AND PLAN: [name] [name] is a 54 y.o. female present for  Arthralgia, unspecified joint/Foraminal stenosis of lumbar region History of  lumbar fusion/ Lumbar radiculopathy/Chronic anticoagulation/polyarthralgia She is aware of the bleeding risk and would like to try lower dose Mobic 7.5 mg to see if it is helpful with controlling her pain and she will monitor for any signs of bleeding. Start 2 extra strength Tylenol nightly as well. Tried: Tramadol was not helpful She does not want to start a narcotic chronic prescription, unless absolutely last resort. Referred back to sports medicine for her knee only, per patient's request he would like to go to the sports medicine in [place]. Start work-up for polyarthralgia with ANA, HLA-B27, rheumatoid factor and CCP.   Howard Pouch, DO 01/18/2020  Orders Placed This Encounter  Procedures  . Cyclic citrul peptide antibody, IgG ([facility])  . Rheumatoid Factor  . ANA, IFA Comprehensive Panel-([facility])  . HLA-B27 antigen  . Ambulatory referral to Sports Medicine    Meds ordered this encounter  Medications  . meloxicam (MOBIC) 7.5 MG tablet  Sig: Take 1 tablet (7.5 mg total) by mouth daily.    Dispense:  90 tablet    Refill:  1    Hold until pt request. Please dc other doses. Thanks.    Referral Orders     Ambulatory referral to Sports Medicine

## 2020-01-19 ENCOUNTER — Encounter: Payer: Self-pay | Admitting: Family Medicine

## 2020-01-19 ENCOUNTER — Encounter: Payer: Self-pay | Admitting: Internal Medicine

## 2020-01-19 ENCOUNTER — Telehealth (INDEPENDENT_AMBULATORY_CARE_PROVIDER_SITE_OTHER): Payer: 59 | Admitting: Internal Medicine

## 2020-01-19 VITALS — BP 149/125 | HR 67

## 2020-01-19 DIAGNOSIS — Z8709 Personal history of other diseases of the respiratory system: Secondary | ICD-10-CM

## 2020-01-19 DIAGNOSIS — I10 Essential (primary) hypertension: Secondary | ICD-10-CM | POA: Diagnosis not present

## 2020-01-19 DIAGNOSIS — J22 Unspecified acute lower respiratory infection: Secondary | ICD-10-CM

## 2020-01-19 MED ORDER — ALBUTEROL SULFATE HFA 108 (90 BASE) MCG/ACT IN AERS: 2.0000 | INHALATION_SPRAY | Freq: Four times a day (QID) | RESPIRATORY_TRACT | 1 refills | Status: DC | PRN

## 2020-01-19 NOTE — Progress Notes (Signed)
I, Peterson Lombard, LAT, ATC acting as a scribe for Lynne Leader, MD.  Subjective:    I'm seeing this patient as a consultation for Dr. Howard Pouch. Note will be routed back to referring provider/PCP.  CC: L knee pain  HPI: Pt is a 54 yo female c/o chronic L knee pn. Pt locates pn to lateral joint line. On going issue for 5-6 years. Pt suffered a Fx last year (nondisplaced trabecular fracture involving the anterior medial aspect of the medial tibial plateau as well as a complex tear of the posterior horn and body of the medial meniscus). L knee has started hurting too because of compensating. Of note, pt's mother has been recently diagnosed w/ RA.  Aggravates: walking Rx tried: 2 Tylenol nightly, Tramadol w/ no relief  Diagnostic testing: L knee MRI- 11/30/18; L knee XR- 11/08/18  Past medical history, Surgical history, Family history, Social history, Allergies, and medications have been entered into the medical record, reviewed.   Review of Systems: No new headache, visual changes, nausea, vomiting, diarrhea, constipation, dizziness, abdominal pain, skin rash, fevers, chills, night sweats, weight loss, swollen lymph nodes, body aches, joint swelling, muscle aches, chest pain, shortness of breath, mood changes, visual or auditory hallucinations.   Objective:    Vitals:   01/22/20 1400  BP: (!) 148/96  Pulse: 95  SpO2: 99%   General: Well Developed, well nourished, and in no acute distress.  Neuro/Psych: Alert and oriented x3, extra-ocular muscles intact, able to move all 4 extremities, sensation grossly intact. Skin: Warm and dry, no rashes noted.  Respiratory: Not using accessory muscles, speaking in full sentences, trachea midline.  Cardiovascular: Pulses palpable, no extremity edema. Abdomen: Does not appear distended. MSK: Left knee mild swelling otherwise normal-appearing Normal motion with crepitation. Tender palpation anterior knee just medial to patellar tendon at joint  line. Stable ligamentous exam. Intact strength.  Lab and Radiology Results  X-ray images left knee obtained today personally and independently interpreted Moderate to significant medial compartment and patellofemoral DJD. Await formal radiology review  Diagnostic Limited MSK Ultrasound of: Left knee Quad tendon intact normal-appearing Small joint effusion superior patellar space. Patellar tendon intact normal-appearing Just medial to patella tendon at area of tenderness osteophyte visible at proximal tibia and tibial plateau impinging or near impinging on superficial structures including fat pad. Medial and lateral joint line narrowed degenerative appearing meniscus. Posterior knee no Baker's cyst. Impression: DJD with possible Hoffa fat pad impingement  EXAM: MRI OF THE LEFT KNEE WITHOUT CONTRAST  TECHNIQUE: Multiplanar, multisequence MR imaging of the knee was performed. No intravenous contrast was administered.  COMPARISON:  X-ray 11/08/2018  FINDINGS: MENISCI  Medial meniscus: Complete radial tear of the posterior horn near the posterior root attachment (series 5, image 9). Additional complex tear component propagates to the medial meniscal body-posterior horn junction with horizontal component (series 8, image 21). Meniscus is partially extruded medially.  Lateral meniscus:  Intact.  LIGAMENTS  Cruciates:  Intact ACL and PCL.  Collaterals: Intact MCL with periligamentous edema superficial and deep to the ligament, which may be reactive. The lateral collateral ligament complex is intact.  CARTILAGE  Patellofemoral: High-grade cartilage loss of the lateral trochlea with milder surface irregularity of the inferior aspect of the lateral patellar facet.  Medial:  Mild cartilage thinning of the weight-bearing surfaces.  Lateral: Near full-thickness cartilage fissure involving the central aspect of the lateral tibial plateau (series 5, image  11)  Joint:  Small joint effusion.  Minimal edema within  Hoffa's fat.  Popliteal Fossa:  Intact popliteus tendon.  Trace Baker's cyst.  Extensor Mechanism:  Intact quadriceps tendon and patellar tendon.  Bones: Prominent marrow edema within the anteromedial aspect of the medial tibial plateau where there is a subtle irregular linear low signal intensity component suggesting a nondisplaced trabecular fracture (series 3, image 20). Small subchondral cystic changes at the medial tibial plateau. Mildly prominent red marrow.  Other: Subcutaneous edema most pronounced over the anterior and anteromedial aspect of the proximal tibia.  IMPRESSION: 1. Findings compatible with an acute nondisplaced trabecular fracture involving the anteromedial aspect of the medial tibial plateau. 2. Complex tear of the posterior horn and body of the medial meniscus with complete radial transsection near the posterior root attachment. 3. Low-grade MCL sprain versus reactive periligamentous edema from adjacent medial compartment pathology. 4. Tricompartmental cartilage irregularities, as above.  These results will be called to the ordering clinician or representative by the Radiologist Assistant, and communication documented in the PACS or zVision Dashboard.   Electronically Signed   By: Davina Poke M.D.   On: 12/01/2018 09:41 I, Lynne Leader, personally (independently) visualized and performed the interpretation of the images attached in this note.  Impression and Recommendations:    Assessment and Plan: 54 y.o. female with left knee pain with history of meniscus tear and trabecular fracture seen last year on MRI September 2020.  Ongoing chronic pain.  Discussed case with patient.  Discussed that ultimately she will require total knee replacement.  We will repeat x-ray to further evaluate causes of pain.  Additionally will authorize hyaluronic acid injections.  She is not had this treatment  trial yet does not want to proceed with repeat steroid injections if they have not been helpful in the past.  If this does not help would consider targeted injection at possible impingement site at Hoffa's fat pad just medial to patellar tendon.  PDMP not reviewed this encounter. Orders Placed This Encounter  Procedures  . DG Knee AP/LAT W/[place] Left    Standing Status:   Future    Number of Occurrences:   1    Standing Expiration Date:   01/21/2021    Order Specific Question:   Reason for Exam (SYMPTOM  OR DIAGNOSIS REQUIRED)    Answer:   Chronic left knee pain    Order Specific Question:   Preferred imaging location?    Answer:   [facility]    Order Specific Question:   Is patient pregnant?    Answer:   No  . [place] LIMITED JOINT SPACE STRUCTURES LOW LEFT(NO LINKED CHARGES)    Order Specific Question:   Reason for Exam (SYMPTOM  OR DIAGNOSIS REQUIRED)    Answer:   lt knee    Order Specific Question:   Preferred imaging location?    Answer:   [facility]   No orders of the defined types were placed in this encounter.   Discussed warning signs or symptoms. Please see discharge instructions. Patient expresses understanding.   The above documentation has been reviewed and is accurate and complete Lynne Leader, M.D.

## 2020-01-19 NOTE — Progress Notes (Signed)
Virtual Visit via Video Note  I connected with@ on 01/19/20 at  1:30 PM EST by a video enabled telemedicine application and verified that I am speaking with the correct person using two identifiers. Location patient: home Location provider:work office Persons participating in the virtual visit: patient, provider  WIth national recommendations  regarding COVID 19 pandemic   video visit is advised over in office visit for this patient.  Patient aware  of the limitations of evaluation and management by telemedicine and  availability of in person appointments. and agreed to proceed.   HPI: [name] [name] presents for SDA video visit PCP appointment not available Had just seen her primary physician yesterday for another problem. Last night had the acute onset of upper respiratory congestion nasal discharge yellow and coughing phlegm without associated fever chills initial shortness of breath. She states this is reminisces of "bronchitis" that she gets 2-3 times a year.  No diagnosis of allergy or underlying lung disease per se She is using no spray and Tylenol but no decongestants. She has used inhalers in past with some help  She has recently had a flu vaccine but is declined Covid vaccination not feeling comfortable with this. No one is sick in her household and she has no known exposures.  She states she is going to do a home Covid test. Blood pressures been controlled has not missed medicines uncertain why her blood pressure is high.   ROS: See pertinent positives and negatives per HPI. No current cp sob fever   Past Medical History:  Diagnosis Date  . Anxiety 01/12/2013  . Arthralgia 07/08/2016  . Asthma   . Atrial fibrillation ([place]) 12/06/2018  . B12 deficiency 01/31/2018  . Chicken pox   . Chronic insomnia 11/24/2016  . Clostridium difficile infection 04/02/2014, 02/06/2014  . Complex tear of medial meniscus of left knee as current injury 12/01/2018  . Complex tear of medial  meniscus of left knee as current injury 12/01/2018  . DDD (degenerative disc disease), lumbar 09/09/2018  . Depression   . Diabetes mellitus without complication ([employer]) 16/03/958  . Dyslipidemia   . Elevated hemoglobin A1c 07/08/2016  . Estrogen deficiency 12/06/2018  . Foraminal stenosis of lumbar region 09/09/2018  . Hair loss 07/20/2017  . History of lumbar fusion 09/09/2018  . Hormone replacement therapy (HRT) 07/20/2017  . HTN (hypertension)   . Hypertension 07/08/2016  . Hypokalemia 12/06/2018  . Hypothyroidism   . Left medial tibial plateau fracture 11/10/2018  . Lumbar radiculopathy 09/09/2018   Referred to NS 09/09/2018  . Migraine   . Mild memory disturbance 11/13/2013  . Morbid obesity ([place]) 07/08/2016  . Multiple allergies   . Obesity   . Restless legs syndrome (RLS) 01/12/2013   Follows with Dr. Jannifer Franklin who prescribes Requip and gabapentin  . RLS (restless legs syndrome)   . Vitamin D deficiency 12/06/2018  . Wears glasses 07/08/2016    Past Surgical History:  Procedure Laterality Date  . BREAST BIOPSY  4540,9811   x 2 benign lesion  . CERVICAL DISCECTOMY  2003  . CERVICAL FUSION  2010   x2  . Hand fracture surgery    . LUMBAR FUSION  2016  . VAGINAL HYSTERECTOMY  2009    Family History  Problem Relation Age of Onset  . Hypertension Father   . Diabetes Father   . Heart disease Father   . Hearing loss Father   . Diabetes Brother   . Liver cancer Mother   . COPD  Mother   . Mental illness Mother   . Diabetes Mother   . Hearing loss Mother   . Heart disease Mother   . Arthritis/Rheumatoid Mother     Social History   Tobacco Use  . Smoking status: Never Smoker  . Smokeless tobacco: Never Used  Vaping Use  . Vaping Use: Never used  Substance Use Topics  . Alcohol use: No    Alcohol/week: 0.0 standard drinks  . Drug use: No      Current Outpatient Medications:  .  buPROPion (WELLBUTRIN XL) 300 MG 24 hr tablet, Take 1 tablet (300 mg total) by mouth daily.,  Disp: 90 tablet, Rfl: 1 .  cholecalciferol (VITAMIN D) 1000 units tablet, Take 5,000 Units by mouth daily. , Disp: , Rfl:  .  DULoxetine (CYMBALTA) 60 MG capsule, Take 1 capsule (60 mg total) by mouth daily., Disp: 90 capsule, Rfl: 1 .  ELIQUIS 5 MG TABS tablet, TAKE 1 TABLET TWICE DAILY, Disp: 60 tablet, Rfl: 5 .  estradiol (ESTRACE) 2 MG tablet, Take 1 tablet (2 mg total) by mouth daily., Disp: 90 tablet, Rfl: 3 .  Gabapentin Enacarbil ER (HORIZANT) 300 MG TBCR, TAKE 2 TABLETS BY MOUTH EVERY DAY AT 5PM WITH FOOD, Disp: 60 tablet, Rfl: 6 .  levothyroxine (SYNTHROID) 150 MCG tablet, Take 1 tablet (150 mcg total) by mouth daily., Disp: 90 tablet, Rfl: 3 .  liothyronine (CYTOMEL) 5 MCG tablet, Take 0.5-1 tablets (2.5-5 mcg total) by mouth daily., Disp: 90 tablet, Rfl: 3 .  magnesium oxide (MAG-OX) 400 MG tablet, Take 1 tablet (400 mg total) by mouth daily., Disp: 90 tablet, Rfl: 3 .  meloxicam (MOBIC) 7.5 MG tablet, Take 1 tablet (7.5 mg total) by mouth daily., Disp: 90 tablet, Rfl: 1 .  metoprolol succinate (TOPROL-XL) 25 MG 24 hr tablet, Take 0.5 tablets (12.5 mg total) by mouth daily., Disp: 45 tablet, Rfl: 1 .  Omega-3 Fatty Acids (FISH OIL PO), Take 1 capsule by mouth daily., Disp: , Rfl:  .  potassium chloride SA (KLOR-CON) 20 MEQ tablet, Take 1 tablet (20 mEq total) by mouth daily., Disp: 90 tablet, Rfl: 3 .  progesterone (PROMETRIUM) 100 MG capsule, Take 1 capsule (100 mg total) by mouth daily., Disp: 45 capsule, Rfl: 3 .  rOPINIRole (REQUIP XL) 4 MG 24 hr tablet, Take 1 tablet (4 mg total) by mouth daily., Disp: 90 tablet, Rfl: 3 .  rOPINIRole (REQUIP) 2 MG tablet, Take 1/2 tablet at lunch and 1/2 tablet at bed time., Disp: 90 tablet, Rfl: 3 .  telmisartan-hydrochlorothiazide (MICARDIS HCT) 80-25 MG tablet, Take 1 tablet by mouth daily., Disp: 90 tablet, Rfl: 1 .  albuterol (VENTOLIN HFA) 108 (90 Base) MCG/ACT inhaler, Inhale 2 puffs into the lungs every 6 (six) hours as needed for wheezing  or shortness of breath (bronchitis)., Disp: 1 each, Rfl: 1  EXAM: BP Readings from Last 3 Encounters:  01/19/20 (!) 149/125  01/18/20 136/80  12/05/19 123/62    VITALS per patient if applicable:  GENERAL: alert, oriented, appears well and in no acute distress has some mild congestion obvious nontoxic no respiratory distress no active coughing during the visit. Mildly hoarse no stridor HEENT: atraumatic, conjunttiva clear, no obvious abnormalities on inspection of external nose and ears NECK: normal movements of the head and neck  LUNGS: on inspection no signs of respiratory distress, breathing rate appears normal, no obvious gross SOB, gasping or wheezing CV: no obvious cyanosis PSYCH/NEURO: pleasant and cooperative, no obvious depression or  anxiety, speech and thought processing grossly intact Lab Results  Component Value Date   WBC 10.7 01/18/2019   HGB 13.9 01/18/2019   HCT 40.2 01/18/2019   PLT 334 01/18/2019   GLUCOSE 100 (H) 01/17/2019   CHOL 150 08/02/2018   TRIG 183.0 (H) 08/02/2018   HDL 48.60 08/02/2018   LDLCALC 65 08/02/2018   ALT 21 12/06/2018   AST 11 12/06/2018   NA 139 01/17/2019   K 4.0 01/17/2019   CL 99 01/17/2019   CREATININE 0.61 01/17/2019   BUN 17 01/17/2019   CO2 22 01/17/2019   TSH 3.78 12/05/2019   INR 1.02 11/11/2010   HGBA1C 5.5 12/05/2019   HGBA1C 5.5 12/05/2019   HGBA1C 5.5 (A) 12/05/2019   HGBA1C 5.5 12/05/2019    ASSESSMENT AND PLAN:  Discussed the following assessment and plan:    ICD-10-CM   1. Acute respiratory infection  J22   2. History of acute bronchitis  Z87.09    poss asthmatic ? by hx    3. Primary hypertension  I10    elevated readings today    Counseled.  Uncertain cause of the elevated blood pressure reading as she states that she is on medicine and it has been controlled perhaps a one-time problem. Get fu readings  To confirm if ocntinuing and fu pcp  Agree with getting Covid tested she is going to do the  antigen today but advise she may get the more sensitive PCR test because she has high risk of this is Covid and she is unvaccinated.  Still high transmission in the community.  She is open to receiving monoclonal antibody if her tests are positive.  And she is aware.  We could do a referral if her test is positive she lets [place] know She gets bronchitis 2-3 times a year usually ends up on steroids at this point less than 24 hours would treat symptomatically add albuterol close follow-up advised. Advised alarm symptoms of when to seek emergent care.  Expectant management and discussion of plan and treatment with opportunity to ask questions and all were answered. The patient agreed with the plan and demonstrated an understanding of the instructions.   Advised to call back or seek an in-person evaluation if worsening  or having  further concerns . Return for update pcp  after weekend  or a sneede . 31 minutes review plan and  Fu    Shanon Ace, MD

## 2020-01-19 NOTE — Telephone Encounter (Signed)
Spoke with pt and scheduled VV with Panosh at 1:30

## 2020-01-22 ENCOUNTER — Other Ambulatory Visit: Payer: Self-pay

## 2020-01-22 ENCOUNTER — Ambulatory Visit: Payer: Self-pay

## 2020-01-22 ENCOUNTER — Ambulatory Visit (INDEPENDENT_AMBULATORY_CARE_PROVIDER_SITE_OTHER): Payer: 59 | Admitting: Family Medicine

## 2020-01-22 ENCOUNTER — Encounter: Payer: Self-pay | Admitting: Family Medicine

## 2020-01-22 ENCOUNTER — Ambulatory Visit (INDEPENDENT_AMBULATORY_CARE_PROVIDER_SITE_OTHER): Payer: 59

## 2020-01-22 VITALS — BP 148/96 | HR 95 | Ht 63.0 in | Wt 252.6 lb

## 2020-01-22 DIAGNOSIS — M25562 Pain in left knee: Secondary | ICD-10-CM

## 2020-01-22 DIAGNOSIS — G8929 Other chronic pain: Secondary | ICD-10-CM | POA: Diagnosis not present

## 2020-01-22 NOTE — Patient Instructions (Signed)
Thank you for coming in today.  I will work on the gel shot authorization.   Please use voltaren gel up to 4x daily for pain as needed.   Please get an Xray today before you leave   I recommend you obtained a compression sleeve to help with your joint problems. There are many options on the market however I recommend obtaining a full knee Body Helix compression sleeve.  You can find information (including how to appropriate measure yourself for sizing) can be found at www.Body http://www.lambert.com/.  Many of these products are health savings account (HSA) eligible.   You can use the compression sleeve at any time throughout the day but is most important to use while being active as well as for 2 hours post-activity.   It is appropriate to ice following activity with the compression sleeve in place.   We will let you know when gel shots are approved and we can proceed to next steps.    If we cannot get the gel shots approved we can try the target cortisone shot at the front of the knee where the bone is prominent.

## 2020-01-23 ENCOUNTER — Encounter: Payer: Self-pay | Admitting: Family Medicine

## 2020-01-23 LAB — ANA, IFA COMPREHENSIVE PANEL
Anti Nuclear Antibody (ANA): POSITIVE — AB
ENA SM Ab Ser-aCnc: <1 AI
SM/RNP: <1 AI
SSA (Ro) (ENA) Antibody, IgG: <1 AI
SSB (La) (ENA) Antibody, IgG: <1 AI
Scleroderma (Scl-70) (ENA) Antibody, IgG: <1 AI
ds DNA Ab: <1 IU/mL

## 2020-01-23 LAB — HLA-B27 ANTIGEN: HLA-B27 Antigen: NEGATIVE

## 2020-01-23 LAB — CYCLIC CITRUL PEPTIDE ANTIBODY, IGG: Cyclic Citrullin Peptide Ab: <16 UNITS

## 2020-01-23 LAB — ANTI-NUCLEAR AB-TITER (ANA TITER): ANA Titer 1: 1:40 {titer} — ABNORMAL HIGH

## 2020-01-23 LAB — RHEUMATOID FACTOR: Rheumatoid fact SerPl-aCnc: <14 IU/mL (ref <14)

## 2020-01-23 NOTE — Progress Notes (Signed)
X-ray left knee shows some arthritis.  No fracture.

## 2020-01-23 NOTE — Telephone Encounter (Signed)
Pt verbally refused VV with [facility] provider. Pt states that she felt the VV she had on Friday was pointless.

## 2020-02-14 ENCOUNTER — Other Ambulatory Visit: Payer: Self-pay | Admitting: Cardiovascular Disease

## 2020-03-07 ENCOUNTER — Telehealth: Payer: Self-pay

## 2020-03-07 NOTE — Telephone Encounter (Signed)
Spoke with pt and informed her of the following message. Pt states she has made arrangements to be seen by another provider         .

## 2020-03-07 NOTE — Telephone Encounter (Signed)
Patient called to request in office appt with Dr. Claiborne Billings.  She states that she had the flu last week and is not getting any better.  She reports she is wheezing and coughing and left ear pain. After asking patient about being tested for covid and flu, she stated that she was not tested for the flu, she was exposed to her grandaughter who tested positive for the flu. Patient refused to schedule virtual appt.  She states that she has been sick since her last virtual appt on 01/19/20, and said the provider was "useless".  Virtual visit was with Dr. Fabian Sharp.  Please call [phone]

## 2020-03-07 NOTE — Telephone Encounter (Signed)
Per policy with those symptoms and an acute illness, she will need to be seen virtually without a negative Covid test.  Please reassure her that we will do testing with drive up testing at our site even though she is virtual. Unfortunately I do not have any openings today.

## 2020-03-07 NOTE — Telephone Encounter (Signed)
Please advise message below  °

## 2020-03-16 ENCOUNTER — Other Ambulatory Visit: Payer: Self-pay | Admitting: Internal Medicine

## 2020-03-18 NOTE — Telephone Encounter (Signed)
Last video visit with you ---01/19/2020 Last refill--01/19/2020

## 2020-03-19 NOTE — Telephone Encounter (Signed)
Please directed refills to her PCP

## 2020-03-26 ENCOUNTER — Ambulatory Visit: Payer: 59 | Admitting: Cardiovascular Disease

## 2020-04-11 ENCOUNTER — Other Ambulatory Visit: Payer: Self-pay | Admitting: Emergency Medicine

## 2020-04-11 MED ORDER — HORIZANT 300 MG PO TBCR: EXTENDED_RELEASE_TABLET | ORAL | 6 refills | Status: DC

## 2020-04-16 ENCOUNTER — Telehealth: Payer: Self-pay | Admitting: Emergency Medicine

## 2020-04-16 NOTE — Telephone Encounter (Signed)
Received fax from [facility] stating patient is requesting PA on Horizant ER 300 mg.  Ptient's insurance has not changed since 2021 and no PA required last year.  Called [facility] and was told that PA is not required for Horizant.

## 2020-05-08 ENCOUNTER — Other Ambulatory Visit: Payer: Self-pay | Admitting: Cardiovascular Disease

## 2020-05-08 MED ORDER — POTASSIUM CHLORIDE CRYS ER 20 MEQ PO TBCR: 20.0000 meq | EXTENDED_RELEASE_TABLET | Freq: Every day | ORAL | 0 refills | Status: DC

## 2020-05-08 NOTE — Telephone Encounter (Signed)
PA for Horizant started on [facility] KEY [id number] Awaiting determination

## 2020-05-08 NOTE — Telephone Encounter (Signed)
Pt called stating that she just got off the phone with the insurance company and they informed her that they do require a PA for her Gabapentin Enacarbil ER (HORIZANT) 300 MG TBCR Please advise.

## 2020-05-20 ENCOUNTER — Ambulatory Visit: Payer: 59 | Admitting: Cardiovascular Disease

## 2020-05-27 ENCOUNTER — Other Ambulatory Visit: Payer: Self-pay | Admitting: Family Medicine

## 2020-05-27 DIAGNOSIS — I10 Essential (primary) hypertension: Secondary | ICD-10-CM

## 2020-05-27 DIAGNOSIS — F339 Major depressive disorder, recurrent, unspecified: Secondary | ICD-10-CM

## 2020-06-16 ENCOUNTER — Encounter: Payer: Self-pay | Admitting: Cardiovascular Disease

## 2020-06-16 NOTE — Progress Notes (Signed)
Cardiology Office Note:    Date:  06/17/2020   ID:  [name] [name], DOB 10/10/1965, MRN [id number]  PCP:  [name], DO  Cardiologist:   Mardel Grudzien  Electrophysiologist:  None   Referring MD: [name], DO   Chief Complaint  Patient presents with  . Hypertension  . Atrial Fibrillation     History of Present Illness:    [name] [name] is a 55 y.o. female with a hx of hypertension, hypothyroidism, hypokalemia, diabetes mellitus.  She was down in [place] visiting family.  She was just getting ready to go to bed and felt her heart started to race.  It was associated with chest pain.  She tried to calm herself down to convert her heart back to normal but was unsuccessful.  EMS was called.  They found her to be in atrial fibrillation with a rapid ventricular response.  She was taken to [facility] local [facility].  She had converted to sinus rhythm by the time she arrived and was hooked up to the EKG machine.  She was found to have hypokalemia.  Has a fairly constant upper left chest pain that has been present since this episode.   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  ( [place] )   Jan. 15, 2021:  2 weeks ago had an episode of slurred speech, became very hot,  Flushed,  Felt an adrenaline rush. Resolved after 10 min.  No neuro deficits. Has asked her to contact her primary MD Wt is 254 lbs She cut her toprol to 1/2 tab a day for slow HR and low bP  Is not exercising but she she did join a gym   June 17, 2020: [name] [name] is seen today for follow up of her PAF    CHADS2 VASC  Is 2 -4   ( female, HTN,  ? TIA last year)  She wants to come off her eliquis.   i've asked her to get a referral from Dr. Raoul Pitch to neuro to see if there is evidence of a previous CVA.   If she has not had a CVA, then we might decide that her CHADS2VASC is not high  enough to outweigh  the benefits of initiating stronger arthritis meds. If she has had a CVA, then I think her risk of having another CVA is too high to risk stopping the anticoagulation   Wt is 241 - down 13, lbs.   Trying to lose more  BP Is normal at home  Has arthritis.   Has a +  ANA  No palpitations.  Cont eliquis for now     Past Medical History:  Diagnosis Date  . Anxiety 01/12/2013  . Arthralgia 07/08/2016  . Asthma   . Atrial fibrillation ([place]) 12/06/2018  . B12 deficiency 01/31/2018  . Chicken pox   . Chronic insomnia 11/24/2016  . Clostridium difficile infection 04/02/2014, 02/06/2014  . Complex tear of medial meniscus of left knee as current injury 12/01/2018  . Complex tear of medial meniscus of left knee as current injury 12/01/2018  . DDD (degenerative disc disease), lumbar 09/09/2018  . Depression   . Diabetes mellitus without complication ([place]) 40/11/7351  . Dyslipidemia   . Elevated hemoglobin A1c 07/08/2016  . Estrogen deficiency 12/06/2018  . Foraminal stenosis of lumbar region 09/09/2018  . Hair  loss 07/20/2017  . History of lumbar fusion 09/09/2018  . Hormone replacement therapy (HRT) 07/20/2017  . HTN (hypertension)   . Hypertension 07/08/2016  . Hypokalemia 12/06/2018  . Hypothyroidism   . Left medial tibial plateau fracture 11/10/2018  . Lumbar radiculopathy 09/09/2018   Referred to NS 09/09/2018  . Migraine   . Mild memory disturbance 11/13/2013  . Morbid obesity ([place]) 07/08/2016  . Multiple allergies   . Obesity   . Restless legs syndrome (RLS) 01/12/2013   Follows with Dr. Jannifer Franklin who prescribes Requip and gabapentin  . RLS (restless legs syndrome)   . Vitamin D deficiency 12/06/2018  . Wears glasses 07/08/2016    Past Surgical History:  Procedure Laterality Date  . BREAST BIOPSY  [id number]   x 2 benign lesion  . CERVICAL DISCECTOMY  2003  . CERVICAL FUSION  2010   x2  . Hand fracture surgery    . LUMBAR FUSION  2016  . VAGINAL HYSTERECTOMY  2009     Current Medications: Current Meds  Medication Sig  . albuterol (VENTOLIN HFA) 108 (90 Base) MCG/ACT inhaler INHALE 2 PUFFS INTO THE LUNGS EVERY 6 HOURS AS NEEDED FOR WHEEZING OR SHORTNESS OF BREATH  . buPROPion (WELLBUTRIN XL) 300 MG 24 hr tablet Take 1 tablet (300 mg total) by mouth daily.  . cholecalciferol (VITAMIN D) 1000 units tablet Take 5,000 Units by mouth daily.   . DULoxetine (CYMBALTA) 60 MG capsule TAKE 1 CAPSULE BY MOUTH EVERY DAY  . ELIQUIS 5 MG TABS tablet TAKE 1 TABLET TWICE DAILY  . estradiol (ESTRACE) 2 MG tablet Take 1 tablet (2 mg total) by mouth daily.  . Gabapentin Enacarbil ER (HORIZANT) 300 MG TBCR TAKE 2 TABLETS BY MOUTH EVERY DAY AT 5PM WITH FOOD  . levothyroxine (SYNTHROID) 150 MCG tablet Take 1 tablet (150 mcg total) by mouth daily.  Marland Kitchen liothyronine (CYTOMEL) 5 MCG tablet Take 0.5-1 tablets (2.5-5 mcg total) by mouth daily.  . magnesium oxide (MAG-OX) 400 MG tablet Take 1 tablet (400 mg total) by mouth daily.  . meloxicam (MOBIC) 7.5 MG tablet Take 1 tablet (7.5 mg total) by mouth daily.  . metoprolol succinate (TOPROL-XL) 25 MG 24 hr tablet Take 0.5 tablets (12.5 mg total) by mouth daily.  . Omega-3 Fatty Acids (FISH OIL PO) Take 1 capsule by mouth daily.  . potassium chloride SA (KLOR-CON M20) 20 MEQ tablet Take 1 tablet (20 mEq total) by mouth daily. Please keep upcoming appt with Dr. Acie Fredrickson in March 2022 before anymore refills. Thank you  . progesterone (PROMETRIUM) 100 MG capsule Take 1 capsule (100 mg total) by mouth daily.  Marland Kitchen rOPINIRole (REQUIP XL) 4 MG 24 hr tablet Take 1 tablet (4 mg total) by mouth daily.  Marland Kitchen rOPINIRole (REQUIP) 2 MG tablet Take 1/2 tablet at lunch and 1/2 tablet at bed time.  Marland Kitchen telmisartan-hydrochlorothiazide (MICARDIS HCT) 80-25 MG tablet TAKE 1 TABLET BY MOUTH EVERY DAY     Allergies:   Bee venom, Yellow jacket venom, Biaxin [clarithromycin], Gabapentin, Abilify [aripiprazole], Buspar [buspirone], Lisinopril, Maxzide  [triamterene-hctz], Phentermine, and Seroquel [quetiapine fumarate]   Social History   Socioeconomic History  . Marital status: Married    Spouse name: [name]  . Number of children: 2  . Years of education: COLLEGE  . Highest education level: Not on file  Occupational History  . Occupation: OWNER    Employer: [employer]  Tobacco Use  . Smoking status: Never Smoker  . Smokeless tobacco: Never Used  Vaping  Use  . Vaping Use: Never used  Substance and Sexual Activity  . Alcohol use: No    Alcohol/week: 0.0 standard drinks  . Drug use: No  . Sexual activity: Yes    Partners: Male    Birth control/protection: None  Other Topics Concern  . Not on file  Social History Narrative   Patient is married [name])  2 children [name] and Lane)   Patient is right handed.   Patient has a college education. 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    Patient drinks 1 cups daily. Uses herbal remedies, takes a daily vitamin.   Wears her seatbelt, smoke detector in the home.   Social Determinants of Health   Financial Resource Strain: Not on file  Food Insecurity: Not on file  Transportation Needs: Not on file  Physical Activity: Not on file  Stress: Not on file  Social Connections: Not on file     Family History: The patient's family history includes Arthritis/Rheumatoid in her mother; COPD in her mother; Diabetes in her brother, father, and mother; Hearing loss in her father and mother; Heart disease in her father and mother; Hypertension in her father; Liver cancer in her mother; Mental illness in her mother.  ROS:   Please see the history of present illness.     All other systems reviewed and are negative.  EKGs/Labs/Other Studies Reviewed:    The following studies were reviewed today:   EKG: June 17, 2020: Normal sinus rhythm at 81.  No ST or T wave changes.  Recent Labs: 12/05/2019: TSH 3.78  Recent Lipid Panel    Component Value Date/Time   CHOL 150 08/02/2018 0902   TRIG  183.0 (H) 08/02/2018 0902   HDL 48.60 08/02/2018 0902   CHOLHDL 3 08/02/2018 0902   VLDL 36.6 08/02/2018 0902   LDLCALC 65 08/02/2018 0902    Physical Exam:    Physical Exam: Blood pressure (!) 148/74, pulse 81, height 5\' 3"  (1.6 m), weight 241 lb (109.3 kg), SpO2 97 %.  GEN:  modertely obese female, NAD , Body mass index is 42.69 kg/m.  HEENT: Normal NECK: No JVD; No carotid bruits LYMPHATICS: No lymphadenopathy CARDIAC: RRR  Soft systolc murmur  RESPIRATORY:  Clear to auscultation without rales, wheezing or rhonchi  ABDOMEN: Soft, non-tender, non-distended MUSCULOSKELETAL:  No edema; No deformity  SKIN: Warm and dry NEUROLOGIC:  Alert and oriented x 3    ASSESSMENT:    1. Paroxysmal atrial fibrillation (HCC)    PLAN:       1. PAF :   CHADS2VASC is  2 ( female, HTN,  )   she had some transient slurred speech last year.  She has not had a work-up for this from what I can see.  It is possible that this might have been a stroke or TIA.  She is interested in stopping her Eliquis in order to start some stronger arthritis medicines.  If her CHA2DS2-VASc score is only 2 then we could consider stopping temporarily in order for her to start some stronger medications and get her arthritis under control.  If she indeed has had a previous stroke or TIA then I think the risk of stopping anticoagulation is too high.    2.  Episode of slurred speech.   - ? TIA .   Encouraged her to have this worked up if possible   2.  Obesity:  Encouraged further weight loss    Medication Adjustments/Labs and Tests Ordered: Current medicines are reviewed  at length with the patient today.  Concerns regarding medicines are outlined above.  Orders Placed This Encounter  Procedures  . Basic metabolic panel  . CBC  . EKG 12-Lead   No orders of the defined types were placed in this encounter.   Patient Instructions  Medication Instructions:  Your physician recommends that you continue on your  current medications as directed. Please refer to the Current Medication list given to you today.  *If you need a refill on your cardiac medications before your next appointment, please call your pharmacy*   Lab Work: TODAY: CBC, BMET If you have labs (blood work) drawn today and your tests are completely normal, you will receive your results only by: Marland Kitchen MyChart Message (if you have MyChart) OR . A paper copy in the mail If you have any lab test that is abnormal or we need to change your treatment, we will call you to review the results.   Testing/Procedures: none   Follow-Up: At [facility], you and your health needs are our priority.  As part of our continuing mission to provide you with exceptional heart care, we have created designated Provider Care Teams.  These Care Teams include your primary Cardiologist (physician) and Advanced Practice Providers (APPs -  Physician Assistants and Nurse Practitioners) who all work together to provide you with the care you need, when you need it.  We recommend signing up for the patient portal called "MyChart".  Sign up information is provided on this After Visit Summary.  MyChart is used to connect with patients for Virtual Visits (Telemedicine).  Patients are able to view lab/test results, encounter notes, upcoming appointments, etc.  Non-urgent messages can be sent to your provider as well.   To learn more about what you can do with MyChart, go to [email or web address].    Your next appointment:   1 year(s)  The format for your next appointment:   In Person  Provider:   You may see Mertie Moores, MD or one of the following Advanced Practice Providers on your designated Care Team:    Richardson Dopp, PA-C  Robbie Lis, [place]        Signed, Mertie Moores, MD  06/17/2020 4:55 PM    [place]

## 2020-06-17 ENCOUNTER — Other Ambulatory Visit: Payer: Self-pay

## 2020-06-17 ENCOUNTER — Encounter: Payer: Self-pay | Admitting: Cardiovascular Disease

## 2020-06-17 ENCOUNTER — Ambulatory Visit (INDEPENDENT_AMBULATORY_CARE_PROVIDER_SITE_OTHER): Payer: 59 | Admitting: Cardiovascular Disease

## 2020-06-17 VITALS — BP 148/74 | HR 81 | Ht 63.0 in | Wt 241.0 lb

## 2020-06-17 DIAGNOSIS — I480 Paroxysmal atrial fibrillation: Secondary | ICD-10-CM | POA: Diagnosis not present

## 2020-06-17 NOTE — Patient Instructions (Signed)
Medication Instructions:  Your physician recommends that you continue on your current medications as directed. Please refer to the Current Medication list given to you today.  *If you need a refill on your cardiac medications before your next appointment, please call your pharmacy*   Lab Work: TODAY: CBC, BMET If you have labs (blood work) drawn today and your tests are completely normal, you will receive your results only by: Marland Kitchen MyChart Message (if you have MyChart) OR . A paper copy in the mail If you have any lab test that is abnormal or we need to change your treatment, we will call you to review the results.   Testing/Procedures: none   Follow-Up: At [facility], you and your health needs are our priority.  As part of our continuing mission to provide you with exceptional heart care, we have created designated Provider Care Teams.  These Care Teams include your primary Cardiologist (physician) and Advanced Practice Providers (APPs -  Physician Assistants and Nurse Practitioners) who all work together to provide you with the care you need, when you need it.  We recommend signing up for the patient portal called "MyChart".  Sign up information is provided on this After Visit Summary.  MyChart is used to connect with patients for Virtual Visits (Telemedicine).  Patients are able to view lab/test results, encounter notes, upcoming appointments, etc.  Non-urgent messages can be sent to your provider as well.   To learn more about what you can do with MyChart, go to [email or web address].    Your next appointment:   1 year(s)  The format for your next appointment:   In Person  Provider:   You may see Mertie Moores, MD or one of the following Advanced Practice Providers on your designated Care Team:    Richardson Dopp, PA-C  [place], [place]

## 2020-06-18 ENCOUNTER — Other Ambulatory Visit: Payer: Self-pay | Admitting: Family Medicine

## 2020-06-18 DIAGNOSIS — F339 Major depressive disorder, recurrent, unspecified: Secondary | ICD-10-CM

## 2020-06-18 DIAGNOSIS — I10 Essential (primary) hypertension: Secondary | ICD-10-CM

## 2020-06-18 LAB — BASIC METABOLIC PANEL
BUN/Creatinine Ratio: 38 — ABNORMAL HIGH (ref 9–23)
BUN: 24 mg/dL (ref 6–24)
CO2: 21 mmol/L (ref 20–29)
Calcium: 10.4 mg/dL — ABNORMAL HIGH (ref 8.7–10.2)
Chloride: 99 mmol/L (ref 96–106)
Creatinine, Ser: 0.63 mg/dL (ref 0.57–1.00)
Glucose: 92 mg/dL (ref 65–99)
Potassium: 4 mmol/L (ref 3.5–5.2)
Sodium: 139 mmol/L (ref 134–144)
eGFR: 106 mL/min/{1.73_m2} (ref >59)

## 2020-06-18 LAB — CBC
Hematocrit: 42.2 % (ref 34.0–46.6)
Hemoglobin: 14.4 g/dL (ref 11.1–15.9)
MCH: 31 pg (ref 26.6–33.0)
MCHC: 34.1 g/dL (ref 31.5–35.7)
MCV: 91 fL (ref 79–97)
Platelets: 309 10*3/uL (ref 150–450)
RBC: 4.65 x10E6/uL (ref 3.77–5.28)
RDW: 12.5 % (ref 11.7–15.4)
WBC: 9.3 10*3/uL (ref 3.4–10.8)

## 2020-06-22 ENCOUNTER — Other Ambulatory Visit: Payer: Self-pay | Admitting: Family Medicine

## 2020-06-22 DIAGNOSIS — F339 Major depressive disorder, recurrent, unspecified: Secondary | ICD-10-CM

## 2020-07-12 ENCOUNTER — Other Ambulatory Visit: Payer: Self-pay | Admitting: Family Medicine

## 2020-07-12 DIAGNOSIS — F339 Major depressive disorder, recurrent, unspecified: Secondary | ICD-10-CM

## 2020-07-12 DIAGNOSIS — M2550 Pain in unspecified joint: Secondary | ICD-10-CM

## 2020-07-15 ENCOUNTER — Other Ambulatory Visit: Payer: Self-pay | Admitting: Cardiovascular Disease

## 2020-07-15 NOTE — Telephone Encounter (Signed)
Eliquis 5mg  refill request received. Patient is 55 years old, weight-109.3kg, Crea-0.63 on 06/17/2020, Diagnosis-Afib, and last seen by Dr. Acie Fredrickson on 06/17/2020. Dose is appropriate based on dosing criteria. Will send in refill to requested pharmacy.

## 2020-07-23 ENCOUNTER — Telehealth: Payer: Self-pay

## 2020-07-23 NOTE — Telephone Encounter (Signed)
Blood pressure readings have been low, patient is concerned.  5/20  112 / 64 5/21  118 / 57 5/21  121 / 61 5/23  108 / 54 5/23  120 / 58 5/24 105 / 56 5/24 115 / 61  Please call patient [phone]

## 2020-07-23 NOTE — Telephone Encounter (Signed)
The reported readings are within normal range.

## 2020-07-23 NOTE — Telephone Encounter (Signed)
Spoke with patient regarding blood pressure readings. She is at home when taking bp and having episodes of feeling like she is going to pass out. She does not feel like bp readings are normal for her, still doing medication regimen as directed.  Please Advise.

## 2020-07-23 NOTE — Telephone Encounter (Signed)
Her blood pressure readings are normal. To lowest blood pressure reading she reported could be mildly low if having dizziness at that time.   She could be having dizziness or feeling like she is going to pass out for another cause.   I would encourage her to hydrate and if symptoms persist she can try to cut her blood pressure pill in half.  However her blood pressure goal still should be <130/80

## 2020-07-23 NOTE — Telephone Encounter (Signed)
Please advise if BP is below BP goal

## 2020-07-24 NOTE — Telephone Encounter (Signed)
Spoke with pt providers instructions.

## 2020-07-31 ENCOUNTER — Other Ambulatory Visit: Payer: Self-pay

## 2020-07-31 ENCOUNTER — Telehealth: Payer: Self-pay

## 2020-07-31 NOTE — Telephone Encounter (Signed)
Pt sched for VV 800 6/2

## 2020-07-31 NOTE — Telephone Encounter (Signed)
COVID + as of today, home test.  Fever 99.4, throwing up, body aches. Symptoms started today. Requesting info on antiviral med and OTC meds for helping of symptoms.  Patient can be reached at [phone]

## 2020-08-01 ENCOUNTER — Other Ambulatory Visit: Payer: Self-pay

## 2020-08-01 ENCOUNTER — Ambulatory Visit (INDEPENDENT_AMBULATORY_CARE_PROVIDER_SITE_OTHER): Payer: 59 | Admitting: Family Medicine

## 2020-08-01 VITALS — Temp 100.3°F

## 2020-08-01 DIAGNOSIS — R5081 Fever presenting with conditions classified elsewhere: Secondary | ICD-10-CM

## 2020-08-01 DIAGNOSIS — U071 COVID-19: Secondary | ICD-10-CM | POA: Diagnosis not present

## 2020-08-01 MED ORDER — NIRMATRELVIR/RITONAVIR (PAXLOVID)TABLET: 3.0000 | ORAL_TABLET | Freq: Two times a day (BID) | ORAL | 0 refills | Status: AC

## 2020-08-01 MED ORDER — ONDANSETRON HCL 4 MG PO TABS: 4.0000 mg | ORAL_TABLET | Freq: Three times a day (TID) | ORAL | 0 refills | Status: DC | PRN

## 2020-08-01 NOTE — Patient Instructions (Signed)

## 2020-08-01 NOTE — Progress Notes (Signed)
VIRTUAL VISIT VIA VIDEO  I connected with [name] on 08/01/20 at  8:00 AM EDT by elemedicine application and verified that I am speaking with the correct person using two identifiers. Location patient: Home Location provider: [facility], Office Persons participating in the virtual visit: Patient, Dr. Raoul Pitch and [name] Level. Cesar, CMA  I discussed the limitations of evaluation and management by telemedicine and the availability of in person appointments. The patient expressed understanding and agreed to proceed.   SUBJECTIVE Chief Complaint  Patient presents with  . Covid Positive    Pt tested pos at home 6/1; pt c/o fever 102.7, brain fog, body aches, HA, sore throat, fatigue, chills, sweats, x 1 day;    HPI: [name] [name] is a 55 y.o. female patient present for covid illness.  Home test: Positive 07/31/2020 Vaccines: declined Exposure: unknown Sx onset: 07/31/2020 Endorses fever Tmax 102.52F, brain fog, body aches, headache, sore throat, nausea, vomit, chills and sweats Denies current cough or shortness of breath.  She is now tolerating PO, but nausea remains. She reports symptoms came on very quickly.   ROS: See pertinent positives and negatives per HPI.  Patient Active Problem List   Diagnosis Date Noted  . Chronic anticoagulation 12/20/2019  . Hypokalemia 12/06/2018  . Atrial fibrillation ([place]) 12/06/2018  . Vitamin D deficiency 12/06/2018  . Estrogen deficiency 12/06/2018  . DDD (degenerative disc disease), lumbar 09/09/2018  . Foraminal stenosis of lumbar region 09/09/2018  . History of lumbar fusion 09/09/2018  . Lumbar radiculopathy 09/09/2018  . B12 deficiency 01/31/2018  . Hormone replacement therapy (HRT) 07/20/2017  . Hair loss 07/20/2017  . Chronic insomnia 11/24/2016  . Elevated hemoglobin A1c 07/08/2016  . Hypertension 07/08/2016  . Arthralgia 07/08/2016  . Morbid obesity ([place]) 07/08/2016  . Wears glasses 07/08/2016  . Hypothyroidism   .  Dyslipidemia   . Depression   . Mild memory disturbance 11/13/2013  . Restless legs syndrome (RLS) 01/12/2013    Social History   Tobacco Use  . Smoking status: Never Smoker  . Smokeless tobacco: Never Used  Substance Use Topics  . Alcohol use: No    Alcohol/week: 0.0 standard drinks    Current Outpatient Medications:  .  albuterol (VENTOLIN HFA) 108 (90 Base) MCG/ACT inhaler, INHALE 2 PUFFS INTO THE LUNGS EVERY 6 HOURS AS NEEDED FOR WHEEZING OR SHORTNESS OF BREATH, Disp: 6.7 each, Rfl: 1 .  amoxicillin (AMOXIL) 500 MG capsule, Take by mouth., Disp: , Rfl:  .  buPROPion (WELLBUTRIN XL) 300 MG 24 hr tablet, TAKE 1 TABLET BY MOUTH EVERY DAY, Disp: 30 tablet, Rfl: 0 .  cholecalciferol (VITAMIN D) 1000 units tablet, Take 5,000 Units by mouth daily. , Disp: , Rfl:  .  DULoxetine (CYMBALTA) 60 MG capsule, TAKE 1 CAPSULE BY MOUTH EVERY DAY, Disp: 30 capsule, Rfl: 0 .  ELIQUIS 5 MG TABS tablet, TAKE 1 TABLET BY MOUTH TWICE A DAY, Disp: 60 tablet, Rfl: 10 .  estradiol (ESTRACE) 2 MG tablet, Take 1 tablet (2 mg total) by mouth daily., Disp: 90 tablet, Rfl: 3 .  Gabapentin Enacarbil ER (HORIZANT) 300 MG TBCR, TAKE 2 TABLETS BY MOUTH EVERY DAY AT 5PM WITH FOOD, Disp: 60 tablet, Rfl: 6 .  levothyroxine (SYNTHROID) 150 MCG tablet, Take 1 tablet (150 mcg total) by mouth daily., Disp: 90 tablet, Rfl: 3 .  magnesium oxide (MAG-OX) 400 MG tablet, Take 1 tablet (400 mg total) by mouth daily., Disp: 90 tablet, Rfl: 3 .  meloxicam (MOBIC)  7.5 MG tablet, TAKE 1 TABLET BY MOUTH EVERY DAY, Disp: 30 tablet, Rfl: 0 .  metoprolol succinate (TOPROL-XL) 25 MG 24 hr tablet, Take 0.5 tablets (12.5 mg total) by mouth daily., Disp: 45 tablet, Rfl: 1 .  nirmatrelvir/ritonavir EUA (PAXLOVID) TABS, Take 3 tablets by mouth 2 (two) times daily for 5 days. Patient GFR is greater than 90.Take nirmatrelvir (150 mg) two tablets twice daily for 5 days and ritonavir (100 mg) one tablet twice daily for 5 days., Disp: 30 tablet, Rfl:  0 .  Omega-3 Fatty Acids (FISH OIL PO), Take 1 capsule by mouth daily., Disp: , Rfl:  .  ondansetron (ZOFRAN) 4 MG tablet, Take 1 tablet (4 mg total) by mouth every 8 (eight) hours as needed for nausea or vomiting., Disp: 20 tablet, Rfl: 0 .  potassium chloride SA (KLOR-CON M20) 20 MEQ tablet, Take 1 tablet (20 mEq total) by mouth daily. Please keep upcoming appt with Dr. Acie Fredrickson in March 2022 before anymore refills. Thank you, Disp: 90 tablet, Rfl: 0 .  progesterone (PROMETRIUM) 100 MG capsule, TAKE 1 CAPSULE BY MOUTH EVERY DAY, Disp: 30 capsule, Rfl: 0 .  rOPINIRole (REQUIP XL) 4 MG 24 hr tablet, Take 1 tablet (4 mg total) by mouth daily., Disp: 90 tablet, Rfl: 3 .  rOPINIRole (REQUIP) 2 MG tablet, Take 1/2 tablet at lunch and 1/2 tablet at bed time., Disp: 90 tablet, Rfl: 3  Allergies  Allergen Reactions  . Bee Venom Swelling and Anaphylaxis  . Yellow [place]et Venom Swelling  . Biaxin [Clarithromycin] Diarrhea and Rash  . Gabapentin Nausea And Vomiting    No reaction to Horizant  . Abilify [Aripiprazole] Other (See Comments)    MOOD SWING  . Buspar [Buspirone] Diarrhea  . Lisinopril Cough  . Maxzide [Triamterene-Hctz] Rash  . Phentermine Other (See Comments)    MOOD CHANGE   . Seroquel [Quetiapine Fumarate] Other (See Comments)    MOOD SWINGS    OBJECTIVE: Temp 100.3 F (37.9 C)  Gen: No acute distress. Nontoxic in appearance.  HENT: AT. [place].  MMM.  Eyes:Pupils Equal Round Reactive to light, Extraocular movements intact,  Conjunctiva without redness, discharge or icterus. Chest: Cough not present.  Skin: no rashes, purpura or petechiae.  Neuro:  Normal gait. Alert. Oriented x3  Psych: Normal affect and demeanor. Normal speech. Normal thought content and judgment.  ASSESSMENT AND PLAN: [name] [name] is a 55 y.o. female present for  COVID-19/Fever in other diseases Unvaccinated patient.  Rest, hydrate.  +/- flonase, mucinex (DM if cough), nettie pot or nasal saline.  OTC  tylenol 1/2 dose eliquis while on antiviral.  paxlovid prescribed, take until completed.  zofran for nausea.  Self isolate. Monitor for worsening symptoms. Emergent precautions discussed.  Follow 1-2 weeks not improving, Sooner if worsening (ED)    Howard Pouch, DO 08/01/2020   Return in about 2 weeks (around 08/15/2020), or if symptoms worsen or fail to improve.  No orders of the defined types were placed in this encounter.  Meds ordered this encounter  Medications  . ondansetron (ZOFRAN) 4 MG tablet    Sig: Take 1 tablet (4 mg total) by mouth every 8 (eight) hours as needed for nausea or vomiting.    Dispense:  20 tablet    Refill:  0  . nirmatrelvir/ritonavir EUA (PAXLOVID) TABS    Sig: Take 3 tablets by mouth 2 (two) times daily for 5 days. Patient GFR is greater than 90.Take nirmatrelvir (150 mg) two tablets twice daily  for 5 days and ritonavir (100 mg) one tablet twice daily for 5 days.    Dispense:  30 tablet    Refill:  0   Referral Orders  No referral(s) requested today

## 2020-08-04 ENCOUNTER — Other Ambulatory Visit: Payer: Self-pay | Admitting: Cardiovascular Disease

## 2020-08-08 ENCOUNTER — Other Ambulatory Visit: Payer: Self-pay | Admitting: Family Medicine

## 2020-08-09 ENCOUNTER — Other Ambulatory Visit: Payer: Self-pay | Admitting: Family Medicine

## 2020-08-13 ENCOUNTER — Other Ambulatory Visit: Payer: Self-pay | Admitting: Family Medicine

## 2020-08-13 DIAGNOSIS — F339 Major depressive disorder, recurrent, unspecified: Secondary | ICD-10-CM

## 2020-08-17 ENCOUNTER — Other Ambulatory Visit: Payer: Self-pay | Admitting: Family Medicine

## 2020-08-17 DIAGNOSIS — F339 Major depressive disorder, recurrent, unspecified: Secondary | ICD-10-CM

## 2020-08-17 DIAGNOSIS — M2550 Pain in unspecified joint: Secondary | ICD-10-CM

## 2020-08-21 ENCOUNTER — Telehealth (INDEPENDENT_AMBULATORY_CARE_PROVIDER_SITE_OTHER): Payer: 59 | Admitting: Neurology

## 2020-08-21 ENCOUNTER — Telehealth: Payer: Self-pay | Admitting: Neurology

## 2020-08-21 ENCOUNTER — Encounter: Payer: Self-pay | Admitting: Neurology

## 2020-08-21 DIAGNOSIS — G2581 Restless legs syndrome: Secondary | ICD-10-CM | POA: Diagnosis not present

## 2020-08-21 MED ORDER — GABAPENTIN 300 MG PO CAPS
300.0000 mg | ORAL_CAPSULE | Freq: Two times a day (BID) | ORAL | 3 refills | Status: DC
Start: 2020-08-21 — End: 2020-10-24

## 2020-08-21 MED ORDER — ROPINIROLE HCL 2 MG PO TABS: ORAL_TABLET | ORAL | 3 refills | Status: DC

## 2020-08-21 MED ORDER — ROPINIROLE HCL ER 4 MG PO TB24: 4.0000 mg | ORAL_TABLET | Freq: Every day | ORAL | 3 refills | Status: DC

## 2020-08-21 NOTE — Progress Notes (Signed)
   Virtual Visit via Video Note  I connected with [name] [name] on 08/21/20 at  2:45 PM EDT by a video enabled telemedicine application and verified that I am speaking with the correct person using two identifiers.  Location: Patient: at her home Provider: in the office   I discussed the limitations of evaluation and management by telemedicine and the availability of in person appointments. The patient expressed understanding and agreed to proceed.  History of Present Illness: 08/21/2020 SS: [name] [name] is a 55 year old female with history of RLS.  Currently feels that she has lost the benefit of Horizant.  She actually stopped it a couple months back.  Has been taking gabapentin IR 300 mg twice daily.  Feels this is working better.  Remains on her doses of Requip.  The Horizant was becoming expensive, she can no longer afford.  Denies any changes to her health.  Here today for evaluation via virtual visit.  08/21/2019 SS:[name] [name] is a 55 year old female history of restless leg syndrome.  Her symptoms are under good control as long as she takes her medications. She is taking Horizant 300 mg at 7 AM, again at 7 PM.  Requip ER 4 mg at 7 AM, Requip IR 2 mg at 7 PM.  If she takes her medications on schedule, she has no issues.  She needs refills.  Is tolerating without side effect.  She has done well to lose weight, is eating healthy, A1c is improved to 6.0.  Presents today for evaluation unaccompanied.   Observations/Objective: Via virtual visit, is alert and oriented, speech is clear and concise, facial symmetry noted, moves all extremities, gait appears steady and intact.  Assessment and Plan: 1.  Restless leg syndrome -Claims lost benefit of Horizant, additionally the cost was too high, will discontinue -Start gabapentin 300 mg twice daily, can increase if needed -Continue Requip XL 4 mg tablet at 7 AM -Continue Requip IR 2 mg tablet, 7 PM -Call for dose adjustment  Follow Up  Instructions: 1 year or sooner if needed, August 21, 2021 315   I discussed the assessment and treatment plan with the patient. The patient was provided an opportunity to ask questions and all were answered. The patient agreed with the plan and demonstrated an understanding of the instructions.   The patient was advised to call back or seek an in-person evaluation if the symptoms worsen or if the condition fails to improve as anticipated.  I spent 15 minutes dedicated to the care of this patient on the date of this encounter to include previsit review of records, medications, face-to-face time with the patient, and postvisit placement of orders.   Evangeline Dakin, DNP  [facility] [address], [place], [place] [address] [phone]

## 2020-08-21 NOTE — Telephone Encounter (Signed)
..   Pt understands that although there may be some limitations with this type of visit, we will take all precautions to reduce any security or privacy concerns.  Pt understands that this will be treated like an in office visit and we will file with pt's insurance, and there may be a patient responsible charge related to this service. ? ?

## 2020-08-21 NOTE — Progress Notes (Signed)
I have read the note, and I agree with the clinical assessment and plan.  Autry Droege K Thena Devora   

## 2020-08-26 ENCOUNTER — Other Ambulatory Visit: Payer: Self-pay

## 2020-08-26 MED ORDER — ESTRADIOL 2 MG PO TABS: 2.0000 mg | ORAL_TABLET | Freq: Every day | ORAL | 0 refills | Status: DC

## 2020-08-29 ENCOUNTER — Other Ambulatory Visit: Payer: Self-pay

## 2020-09-06 ENCOUNTER — Ambulatory Visit (HOSPITAL_BASED_OUTPATIENT_CLINIC_OR_DEPARTMENT_OTHER)
Admission: RE | Admit: 2020-09-06 | Discharge: 2020-09-06 | Disposition: A | Discharge status: Home / Self Care | Payer: 59 | Source: Ambulatory Visit | Attending: Family Medicine | Admitting: Family Medicine

## 2020-09-06 ENCOUNTER — Telehealth (INDEPENDENT_AMBULATORY_CARE_PROVIDER_SITE_OTHER): Payer: 59 | Admitting: Family Medicine

## 2020-09-06 ENCOUNTER — Other Ambulatory Visit: Payer: Self-pay

## 2020-09-06 ENCOUNTER — Encounter: Payer: Self-pay | Admitting: Family Medicine

## 2020-09-06 DIAGNOSIS — U071 COVID-19: Secondary | ICD-10-CM

## 2020-09-06 DIAGNOSIS — R5081 Fever presenting with conditions classified elsewhere: Secondary | ICD-10-CM | POA: Insufficient documentation

## 2020-09-06 DIAGNOSIS — R059 Cough, unspecified: Secondary | ICD-10-CM

## 2020-09-06 DIAGNOSIS — J208 Acute bronchitis due to other specified organisms: Secondary | ICD-10-CM

## 2020-09-06 HISTORY — DX: Acute bronchitis due to other specified organisms: J20.8

## 2020-09-06 HISTORY — DX: COVID-19: U07.1

## 2020-09-06 MED ORDER — AMOXICILLIN-POT CLAVULANATE 875-125 MG PO TABS: 1.0000 | ORAL_TABLET | Freq: Two times a day (BID) | ORAL | 0 refills | Status: DC

## 2020-09-06 MED ORDER — PREDNISONE 20 MG PO TABS: ORAL_TABLET | ORAL | 0 refills | Status: DC

## 2020-09-06 MED ORDER — HYDROCODONE BIT-HOMATROP MBR 5-1.5 MG/5ML PO SOLN: 5.0000 mL | Freq: Three times a day (TID) | ORAL | 0 refills | Status: DC | PRN

## 2020-09-06 NOTE — Patient Instructions (Signed)
Acute Bronchitis, Adult  Acute bronchitis is when air tubes in the lungs (bronchi) suddenly get swollen. The condition can make it hard for you to breathe. In adults, acute bronchitis usually goes away within 2 weeks. A cough caused by bronchitis may last up to 3 weeks. Smoking, allergies, and asthma can make thecondition worse. What are the causes? This condition is caused by: Cold and flu viruses. The most common cause of this condition is the virus that causes the common cold. Bacteria. Substances that irritate the lungs, including: Smoke from cigarettes and other types of tobacco. Dust and pollen. Fumes from chemicals, gases, or burned fuel. Other materials that pollute indoor or outdoor air. Close contact with someone who has acute bronchitis. What increases the risk? The following factors may make you more likely to develop this condition: A weak body's defense system. This is also called the immune system. Any condition that affects your lungs and breathing, such as asthma. What are the signs or symptoms? Symptoms of this condition include: A cough. Coughing up clear, yellow, or green mucus. Wheezing. Having too much mucus in your lungs (chest congestion). Shortness of breath. A fever. Chills. Body aches. A sore throat. How is this treated? Acute bronchitis may go away over time without treatment. Your doctor may recommend: Drinking more fluids. Using a device that gets medicine into your lungs (inhaler). Using a vaporizer or a humidifier. These are machines that add water or moisture to the air. This helps with coughing and poor breathing. Taking a medicine for fever. Taking a medicine that thins mucus and clears congestion. Taking a medicine that prevents or stops coughing. Follow these instructions at home: Activity Get a lot of rest. Return to your normal activities as told by your doctor. Ask your doctor what activities are safe for you. Lifestyle  Drink enough  fluid to keep your pee (urine) pale yellow. Do not drink alcohol. Do not use any products that contain nicotine or tobacco, such as cigarettes, e-cigarettes, and chewing tobacco. If you need help quitting, ask your doctor. Be aware that: Your bronchitis will get worse if you smoke or breathe in other people's smoke (secondhand smoke). Your lungs will heal faster if you quit smoking.  General instructions Take over-the-counter and prescription medicines only as told by your doctor. Use an inhaler, cool mist vaporizer, or humidifier as told by your doctor. Rinse your mouth often with salt water. To make salt water, dissolve -1 tsp (3-6 g) of salt in 1 cup (237 mL) of warm water. Take two teaspoons of honey at bedtime. This helps lessen your coughing at night. Keep all follow-up visits as told by your doctor. This is important. How is this prevented? To lower your risk of getting this condition again: Wash your hands often with soap and water. If you cannot use soap and water, use hand sanitizer. Avoid contact with people who have cold symptoms. Try not to touch your mouth, nose, or eyes with your hands. Make sure to get the flu shot every year. Contact a doctor if: Your symptoms do not get better in 2 weeks. You vomit more than once or twice. You have symptoms of loss of fluid from your body (dehydration). These include: Dark pee. Dry skin or eyes. Increased thirst. Headaches. Confusion. Muscle cramps. Get help right away if: You cough up blood. You have chest pain. You have very bad shortness of breath. You become dehydrated. You faint or keep feeling like you are going to faint. You   have a very bad headache. Your fever or chills get worse. These symptoms may be an emergency. Get help right away. Call your local emergency services (911 in the [place] Do not wait to see if the symptoms will go away. Do not drive yourself to the hospital. Summary Acute bronchitis is when air  tubes in the lungs (bronchi) suddenly get swollen. In adults, acute bronchitis usually goes away within 2 weeks. Take over-the-counter and prescription medicines only as told by your doctor. Drink enough fluid to keep your pee (urine) pale yellow. Contact a doctor if your symptoms do not improve after 2 weeks of treatment. Get help right away if you cough up blood, faint, or have chest pain or shortness of breath. This information is not intended to replace advice given to you by your health care provider. Make sure you discuss any questions you have with your healthcare provider. Document Revised: 01/17/2020 Document Reviewed: 09/09/2018 Elsevier Patient Education  2022 [employer]  

## 2020-09-06 NOTE — Progress Notes (Signed)
VIRTUAL VISIT VIA VIDEO  I connected with [name] [name] on 09/06/20 at  4:00 PM EDT by elemedicine application and verified that I am speaking with the correct person using two identifiers. Location patient: Home Location provider: [facility], Office Persons participating in the virtual visit: Patient, Dr. Raoul Pitch and Darnell Level. Cesar, CMA  I discussed the limitations of evaluation and management by telemedicine and the availability of in person appointments. The patient expressed understanding and agreed to proceed.   SUBJECTIVE Chief Complaint  Patient presents with   Cough    Pt c/o productive cough with yellowish, chest congestion x 6 weeks; low grade temp of 99.3 x 1 day    HPI: [name] [name] is 55 y.o. female present for acute illness.  Exposure:none Sx start: 6 weeks ago she endorsed sinus symptoms, chest congestion and tested positive for COVID.  She reports she had been starting to feel better, had some sinus-like mild symptoms and then today she she noticed a low-grade fever.  Over the last few weeks she has had a productive cough that is yellow and green.  She had been treated with Paxlovid. Vaccine:declined Pt denies shortness of breath GFR: > 100 Anticoag: n/a ROS: See pertinent positives and negatives per HPI.  Patient Active Problem List   Diagnosis Date Noted   Chronic anticoagulation 12/20/2019   Hypokalemia 12/06/2018   Atrial fibrillation ([place]) 12/06/2018   Vitamin D deficiency 12/06/2018   Estrogen deficiency 12/06/2018   DDD (degenerative disc disease), lumbar 09/09/2018   Foraminal stenosis of lumbar region 09/09/2018   History of lumbar fusion 09/09/2018   Lumbar radiculopathy 09/09/2018   B12 deficiency 01/31/2018   Hormone replacement therapy (HRT) 07/20/2017   Hair loss 07/20/2017   Chronic insomnia 11/24/2016   Elevated hemoglobin A1c 07/08/2016   Hypertension 07/08/2016   Arthralgia 07/08/2016   Morbid obesity ([place]) 07/08/2016   Wears  glasses 07/08/2016   Hypothyroidism    Dyslipidemia    Depression    Mild memory disturbance 11/13/2013   Restless legs syndrome (RLS) 01/12/2013    Social History   Tobacco Use   Smoking status: Never   Smokeless tobacco: Never  Substance Use Topics   Alcohol use: No    Alcohol/week: 0.0 standard drinks    Current Outpatient Medications:    amoxicillin-clavulanate (AUGMENTIN) 875-125 MG tablet, Take 1 tablet by mouth 2 (two) times daily., Disp: 20 tablet, Rfl: 0   buPROPion (WELLBUTRIN XL) 300 MG 24 hr tablet, TAKE 1 TABLET BY MOUTH EVERY DAY, Disp: 30 tablet, Rfl: 0   cholecalciferol (VITAMIN D) 1000 units tablet, Take 5,000 Units by mouth daily. , Disp: , Rfl:    DULoxetine (CYMBALTA) 60 MG capsule, TAKE 1 CAPSULE BY MOUTH EVERY DAY, Disp: 30 capsule, Rfl: 0   ELIQUIS 5 MG TABS tablet, TAKE 1 TABLET BY MOUTH TWICE A DAY, Disp: 60 tablet, Rfl: 10   estradiol (ESTRACE) 2 MG tablet, Take 1 tablet (2 mg total) by mouth daily., Disp: 30 tablet, Rfl: 0   gabapentin (NEURONTIN) 300 MG capsule, Take 1 capsule (300 mg total) by mouth 2 (two) times daily., Disp: 180 capsule, Rfl: 3   HYDROcodone bit-homatropine (HYCODAN) 5-1.5 MG/5ML syrup, Take 5 mLs by mouth every 8 (eight) hours as needed for cough., Disp: 120 mL, Rfl: 0   levothyroxine (SYNTHROID) 150 MCG tablet, Take 1 tablet (150 mcg total) by mouth daily., Disp: 90 tablet, Rfl: 3   magnesium oxide (MAG-OX) 400 MG tablet, Take 1 tablet (  400 mg total) by mouth daily., Disp: 90 tablet, Rfl: 3   meloxicam (MOBIC) 7.5 MG tablet, TAKE 1 TABLET BY MOUTH EVERY DAY, Disp: 30 tablet, Rfl: 0   Potassium Chloride ER 20 MEQ TBCR, Take 20 mEq by mouth daily., Disp: 90 tablet, Rfl: 3   predniSONE (DELTASONE) 20 MG tablet, 60 mg x3d, 40 mg x3d, 20 mg x2d, 10 mg x2d, Disp: 18 tablet, Rfl: 0   progesterone (PROMETRIUM) 100 MG capsule, TAKE 1 CAPSULE BY MOUTH EVERY DAY, Disp: 30 capsule, Rfl: 0   rOPINIRole (REQUIP XL) 4 MG 24 hr tablet, Take 1 tablet  (4 mg total) by mouth daily., Disp: 90 tablet, Rfl: 3   rOPINIRole (REQUIP) 2 MG tablet, Take 1/2 tablet at lunch and 1/2 tablet at bed time., Disp: 90 tablet, Rfl: 3   albuterol (VENTOLIN HFA) 108 (90 Base) MCG/ACT inhaler, INHALE 2 PUFFS INTO THE LUNGS EVERY 6 HOURS AS NEEDED FOR WHEEZING OR SHORTNESS OF BREATH (Patient not taking: Reported on 09/06/2020), Disp: 6.7 each, Rfl: 1   metoprolol succinate (TOPROL-XL) 25 MG 24 hr tablet, TAKE 1/2 TABLET BY MOUTH EVERY DAY (Patient not taking: Reported on 09/06/2020), Disp: 15 tablet, Rfl: 0   Omega-3 Fatty Acids (FISH OIL PO), Take 1 capsule by mouth daily. (Patient not taking: Reported on 09/06/2020), Disp: , Rfl:   Allergies  Allergen Reactions   Bee Venom Swelling and Anaphylaxis   Yellow Jacket Venom Swelling   Biaxin [Clarithromycin] Diarrhea and Rash   Gabapentin Nausea And Vomiting    No reaction to Horizant   Abilify [Aripiprazole] Other (See Comments)    MOOD SWING   Buspar [Buspirone] Diarrhea   Lisinopril Cough   Maxzide [Triamterene-Hctz] Rash   Phentermine Other (See Comments)    MOOD CHANGE    Seroquel [Quetiapine Fumarate] Other (See Comments)    MOOD SWINGS    OBJECTIVE: There were no vitals taken for this visit. Gen: No acute distress. Nontoxic in appearance.  HENT: AT. [place].  MMM.  Eyes:Pupils Equal Round Reactive to light, Extraocular movements intact,  Conjunctiva without redness, discharge or icterus. Chest: Cough present. No shortness of breath.  Neuro: Alert. Oriented x3  Psych: Normal affect and demeanor. Normal speech. Normal thought content and judgment.  ASSESSMENT AND PLAN: [name] [name] is a 55 y.o. female present for  Fever /cough/acute bronchitis due to COVID-19 virus - DG Chest 2 View; Future Rest, hydrate.  +/- flonase, mucinex (DM if cough), nettie pot or nasal saline.  Urged her to start Zyrtec before bed Augmentin twice daily prescribed, take until completed.  Prednisone taper prescribed Hycodan  cough syrup prescribed Chest x-ray ordered to rule out pneumonia status post COVID. And will be called with chest x-ray results and follow-up recommendations.   Howard Pouch, DO 09/06/2020   No follow-ups on file.  Orders Placed This Encounter  Procedures   DG Chest 2 View    Meds ordered this encounter  Medications   amoxicillin-clavulanate (AUGMENTIN) 875-125 MG tablet    Sig: Take 1 tablet by mouth 2 (two) times daily.    Dispense:  20 tablet    Refill:  0   predniSONE (DELTASONE) 20 MG tablet    Sig: 60 mg x3d, 40 mg x3d, 20 mg x2d, 10 mg x2d    Dispense:  18 tablet    Refill:  0   HYDROcodone bit-homatropine (HYCODAN) 5-1.5 MG/5ML syrup    Sig: Take 5 mLs by mouth every 8 (eight) hours as needed for  cough.    Dispense:  120 mL    Refill:  0    Referral Orders  No referral(s) requested today

## 2020-09-13 ENCOUNTER — Other Ambulatory Visit: Payer: Self-pay | Admitting: Family Medicine

## 2020-09-13 DIAGNOSIS — F339 Major depressive disorder, recurrent, unspecified: Secondary | ICD-10-CM

## 2020-09-13 DIAGNOSIS — M2550 Pain in unspecified joint: Secondary | ICD-10-CM

## 2020-09-26 ENCOUNTER — Other Ambulatory Visit: Payer: Self-pay | Admitting: Family Medicine

## 2020-10-01 ENCOUNTER — Encounter: Payer: Self-pay | Admitting: Family Medicine

## 2020-10-01 ENCOUNTER — Other Ambulatory Visit: Payer: Self-pay

## 2020-10-01 ENCOUNTER — Ambulatory Visit (INDEPENDENT_AMBULATORY_CARE_PROVIDER_SITE_OTHER): Payer: 59 | Admitting: Family Medicine

## 2020-10-01 VITALS — BP 151/78 | HR 67 | Temp 98.6°F | Ht 63.0 in | Wt 236.0 lb

## 2020-10-01 DIAGNOSIS — M5136 Other intervertebral disc degeneration, lumbar region: Secondary | ICD-10-CM

## 2020-10-01 DIAGNOSIS — M2550 Pain in unspecified joint: Secondary | ICD-10-CM | POA: Diagnosis not present

## 2020-10-01 DIAGNOSIS — U071 COVID-19: Secondary | ICD-10-CM

## 2020-10-01 DIAGNOSIS — I10 Essential (primary) hypertension: Secondary | ICD-10-CM

## 2020-10-01 DIAGNOSIS — E039 Hypothyroidism, unspecified: Secondary | ICD-10-CM

## 2020-10-01 DIAGNOSIS — Z1231 Encounter for screening mammogram for malignant neoplasm of breast: Secondary | ICD-10-CM

## 2020-10-01 DIAGNOSIS — E785 Hyperlipidemia, unspecified: Secondary | ICD-10-CM

## 2020-10-01 DIAGNOSIS — R21 Rash and other nonspecific skin eruption: Secondary | ICD-10-CM

## 2020-10-01 DIAGNOSIS — F339 Major depressive disorder, recurrent, unspecified: Secondary | ICD-10-CM | POA: Diagnosis not present

## 2020-10-01 DIAGNOSIS — M51369 Other intervertebral disc degeneration, lumbar region without mention of lumbar back pain or lower extremity pain: Secondary | ICD-10-CM

## 2020-10-01 DIAGNOSIS — E559 Vitamin D deficiency, unspecified: Secondary | ICD-10-CM

## 2020-10-01 DIAGNOSIS — I480 Paroxysmal atrial fibrillation: Secondary | ICD-10-CM

## 2020-10-01 DIAGNOSIS — R7309 Other abnormal glucose: Secondary | ICD-10-CM

## 2020-10-01 DIAGNOSIS — M5416 Radiculopathy, lumbar region: Secondary | ICD-10-CM

## 2020-10-01 DIAGNOSIS — Z981 Arthrodesis status: Secondary | ICD-10-CM

## 2020-10-01 DIAGNOSIS — M48061 Spinal stenosis, lumbar region without neurogenic claudication: Secondary | ICD-10-CM

## 2020-10-01 DIAGNOSIS — Z79890 Hormone replacement therapy (postmenopausal): Secondary | ICD-10-CM

## 2020-10-01 MED ORDER — CLOTRIMAZOLE 1 % EX CREA: 1.0000 "application " | TOPICAL_CREAM | Freq: Two times a day (BID) | CUTANEOUS | 0 refills | Status: DC

## 2020-10-01 MED ORDER — PROGESTERONE MICRONIZED 100 MG PO CAPS: 100.0000 mg | ORAL_CAPSULE | Freq: Every day | ORAL | 1 refills | Status: DC

## 2020-10-01 MED ORDER — TELMISARTAN 20 MG PO TABS: 20.0000 mg | ORAL_TABLET | Freq: Every day | ORAL | 0 refills | Status: DC

## 2020-10-01 MED ORDER — DULOXETINE HCL 60 MG PO CPEP: 60.0000 mg | ORAL_CAPSULE | Freq: Every day | ORAL | 1 refills | Status: DC

## 2020-10-01 MED ORDER — MELOXICAM 7.5 MG PO TABS: 7.5000 mg | ORAL_TABLET | Freq: Every day | ORAL | 1 refills | Status: DC

## 2020-10-01 MED ORDER — BUPROPION HCL ER (XL) 300 MG PO TB24: 300.0000 mg | ORAL_TABLET | Freq: Every day | ORAL | 1 refills | Status: DC

## 2020-10-01 MED ORDER — ESTRADIOL 2 MG PO TABS: 2.0000 mg | ORAL_TABLET | Freq: Every day | ORAL | 1 refills | Status: DC

## 2020-10-01 MED ORDER — ALBUTEROL SULFATE HFA 108 (90 BASE) MCG/ACT IN AERS: 2.0000 | INHALATION_SPRAY | Freq: Four times a day (QID) | RESPIRATORY_TRACT | 1 refills | Status: DC | PRN

## 2020-10-01 MED ORDER — METOPROLOL SUCCINATE ER 25 MG PO TB24: 12.5000 mg | ORAL_TABLET | Freq: Every day | ORAL | 1 refills | Status: DC

## 2020-10-01 NOTE — Patient Instructions (Signed)
Clotrimazole for your feet. If not seeing improvement call us. We would then call in different cream.   If you do see improvement> then continue for 4 weeks or until rash gone if longer.   Micradis 20 mg and metoprolol 12.5 mg a day.

## 2020-10-01 NOTE — Progress Notes (Signed)
Patient Care Team    Relationship Specialty Notifications Start End  [name], DO PCP - General Family Medicine  07/07/16   Nahser, Wonda Cheng, MD PCP - Cardiology Cardiology Admissions 12/15/18   Ob/Gyn, [name]    07/08/16   Wilford Corner, MD Consulting Physician Gastroenterology  07/08/16   [name], MD Consulting Physician Neurology  07/08/16   [name], MD Attending Physician Endocrinology  07/08/16    Comment: Pt established with Si Raider, NP at this location. - Integrative med- thyroid  [name], MD Consulting Physician Rehabilitation  03/06/19    Comment: Neurosurgeon-spine and scoliosis specialist    SUBJECTIVE Chief Complaint  Patient presents with   Hypertension    [place]; pt is not fasting    HPI: [name] [name] is a 55 y.o. female present for [facility] follow up.  Recurrent major depressive disorder, remission status unspecified (HCC) Reports compliance with Wellbutrin 300 mg daily and Cymbalta 60 mg daily. She is tolerating these medications well without any side effects.  Feels she is doing well on these medications.  Arthralgia, unspecified joint/lumbar degenerative disc disease/lumbar radiculopathy, foraminal stenosis of lumbar region Patient reports she continues to take the Mobic 7.5 mg nightly.  She has been counseled on the use of chronic NSAIDs with Eliquis.  She feels the medication is worth the risk, for the benefit.  She is also prescribed Requip and gabapentin for her restless leg syndrome through neurology.  Patient is prescribed Cymbalta 60 mg daily.  Hypertension/Morbid obesity/A.  Fib: Pt reports non-compliance with micardis 80-25 mg and metoprolol.  Patient reports she was having low blood pressures around [id number] systolic with dizziness.  She then stopped both of her blood pressure medications.  Patient is on apixaban for A. fib pt is not prescribed statin. Diet/exercise does not routinely watch diet and exercise RF:  Hypertension, obesity,fhx heart disease  Pre-diabetes: Metformin was prescribed however patient could not tolerate.  Her last A1c and off of medications 6.3 >5.5 >5.6.  She is doing excellent with diet control.   Hypothyroidism/Vit D def/HRT:  She reports compliance with levothyroxine 150 mcg daily on empty stomach and Cytomel 2.5 mcg-5 mcg daily.  She reports compliance with her estrogen and progesterone supplementations.  Her mammogram is up-to-date.  She is reporting hot flashes since starting the Cytomel-we have discussed using the least amount of Cytomel possible to help reduce hot flashes but give her the benefit of not having hair loss.  Her iron levels have been normal, vitamin D in the low normal.  She has felt in the past the Cytomel has been helpful for her hair loss.  Rash: Patient reports she has had a rash on her feet since having COVID.  She states this rash is itchy and burns.  The rash does not have any drainage.  It is not worsening or improving.  ROS: See pertinent positives and negatives per HPI.  Patient Active Problem List   Diagnosis Date Noted   Rash associated with COVID-19 10/02/2020   Acute bronchitis due to COVID-19 virus 09/06/2020   Acquired thrombophilia (HCC)-eliquis 12/20/2019   Hypokalemia 12/06/2018   Atrial fibrillation ([place]) 12/06/2018   Vitamin D deficiency 12/06/2018   Estrogen deficiency 12/06/2018   DDD (degenerative disc disease), lumbar 09/09/2018   Foraminal stenosis of lumbar region 09/09/2018   History of lumbar fusion 09/09/2018   Lumbar radiculopathy 09/09/2018   B12 deficiency 01/31/2018   Hormone replacement therapy (HRT) 07/20/2017  Hair loss 07/20/2017   Chronic insomnia 11/24/2016   Elevated hemoglobin A1c 07/08/2016   Hypertension 07/08/2016   Arthralgia 07/08/2016   Morbid obesity ([place]) 07/08/2016   Wears glasses 07/08/2016   Hypothyroidism    Dyslipidemia    Depression    Mild memory disturbance 11/13/2013   Restless legs  syndrome (RLS) 01/12/2013    Social History   Tobacco Use   Smoking status: Never   Smokeless tobacco: Never  Substance Use Topics   Alcohol use: No    Alcohol/week: 0.0 standard drinks    Current Outpatient Medications:    cholecalciferol (VITAMIN D) 1000 units tablet, Take 5,000 Units by mouth daily. , Disp: , Rfl:    clotrimazole (CLOTRIMAZOLE ANTI-FUNGAL) 1 % cream, Apply 1 application topically 2 (two) times daily., Disp: 30 g, Rfl: 0   ELIQUIS 5 MG TABS tablet, TAKE 1 TABLET BY MOUTH TWICE A DAY, Disp: 60 tablet, Rfl: 10   gabapentin (NEURONTIN) 300 MG capsule, Take 1 capsule (300 mg total) by mouth 2 (two) times daily., Disp: 180 capsule, Rfl: 3   levothyroxine (SYNTHROID) 150 MCG tablet, Take 1 tablet (150 mcg total) by mouth daily., Disp: 90 tablet, Rfl: 3   liothyronine (CYTOMEL) 5 MCG tablet, Take 2.5-5 mcg by mouth daily., Disp: , Rfl:    magnesium oxide (MAG-OX) 400 MG tablet, Take 1 tablet (400 mg total) by mouth daily., Disp: 90 tablet, Rfl: 3   Omega-3 Fatty Acids (FISH OIL PO), Take 1 capsule by mouth daily., Disp: , Rfl:    Potassium Chloride ER 20 MEQ TBCR, Take 20 mEq by mouth daily., Disp: 90 tablet, Rfl: 3   rOPINIRole (REQUIP XL) 4 MG 24 hr tablet, Take 1 tablet (4 mg total) by mouth daily., Disp: 90 tablet, Rfl: 3   rOPINIRole (REQUIP) 2 MG tablet, Take 1/2 tablet at lunch and 1/2 tablet at bed time., Disp: 90 tablet, Rfl: 3   telmisartan (MICARDIS) 20 MG tablet, Take 1 tablet (20 mg total) by mouth daily., Disp: 90 tablet, Rfl: 0   albuterol (VENTOLIN HFA) 108 (90 Base) MCG/ACT inhaler, Inhale 2 puffs into the lungs every 6 (six) hours as needed for wheezing or shortness of breath., Disp: 6.7 each, Rfl: 1   buPROPion (WELLBUTRIN XL) 300 MG 24 hr tablet, Take 1 tablet (300 mg total) by mouth daily., Disp: 90 tablet, Rfl: 1   DULoxetine (CYMBALTA) 60 MG capsule, Take 1 capsule (60 mg total) by mouth daily., Disp: 90 capsule, Rfl: 1   estradiol (ESTRACE) 2 MG tablet,  Take 1 tablet (2 mg total) by mouth daily., Disp: 90 tablet, Rfl: 1   meloxicam (MOBIC) 7.5 MG tablet, Take 1 tablet (7.5 mg total) by mouth daily., Disp: 90 tablet, Rfl: 1   metoprolol succinate (TOPROL-XL) 25 MG 24 hr tablet, Take 0.5 tablets (12.5 mg total) by mouth daily., Disp: 45 tablet, Rfl: 1   progesterone (PROMETRIUM) 100 MG capsule, Take 1 capsule (100 mg total) by mouth daily., Disp: 90 capsule, Rfl: 1  Allergies  Allergen Reactions   Bee Venom Swelling and Anaphylaxis   Yellow Jacket Venom Swelling   Biaxin [Clarithromycin] Diarrhea and Rash   Gabapentin Nausea And Vomiting    No reaction to Horizant   Abilify [Aripiprazole] Other (See Comments)    MOOD SWING   Buspar [Buspirone] Diarrhea   Lisinopril Cough   Maxzide [Triamterene-Hctz] Rash   Phentermine Other (See Comments)    MOOD CHANGE    Seroquel [Quetiapine Fumarate] Other (See Comments)  MOOD SWINGS    OBJECTIVE: BP (!) 151/78   Pulse 67   Temp 98.6 F (37 C) (Oral)   Ht '5\' 3"'$  (1.6 m)   Wt 236 lb (107 kg)   SpO2 98%   BMI 41.81 kg/m  Gen: Afebrile. No acute distress.  Nontoxic, pleasant, obese female. HENT: AT. [place].  Eyes:Pupils Equal Round Reactive to light, Extraocular movements intact,  Conjunctiva without redness, discharge or icterus. Neck/lymp/endocrine: Supple, no lymphadenopathy, no thyromegaly CV: RRR no murmur, no edema, +2/4 P posterior tibialis pulses Chest: CTAB, no wheeze or crackles Skin: No purpura or petechiae.  Small raised erythemic papules on toes and lateral aspects of feet bilaterally.  No drainage.  No obvious vesicles.  Mild dryness and scaliness on right foot.  No interdigital swelling, lesions or fissures. Neuro:  Normal gait. PERLA. EOMi. Alert. Oriented x3  Psych: Normal affect, dress and demeanor. Normal speech. Normal thought content and judgment.   ASSESSMENT AND PLAN: TRICHIA HIEB is a 55 y.o. female present for  Hypothyroidism/hair loss/HRT Stable -Continue  levothyroxine 150 mcg QD.  -Continue 2.5 to 5 mcg  -Continue estrogen and progesterone -Mammogram 12/26/2019.  Patient aware she needs yearly mammogram as long as on hormone therapy.  She reports understanding. -Follow-up 5.5 months  Recurrent major depressive disorder, remission status unspecified ([place]) Stable.  Continue Cymbalta 60 mg daily  Continue  Wellbutrin 300 mg daily  Follow-up 5.5 months, sooner if needed   Arthralgia, unspecified joint Stable. Discussed risks of increased bleeding with use of Mobic and Eliquis.  She wants to continue the lower dose Mobic, and has been stable without any bleeding noted. -Iron panel has been stable  -Requip and horizontal are prescribed by her neurology team. - Continue Cymbalta -Follow-up 5.5 months  Vitamin D deficiency: -Patient is taking 5000 units daily.   -continue  current supplementation  Essential hypertension/morbid obesity/PAF Significantly elevated today.  She reports having low blood pressures with dizziness associated. Restart Micardis at new low-dose of 20 mg (was 80 mg with HCTZ also) Restart metoprolol 12.5 mg daily Continue Eliquis prescribed by cardiology Continue  to follow with cardiology Low sodium.  Follow-up 5.5 months   Elevated hemoglobin A1c Congratulated her she did a great job of bringing down her A1c with diet and exercise alone A1c: 6.5 > 6.3> 6.0> 5.5 > 5.6 today  COVID rash: She does have some scaliness on her feet, concern for possible fungal versus rash secondary to COVID.  Elected to first treat with clotrimazole. If she is not seeing improvement in 2 weeks, she was instructed to call in and inform us.  I would then call in a steroid cream for her to try.  Orders Placed This Encounter  Procedures   MM 3D SCREEN BREAST BILATERAL   Microalbumin / creatinine urine ratio   CBC w/Diff   Basic Metabolic Panel (BMET)   Hemoglobin A1c   Extra Urine Specimen   POCT HgB A1C   Meds ordered this  encounter  Medications   albuterol (VENTOLIN HFA) 108 (90 Base) MCG/ACT inhaler    Sig: Inhale 2 puffs into the lungs every 6 (six) hours as needed for wheezing or shortness of breath.    Dispense:  6.7 each    Refill:  1   buPROPion (WELLBUTRIN XL) 300 MG 24 hr tablet    Sig: Take 1 tablet (300 mg total) by mouth daily.    Dispense:  90 tablet    Refill:  1   DULoxetine (  CYMBALTA) 60 MG capsule    Sig: Take 1 capsule (60 mg total) by mouth daily.    Dispense:  90 capsule    Refill:  1   estradiol (ESTRACE) 2 MG tablet    Sig: Take 1 tablet (2 mg total) by mouth daily.    Dispense:  90 tablet    Refill:  1   meloxicam (MOBIC) 7.5 MG tablet    Sig: Take 1 tablet (7.5 mg total) by mouth daily.    Dispense:  90 tablet    Refill:  1    MUST HAVE OV FOR FURTHER REFILLS   metoprolol succinate (TOPROL-XL) 25 MG 24 hr tablet    Sig: Take 0.5 tablets (12.5 mg total) by mouth daily.    Dispense:  45 tablet    Refill:  1   progesterone (PROMETRIUM) 100 MG capsule    Sig: Take 1 capsule (100 mg total) by mouth daily.    Dispense:  90 capsule    Refill:  1   telmisartan (MICARDIS) 20 MG tablet    Sig: Take 1 tablet (20 mg total) by mouth daily.    Dispense:  90 tablet    Refill:  0   clotrimazole (CLOTRIMAZOLE ANTI-FUNGAL) 1 % cream    Sig: Apply 1 application topically 2 (two) times daily.    Dispense:  30 g    Refill:  0    Referral Orders  No referral(s) requested today    This visit occurred during the SARS-CoV-2 public health emergency.  Safety protocols were in place, including screening questions prior to the visit, additional usage of staff PPE, and extensive cleaning of exam room while observing appropriate contact time as indicated for disinfecting solutions.       Howard Pouch, DO 10/02/2020

## 2020-10-02 DIAGNOSIS — U071 COVID-19: Secondary | ICD-10-CM

## 2020-10-02 DIAGNOSIS — Z889 Allergy status to unspecified drugs, medicaments and biological substances status: Secondary | ICD-10-CM | POA: Insufficient documentation

## 2020-10-02 DIAGNOSIS — R21 Rash and other nonspecific skin eruption: Secondary | ICD-10-CM

## 2020-10-02 HISTORY — DX: COVID-19: U07.1

## 2020-10-02 HISTORY — DX: Rash and other nonspecific skin eruption: R21

## 2020-10-06 LAB — CBC WITH DIFFERENTIAL/PLATELET
Absolute Monocytes: 631 cells/uL (ref 200–950)
Basophils Absolute: 42 cells/uL (ref 0–200)
Basophils Relative: 0.5 %
Eosinophils Absolute: 315 cells/uL (ref 15–500)
Eosinophils Relative: 3.8 %
HCT: 44.4 % (ref 35.0–45.0)
Hemoglobin: 14.4 g/dL (ref 11.7–15.5)
Lymphs Abs: 2880 cells/uL (ref 850–3900)
MCH: 29.8 pg (ref 27.0–33.0)
MCHC: 32.4 g/dL (ref 32.0–36.0)
MCV: 91.9 fL (ref 80.0–100.0)
MPV: 10.3 fL (ref 7.5–12.5)
Monocytes Relative: 7.6 %
Neutro Abs: 4432 cells/uL (ref 1500–7800)
Neutrophils Relative %: 53.4 %
Platelets: 307 10*3/uL (ref 140–400)
RBC: 4.83 10*6/uL (ref 3.80–5.10)
RDW: 13.2 % (ref 11.0–15.0)
Total Lymphocyte: 34.7 %
WBC: 8.3 10*3/uL (ref 3.8–10.8)

## 2020-10-06 LAB — EXTRA URINE SPECIMEN

## 2020-10-06 LAB — MICROALBUMIN / CREATININE URINE RATIO
Creatinine, Urine: 96 mg/dL (ref 20–275)
Microalb Creat Ratio: 6 mcg/mg creat (ref <30)
Microalb, Ur: 0.6 mg/dL

## 2020-10-06 LAB — BASIC METABOLIC PANEL
BUN: 17 mg/dL (ref 7–25)
CO2: 25 mmol/L (ref 20–32)
Calcium: 9.6 mg/dL (ref 8.6–10.4)
Chloride: 103 mmol/L (ref 98–110)
Creat: 0.57 mg/dL (ref 0.50–1.03)
Glucose, Bld: 89 mg/dL (ref 65–99)
Potassium: 4 mmol/L (ref 3.5–5.3)
Sodium: 138 mmol/L (ref 135–146)

## 2020-10-06 LAB — HEMOGLOBIN A1C
Hgb A1c MFr Bld: 5.6 % of total Hgb (ref <5.7)
Mean Plasma Glucose: 114 mg/dL
eAG (mmol/L): 6.3 mmol/L

## 2020-10-09 ENCOUNTER — Encounter: Payer: Self-pay | Admitting: Family Medicine

## 2020-10-09 NOTE — Telephone Encounter (Signed)
Please advise 

## 2020-10-23 ENCOUNTER — Encounter: Payer: Self-pay | Admitting: Neurology

## 2020-10-24 ENCOUNTER — Encounter: Payer: Self-pay | Admitting: Family Medicine

## 2020-10-24 ENCOUNTER — Other Ambulatory Visit: Payer: Self-pay | Admitting: Neurology

## 2020-10-24 MED ORDER — CLONAZEPAM 0.5 MG PO TABS: 0.5000 mg | ORAL_TABLET | Freq: Every day | ORAL | 2 refills | Status: DC

## 2020-10-28 ENCOUNTER — Encounter: Payer: Self-pay | Admitting: Family Medicine

## 2020-10-28 ENCOUNTER — Ambulatory Visit (INDEPENDENT_AMBULATORY_CARE_PROVIDER_SITE_OTHER): Payer: 59 | Admitting: Family Medicine

## 2020-10-28 ENCOUNTER — Other Ambulatory Visit: Payer: Self-pay

## 2020-10-28 VITALS — BP 144/81 | HR 75 | Temp 98.3°F | Ht 63.0 in | Wt 238.0 lb

## 2020-10-28 DIAGNOSIS — L659 Nonscarring hair loss, unspecified: Secondary | ICD-10-CM

## 2020-10-28 DIAGNOSIS — F339 Major depressive disorder, recurrent, unspecified: Secondary | ICD-10-CM | POA: Diagnosis not present

## 2020-10-28 LAB — CBC WITH DIFFERENTIAL/PLATELET
Basophils Absolute: 0.3 10*3/uL — ABNORMAL HIGH (ref 0.0–0.1)
Basophils Relative: 4.1 % — ABNORMAL HIGH (ref 0.0–3.0)
Eosinophils Absolute: 0.1 10*3/uL (ref 0.0–0.7)
Eosinophils Relative: 1.7 % (ref 0.0–5.0)
HCT: 41.3 % (ref 36.0–46.0)
Hemoglobin: 13.5 g/dL (ref 12.0–15.0)
Lymphocytes Relative: 34.3 % (ref 12.0–46.0)
Lymphs Abs: 2.5 10*3/uL (ref 0.7–4.0)
MCHC: 32.7 g/dL (ref 30.0–36.0)
MCV: 91.6 fl (ref 78.0–100.0)
Monocytes Absolute: 0.5 10*3/uL (ref 0.1–1.0)
Monocytes Relative: 6.8 % (ref 3.0–12.0)
Neutro Abs: 3.8 10*3/uL (ref 1.4–7.7)
Neutrophils Relative %: 53.1 % (ref 43.0–77.0)
Platelets: 250 10*3/uL (ref 150.0–400.0)
RBC: 4.51 Mil/uL (ref 3.87–5.11)
RDW: 14.1 % (ref 11.5–15.5)
WBC: 7.2 10*3/uL (ref 4.0–10.5)

## 2020-10-28 LAB — VITAMIN D 25 HYDROXY (VIT D DEFICIENCY, FRACTURES): VITD: 44.97 ng/mL (ref 30.00–100.00)

## 2020-10-28 LAB — TSH: TSH: 3.3 u[IU]/mL (ref 0.35–5.50)

## 2020-10-28 MED ORDER — MINOXIDIL 5 % EX SOLN: CUTANEOUS | 5 refills | Status: DC

## 2020-10-28 MED ORDER — TRAZODONE HCL 50 MG PO TABS: 25.0000 mg | ORAL_TABLET | Freq: Every evening | ORAL | 1 refills | Status: DC | PRN

## 2020-10-28 NOTE — Progress Notes (Signed)
This visit occurred during the SARS-CoV-2 public health emergency.  Safety protocols were in place, including screening questions prior to the visit, additional usage of staff PPE, and extensive cleaning of exam room while observing appropriate contact time as indicated for disinfecting solutions.    [name] [name] , 10/12/1965, 55 y.o., female MRN: [id number] Patient Care Team    Relationship Specialty Notifications Start End  [name], DO PCP - General Family Medicine  07/07/16   Nahser, Wonda Cheng, MD PCP - Cardiology Cardiology Admissions 12/15/18   Ob/Gyn, [name]    07/08/16   Wilford Corner, MD Consulting Physician Gastroenterology  07/08/16   [name], MD Consulting Physician Neurology  07/08/16   [name], MD Attending Physician Endocrinology  07/08/16    Comment: Pt established with Si Raider, NP at this location. - Integrative med- thyroid  [name], MD Consulting Physician Rehabilitation  03/06/19    Comment: Neurosurgeon-spine and scoliosis specialist    Chief Complaint  Patient presents with   Alopecia    Pt c/o hair loss x 10 days;      Subjective: Pt presents for an OV with complaints of hair loss of 10 days duration.  Associated symptoms include noticed loss of hair and hair thinning that has worsened over the last 10 days. . She has seen derm in the past for this condition. She has had a h/o vit d def and thyroid disease.  She admits to more stress over the last few months at home and is not sleeping well. Her neurologist had prescribed klonopin, but she was unable to tolerate med- caused over sedation.    Depression screen [facility] 2/9 10/02/2020 01/18/2020 12/05/2019 06/13/2019 07/27/2018  Decreased Interest 1 0 0 0 0  Down, Depressed, Hopeless 1 0 0 0 0  PHQ - 2 Score 2 0 0 0 0  Altered sleeping 2 - - 3 3  Tired, decreased energy 1 - - 3 3  Change in appetite 1 - - 3 3  Feeling bad or failure about yourself  0 - - 0 0  Trouble  concentrating 0 - - 1 0  Moving slowly or fidgety/restless 0 - - 0 0  Suicidal thoughts 0 - - 0 0  PHQ-9 Score 6 - - 10 9  Difficult doing work/chores - - - Not difficult at all Not difficult at all    Allergies  Allergen Reactions   Bee Venom Swelling and Anaphylaxis   Yellow Jacket Venom Swelling   Biaxin [Clarithromycin] Diarrhea and Rash   Gabapentin Nausea And Vomiting    No reaction to Horizant   Abilify [Aripiprazole] Other (See Comments)    MOOD SWING   Buspar [Buspirone] Diarrhea   Lisinopril Cough   Maxzide [Triamterene-Hctz] Rash   Phentermine Other (See Comments)    MOOD CHANGE    Seroquel [Quetiapine Fumarate] Other (See Comments)    MOOD SWINGS   Social History   Social History Narrative   Patient is married [name])  2 children [name] and [place])   Patient is right handed.   Patient has a college education. 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    Patient drinks 1 cups daily. Uses herbal remedies, takes a daily vitamin.   Wears her seatbelt, smoke detector in the home.   Past Medical History:  Diagnosis Date   Anxiety 01/12/2013   Arthralgia 07/08/2016   Asthma    Atrial fibrillation ([place]) 12/06/2018   B12 deficiency 01/31/2018  Chicken pox    Chronic insomnia 11/24/2016   Clostridium difficile infection 04/02/2014, 02/06/2014   Complex tear of medial meniscus of left knee as current injury 12/01/2018   Complex tear of medial meniscus of left knee as current injury 12/01/2018   DDD (degenerative disc disease), lumbar 09/09/2018   Depression    Diabetes mellitus without complication ([place]) [id number]   Dyslipidemia    Elevated hemoglobin A1c 07/08/2016   Estrogen deficiency 12/06/2018   Foraminal stenosis of lumbar region 09/09/2018   Hair loss 07/20/2017   History of lumbar fusion 09/09/2018   Hormone replacement therapy (HRT) 07/20/2017   HTN (hypertension)    Hypertension 07/08/2016   Hypokalemia 12/06/2018   Hypothyroidism    Left medial tibial plateau fracture 11/10/2018    Lumbar radiculopathy 09/09/2018   Referred to NS 09/09/2018   Migraine    Mild memory disturbance 11/13/2013   Morbid obesity ([place]) 07/08/2016   Multiple allergies    Obesity    Restless legs syndrome (RLS) 01/12/2013   Follows with Dr. Jannifer Franklin who prescribes Requip and gabapentin   RLS (restless legs syndrome)    Vitamin D deficiency 12/06/2018   Wears glasses 07/08/2016   Past Surgical History:  Procedure Laterality Date   BREAST BIOPSY  [id number]   x 2 benign lesion   CERVICAL DISCECTOMY  2003   CERVICAL FUSION  2010   x2   Hand fracture surgery     LUMBAR FUSION  2016   VAGINAL HYSTERECTOMY  2009   Family History  Problem Relation Age of Onset   Hypertension Father    Diabetes Father    Heart disease Father    Hearing loss Father    Diabetes Brother    Liver cancer Mother    COPD Mother    Mental illness Mother    Diabetes Mother    Hearing loss Mother    Heart disease Mother    Arthritis/Rheumatoid Mother    Allergies as of 10/28/2020       Reactions   Bee Venom Swelling, Anaphylaxis   Yellow Jacket Venom Swelling   Biaxin [clarithromycin] Diarrhea, Rash   Gabapentin Nausea And Vomiting   No reaction to Horizant   Abilify [aripiprazole] Other (See Comments)   MOOD SWING   Buspar [buspirone] Diarrhea   Lisinopril Cough   Maxzide [triamterene-hctz] Rash   Phentermine Other (See Comments)   MOOD CHANGE   Seroquel [quetiapine Fumarate] Other (See Comments)   MOOD SWINGS        Medication List        Accurate as of October 28, 2020  9:51 AM. If you have any questions, ask your nurse or doctor.          albuterol 108 (90 Base) MCG/ACT inhaler Commonly known as: VENTOLIN HFA Inhale 2 puffs into the lungs every 6 (six) hours as needed for wheezing or shortness of breath.   buPROPion 300 MG 24 hr tablet Commonly known as: WELLBUTRIN XL Take 1 tablet (300 mg total) by mouth daily.   cholecalciferol 1000 units tablet Commonly known as: VITAMIN D Take  5,000 Units by mouth daily.   clonazePAM 0.5 MG tablet Commonly known as: KLONOPIN Take 1 tablet (0.5 mg total) by mouth at bedtime.   clotrimazole 1 % cream Commonly known as: Clotrimazole Anti-Fungal Apply 1 application topically 2 (two) times daily.   DULoxetine 60 MG capsule Commonly known as: CYMBALTA Take 1 capsule (60 mg total) by mouth daily.   Eliquis 5 MG  Tabs tablet Generic drug: apixaban TAKE 1 TABLET BY MOUTH TWICE A DAY   estradiol 2 MG tablet Commonly known as: ESTRACE Take 1 tablet (2 mg total) by mouth daily.   FISH OIL PO Take 1 capsule by mouth daily.   levothyroxine 150 MCG tablet Commonly known as: SYNTHROID Take 1 tablet (150 mcg total) by mouth daily.   liothyronine 5 MCG tablet Commonly known as: CYTOMEL Take 2.5-5 mcg by mouth daily.   magnesium oxide 400 MG tablet Commonly known as: MAG-OX Take 1 tablet (400 mg total) by mouth daily.   meloxicam 7.5 MG tablet Commonly known as: MOBIC Take 1 tablet (7.5 mg total) by mouth daily.   metoprolol succinate 25 MG 24 hr tablet Commonly known as: TOPROL-XL Take 0.5 tablets (12.5 mg total) by mouth daily.   Potassium Chloride ER 20 MEQ Tbcr Take 20 mEq by mouth daily.   progesterone 100 MG capsule Commonly known as: PROMETRIUM Take 1 capsule (100 mg total) by mouth daily.   rOPINIRole 2 MG tablet Commonly known as: REQUIP Take 1/2 tablet at lunch and 1/2 tablet at bed time.   rOPINIRole 4 MG 24 hr tablet Commonly known as: REQUIP XL Take 1 tablet (4 mg total) by mouth daily.   telmisartan 20 MG tablet Commonly known as: Micardis Take 1 tablet (20 mg total) by mouth daily.        All past medical history, surgical history, allergies, family history, immunizations andmedications were updated in the EMR today and reviewed under the history and medication portions of their EMR.     ROS: Negative, with the exception of above mentioned in HPI   Objective:  BP (!) 144/81   Pulse 75    Temp 98.3 F (36.8 C) (Oral)   Ht '5\' 3"'$  (1.6 m)   Wt 238 lb (108 kg)   SpO2 97%   BMI 42.16 kg/m  Body mass index is 42.16 kg/m. Gen: Afebrile. No acute distress. Nontoxic in appearance, well developed, well nourished.  HENT: AT. [place]. Thinning hair throughout and > crown.  Eyes:Pupils Equal Round Reactive to light, Extraocular movements intact,  Conjunctiva without redness, discharge or icterus. Neck/lymp/endocrine: Supple,no lymphadenopathy Skin: no rashes, purpura or petechiae.  Neuro:  Normal gait. PERLA. EOMi. Alert. Oriented x3  Psych: Normal affect, dress and demeanor. Normal speech. Normal thought content and judgment.  No results found. No results found. No results found for this or any previous visit (from the past 24 hour(s)).  Assessment/Plan: [name] [name] is a 55 y.o. female present for OV for  Hair loss She has noticed worsening hair loss.  She does have some thinning of her hair and more severe thinning around the crown.  She has been evaluated by dermatology in the past. Will start with laboratory work-up - Iron, TIBC and Ferritin Panel - Vitamin D - TSH - CBC with Differential/Platelet Discussed minoxidil.  She would like to try this today.  She understands this likely is not covered by insurance.  She understands she would need to use for at least 6 months before seeing results.  Recurrent major depressive disorder, remission status unspecified ([place]) Discussed options with her today.  Continue Cymbalta 60 mg.  Continue Wellbutrin 300 mg.  Start trazodone taper 25-75 mg nightly.  Follow-up dependent upon laboratory results  Reviewed expectations re: course of current medical issues. Discussed self-management of symptoms. Outlined signs and symptoms indicating need for more acute intervention. Patient verbalized understanding and all questions were answered. Patient  received an Scientific laboratory technician.    No orders of the defined types were placed in this  encounter.  No orders of the defined types were placed in this encounter.  Referral Orders  No referral(s) requested today     Note is dictated utilizing voice recognition software. Although note has been proof read prior to signing, occasional typographical errors still can be missed. If any questions arise, please do not hesitate to call for verification.   electronically signed by:  Howard Pouch, DO  [place]

## 2020-10-28 NOTE — Patient Instructions (Signed)
Alopecia Areata, Adult ?Alopecia areata is a condition that causes hair loss. A person with this condition may lose hair on the scalp in patches. In some cases, a person may lose all the hair on the scalp or all the hair from the face and body. Having this condition can be emotionally difficult, but it is not dangerous. ?Alopecia areata is an autoimmune disease. This means that your body's defense system (immune system) mistakes normal parts of the body for germs or other things that can make you sick. When you have alopecia areata, the immune system attacks the hair follicles. ?What are the causes? ?The cause of this condition is not known. ?What increases the risk? ?You are more likely to develop this condition if you have: ?A family history of alopecia. ?A family history of another autoimmune disease, including type 1 diabetes and thyroid autoimmune disease. ?Eczema, asthma, and allergies. ?Down syndrome. ?What are the signs or symptoms? ?The main symptom of this condition is round spots of patchy hair loss on the scalp. The spots may be mildly itchy. Other symptoms include: ?Short dark hairs in the bald patches that are wider at the top (exclamation point hairs). ?Dents, white spots, or lines in the fingernails or toenails. ?Balding and body hair loss. This is rare. ?Alopecia areata usually develops in childhood, but it can develop at any age. For some people, their hair grows back on its own and hair loss does not happen again. For others, their hair may fall out and grow back in cycles. The hair loss may last many years. ?How is this diagnosed? ?This condition is diagnosed based on your symptoms and family history. Your health care provider will also check your scalp skin, teeth, and nails. Your health care provider may refer you to a specialist in hair and skin disorders (dermatologist). ?You may also have tests, including: ?A hair pull test. ?Blood tests or other screening tests to check for autoimmune  diseases, such as thyroid disease or diabetes. ?Skin biopsy to confirm the diagnosis. ?A procedure to examine the skin with a lighted magnifying instrument (dermoscopy). ?How is this treated? ?There is no cure for alopecia areata. The goals of treatment are to promote the regrowth of hair and prevent the immune system from overreacting. No single treatment is right for all people with alopecia areata. It depends on the type of hair loss you have and how severe it is. ?Work with your health care provider to find the best treatment for you. Treatment may include: ?Regular checkups to make sure the condition is not getting worse . This is called watchful waiting. ?Using steroid creams or pills for 6-8 weeks to stop the immune reaction and help hair to regrow more quickly. ?Using other medicines on your skin (topical medicines) to change the immune system response and support the hair growth cycle. ?Steroid injections. ?Therapy and counseling with a support group or therapist if you are having trouble coping with hair loss. ?Follow these instructions at home: ?Medicines ?Apply topical creams only as told by your health care provider. ?Take over-the-counter and prescription medicines only as told by your health care provider. ?General instructions ?Learn as much as you can about your condition. ?Consider getting a wig or products to make hair look fuller or to cover bald spots, if you feel uncomfortable with your appearance. ?Get therapy or counseling if you are having a hard time coping with hair loss. Ask your health care provider to recommend a counselor or support group. ?Keep all   follow-up visits as told by your health care provider. This is important. ?Where to find more information ?National Alopecia Areata Foundation: naaf.org ?Contact a health care provider if: ?Your hair loss gets worse, even with treatment. ?You have new symptoms. ?You are struggling emotionally. ?Get help right away if: ?You have a sudden  worsening of the hair loss. ?Summary ?Alopecia areata is an autoimmune condition that makes your body's defense system (immune system) attack the hair follicles. This causes you to lose hair. ?Having this condition can be emotionally difficult, but it is not dangerous. ?Treatments may include regular checkups to make sure that the condition is not getting worse, medicines, and steroid injections. ?This information is not intended to replace advice given to you by your health care provider. Make sure you discuss any questions you have with your health care provider. ?Document Revised: 05/02/2019 Document Reviewed: 05/02/2019 ?Elsevier Patient Education ? 2022 Elsevier Inc. ? ?

## 2020-10-29 ENCOUNTER — Telehealth (INDEPENDENT_AMBULATORY_CARE_PROVIDER_SITE_OTHER): Payer: 59 | Admitting: Family Medicine

## 2020-10-29 ENCOUNTER — Encounter: Payer: Self-pay | Admitting: Family Medicine

## 2020-10-29 DIAGNOSIS — L659 Nonscarring hair loss, unspecified: Secondary | ICD-10-CM | POA: Diagnosis not present

## 2020-10-29 LAB — IRON,TIBC AND FERRITIN PANEL
%SAT: 25 % (calc) (ref 16–45)
Ferritin: 116 ng/mL (ref 16–232)
Iron: 88 ug/dL (ref 45–160)
TIBC: 350 mcg/dL (calc) (ref 250–450)

## 2020-10-29 MED ORDER — BETAMETHASONE VALERATE 0.12 % EX FOAM: 100.0000 g | Freq: Every day | CUTANEOUS | 5 refills | Status: DC

## 2020-10-29 NOTE — Progress Notes (Signed)
VIRTUAL VISIT VIA VIDEO  I connected with [name] [name] on 10/31/20 at  2:15 PM EDT by elemedicine application and verified that I am speaking with the correct person using two identifiers. Location patient: Home Location provider: [facility], Office Persons participating in the virtual visit: Patient, [name] [name] and [name] [name]. [name] [name], CMA  I discussed the limitations of evaluation and management by telemedicine and the availability of in person appointments. The patient expressed understanding and agreed to proceed.   SUBJECTIVE Chief Complaint  Patient presents with   Alopecia    HPI: [name] [name] is 55 y.o. patient presents today to further discuss her hair loss.  Patient was seen this past week and laboratory work-up was initiated for worsening acute hair loss over the last 2 weeks.  She has had hair loss in the past without identification of cause.  She is extremely concerned at this time seems to be worse when her hair is thinning even with gentle brushing.  She had seen a dermatologist in the past but unfortunately no treatment plan was established.  She had normal iron, vitamin D, CBC and TSH this week.  Patient's TSH was 3.30.  She feels her hair loss is usually secondary to her thyroid levels.  She believes there is an association of hair loss when her thyroid levels become better replaced.  She believes she has less hair loss when thyroid is closer to a hypothyroid state.  ROS: See pertinent positives and negatives per HPI.  Patient Active Problem List   Diagnosis Date Noted   Rash associated with COVID-19 10/02/2020   Acute bronchitis due to COVID-19 virus 09/06/2020   Acquired thrombophilia (HCC)-eliquis 12/20/2019   Hypokalemia 12/06/2018   Atrial fibrillation ([place]) 12/06/2018   Vitamin D deficiency 12/06/2018   Estrogen deficiency 12/06/2018   DDD (degenerative disc disease), lumbar 09/09/2018   Foraminal stenosis of lumbar region 09/09/2018   History of lumbar  fusion 09/09/2018   Lumbar radiculopathy 09/09/2018   B12 deficiency 01/31/2018   Hormone replacement therapy (HRT) 07/20/2017   Hair loss 07/20/2017   Chronic insomnia 11/24/2016   Elevated hemoglobin A1c 07/08/2016   Hypertension 07/08/2016   Arthralgia 07/08/2016   Morbid obesity ([place]) 07/08/2016   Wears glasses 07/08/2016   Hypothyroidism    Dyslipidemia    Depression    Mild memory disturbance 11/13/2013   Restless legs syndrome (RLS) 01/12/2013    Social History   Tobacco Use   Smoking status: Never   Smokeless tobacco: Never  Substance Use Topics   Alcohol use: No    Alcohol/week: 0.0 standard drinks    Current Outpatient Medications:    albuterol (VENTOLIN HFA) 108 (90 Base) MCG/ACT inhaler, Inhale 2 puffs into the lungs every 6 (six) hours as needed for wheezing or shortness of breath., Disp: 6.7 each, Rfl: 1   Betamethasone Valerate 0.12 % foam, Apply 100 g topically daily., Disp: 300 g, Rfl: 5   buPROPion (WELLBUTRIN XL) 300 MG 24 hr tablet, Take 1 tablet (300 mg total) by mouth daily., Disp: 90 tablet, Rfl: 1   cholecalciferol (VITAMIN D) 1000 units tablet, Take 5,000 Units by mouth daily. , Disp: , Rfl:    clotrimazole (CLOTRIMAZOLE ANTI-FUNGAL) 1 % cream, Apply 1 application topically 2 (two) times daily., Disp: 30 g, Rfl: 0   DULoxetine (CYMBALTA) 60 MG capsule, Take 1 capsule (60 mg total) by mouth daily., Disp: 90 capsule, Rfl: 1   ELIQUIS 5 MG TABS tablet, TAKE 1 TABLET BY  MOUTH TWICE A DAY, Disp: 60 tablet, Rfl: 10   estradiol (ESTRACE) 2 MG tablet, Take 1 tablet (2 mg total) by mouth daily., Disp: 90 tablet, Rfl: 1   levothyroxine (SYNTHROID) 150 MCG tablet, Take 1 tablet (150 mcg total) by mouth daily., Disp: 90 tablet, Rfl: 3   liothyronine (CYTOMEL) 5 MCG tablet, Take 2.5-5 mcg by mouth daily., Disp: , Rfl:    magnesium oxide (MAG-OX) 400 MG tablet, Take 1 tablet (400 mg total) by mouth daily., Disp: 90 tablet, Rfl: 3   meloxicam (MOBIC) 7.5 MG tablet,  Take 1 tablet (7.5 mg total) by mouth daily., Disp: 90 tablet, Rfl: 1   metoprolol succinate (TOPROL-XL) 25 MG 24 hr tablet, Take 0.5 tablets (12.5 mg total) by mouth daily., Disp: 45 tablet, Rfl: 1   MINOXIDIL, TOPICAL, 5 % SOLN, Apply 1/2 cap BID to scalp., Disp: 60 mL, Rfl: 5   Omega-3 Fatty Acids (FISH OIL PO), Take 1 capsule by mouth daily., Disp: , Rfl:    Potassium Chloride ER 20 MEQ TBCR, Take 20 mEq by mouth daily., Disp: 90 tablet, Rfl: 3   progesterone (PROMETRIUM) 100 MG capsule, Take 1 capsule (100 mg total) by mouth daily., Disp: 90 capsule, Rfl: 1   rOPINIRole (REQUIP XL) 4 MG 24 hr tablet, Take 1 tablet (4 mg total) by mouth daily., Disp: 90 tablet, Rfl: 3   rOPINIRole (REQUIP) 2 MG tablet, Take 1/2 tablet at lunch and 1/2 tablet at bed time., Disp: 90 tablet, Rfl: 3   telmisartan (MICARDIS) 20 MG tablet, Take 1 tablet (20 mg total) by mouth daily., Disp: 90 tablet, Rfl: 0   traZODone (DESYREL) 50 MG tablet, Take 0.5-1.5 tablets (25-75 mg total) by mouth at bedtime as needed for sleep., Disp: 90 tablet, Rfl: 1  Allergies  Allergen Reactions   Bee Venom Swelling and Anaphylaxis   Yellow Jacket Venom Swelling   Biaxin [Clarithromycin] Diarrhea and Rash   Gabapentin Nausea And Vomiting    No reaction to Horizant   Abilify [Aripiprazole] Other (See Comments)    MOOD SWING   Buspar [Buspirone] Diarrhea   Lisinopril Cough   Maxzide [Triamterene-Hctz] Rash   Phentermine Other (See Comments)    MOOD CHANGE    Seroquel [Quetiapine Fumarate] Other (See Comments)    MOOD SWINGS    OBJECTIVE: There were no vitals taken for this visit. Gen: No acute distress. Nontoxic in appearance.  HENT: AT. [place].  MMM.  Eyes:Pupils Equal Round Reactive to light, Extraocular movements intact,  Conjunctiva without redness, discharge or icterus. Neuro:  Normal gait. Alert. Oriented x3  Psych: Normal affect and demeanor. Normal speech. Normal thought content and judgment.  ASSESSMENT AND  PLAN: [name] [name] is a 55 y.o. female present for  Hair loss: Lengthy conversation today surrounding hair loss and causes.  We discussed alopecia areata, she does not classically fit into the stage as the hair is thinning all over and more so in areas of the crown and her part/midline.  She does not have any other alopecia signs such as thinning of the eyebrows and eyelashes etc.  We discussed female/androgenic pattern hair loss and its treatment plan. Patient was provided with a prescription of minoxidil during her appointment, she has not started this and had concerns. After discussion today prescribed betamethasone foam to be applied twice daily.  If hair loss is secondary to inflammatory process or alopecia areata, hopefully this will slow it down for her.  She is aware she will not  see any hair growth for at least 1 to 2 months. She understands if she starts the minoxidil, she will not use the steroid foam.  She would not appreciate change in her hair for probably 5-6 months. We also placed a referral to dermatology/second opinion for her.  She was not happy with first dermatology team. Hypothyroidism: We discussed at length today that her thyroid is adequately replaced at 3.30.  She was given the option if she would like to have her thyroid less replaced, then she can take levothyroxine 150 mcg tabs x6 days a week and take half of a tab 1 day a week.  With an overall reduction of 75 mcg a week. -She could also discontinue the Cytomel.    Howard Pouch, DO 10/31/2020   No follow-ups on file.  Orders Placed This Encounter  Procedures   Ambulatory referral to Dermatology   Meds ordered this encounter  Medications   Betamethasone Valerate 0.12 % foam    Sig: Apply 100 g topically daily.    Dispense:  300 g    Refill:  5   Referral Orders         Ambulatory referral to Dermatology

## 2020-10-31 ENCOUNTER — Encounter: Payer: Self-pay | Admitting: Family Medicine

## 2020-10-31 NOTE — Patient Instructions (Signed)
Alopecia Areata, Adult ?Alopecia areata is a condition that causes hair loss. A person with this condition may lose hair on the scalp in patches. In some cases, a person may lose all the hair on the scalp or all the hair from the face and body. Having this condition can be emotionally difficult, but it is not dangerous. ?Alopecia areata is an autoimmune disease. This means that your body's defense system (immune system) mistakes normal parts of the body for germs or other things that can make you sick. When you have alopecia areata, the immune system attacks the hair follicles. ?What are the causes? ?The cause of this condition is not known. ?What increases the risk? ?You are more likely to develop this condition if you have: ?A family history of alopecia. ?A family history of another autoimmune disease, including type 1 diabetes and thyroid autoimmune disease. ?Eczema, asthma, and allergies. ?Down syndrome. ?What are the signs or symptoms? ?The main symptom of this condition is round spots of patchy hair loss on the scalp. The spots may be mildly itchy. Other symptoms include: ?Short dark hairs in the bald patches that are wider at the top (exclamation point hairs). ?Dents, white spots, or lines in the fingernails or toenails. ?Balding and body hair loss. This is rare. ?Alopecia areata usually develops in childhood, but it can develop at any age. For some people, their hair grows back on its own and hair loss does not happen again. For others, their hair may fall out and grow back in cycles. The hair loss may last many years. ?How is this diagnosed? ?This condition is diagnosed based on your symptoms and family history. Your health care provider will also check your scalp skin, teeth, and nails. Your health care provider may refer you to a specialist in hair and skin disorders (dermatologist). ?You may also have tests, including: ?A hair pull test. ?Blood tests or other screening tests to check for autoimmune  diseases, such as thyroid disease or diabetes. ?Skin biopsy to confirm the diagnosis. ?A procedure to examine the skin with a lighted magnifying instrument (dermoscopy). ?How is this treated? ?There is no cure for alopecia areata. The goals of treatment are to promote the regrowth of hair and prevent the immune system from overreacting. No single treatment is right for all people with alopecia areata. It depends on the type of hair loss you have and how severe it is. ?Work with your health care provider to find the best treatment for you. Treatment may include: ?Regular checkups to make sure the condition is not getting worse . This is called watchful waiting. ?Using steroid creams or pills for 6-8 weeks to stop the immune reaction and help hair to regrow more quickly. ?Using other medicines on your skin (topical medicines) to change the immune system response and support the hair growth cycle. ?Steroid injections. ?Therapy and counseling with a support group or therapist if you are having trouble coping with hair loss. ?Follow these instructions at home: ?Medicines ?Apply topical creams only as told by your health care provider. ?Take over-the-counter and prescription medicines only as told by your health care provider. ?General instructions ?Learn as much as you can about your condition. ?Consider getting a wig or products to make hair look fuller or to cover bald spots, if you feel uncomfortable with your appearance. ?Get therapy or counseling if you are having a hard time coping with hair loss. Ask your health care provider to recommend a counselor or support group. ?Keep all   follow-up visits as told by your health care provider. This is important. ?Where to find more information ?National Alopecia Areata Foundation: naaf.org ?Contact a health care provider if: ?Your hair loss gets worse, even with treatment. ?You have new symptoms. ?You are struggling emotionally. ?Get help right away if: ?You have a sudden  worsening of the hair loss. ?Summary ?Alopecia areata is an autoimmune condition that makes your body's defense system (immune system) attack the hair follicles. This causes you to lose hair. ?Having this condition can be emotionally difficult, but it is not dangerous. ?Treatments may include regular checkups to make sure that the condition is not getting worse, medicines, and steroid injections. ?This information is not intended to replace advice given to you by your health care provider. Make sure you discuss any questions you have with your health care provider. ?Document Revised: 05/02/2019 Document Reviewed: 05/02/2019 ?Elsevier Patient Education ? 2022 Elsevier Inc. ? ?

## 2020-11-05 ENCOUNTER — Encounter: Payer: Self-pay | Admitting: Family Medicine

## 2020-11-05 DIAGNOSIS — L659 Nonscarring hair loss, unspecified: Secondary | ICD-10-CM

## 2020-11-06 NOTE — Telephone Encounter (Signed)
Please advise  General 10/30/2020  9:00 AM [place], Pieter Partridge Received fax from [facility] stating they are currently not accepting new patient and they are not in network with [facility]. -  Note   Received fax from [facility] stating they are currently not accepting new patient and they are not in network with [facility].

## 2020-11-11 NOTE — Addendum Note (Signed)
Addended by: Octaviano Glow on: 11/11/2020 01:07 PM   Modules accepted: Orders

## 2020-11-11 NOTE — Addendum Note (Signed)
Addended by: Octaviano Glow on: 11/11/2020 01:12 PM   Modules accepted: Orders

## 2020-11-11 NOTE — Telephone Encounter (Signed)
Place derm referral again and send to in  network derm.  Thanks.

## 2020-11-20 ENCOUNTER — Encounter: Payer: Self-pay | Admitting: Family Medicine

## 2020-11-20 NOTE — Telephone Encounter (Signed)
Please advise on progesterone med.

## 2020-12-10 ENCOUNTER — Other Ambulatory Visit: Payer: Self-pay

## 2020-12-20 ENCOUNTER — Other Ambulatory Visit: Payer: Self-pay | Admitting: Family Medicine

## 2020-12-26 ENCOUNTER — Other Ambulatory Visit: Payer: Self-pay | Admitting: Family Medicine

## 2020-12-30 ENCOUNTER — Ambulatory Visit: Payer: 59

## 2021-02-03 ENCOUNTER — Ambulatory Visit
Admission: RE | Admit: 2021-02-03 | Discharge: 2021-02-03 | Disposition: A | Discharge status: Home / Self Care | Payer: 59 | Source: Ambulatory Visit | Attending: Family Medicine | Admitting: Family Medicine

## 2021-02-03 DIAGNOSIS — Z1231 Encounter for screening mammogram for malignant neoplasm of breast: Secondary | ICD-10-CM

## 2021-02-04 ENCOUNTER — Telehealth: Payer: Self-pay | Admitting: Family Medicine

## 2021-02-04 ENCOUNTER — Other Ambulatory Visit: Payer: Self-pay | Admitting: Family Medicine

## 2021-02-04 NOTE — Telephone Encounter (Signed)
Spoke with pt regarding labs and instructions.   

## 2021-02-04 NOTE — Telephone Encounter (Signed)
Please inform patient the following information: Mammogram is normal.   

## 2021-02-17 ENCOUNTER — Other Ambulatory Visit: Payer: Self-pay | Admitting: Family Medicine

## 2021-02-17 ENCOUNTER — Other Ambulatory Visit: Payer: Self-pay

## 2021-02-18 ENCOUNTER — Telehealth (INDEPENDENT_AMBULATORY_CARE_PROVIDER_SITE_OTHER): Payer: 59 | Admitting: Family Medicine

## 2021-02-18 ENCOUNTER — Other Ambulatory Visit: Payer: Self-pay

## 2021-02-18 ENCOUNTER — Encounter: Payer: Self-pay | Admitting: Family Medicine

## 2021-02-18 DIAGNOSIS — M48061 Spinal stenosis, lumbar region without neurogenic claudication: Secondary | ICD-10-CM

## 2021-02-18 DIAGNOSIS — M5136 Other intervertebral disc degeneration, lumbar region: Secondary | ICD-10-CM

## 2021-02-18 DIAGNOSIS — Z981 Arthrodesis status: Secondary | ICD-10-CM | POA: Diagnosis not present

## 2021-02-18 DIAGNOSIS — R21 Rash and other nonspecific skin eruption: Secondary | ICD-10-CM

## 2021-02-18 DIAGNOSIS — M5416 Radiculopathy, lumbar region: Secondary | ICD-10-CM

## 2021-02-18 DIAGNOSIS — G629 Polyneuropathy, unspecified: Secondary | ICD-10-CM | POA: Insufficient documentation

## 2021-02-18 MED ORDER — TRIAMCINOLONE ACETONIDE 0.1 % EX CREA: 1.0000 "application " | TOPICAL_CREAM | Freq: Two times a day (BID) | CUTANEOUS | 2 refills | Status: DC

## 2021-02-18 NOTE — Progress Notes (Addendum)
VIRTUAL VISIT VIA VIDEO  I connected with [name] on 02/18/2021 at  4:00 PM EST by a video enabled telemedicine application and verified that I am speaking with the correct person using two identifiers. Location patient: Home Location provider: [facility], Office Persons participating in the virtual visit: Patient, Dr. Raoul Pitch and Cyndra Numbers, CMA  I discussed the limitations of evaluation and management by telemedicine and the availability of in person appointments. The patient expressed understanding and agreed to proceed.     [name] [name] , April 10, 1965, 55 y.o., female MRN: [id number] Patient Care Team    Relationship Specialty Notifications Start End  [name], DO PCP - General Family Medicine  07/07/16   Nahser, Wonda Cheng, MD PCP - Cardiology Cardiology Admissions 12/15/18   Ob/Gyn, [name]    07/08/16   Wilford Corner, MD Consulting Physician Gastroenterology  07/08/16   [name], MD Consulting Physician Neurology  07/08/16   [name], MD Attending Physician Endocrinology  07/08/16    Comment: Pt established with Si Raider, NP at this location. - Integrative med- thyroid  [name], MD Consulting Physician Rehabilitation  03/06/19    Comment: Neurosurgeon-spine and scoliosis specialist    Chief Complaint  Patient presents with   Blister    Pt c/o b/l foot pain, numbness in toes, blister x 4 mos     Subjective: Pt presents for an OV with complaints of continued rash on her bilateral feet that she has noticed initially since she had COVID back in June.  She was tried on antifungal at that time and she states that the antifungal cream was soothing but never really took away the rash. She also complains of bilateral toe numbness.  She has a history of lumbar fusion in 2016.  She reports the numbness has started over the last 4-6 weeks.  She is not in contact with her neurology team any longer, and reports she would like a referral to  a new neurology team for further evaluation.  She denies worsening low back pain at this time.  Depression screen [facility] 2/9 10/02/2020 01/18/2020 12/05/2019 06/13/2019 07/27/2018  Decreased Interest 1 0 0 0 0  Down, Depressed, Hopeless 1 0 0 0 0  PHQ - 2 Score 2 0 0 0 0  Altered sleeping 2 - - 3 3  Tired, decreased energy 1 - - 3 3  Change in appetite 1 - - 3 3  Feeling bad or failure about yourself  0 - - 0 0  Trouble concentrating 0 - - 1 0  Moving slowly or fidgety/restless 0 - - 0 0  Suicidal thoughts 0 - - 0 0  PHQ-9 Score 6 - - 10 9  Difficult doing work/chores - - - Not difficult at all Not difficult at all    Allergies  Allergen Reactions   Bee Venom Swelling and Anaphylaxis   Yellow Jacket Venom Swelling   Biaxin [Clarithromycin] Diarrhea and Rash   Gabapentin Nausea And Vomiting    No reaction to Horizant   Abilify [Aripiprazole] Other (See Comments)    MOOD SWING   Buspar [Buspirone] Diarrhea   Lisinopril Cough   Maxzide [Triamterene-Hctz] Rash   Phentermine Other (See Comments)    MOOD CHANGE    Seroquel [Quetiapine Fumarate] Other (See Comments)    MOOD SWINGS   Social History   Social History Narrative   Patient is married [name] [name])  2 children [name] [name] and [name] [name])  Patient is right handed.   Patient has a college education. 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    Patient drinks 1 cups daily. Uses herbal remedies, takes a daily vitamin.   Wears her seatbelt, smoke detector in the home.   Past Medical History:  Diagnosis Date   Acute bronchitis due to COVID-19 virus 09/06/2020   Anxiety 01/12/2013   Arthralgia 07/08/2016   Asthma    Atrial fibrillation ([place]) 12/06/2018   B12 deficiency 01/31/2018   Chicken pox    Chronic insomnia 11/24/2016   Clostridium difficile infection 04/02/2014, 02/06/2014   Complex tear of medial meniscus of left knee as current injury 12/01/2018   Complex tear of medial meniscus of left knee as current injury 12/01/2018   DDD (degenerative disc  disease), lumbar 09/09/2018   Depression    Diabetes mellitus without complication ([place]) 93/08/9022   Dyslipidemia    Elevated hemoglobin A1c 07/08/2016   Estrogen deficiency 12/06/2018   Foraminal stenosis of lumbar region 09/09/2018   Hair loss 07/20/2017   History of lumbar fusion 09/09/2018   Hormone replacement therapy (HRT) 07/20/2017   HTN (hypertension)    Hypertension 07/08/2016   Hypokalemia 12/06/2018   Hypothyroidism    Left medial tibial plateau fracture 11/10/2018   Lumbar radiculopathy 09/09/2018   Referred to NS 09/09/2018   Migraine    Mild memory disturbance 11/13/2013   Morbid obesity ([place]) 07/08/2016   Multiple allergies    Obesity    Restless legs syndrome (RLS) 01/12/2013   Follows with Dr. Jannifer Franklin who prescribes Requip and gabapentin   RLS (restless legs syndrome)    Vitamin D deficiency 12/06/2018   Wears glasses 07/08/2016   Past Surgical History:  Procedure Laterality Date   BREAST BIOPSY  0973,5329   x 2 benign lesion   CERVICAL DISCECTOMY  2003   CERVICAL FUSION  2010   x2   Hand fracture surgery     LUMBAR FUSION  2016   VAGINAL HYSTERECTOMY  2009   Family History  Problem Relation Age of Onset   Hypertension Father    Diabetes Father    Heart disease Father    Hearing loss Father    Diabetes Brother    Liver cancer Mother    COPD Mother    Mental illness Mother    Diabetes Mother    Hearing loss Mother    Heart disease Mother    Arthritis/Rheumatoid Mother    Allergies as of 02/18/2021       Reactions   Bee Venom Swelling, Anaphylaxis   Yellow Jacket Venom Swelling   Biaxin [clarithromycin] Diarrhea, Rash   Gabapentin Nausea And Vomiting   No reaction to Horizant   Abilify [aripiprazole] Other (See Comments)   MOOD SWING   Buspar [buspirone] Diarrhea   Lisinopril Cough   Maxzide [triamterene-hctz] Rash   Phentermine Other (See Comments)   MOOD CHANGE   Seroquel [quetiapine Fumarate] Other (See Comments)   MOOD SWINGS        Medication  List        Accurate as of February 18, 2021  4:33 PM. If you have any questions, ask your nurse or doctor.          STOP taking these medications    Betamethasone Valerate 0.12 % foam Stopped by: Howard Pouch, DO   MINOXIDIL (TOPICAL) 5 % Soln Stopped by: Howard Pouch, DO       TAKE these medications    albuterol 108 (90 Base) MCG/ACT inhaler Commonly  known as: VENTOLIN HFA Inhale 2 puffs into the lungs every 6 (six) hours as needed for wheezing or shortness of breath.   buPROPion 300 MG 24 hr tablet Commonly known as: WELLBUTRIN XL Take 1 tablet (300 mg total) by mouth daily.   cholecalciferol 1000 units tablet Commonly known as: VITAMIN D Take 5,000 Units by mouth daily.   clotrimazole 1 % cream Commonly known as: Clotrimazole Anti-Fungal Apply 1 application topically 2 (two) times daily.   DULoxetine 60 MG capsule Commonly known as: CYMBALTA Take 1 capsule (60 mg total) by mouth daily.   Eliquis 5 MG Tabs tablet Generic drug: apixaban TAKE 1 TABLET BY MOUTH TWICE A DAY   estradiol 2 MG tablet Commonly known as: ESTRACE Take 1 tablet (2 mg total) by mouth daily.   FISH OIL PO Take 1 capsule by mouth daily.   levothyroxine 150 MCG tablet Commonly known as: SYNTHROID Take 1 tablet (150 mcg total) by mouth daily.   liothyronine 5 MCG tablet Commonly known as: CYTOMEL Take 2.5-5 mcg by mouth daily.   magnesium oxide 400 MG tablet Commonly known as: MAG-OX TAKE 1 TABLET BY MOUTH EVERY DAY   meloxicam 7.5 MG tablet Commonly known as: MOBIC Take 1 tablet (7.5 mg total) by mouth daily.   metoprolol succinate 25 MG 24 hr tablet Commonly known as: TOPROL-XL Take 0.5 tablets (12.5 mg total) by mouth daily.   Potassium Chloride ER 20 MEQ Tbcr Take 20 mEq by mouth daily.   progesterone 100 MG capsule Commonly known as: PROMETRIUM Take 1 capsule (100 mg total) by mouth daily.   rOPINIRole 2 MG tablet Commonly known as: REQUIP Take 1/2 tablet at  lunch and 1/2 tablet at bed time.   rOPINIRole 4 MG 24 hr tablet Commonly known as: REQUIP XL Take 1 tablet (4 mg total) by mouth daily.   telmisartan 20 MG tablet Commonly known as: Micardis Take 1 tablet (20 mg total) by mouth daily.   traZODone 50 MG tablet Commonly known as: DESYREL Take 0.5-1.5 tablets (25-75 mg total) by mouth at bedtime as needed for sleep.   triamcinolone cream 0.1 % Commonly known as: KENALOG Apply 1 application topically 2 (two) times daily. Started by: Howard Pouch, DO        All past medical history, surgical history, allergies, family history, immunizations andmedications were updated in the EMR today and reviewed under the history and medication portions of their EMR.     ROS Negative, with the exception of above mentioned in HPI   Objective:  There were no vitals taken for this visit. There is no height or weight on file to calculate BMI. Physical Exam Constitutional:      Appearance: Normal appearance.  HENT:     Head: Normocephalic and atraumatic.  Eyes:     General: No scleral icterus.       Right eye: No discharge.        Left eye: No discharge.     Extraocular Movements: Extraocular movements intact.     Pupils: Pupils are equal, round, and reactive to light.  Skin:    Findings: Rash present.  Neurological:     Mental Status: She is alert and oriented to person, place, and time. Mental status is at baseline.     Sensory: Sensory deficit present.     No results found. No results found. No results found for this or any previous visit (from the past 24 hour(s)).  Assessment/Plan: CATTIE TINEO is a  55 y.o. female present for OV for  History of lumbar fusion/Foraminal stenosis of lumbar region/Lumbar radiculopathy/DDD (degenerative disc disease), lumbar/Neuropathy Concern for her lumbar fusion integrity versus progression of stenosis with her reports of bilateral toe numbness.  I have referred her to a new neurosurgery team  for further evaluation. - Ambulatory referral to Neurosurgery  Rash of foot Antifungal was thinking, but did not resolve rash.  Will attempt steroid cream twice daily.  If this is not helpful, would likely need to refer to dermatology or podiatry for further evaluation.  Reviewed expectations re: course of current medical issues. Discussed self-management of symptoms. Outlined signs and symptoms indicating need for more acute intervention. Patient verbalized understanding and all questions were answered. Patient received an After-Visit Summary.    Orders Placed This Encounter  Procedures   Ambulatory referral to Neurosurgery   Meds ordered this encounter  Medications   triamcinolone cream (KENALOG) 0.1 %    Sig: Apply 1 application topically 2 (two) times daily.    Dispense:  45 g    Refill:  2   Referral Orders         Ambulatory referral to Neurosurgery       Note is dictated utilizing voice recognition software. Although note has been proof read prior to signing, occasional typographical errors still can be missed. If any questions arise, please do not hesitate to call for verification.   electronically signed by:  Howard Pouch, DO  [place]

## 2021-02-20 ENCOUNTER — Other Ambulatory Visit: Payer: Self-pay

## 2021-02-20 MED ORDER — LEVOTHYROXINE SODIUM 150 MCG PO TABS
150.0000 ug | ORAL_TABLET | Freq: Every day | ORAL | 0 refills | Status: DC
Start: 2021-02-20 — End: 2021-03-25

## 2021-02-21 ENCOUNTER — Other Ambulatory Visit: Payer: Self-pay | Admitting: Family Medicine

## 2021-03-18 ENCOUNTER — Ambulatory Visit (HOSPITAL_BASED_OUTPATIENT_CLINIC_OR_DEPARTMENT_OTHER)
Admission: RE | Admit: 2021-03-18 | Discharge: 2021-03-18 | Disposition: A | Discharge status: Home / Self Care | Payer: 59 | Source: Ambulatory Visit | Attending: Family Medicine | Admitting: Family Medicine

## 2021-03-18 ENCOUNTER — Encounter: Payer: Self-pay | Admitting: Family Medicine

## 2021-03-18 ENCOUNTER — Other Ambulatory Visit: Payer: Self-pay

## 2021-03-18 ENCOUNTER — Ambulatory Visit (INDEPENDENT_AMBULATORY_CARE_PROVIDER_SITE_OTHER): Payer: 59 | Admitting: Family Medicine

## 2021-03-18 VITALS — BP 174/93 | HR 81 | Temp 98.3°F | Ht 63.0 in | Wt 260.6 lb

## 2021-03-18 DIAGNOSIS — R0781 Pleurodynia: Secondary | ICD-10-CM

## 2021-03-18 DIAGNOSIS — R0789 Other chest pain: Secondary | ICD-10-CM | POA: Diagnosis present

## 2021-03-18 DIAGNOSIS — R9389 Abnormal findings on diagnostic imaging of other specified body structures: Secondary | ICD-10-CM

## 2021-03-18 DIAGNOSIS — T148XXA Other injury of unspecified body region, initial encounter: Secondary | ICD-10-CM

## 2021-03-18 MED ORDER — METHOCARBAMOL 500 MG PO TABS: 500.0000 mg | ORAL_TABLET | Freq: Three times a day (TID) | ORAL | 0 refills | Status: DC | PRN

## 2021-03-18 NOTE — Patient Instructions (Addendum)
Great to see you today.  I have refilled the medication(s) we provide.   Rotator Cuff Tear A rotator cuff tear is a partial or complete tear of the cord-like bands (tendons) that connect muscle to bone in the rotator cuff. The rotator cuff is a group of muscles and tendons that surround the shoulder joint and keep the upper arm bone (humerus) in the shoulder socket. The tear can occur suddenly (acute tear) or can develop over a long period of time (chronic tear). What are the causes? Acute tears may be caused by: A fall, especially on an outstretched arm. Lifting very heavy objects with a jerking motion. Chronic tears may be caused by overuse of the muscles. This may happen in sports, physical work, or activities in which your arm repeatedly moves over your head. What increases the risk? This condition is more likely to occur in: Athletes and workers who frequently use their shoulder or reach over their head. This may include activities such as: Tennis. Baseball and softball. Swimming and rowing. Weight lifting. Construction work. Painting. People who smoke. Older people who have arthritis or poor blood supply. These can make the muscles and tendons weaker. What are the signs or symptoms? Symptoms of this condition depend on the type and severity of the injury: An acute tear may include a sudden tearing feeling, followed by severe pain that goes from your upper shoulder, down your arm, and toward your elbow. A chronic tear includes a gradual weakness and decreased shoulder motion as the pain gets worse. The pain is usually worse at night. Both types may have symptoms such as: Pain that spreads (radiates) from the shoulder to the upper arm. Swelling and tenderness in front of the shoulder. Decreased range of motion. Pain when: Reaching, pulling, or lifting the arm above the head. Lowering the arm from above the head. Not being able to raise your arm out to the side. Difficulty  placing the arm behind your back. How is this diagnosed? This condition is diagnosed with a medical history and physical exam. Imaging tests may also be done, including: X-rays. MRI. Ultrasound. CT or MR arthrogram. During this test, a contrast material is injected into your shoulder, and then images are taken. How is this treated? Treatment for this condition depends on the type and severity of the condition. In less severe cases, treatment may include: Rest. This may be done with a sling that holds the shoulder still (immobilization). Your health care provider may also recommend avoiding activities that involve lifting your arm over your head. Icing the shoulder. Anti-inflammatory medicines, such as aspirin or ibuprofen. Strengthening and stretching exercises. Your health care provider may recommend specific exercises to improve your range of motion and strengthen your shoulder. In more severe cases, treatment may include: Physical therapy. Steroid injections. Surgery. Follow these instructions at home: Managing pain, stiffness, and swelling   If directed, put ice on the injured area. To do this: If you have a removable sling, remove it as told by your health care provider. Put ice in a plastic bag. Place a towel between your skin and the bag. Leave the ice on for 20 minutes, 2-3 times a day. Remove the ice if your skin turns bright red. This is very important. If you cannot feel pain, heat, or cold, you have a greater risk of damage to the area. Raise (elevate) the injured area above the level of your heart while you are lying down. Find a comfortable sleeping position or sleep on  a recliner, if available. Move your fingers often to reduce stiffness and swelling. Once the swelling has gone down, your health care provider may direct you to apply heat to relax the muscles. Use the heat source that your health care provider recommends, such as a moist heat pack or a heating pad. Place a  towel between your skin and the heat source. Leave the heat on for 20-30 minutes. Remove the heat if your skin turns bright red. This is especially important if you are unable to feel pain, heat, or cold. You have a greater risk of getting burned. If you have a sling: Wear the sling as told by your health care provider. Remove it only as told by your health care provider. Loosen the sling if your fingers tingle, become numb, or turn cold and blue. Keep the sling clean. If the sling is not waterproof: Do not let it get wet. Cover it with a watertight covering when you take a bath or a shower. Activity Rest your shoulder as told by your health care provider. Return to your normal activities as told by your health care provider. Ask your health care provider what activities are safe for you. Ask your health care provider when it is safe to drive if you have a sling on your arm. Do any exercises or stretches as told by your health care provider. General instructions Take over-the-counter and prescription medicines only as told by your health care provider. Do not use any products that contain nicotine or tobacco, such as cigarettes, e-cigarettes, and chewing tobacco. If you need help quitting, ask your health care provider. Keep all follow-up visits. This is important. Contact a health care provider if: Your pain gets worse. You have new pain in your arm, hands, or fingers. Medicine does not help your pain. Get help right away if: Your arm, hand, or fingers are numb or tingling. Your arm, hand, or fingers are swollen or painful or they turn white or blue. Your hand or fingers on your injured arm are colder than your other hand. Summary A rotator cuff tear is a partial or complete tear of the cord-like bands (tendons) that connect muscle to bone in the rotator cuff. The tear can occur suddenly (acute tear) or can develop over a long period of time (chronic tear). Treatment generally includes  rest, anti-inflammatory medicines, and icing. In some cases, physical therapy and steroid injections may be needed. In severe cases, surgery may be needed. This information is not intended to replace advice given to you by your health care provider. Make sure you discuss any questions you have with your health care provider. Document Revised: 06/21/2019 Document Reviewed: 06/21/2019 Elsevier Patient Education  2022 [employer].

## 2021-03-18 NOTE — Progress Notes (Signed)
This visit occurred during the SARS-CoV-2 public health emergency.  Safety protocols were in place, including screening questions prior to the visit, additional usage of staff PPE, and extensive cleaning of exam room while observing appropriate contact time as indicated for disinfecting solutions.    [name] [name] , May 25, 1965, 56 y.o., female MRN: [id number] Patient Care Team    Relationship Specialty Notifications Start End  [name], DO PCP - General Family Medicine  07/07/16   Nahser, Wonda Cheng, MD PCP - Cardiology Cardiology Admissions 12/15/18   Ob/Gyn, [name]    07/08/16   Wilford Corner, MD Consulting Physician Gastroenterology  07/08/16   [name], MD (Inactive) Consulting Physician Neurology  07/08/16   [name], MD Attending Physician Endocrinology  07/08/16    Comment: Pt established with Si Raider, NP at this location. - Integrative med- thyroid  [name], MD Consulting Physician Rehabilitation  03/06/19    Comment: Neurosurgeon-spine and scoliosis specialist    Chief Complaint  Patient presents with   Acute Visit    pain x 6 weeks lateral trunk. Started as a hard knot that got worse now makes her SOBr and cough a lot.      Subjective: Pt presents for an OV with complaints of pain lateral trunk inferior to left axilla.  She reports she woke with the pain 1 morning about 6 weeks ago.  The area was tender to touch and she felt like there was a knot located in this area.  She thought she pulled a muscle and was having her husband massage the area.  She denies any known injury or change in activity around that time.  She has noticed laying on her right side can make the area on the left bother her more. She states the area is still tender to touch.  She also has discomfort with arm abduction and extension on the left.  She states she has noticed some mild weakness in her left arm as well. In addition, she has noticed over the last couple weeks  she has developed a mild cough that is sometimes productive with "bubbly white "phlegm.  She states she has noticed breathing is starting to become uncomfortable, especially with activity.  She denies any fevers.  She states she may have had a few chills during the last few weeks. She denies any travel. She denies missing any doses of her Eliquis. She denies any recent illness. She denies any leg swelling or tenderness. She is prescribed hormone therapy. She has no prior history of blood clots.  Depression screen [facility] 2/9 03/18/2021 10/02/2020 01/18/2020 12/05/2019 06/13/2019  Decreased Interest 0 1 0 0 0  Down, Depressed, Hopeless 0 1 0 0 0  PHQ - 2 Score 0 2 0 0 0  Altered sleeping 0 2 - - 3  Tired, decreased energy 0 1 - - 3  Change in appetite 0 1 - - 3  Feeling bad or failure about yourself  0 0 - - 0  Trouble concentrating 0 0 - - 1  Moving slowly or fidgety/restless 0 0 - - 0  Suicidal thoughts 0 0 - - 0  PHQ-9 Score 0 6 - - 10  Difficult doing work/chores Not difficult at all - - - Not difficult at all    Allergies  Allergen Reactions   Bee Venom Swelling and Anaphylaxis   Yellow Jacket Venom Swelling   Biaxin [Clarithromycin] Diarrhea and Rash   Gabapentin Nausea And  Vomiting    No reaction to Horizant   Abilify [Aripiprazole] Other (See Comments)    MOOD SWING   Buspar [Buspirone] Diarrhea   Lisinopril Cough   Maxzide [Triamterene-Hctz] Rash   Phentermine Other (See Comments)    MOOD CHANGE    Seroquel [Quetiapine Fumarate] Other (See Comments)    MOOD SWINGS   Social History   Social History Narrative   Patient is married [name] [name])  2 children [name] [name] and [name] [name])   Patient is right handed.   Patient has a college education. 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    Patient drinks 1 cups daily. Uses herbal remedies, takes a daily vitamin.   Wears her seatbelt, smoke detector in the home.   Past Medical History:  Diagnosis Date   Acute bronchitis due to COVID-19 virus 09/06/2020    Anxiety 01/12/2013   Arthralgia 07/08/2016   Asthma    Atrial fibrillation ([place]) 12/06/2018   B12 deficiency 01/31/2018   Chicken pox    Chronic insomnia 11/24/2016   Clostridium difficile infection 04/02/2014, 02/06/2014   Complex tear of medial meniscus of left knee as current injury 12/01/2018   Complex tear of medial meniscus of left knee as current injury 12/01/2018   DDD (degenerative disc disease), lumbar 09/09/2018   Depression    Diabetes mellitus without complication ([place]) 44/0/1027   Dyslipidemia    Elevated hemoglobin A1c 07/08/2016   Estrogen deficiency 12/06/2018   Foraminal stenosis of lumbar region 09/09/2018   Hair loss 07/20/2017   History of lumbar fusion 09/09/2018   Hormone replacement therapy (HRT) 07/20/2017   HTN (hypertension)    Hypertension 07/08/2016   Hypokalemia 12/06/2018   Hypothyroidism    Left medial tibial plateau fracture 11/10/2018   Lumbar radiculopathy 09/09/2018   Referred to NS 09/09/2018   Migraine    Mild memory disturbance 11/13/2013   Morbid obesity ([place]) 07/08/2016   Multiple allergies    Obesity    Restless legs syndrome (RLS) 01/12/2013   Follows with Dr. Jannifer Franklin who prescribes Requip and gabapentin   RLS (restless legs syndrome)    Vitamin D deficiency 12/06/2018   Wears glasses 07/08/2016   Past Surgical History:  Procedure Laterality Date   BREAST BIOPSY  2536,6440   x 2 benign lesion   CERVICAL DISCECTOMY  2003   CERVICAL FUSION  2010   x2   Hand fracture surgery     LUMBAR FUSION  2016   VAGINAL HYSTERECTOMY  2009   Family History  Problem Relation Age of Onset   Hypertension Father    Diabetes Father    Heart disease Father    Hearing loss Father    Diabetes Brother    Liver cancer Mother    COPD Mother    Mental illness Mother    Diabetes Mother    Hearing loss Mother    Heart disease Mother    Arthritis/Rheumatoid Mother    Allergies as of 03/18/2021       Reactions   Bee Venom Swelling, Anaphylaxis   Yellow Jacket Venom  Swelling   Biaxin [clarithromycin] Diarrhea, Rash   Gabapentin Nausea And Vomiting   No reaction to Horizant   Abilify [aripiprazole] Other (See Comments)   MOOD SWING   Buspar [buspirone] Diarrhea   Lisinopril Cough   Maxzide [triamterene-hctz] Rash   Phentermine Other (See Comments)   MOOD CHANGE   Seroquel [quetiapine Fumarate] Other (See Comments)   MOOD SWINGS        Medication List  Accurate as of March 18, 2021 11:59 PM. If you have any questions, ask your nurse or doctor.          albuterol 108 (90 Base) MCG/ACT inhaler Commonly known as: VENTOLIN HFA Inhale 2 puffs into the lungs every 6 (six) hours as needed for wheezing or shortness of breath.   buPROPion 300 MG 24 hr tablet Commonly known as: WELLBUTRIN XL Take 1 tablet (300 mg total) by mouth daily.   cholecalciferol 1000 units tablet Commonly known as: VITAMIN D Take 5,000 Units by mouth daily.   clotrimazole 1 % cream Commonly known as: Clotrimazole Anti-Fungal Apply 1 application topically 2 (two) times daily.   DULoxetine 60 MG capsule Commonly known as: CYMBALTA Take 1 capsule (60 mg total) by mouth daily.   Eliquis 5 MG Tabs tablet Generic drug: apixaban TAKE 1 TABLET BY MOUTH TWICE A DAY   estradiol 2 MG tablet Commonly known as: ESTRACE Take 1 tablet (2 mg total) by mouth daily.   FISH OIL PO Take 1 capsule by mouth daily.   levothyroxine 150 MCG tablet Commonly known as: SYNTHROID Take 1 tablet (150 mcg total) by mouth daily. MUST HAVE OV FOR FURTHER REFILLS   liothyronine 5 MCG tablet Commonly known as: CYTOMEL Take 2.5-5 mcg by mouth daily.   magnesium oxide 400 MG tablet Commonly known as: MAG-OX TAKE 1 TABLET BY MOUTH EVERY DAY   meloxicam 7.5 MG tablet Commonly known as: MOBIC Take 1 tablet (7.5 mg total) by mouth daily.   methocarbamol 500 MG tablet Commonly known as: Robaxin Take 1 tablet (500 mg total) by mouth every 8 (eight) hours as needed for muscle  spasms. Started by: Howard Pouch, DO   metoprolol succinate 25 MG 24 hr tablet Commonly known as: TOPROL-XL Take 0.5 tablets (12.5 mg total) by mouth daily.   Potassium Chloride ER 20 MEQ Tbcr Take 20 mEq by mouth daily.   progesterone 100 MG capsule Commonly known as: PROMETRIUM Take 1 capsule (100 mg total) by mouth daily.   rOPINIRole 2 MG tablet Commonly known as: REQUIP Take 1/2 tablet at lunch and 1/2 tablet at bed time.   rOPINIRole 4 MG 24 hr tablet Commonly known as: REQUIP XL Take 1 tablet (4 mg total) by mouth daily.   telmisartan 20 MG tablet Commonly known as: Micardis Take 1 tablet (20 mg total) by mouth daily.   traZODone 50 MG tablet Commonly known as: DESYREL Take 0.5-1.5 tablets (25-75 mg total) by mouth at bedtime as needed for sleep.   triamcinolone cream 0.1 % Commonly known as: KENALOG Apply 1 application topically 2 (two) times daily.        All past medical history, surgical history, allergies, family history, immunizations andmedications were updated in the EMR today and reviewed under the history and medication portions of their EMR.     ROS Negative, with the exception of above mentioned in HPI   Objective:  BP (!) 174/93 (BP Location: Right Arm, Patient Position: Sitting, Cuff Size: Large)    Pulse 81    Temp 98.3 F (36.8 C) (Oral)    Ht 5\' 3"  (1.6 m)    Wt 260 lb 9.6 oz (118.2 kg)    SpO2 99%    BMI 46.16 kg/m  Body mass index is 46.16 kg/m.  Physical Exam Vitals and nursing note reviewed.  Constitutional:      General: She is not in acute distress.    Appearance: Normal appearance. She is not ill-appearing, toxic-appearing  or diaphoretic.  HENT:     Head: Normocephalic and atraumatic.     Mouth/Throat:     Mouth: Mucous membranes are moist.  Eyes:     General: No scleral icterus.       Right eye: No discharge.        Left eye: No discharge.     Extraocular Movements: Extraocular movements intact.     Conjunctiva/sclera:  Conjunctivae normal.     Pupils: Pupils are equal, round, and reactive to light.  Cardiovascular:     Rate and Rhythm: Normal rate and regular rhythm.  Pulmonary:     Effort: No respiratory distress.     Breath sounds: Normal breath sounds. No wheezing, rhonchi or rales.     Comments: Very mild increased work of breath with speaking. Chest:     Chest wall: Tenderness present.  Musculoskeletal:        General: Tenderness present. No swelling.     Right lower leg: No edema.     Left lower leg: No edema.     Comments: Mild-moderate tenderness to palpation left lateral upper trunk just above bra line.  Discomfort is easily reproduced with superficial palpation.  No skin changes or rash in this area.  Skin:    General: Skin is warm and dry.     Coloration: Skin is not jaundiced or pale.     Findings: No erythema or rash.  Neurological:     Mental Status: She is alert and oriented to person, place, and time. Mental status is at baseline.     Motor: No weakness.     Gait: Gait normal.  Psychiatric:        Mood and Affect: Mood normal.        Behavior: Behavior normal.        Thought Content: Thought content normal.        Judgment: Judgment normal.    No results found. DG Chest 2 View  Result Date: 03/19/2021 CLINICAL DATA:  Left-sided chest discomfort for several weeks, initial encounter EXAM: CHEST - 2 VIEW COMPARISON:  09/06/2020 FINDINGS: Cardiac shadow is within normal limits. The lungs are well aerated bilaterally. Mild central vascular prominence is noted without interstitial edema. No bony abnormality is seen. Postsurgical changes in the cervical spine are noted. Nerve changes of the thoracic spine are noted. IMPRESSION: Mild interstitial edema. Electronically Signed   By: Inez Catalina M.D.   On: 03/19/2021 02:38   DG Ribs Unilateral Left  Result Date: 03/19/2021 CLINICAL DATA:  Left-sided chest pain EXAM: LEFT RIBS - 2 VIEW COMPARISON:  None. FINDINGS: No fracture or other bone  lesions are seen involving the ribs. IMPRESSION: No acute rib fracture noted. Electronically Signed   By: Inez Catalina M.D.   On: 03/19/2021 02:39   No results found for this or any previous visit (from the past 24 hour(s)).  Assessment/Plan: CALEESI KOHL is a 56 y.o. female present for OV for  Suspect she has 2 separate issues occurring.  They possibly could be correlated if she is splinting enough not to take deep breaths secondary to the muscle skeletal pain. Muscle strain: She is tender to superficial touch over left teres minor and latissimus muscle groups.  She has discomfort with arm extension and abduction suggesting muscle skeletal cause. Will obtain x-ray of left lateral ribs. Robaxin 3 times daily as needed Heat application Follow-up dependent upon x-ray results, would also consider sports med or referral to physical therapy if not  seeing improvement.  Dyspnea: Oxygen saturations are normal.  Heart rate normal.  Blood pressure is elevated today, possibly secondary to pain.  She has gained weight, but does not appear fluid overloaded today. Chest x-ray and rib x-ray ordered today which showed no acute rib fractures.  Mild interstitial edema with mild central vascular prominence noted during her chest x-ray.>  Update> prescribed Lasix 20 mg daily next 3 days along with stat BNP and D-dimer.  Schedule close follow-up after stat labs received. Echocardiogram ordered ASAP May need to consider CT/CTA chest depending upon stat labs.  She is on Eliquis and denies any missed doses, therefore PE seems less likely, but cannot rule out. Patient was encouraged to seek emergent treatment if fever, shortness of breath worsens or dizziness occurs.  Reviewed expectations re: course of current medical issues. Discussed self-management of symptoms. Outlined signs and symptoms indicating need for more acute intervention. Patient verbalized understanding and all questions were answered. Patient  received an After-Visit Summary.    Orders Placed This Encounter  Procedures   DG Ribs Unilateral Left   DG Chest 2 View   Meds ordered this encounter  Medications   methocarbamol (ROBAXIN) 500 MG tablet    Sig: Take 1 tablet (500 mg total) by mouth every 8 (eight) hours as needed for muscle spasms.    Dispense:  60 tablet    Refill:  0   Referral Orders  No referral(s) requested today   > 45 Minutes was dedicated to this patient's encounter to include face-to-face time with patient, reading stat labs and x-rays, as well as prescribing medications and documentation.    Note is dictated utilizing voice recognition software. Although note has been proof read prior to signing, occasional typographical errors still can be missed. If any questions arise, please do not hesitate to call for verification.   electronically signed by:  Howard Pouch, DO  [place]

## 2021-03-19 ENCOUNTER — Other Ambulatory Visit: Payer: Self-pay | Admitting: Family Medicine

## 2021-03-19 ENCOUNTER — Ambulatory Visit (INDEPENDENT_AMBULATORY_CARE_PROVIDER_SITE_OTHER): Payer: 59

## 2021-03-19 ENCOUNTER — Telehealth: Payer: Self-pay | Admitting: Family Medicine

## 2021-03-19 ENCOUNTER — Telehealth: Payer: Self-pay

## 2021-03-19 DIAGNOSIS — R0781 Pleurodynia: Secondary | ICD-10-CM | POA: Insufficient documentation

## 2021-03-19 DIAGNOSIS — R9389 Abnormal findings on diagnostic imaging of other specified body structures: Secondary | ICD-10-CM | POA: Insufficient documentation

## 2021-03-19 DIAGNOSIS — R609 Edema, unspecified: Secondary | ICD-10-CM

## 2021-03-19 DIAGNOSIS — R0602 Shortness of breath: Secondary | ICD-10-CM

## 2021-03-19 DIAGNOSIS — T148XXA Other injury of unspecified body region, initial encounter: Secondary | ICD-10-CM | POA: Insufficient documentation

## 2021-03-19 DIAGNOSIS — R0789 Other chest pain: Secondary | ICD-10-CM | POA: Insufficient documentation

## 2021-03-19 LAB — D-DIMER, QUANTITATIVE: D-Dimer, Quant: <0.19 mcg/mL FEU (ref <0.50)

## 2021-03-19 LAB — BRAIN NATRIURETIC PEPTIDE: Brain Natriuretic Peptide: 49 pg/mL (ref <100)

## 2021-03-19 MED ORDER — FUROSEMIDE 20 MG PO TABS: 20.0000 mg | ORAL_TABLET | Freq: Every day | ORAL | 0 refills | Status: DC

## 2021-03-19 NOTE — Telephone Encounter (Signed)
Spoke with patient regarding results/recommendations.  

## 2021-03-19 NOTE — Telephone Encounter (Signed)
Pt is scheduled to have ECG done tomorrow at 4 pm. I have been asked to find out about her prior auth. If you could assist, I would greatly appreciate it.

## 2021-03-19 NOTE — Telephone Encounter (Signed)
Please inform patient her rib x-ray was normal. Her lung x-ray showed signs of "mild interstitial edema "and "mild central vascular congestion."  This simply means there is a little bit of fluid buildup in her lungs.  This can mean early signs of infection such as pneumonia.  It can also mean fluid because the heart is not functioning to full capacity and allowing fluid buildup.   I have placed a few stat orders I would like her to come in and have drawn today that will help [place] decipher if this is fluid buildup from the heart.  I have also placed an order for an echocardiogram to be completed of her heart I-as soon as possible, to ensure it is functioning normally.  She had an echocardiogram back in 2020, so she is likely familiar with the process.   I have also called in a diuretic called Lasix.  I want her to start on this immediately 1 tab daily for 3 days.  This will help relieve the fluid in the chest.  I suspect her issues are 2 separate issues since initially this was more muscle skeletal discomfort, and exam was positive for tenderness over the muscles.  However, I think her shortness of breath is secondary to a different cause.  Recommend the muscle relaxers and heat for the muscle skeletal complaints. The Lasix should help her feel like she is breathing better.  She needs to take deep breaths.  If any worsening symptoms, dizziness, fever, chills, shortness of breath she needs to be seen emergently in the ED for evaluation and emergent CT imaging.

## 2021-03-20 ENCOUNTER — Emergency Department (HOSPITAL_BASED_OUTPATIENT_CLINIC_OR_DEPARTMENT_OTHER): Payer: 59

## 2021-03-20 ENCOUNTER — Other Ambulatory Visit (HOSPITAL_BASED_OUTPATIENT_CLINIC_OR_DEPARTMENT_OTHER): Payer: 59

## 2021-03-20 ENCOUNTER — Encounter (HOSPITAL_BASED_OUTPATIENT_CLINIC_OR_DEPARTMENT_OTHER): Payer: Self-pay

## 2021-03-20 ENCOUNTER — Other Ambulatory Visit: Payer: Self-pay

## 2021-03-20 ENCOUNTER — Ambulatory Visit: Payer: 59

## 2021-03-20 ENCOUNTER — Other Ambulatory Visit: Payer: Self-pay | Admitting: Family Medicine

## 2021-03-20 ENCOUNTER — Telehealth: Payer: Self-pay | Admitting: Family Medicine

## 2021-03-20 ENCOUNTER — Emergency Department (HOSPITAL_BASED_OUTPATIENT_CLINIC_OR_DEPARTMENT_OTHER)
Admission: EM | Admit: 2021-03-20 | Discharge: 2021-03-20 | Disposition: A | Discharge status: Home / Self Care | Payer: 59 | Attending: Emergency Medicine | Admitting: Emergency Medicine

## 2021-03-20 DIAGNOSIS — Z20822 Contact with and (suspected) exposure to covid-19: Secondary | ICD-10-CM | POA: Diagnosis not present

## 2021-03-20 DIAGNOSIS — R079 Chest pain, unspecified: Secondary | ICD-10-CM | POA: Diagnosis not present

## 2021-03-20 DIAGNOSIS — R0602 Shortness of breath: Secondary | ICD-10-CM | POA: Diagnosis not present

## 2021-03-20 DIAGNOSIS — R200 Anesthesia of skin: Secondary | ICD-10-CM | POA: Diagnosis not present

## 2021-03-20 DIAGNOSIS — F339 Major depressive disorder, recurrent, unspecified: Secondary | ICD-10-CM

## 2021-03-20 LAB — BASIC METABOLIC PANEL
Anion gap: 13 (ref 5–15)
BUN: 18 mg/dL (ref 6–20)
CO2: 27 mmol/L (ref 22–32)
Calcium: 9.4 mg/dL (ref 8.9–10.3)
Chloride: 99 mmol/L (ref 98–111)
Creatinine, Ser: 0.64 mg/dL (ref 0.44–1.00)
GFR, Estimated: >60 mL/min (ref >60)
Glucose, Bld: 105 mg/dL — ABNORMAL HIGH (ref 70–99)
Potassium: 3.7 mmol/L (ref 3.5–5.1)
Sodium: 139 mmol/L (ref 135–145)

## 2021-03-20 LAB — CBC
HCT: 43.1 % (ref 36.0–46.0)
Hemoglobin: 14.8 g/dL (ref 12.0–15.0)
MCH: 30.8 pg (ref 26.0–34.0)
MCHC: 34.3 g/dL (ref 30.0–36.0)
MCV: 89.6 fL (ref 80.0–100.0)
Platelets: 291 10*3/uL (ref 150–400)
RBC: 4.81 MIL/uL (ref 3.87–5.11)
RDW: 13 % (ref 11.5–15.5)
WBC: 9.7 10*3/uL (ref 4.0–10.5)
nRBC: 0 % (ref 0.0–0.2)

## 2021-03-20 LAB — BRAIN NATRIURETIC PEPTIDE: B Natriuretic Peptide: 54.7 pg/mL (ref 0.0–100.0)

## 2021-03-20 LAB — TROPONIN I (HIGH SENSITIVITY)
Troponin I (High Sensitivity): 4 ng/L (ref <18)
Troponin I (High Sensitivity): 4 ng/L (ref <18)

## 2021-03-20 LAB — RESP PANEL BY RT-PCR (FLU A&B, COVID) ARPGX2
Influenza A by PCR: NEGATIVE
Influenza B by PCR: NEGATIVE
SARS Coronavirus 2 by RT PCR: NEGATIVE

## 2021-03-20 MED ORDER — IOHEXOL 350 MG/ML SOLN
75.0000 mL | Freq: Once | INTRAVENOUS | Status: AC | PRN
  Administered 2021-03-20: 85 mL via INTRAVENOUS

## 2021-03-20 NOTE — ED Triage Notes (Signed)
Pt c/o chest pain, R side weakness, and bilateral lower extremity swelling x "few days." Pt has seen PCP for same. Last night CP became constant and pt has associated ShOB.

## 2021-03-20 NOTE — ED Provider Notes (Signed)
[facility] DEPT Provider Note   CSN: [id number] Arrival date & time: 03/20/21  1247     History  Chief Complaint  Patient presents with   Chest Pain    [name] [name] is a 56 y.o. female.  She is here with a complaint of shortness of breath and chest pain that is been going on for 4 days.  Has had a little bit of a cough.  No fevers chills nausea vomiting.  She is also felt some numbness in her left arm along with pain going on down the left arm.  Sometimes feels some numbness on the left side of her body.  Has noticed some swelling of her legs.  He is on Eliquis for atrial fibrillation.  Has been compliant.  Non-smoker.  The history is provided by the patient.  Chest Pain Pain location:  L chest Pain quality: pressure and sharp   Pain radiates to:  L shoulder and L arm Pain severity:  Moderate Onset quality:  Gradual Duration:  4 days Timing:  Intermittent Progression:  Unchanged Chronicity:  New Relieved by:  Nothing Worsened by:  Deep breathing and movement Ineffective treatments:  Rest Associated symptoms: cough, lower extremity edema, numbness and shortness of breath   Associated symptoms: no abdominal pain, no diaphoresis, no fever, no nausea and no vomiting       Home Medications Prior to Admission medications   Medication Sig Start Date End Date Taking? Authorizing Provider  albuterol (VENTOLIN HFA) 108 (90 Base) MCG/ACT inhaler Inhale 2 puffs into the lungs every 6 (six) hours as needed for wheezing or shortness of breath. 10/01/20   Kuneff, Renee A, DO  buPROPion (WELLBUTRIN XL) 300 MG 24 hr tablet Take 1 tablet (300 mg total) by mouth daily. 10/01/20   Kuneff, Renee A, DO  cholecalciferol (VITAMIN D) 1000 units tablet Take 5,000 Units by mouth daily.     [provider]  clotrimazole (CLOTRIMAZOLE ANTI-FUNGAL) 1 % cream Apply 1 application topically 2 (two) times daily. 10/01/20   Kuneff, Renee A, DO  DULoxetine (CYMBALTA) 60 MG  capsule Take 1 capsule (60 mg total) by mouth daily. 10/01/20   Kuneff, Renee A, DO  ELIQUIS 5 MG TABS tablet TAKE 1 TABLET BY MOUTH TWICE A DAY 07/15/20   Nahser, Wonda Cheng, MD  estradiol (ESTRACE) 2 MG tablet Take 1 tablet (2 mg total) by mouth daily. 10/01/20   Kuneff, Renee A, DO  furosemide (LASIX) 20 MG tablet Take 1 tablet (20 mg total) by mouth daily. 03/19/21   Kuneff, Renee A, DO  levothyroxine (SYNTHROID) 150 MCG tablet Take 1 tablet (150 mcg total) by mouth daily. MUST HAVE OV FOR FURTHER REFILLS 02/20/21   Kuneff, Renee A, DO  liothyronine (CYTOMEL) 5 MCG tablet Take 2.5-5 mcg by mouth daily. 09/12/20   [provider]  magnesium oxide (MAG-OX) 400 MG tablet TAKE 1 TABLET BY MOUTH EVERY DAY 12/27/20   Kuneff, Renee A, DO  meloxicam (MOBIC) 7.5 MG tablet Take 1 tablet (7.5 mg total) by mouth daily. 10/01/20   Kuneff, Renee A, DO  methocarbamol (ROBAXIN) 500 MG tablet Take 1 tablet (500 mg total) by mouth every 8 (eight) hours as needed for muscle spasms. 03/18/21   Kuneff, Renee A, DO  metoprolol succinate (TOPROL-XL) 25 MG 24 hr tablet Take 0.5 tablets (12.5 mg total) by mouth daily. 10/01/20   Kuneff, Renee A, DO  Omega-3 Fatty Acids (FISH OIL PO) Take 1 capsule by mouth daily.  [provider]  Potassium Chloride ER 20 MEQ TBCR Take 20 mEq by mouth daily. 08/05/20   Nahser, Wonda Cheng, MD  progesterone (PROMETRIUM) 100 MG capsule Take 1 capsule (100 mg total) by mouth daily. 10/01/20   Kuneff, Renee A, DO  rOPINIRole (REQUIP XL) 4 MG 24 hr tablet Take 1 tablet (4 mg total) by mouth daily. 08/21/20   Suzzanne Cloud, NP  rOPINIRole (REQUIP) 2 MG tablet Take 1/2 tablet at lunch and 1/2 tablet at bed time. 08/21/20   Suzzanne Cloud, NP  telmisartan (MICARDIS) 20 MG tablet Take 1 tablet (20 mg total) by mouth daily. 10/01/20   Kuneff, Renee A, DO  traZODone (DESYREL) 50 MG tablet Take 0.5-1.5 tablets (25-75 mg total) by mouth at bedtime as needed for sleep. 10/28/20   Kuneff, Renee A, DO   triamcinolone cream (KENALOG) 0.1 % Apply 1 application topically 2 (two) times daily. 02/18/21   Kuneff, Renee A, DO      Allergies    Bee venom, Yellow jacket venom, Biaxin [clarithromycin], Gabapentin, Abilify [aripiprazole], Buspar [buspirone], Lisinopril, Maxzide [triamterene-hctz], Phentermine, and Seroquel [quetiapine fumarate]    Review of Systems   Review of Systems  Constitutional:  Negative for diaphoresis and fever.  HENT:  Negative for sore throat.   Eyes:  Negative for visual disturbance.  Respiratory:  Positive for cough and shortness of breath.   Cardiovascular:  Positive for chest pain and leg swelling.  Gastrointestinal:  Negative for abdominal pain, nausea and vomiting.  Genitourinary:  Negative for dysuria.  Musculoskeletal:  Negative for neck pain.  Skin:  Negative for rash.  Neurological:  Positive for numbness. Negative for speech difficulty.   Physical Exam Updated Vital Signs BP (!) 155/73    Pulse 67    Temp 98.2 F (36.8 C) (Oral)    Resp 18    Ht 5\' 3"  (1.6 m)    Wt 113.4 kg    SpO2 96%    BMI 44.29 kg/m  Physical Exam Vitals and nursing note reviewed.  Constitutional:      General: She is not in acute distress.    Appearance: She is well-developed.  HENT:     Head: Normocephalic and atraumatic.  Eyes:     Conjunctiva/sclera: Conjunctivae normal.  Cardiovascular:     Rate and Rhythm: Normal rate and regular rhythm.     Heart sounds: No murmur heard. Pulmonary:     Effort: Pulmonary effort is normal. No respiratory distress.     Breath sounds: Normal breath sounds.  Abdominal:     Palpations: Abdomen is soft.     Tenderness: There is no abdominal tenderness. There is no guarding or rebound.  Musculoskeletal:        General: No swelling. Normal range of motion.     Cervical back: Neck supple.     Right lower leg: No tenderness.     Left lower leg: No tenderness.  Skin:    General: Skin is warm and dry.     Capillary Refill: Capillary refill  takes less than 2 seconds.  Neurological:     General: No focal deficit present.     Mental Status: She is alert.     GCS: GCS eye subscore is 4. GCS verbal subscore is 5. GCS motor subscore is 6.     Cranial Nerves: No cranial nerve deficit.     Sensory: Sensation is intact.     Motor: Motor function is intact.     Gait:  Gait is intact.    ED Results / Procedures / Treatments   Labs (all labs ordered are listed, but only abnormal results are displayed) Labs Reviewed  BASIC METABOLIC PANEL - Abnormal; Notable for the following components:      Result Value   Glucose, Bld 105 (*)    All other components within normal limits  RESP PANEL BY RT-PCR (FLU A&B, COVID) ARPGX2  CBC  BRAIN NATRIURETIC PEPTIDE  TROPONIN I (HIGH SENSITIVITY)  TROPONIN I (HIGH SENSITIVITY)    EKG EKG Interpretation  Date/Time:  Thursday March 20 2021 12:55:38 EST Ventricular Rate:  87 PR Interval:  150 QRS Duration: 76 QT Interval:  366 QTC Calculation: 440 R Axis:   37 Text Interpretation: Normal sinus rhythm Normal ECG When compared with ECG of 11-Nov-2010 09:01, No significant change was found Confirmed by Aletta Edouard [phone]) on 03/20/2021 12:56:39 PM  Radiology CT Head Wo Contrast  Result Date: 03/20/2021 CLINICAL DATA:  Right-sided weakness. EXAM: CT HEAD WITHOUT CONTRAST TECHNIQUE: Contiguous axial images were obtained from the base of the skull through the vertex without intravenous contrast. RADIATION DOSE REDUCTION: This exam was performed according to the departmental dose-optimization program which includes automated exposure control, adjustment of the mA and/or kV according to patient size and/or use of iterative reconstruction technique. COMPARISON:  MRI brain dated August 23, 2009. FINDINGS: Brain: No evidence of acute infarction, hemorrhage, hydrocephalus, extra-axial collection or mass lesion/mass effect. Vascular: No hyperdense vessel or unexpected calcification. Skull: Normal.  Negative for fracture or focal lesion. Sinuses/Orbits: No acute finding. Other: None. IMPRESSION: 1. No acute intracranial abnormality. Electronically Signed   By: Titus Dubin M.D.   On: 03/20/2021 15:25   DG Chest Port 1 View  Result Date: 03/20/2021 CLINICAL DATA:  Shortness of breath, chest pain, right-sided weakness EXAM: PORTABLE CHEST 1 VIEW COMPARISON:  Chest radiograph 03/18/2021 FINDINGS: The heart is at the upper limits of normal for size, unchanged. The upper mediastinal contours are normal. There is vascular congestion without definite interstitial edema. There is no focal consolidation or pulmonary edema. There is no pleural effusion or pneumothorax. Overall, aeration is not significantly changed since 03/18/2021. Widening of the left [facility] joint is similar to the prior study from 2022. There is no acute osseous abnormality. IMPRESSION: No significant interval change in lung aeration since 03/18/2021. No focal consolidation or pleural effusion. Electronically Signed   By: Valetta Mole M.D.   On: 03/20/2021 13:24   CT Angio Chest Aorta W and/or Wo Contrast  Result Date: 03/20/2021 CLINICAL DATA:  Chest pain EXAM: CT ANGIOGRAPHY CHEST WITH CONTRAST TECHNIQUE: Multidetector CT imaging of the chest was performed using the standard protocol during bolus administration of intravenous contrast. Multiplanar CT image reconstructions and MIPs were obtained to evaluate the vascular anatomy. RADIATION DOSE REDUCTION: This exam was performed according to the departmental dose-optimization program which includes automated exposure control, adjustment of the mA and/or kV according to patient size and/or use of iterative reconstruction technique. CONTRAST:  43mL OMNIPAQUE IOHEXOL 350 MG/ML SOLN COMPARISON:  None. FINDINGS: Cardiovascular: Heart size upper normal. No pericardial effusion. No thoracic aortic aneurysm. No dissection of the thoracic aorta. No evidence for large central or lobar pulmonary embolus.  Mediastinum/Nodes: No mediastinal lymphadenopathy. There is no hilar lymphadenopathy. The esophagus has normal imaging features. There is no axillary lymphadenopathy. Lungs/Pleura: No suspicious pulmonary nodule or mass. No focal airspace consolidation. There is no evidence of pleural effusion. Upper Abdomen: The liver shows diffusely decreased attenuation suggesting fat deposition.  Musculoskeletal: No worrisome lytic or sclerotic osseous abnormality. Review of the MIP images confirms the above findings. IMPRESSION: 1. No evidence for thoracic aortic aneurysm. No dissection of the thoracic aorta. No CT evidence for large central or lobar pulmonary embolus. 2. Hepatic steatosis. Electronically Signed   By: Misty Stanley M.D.   On: 03/20/2021 15:38    Procedures Procedures    Medications Ordered in ED Medications  iohexol (OMNIPAQUE) 350 MG/ML injection 75 mL (85 mLs Intravenous Contrast Given 03/20/21 1506)    ED Course/ Medical Decision Making/ A&P Clinical Course as of 03/20/21 2035  Thu Mar 20, 2021  1408 Patient had a D-dimer done yesterday that was negative. [MB]    Clinical Course User Index [MB] Hayden Rasmussen, MD                           Medical Decision Making Amount and/or Complexity of Data Reviewed Labs: ordered. Radiology: ordered.  Risk Prescription drug management.  JOHNESHA ACHEAMPONG was evaluated in Emergency Department on 03/20/2021 for the symptoms described in the history of present illness. She was evaluated in the context of the global COVID-19 pandemic, which necessitated consideration that the patient might be at risk for infection with the SARS-CoV-2 virus that causes COVID-19. Institutional protocols and algorithms that pertain to the evaluation of patients at risk for COVID-19 are in a state of rapid change based on information released by regulatory bodies including the CDC and federal and state organizations. These policies and algorithms were followed during the  patient's care in the ED.  This patient complains of 4 days of chest pain shortness of breath left-sided intermittent numbness; this involves an extensive number of treatment Options and is a complaint that carries with it a high risk of complications and Morbidity. The differential includes stroke, PE, pneumothorax, vascular, musculoskeletal, reflux, ACS  I ordered, reviewed and interpreted labs, which included CBC with normal white count normal hemoglobin, chemistries normal, BMP normal, troponins flat, COVID and flu negative  I ordered imaging studies which included chest x-ray and CT CT head and angio chest and the chest x-ray does not show any acute findings.  The angio chest and head are not done and signed out to oncoming provider Dr. Langston Masker to follow-up on Additional history obtained from patient's family member Previous records obtained and reviewed in epic including prior PCP visits  After the interventions stated above, I reevaluated the patient and found patient to be hemodynamically stable satting well on room air.  She is signed out to Dr. Langston Masker to follow-up on results of imaging.  Likely can be discharged and follow-up outpatient with PCP for further work-up.          Final Clinical Impression(s) / ED Diagnoses Final diagnoses:  Nonspecific chest pain  Shortness of breath  Left sided numbness    Rx / DC Orders ED Discharge Orders     None         Hayden Rasmussen, MD 03/20/21 2037

## 2021-03-20 NOTE — Telephone Encounter (Addendum)
Spoke with pt's husband, [name] regarding results/recommendations,voiced understanding. Pt is currently in ED for chest pain. Advised I would let provider know and see if appt for Tuesday still needed. Confirmed with provider pt still needs to be scheduled for Tuesday for f/u ED. LM for return call

## 2021-03-20 NOTE — ED Notes (Signed)
Patient transported to CT 

## 2021-03-20 NOTE — ED Notes (Signed)
EMT-P provided AVS using Teachback Method. Patient verbalizes understanding of Discharge Instructions. Opportunity for Questioning and Answers were provided by EMT-P. Patient Discharged from ED.  ? ?

## 2021-03-20 NOTE — Telephone Encounter (Signed)
[facility] Day - Client TELEPHONE ADVICE RECORD [facility] Patient Name: [name] [name] HT Gender: Female DOB: 1965-04-06 Age: 56 Y 8 M 29 D Return Phone Number: [phone] (Primary) Address: City/ State/ Zip: [place]  [address] Client [facility] Day - Client Client Site [facility] - Day Provider Raoul Pitch, [place] Contact Type Call Who Is Calling Patient / Member / Family / Caregiver Call Type Triage / Clinical Relationship To Patient Self Return Phone Number [phone] (Primary) Chief Complaint CHEST PAIN - pain, pressure, heaviness or tightness Reason for Call Symptomatic / Request for Wirt states her chest is hurting and she has numbness on her left side feet swollen . Translation No Nurse Assessment Nurse: Doren Custard, RN, Caryl Pina Date/Time (Eastern Time): 03/20/2021 12:04:30 PM Confirm and document reason for call. If symptomatic, describe symptoms. ---Caller states she has chest pain, numbness on her left side, and her feet are swollen. Her chest is hurting on the middle left above the breast. Pain is 5 out of 10. Does the patient have any new or worsening symptoms? ---Yes Will a triage be completed? ---Yes Related visit to physician within the last 2 weeks? ---Yes Does the PT have any chronic conditions? (i.e. diabetes, asthma, this includes High risk factors for pregnancy, etc.) ---Yes List chronic conditions. ---HTN, afib, thyroid, restless leg syndrome Is the patient pregnant or possibly pregnant? (Ask all females between the ages of 91-55) ---No Is this a behavioral health or substance abuse call? ---No Guidelines Guideline Title Affirmed Question Affirmed Notes Nurse Date/Time (Eastern Time) Chest Pain [1] Chest pain lasts > 5 minutes AND [2] age > 47 Doren Custard, RN, Caryl Pina 03/20/2021 12:06:12 PM Disp. Time Eilene Ghazi Time) Disposition Final User 03/20/2021  12:02:10 PM Send to Urgent Consepcion Hearing 03/20/2021 12:10:24 PM 911 Outcome Documentation Doren Custard, RN, Caryl Pina PLEASE NOTE: All timestamps contained within this report are represented as [place] Standard Time. CONFIDENTIALTY NOTICE: This fax transmission is intended only for the addressee. It contains information that is legally privileged, confidential or otherwise protected from use or disclosure. If you are not the intended recipient, you are strictly prohibited from reviewing, disclosing, copying using or disseminating any of this information or taking any action in reliance on or regarding this information. If you have received this fax in error, please notify us immediately by telephone so that we can arrange for its return to [place]. Phone: [phone], Toll-Free: [phone], Fax: [phone] Page: 2 of 2 Call Id: [id number] Boon. Time Eilene Ghazi Time) Disposition Final User Reason: Caller's friend is driving her to the ED. 03/20/2021 12:09:59 PM Call EMS 911 Now Yes Doren Custard, RN, Hulan Saas Disagree/Comply Disagree Caller Understands Yes PreDisposition InappropriateToAsk Care Advice Given Per Guideline CALL EMS 911 NOW: * Immediate medical attention is needed. You need to hang up and call 911 (or an ambulance). NOTE TO TRIAGER - IF CALLER ASKS ABOUT ASPIRIN: * Available: As either 81 mg (taking two = 162 mg), 162 mg, or 325 mg tablets. * If the patient is not allergic to aspirin, they can chew an aspirin while waiting for the ambulance to arrive. CARE ADVICE given per Chest Pain (Adult) guideline. Comments User: Dorcas Carrow, RN Date/Time Eilene Ghazi Time): 03/20/2021 12:09:55 PM Caller states that if she calls 911, she will not be taken to the hospital she wants to go to. Her friend is going to drive her to the ED. Referrals [facility] - ED

## 2021-03-20 NOTE — Discharge Instructions (Addendum)
You were seen in the emergency department for evaluation of chest pain shortness of breath and intermittent left-sided numbness.  You had lab work EKG chest x-ray and a CAT scan of your head and chest that did not show an obvious explanation for your symptoms.  Please follow-up with your primary care doctor.  Return to the emergency department if any worsening or concerning symptoms  Please follow-up with your cardiologist next week as scheduled.  As explained, a single work-up in the ER does not rule out all serious heart disease.

## 2021-03-20 NOTE — Telephone Encounter (Signed)
Patient's BNP and D-dimer are both normal.  These are reassuring throughout blood clot/PE or severe CHF as cause of her symptoms and vascular congestion in her chest. Would encourage her to continue the Lasix for 3 days total.  If still feeling short of breath over the weekend she can continue Lasix once daily.  I would like to follow-up with her early next week. Please schedule her in the Tuesday 1130 slot or 4:00 slot.

## 2021-03-20 NOTE — ED Provider Notes (Signed)
I assumed care of the patient with plan to follow-up on delta troponin and CT scans.  I reviewed her imaging and interpreted them, and agree with radiologist interpretation, no acute PE, no evidence of pneumothorax or pneumonia.  CT scan of the brain does not show sign of stroke.  Patient remains asymptomatic at my reassessment, with normal vital signs.  She is in a sinus rhythm on her telemetry monitor.  She already has a cardiologist and has an appointment next week for a scheduled echocardiogram.  I recommended she follow-up with a cardiologist for this issue of chest pain.  I made it clear to her that a single negative work-up in the ER does not rule out all serious coronary and cardiac disease.  We discussed return precautions for new or worsening symptoms, including worsening chest pain, diaphoresis, lightheadedness, or other concerning symptoms.  She verbalized understanding.  Okay for discharge   Wyvonnia Dusky, MD 03/20/21 1555

## 2021-03-21 MED ORDER — TELMISARTAN 20 MG PO TABS: 20.0000 mg | ORAL_TABLET | Freq: Every day | ORAL | 0 refills | Status: DC

## 2021-03-21 NOTE — Telephone Encounter (Signed)
Pt scheduled for 03/25/21

## 2021-03-24 ENCOUNTER — Other Ambulatory Visit: Payer: Self-pay

## 2021-03-25 ENCOUNTER — Other Ambulatory Visit: Payer: Self-pay | Admitting: Family Medicine

## 2021-03-25 ENCOUNTER — Encounter: Payer: Self-pay | Admitting: Family Medicine

## 2021-03-25 ENCOUNTER — Ambulatory Visit (INDEPENDENT_AMBULATORY_CARE_PROVIDER_SITE_OTHER): Payer: 59 | Admitting: Family Medicine

## 2021-03-25 ENCOUNTER — Other Ambulatory Visit (HOSPITAL_BASED_OUTPATIENT_CLINIC_OR_DEPARTMENT_OTHER): Payer: 59

## 2021-03-25 VITALS — BP 143/79 | HR 81 | Temp 97.7°F | Ht 63.0 in | Wt 260.0 lb

## 2021-03-25 DIAGNOSIS — Z6841 Body Mass Index (BMI) 40.0 and over, adult: Secondary | ICD-10-CM

## 2021-03-25 DIAGNOSIS — G629 Polyneuropathy, unspecified: Secondary | ICD-10-CM

## 2021-03-25 DIAGNOSIS — M249 Joint derangement, unspecified: Secondary | ICD-10-CM | POA: Diagnosis not present

## 2021-03-25 DIAGNOSIS — R0789 Other chest pain: Secondary | ICD-10-CM

## 2021-03-25 DIAGNOSIS — R7309 Other abnormal glucose: Secondary | ICD-10-CM

## 2021-03-25 DIAGNOSIS — M25512 Pain in left shoulder: Secondary | ICD-10-CM

## 2021-03-25 DIAGNOSIS — T148XXA Other injury of unspecified body region, initial encounter: Secondary | ICD-10-CM

## 2021-03-25 DIAGNOSIS — M2550 Pain in unspecified joint: Secondary | ICD-10-CM

## 2021-03-25 DIAGNOSIS — E559 Vitamin D deficiency, unspecified: Secondary | ICD-10-CM

## 2021-03-25 DIAGNOSIS — F339 Major depressive disorder, recurrent, unspecified: Secondary | ICD-10-CM

## 2021-03-25 DIAGNOSIS — Z79890 Hormone replacement therapy (postmenopausal): Secondary | ICD-10-CM

## 2021-03-25 DIAGNOSIS — F5104 Psychophysiologic insomnia: Secondary | ICD-10-CM

## 2021-03-25 DIAGNOSIS — E034 Atrophy of thyroid (acquired): Secondary | ICD-10-CM

## 2021-03-25 DIAGNOSIS — E2839 Other primary ovarian failure: Secondary | ICD-10-CM

## 2021-03-25 DIAGNOSIS — R0609 Other forms of dyspnea: Secondary | ICD-10-CM

## 2021-03-25 DIAGNOSIS — I10 Essential (primary) hypertension: Secondary | ICD-10-CM

## 2021-03-25 DIAGNOSIS — I480 Paroxysmal atrial fibrillation: Secondary | ICD-10-CM

## 2021-03-25 DIAGNOSIS — D6869 Other thrombophilia: Secondary | ICD-10-CM

## 2021-03-25 DIAGNOSIS — R600 Localized edema: Secondary | ICD-10-CM

## 2021-03-25 DIAGNOSIS — E785 Hyperlipidemia, unspecified: Secondary | ICD-10-CM

## 2021-03-25 MED ORDER — METHOCARBAMOL 500 MG PO TABS: 500.0000 mg | ORAL_TABLET | Freq: Three times a day (TID) | ORAL | 0 refills | Status: DC | PRN

## 2021-03-25 MED ORDER — METOPROLOL SUCCINATE ER 25 MG PO TB24
12.5000 mg | ORAL_TABLET | Freq: Every day | ORAL | 1 refills | Status: DC
Start: 2021-03-25 — End: 2021-03-26

## 2021-03-25 MED ORDER — BUPROPION HCL ER (XL) 300 MG PO TB24: 300.0000 mg | ORAL_TABLET | Freq: Every day | ORAL | 1 refills | Status: DC

## 2021-03-25 MED ORDER — DULOXETINE HCL 60 MG PO CPEP: 60.0000 mg | ORAL_CAPSULE | Freq: Every day | ORAL | 1 refills | Status: DC

## 2021-03-25 MED ORDER — TRAZODONE HCL 50 MG PO TABS: 25.0000 mg | ORAL_TABLET | Freq: Every evening | ORAL | 1 refills | Status: DC | PRN

## 2021-03-25 MED ORDER — ESTRADIOL 2 MG PO TABS: 2.0000 mg | ORAL_TABLET | Freq: Every day | ORAL | 1 refills | Status: DC

## 2021-03-25 MED ORDER — MELOXICAM 7.5 MG PO TABS: 7.5000 mg | ORAL_TABLET | Freq: Every day | ORAL | 1 refills | Status: DC

## 2021-03-25 MED ORDER — PROGESTERONE MICRONIZED 100 MG PO CAPS: 100.0000 mg | ORAL_CAPSULE | Freq: Every day | ORAL | 1 refills | Status: DC

## 2021-03-25 MED ORDER — LEVOTHYROXINE SODIUM 150 MCG PO TABS: 150.0000 ug | ORAL_TABLET | Freq: Every day | ORAL | 3 refills | Status: DC

## 2021-03-25 MED ORDER — TELMISARTAN 20 MG PO TABS
30.0000 mg | ORAL_TABLET | Freq: Every day | ORAL | 1 refills | Status: DC
Start: 2021-03-25 — End: 2021-05-15

## 2021-03-25 NOTE — Progress Notes (Signed)
This visit occurred during the SARS-CoV-2 public health emergency.  Safety protocols were in place, including screening questions prior to the visit, additional usage of staff PPE, and extensive cleaning of exam room while observing appropriate contact time as indicated for disinfecting solutions.    [name] [name] , 1965/07/05, 56 y.o., female MRN: [id number] Patient Care Team    Relationship Specialty Notifications Start End  [name], DO PCP - General Family Medicine  07/07/16   Nahser, Wonda Cheng, MD PCP - Cardiology Cardiology Admissions 12/15/18   Ob/Gyn, [name]    07/08/16   Wilford Corner, MD Consulting Physician Gastroenterology  07/08/16   [name], MD (Inactive) Consulting Physician Neurology  07/08/16   [name], MD Attending Physician Endocrinology  07/08/16    Comment: Pt established with Si Raider, NP at this location. - Integrative med- thyroid  [name], MD Consulting Physician Rehabilitation  03/06/19    Comment: Neurosurgeon-spine and scoliosis specialist    Chief Complaint  Patient presents with   Chest Pain    ED f/u     Subjective: Pt presents for patient reports she is seeing improvement today.  Her EKG, CT and labs in the ED were normal.  She had had 2 rounds of Lasix at that time.  She reports her lower extremities were swollen when she went to the emergency room, since has greatly improved.  She is still having some mild shortness of breath, not much.  She denies any chest pain since the ED visit.  She still complains of her shoulder pain and reports without the muscle relaxer it can be significantly uncomfortable.  CT of the chest did show evidence of AC joint widening on the left.   Prior note: an OV with complaints of pain lateral trunk inferior to left axilla.  She reports she woke with the pain 1 morning about 6 weeks ago.  The area was tender to touch and she felt like there was a knot located in this area.  She thought  she pulled a muscle and was having her husband massage the area.  She denies any known injury or change in activity around that time.  She has noticed laying on her right side can make the area on the left bother her more. She states the area is still tender to touch.  She also has discomfort with arm abduction and extension on the left.  She states she has noticed some mild weakness in her left arm as well. In addition, she has noticed over the last couple weeks she has developed a mild cough that is sometimes productive with "bubbly white "phlegm.  She states she has noticed breathing is starting to become uncomfortable, especially with activity.  She denies any fevers.  She states she may have had a few chills during the last few weeks. She denies any travel. She denies missing any doses of her Eliquis. She denies any recent illness. She denies any leg swelling or tenderness. She is prescribed hormone therapy. She has no prior history of blood clots.  Depression screen [facility] 2/9 03/18/2021 10/02/2020 01/18/2020 12/05/2019 06/13/2019  Decreased Interest 0 1 0 0 0  Down, Depressed, Hopeless 0 1 0 0 0  PHQ - 2 Score 0 2 0 0 0  Altered sleeping 0 2 - - 3  Tired, decreased energy 0 1 - - 3  Change in appetite 0 1 - - 3  Feeling bad or failure about yourself  0 0 - - 0  Trouble concentrating 0 0 - - 1  Moving slowly or fidgety/restless 0 0 - - 0  Suicidal thoughts 0 0 - - 0  PHQ-9 Score 0 6 - - 10  Difficult doing work/chores Not difficult at all - - - Not difficult at all    Allergies  Allergen Reactions   Bee Venom Swelling and Anaphylaxis   Yellow Jacket Venom Swelling   Biaxin [Clarithromycin] Diarrhea and Rash   Gabapentin Nausea And Vomiting    No reaction to Horizant   Abilify [Aripiprazole] Other (See Comments)    MOOD SWING   Buspar [Buspirone] Diarrhea   Lisinopril Cough   Maxzide [Triamterene-Hctz] Rash   Phentermine Other (See Comments)    MOOD CHANGE    Seroquel [Quetiapine  Fumarate] Other (See Comments)    MOOD SWINGS   Social History   Social History Narrative   Patient is married [name])  2 children [name] and [place])   Patient is right handed.   Patient has a college education. 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    Patient drinks 1 cups daily. Uses herbal remedies, takes a daily vitamin.   Wears her seatbelt, smoke detector in the home.   Past Medical History:  Diagnosis Date   Acute bronchitis due to COVID-19 virus 09/06/2020   Anxiety 01/12/2013   Arthralgia 07/08/2016   Asthma    Atrial fibrillation ([place]) 12/06/2018   B12 deficiency 01/31/2018   Chicken pox    Chronic insomnia 11/24/2016   Clostridium difficile infection 04/02/2014, 02/06/2014   Complex tear of medial meniscus of left knee as current injury 12/01/2018   Complex tear of medial meniscus of left knee as current injury 12/01/2018   DDD (degenerative disc disease), lumbar 09/09/2018   Depression    Diabetes mellitus without complication ([place]) 26/09/3417   Dyslipidemia    Elevated hemoglobin A1c 07/08/2016   Estrogen deficiency 12/06/2018   Foraminal stenosis of lumbar region 09/09/2018   Hair loss 07/20/2017   History of lumbar fusion 09/09/2018   Hormone replacement therapy (HRT) 07/20/2017   HTN (hypertension)    Hypertension 07/08/2016   Hypokalemia 12/06/2018   Hypothyroidism    Left medial tibial plateau fracture 11/10/2018   Lumbar radiculopathy 09/09/2018   Referred to NS 09/09/2018   Migraine    Mild memory disturbance 11/13/2013   Morbid obesity ([place]) 07/08/2016   Multiple allergies    Obesity    Rash associated with COVID-19 10/02/2020   Restless legs syndrome (RLS) 01/12/2013   Follows with Dr. Jannifer Franklin who prescribes Requip and gabapentin   RLS (restless legs syndrome)    Vitamin D deficiency 12/06/2018   Wears glasses 07/08/2016   Past Surgical History:  Procedure Laterality Date   BREAST BIOPSY  6222,9798   x 2 benign lesion   CERVICAL DISCECTOMY  2003   CERVICAL FUSION  2010   x2    Hand fracture surgery     LUMBAR FUSION  2016   VAGINAL HYSTERECTOMY  2009   Family History  Problem Relation Age of Onset   Hypertension Father    Diabetes Father    Heart disease Father    Hearing loss Father    Diabetes Brother    Liver cancer Mother    COPD Mother    Mental illness Mother    Diabetes Mother    Hearing loss Mother    Heart disease Mother    Arthritis/Rheumatoid Mother    Allergies as of 03/25/2021  Reactions   Bee Venom Swelling, Anaphylaxis   Yellow Jacket Venom Swelling   Biaxin [clarithromycin] Diarrhea, Rash   Gabapentin Nausea And Vomiting   No reaction to Horizant   Abilify [aripiprazole] Other (See Comments)   MOOD SWING   Buspar [buspirone] Diarrhea   Lisinopril Cough   Maxzide [triamterene-hctz] Rash   Phentermine Other (See Comments)   MOOD CHANGE   Seroquel [quetiapine Fumarate] Other (See Comments)   MOOD SWINGS        Medication List        Accurate as of March 25, 2021 11:59 PM. If you have any questions, ask your nurse or doctor.          STOP taking these medications    furosemide 20 MG tablet Commonly known as: LASIX Stopped by: Howard Pouch, DO   liothyronine 5 MCG tablet Commonly known as: CYTOMEL Stopped by: Howard Pouch, DO       TAKE these medications    albuterol 108 (90 Base) MCG/ACT inhaler Commonly known as: VENTOLIN HFA Inhale 2 puffs into the lungs every 6 (six) hours as needed for wheezing or shortness of breath.   buPROPion 300 MG 24 hr tablet Commonly known as: WELLBUTRIN XL Take 1 tablet (300 mg total) by mouth daily.   cholecalciferol 1000 units tablet Commonly known as: VITAMIN D Take 5,000 Units by mouth daily.   clotrimazole 1 % cream Commonly known as: Clotrimazole Anti-Fungal Apply 1 application topically 2 (two) times daily.   DULoxetine 60 MG capsule Commonly known as: CYMBALTA Take 1 capsule (60 mg total) by mouth daily. What changed: how much to take Changed by:  Howard Pouch, DO   Eliquis 5 MG Tabs tablet Generic drug: apixaban TAKE 1 TABLET BY MOUTH TWICE A DAY   estradiol 2 MG tablet Commonly known as: ESTRACE Take 1 tablet (2 mg total) by mouth daily.   FISH OIL PO Take 1 capsule by mouth daily.   gabapentin 300 MG capsule Commonly known as: NEURONTIN Take 300 mg by mouth 2 (two) times daily.   levothyroxine 150 MCG tablet Commonly known as: SYNTHROID Take 1 tablet (150 mcg total) by mouth daily. What changed: additional instructions Changed by: Howard Pouch, DO   magnesium oxide 400 MG tablet Commonly known as: MAG-OX TAKE 1 TABLET BY MOUTH EVERY DAY   meloxicam 7.5 MG tablet Commonly known as: MOBIC Take 1 tablet (7.5 mg total) by mouth daily.   methocarbamol 500 MG tablet Commonly known as: Robaxin Take 1 tablet (500 mg total) by mouth every 8 (eight) hours as needed for muscle spasms.   metoprolol succinate 25 MG 24 hr tablet Commonly known as: TOPROL-XL Take 0.5 tablets (12.5 mg total) by mouth daily.   Potassium Chloride ER 20 MEQ Tbcr Take 20 mEq by mouth daily.   progesterone 100 MG capsule Commonly known as: PROMETRIUM Take 1 capsule (100 mg total) by mouth daily.   rOPINIRole 2 MG tablet Commonly known as: REQUIP Take 1/2 tablet at lunch and 1/2 tablet at bed time.   rOPINIRole 4 MG 24 hr tablet Commonly known as: REQUIP XL Take 1 tablet (4 mg total) by mouth daily.   telmisartan 20 MG tablet Commonly known as: Micardis Take 1.5 tablets (30 mg total) by mouth daily. What changed: how much to take Changed by: Howard Pouch, DO   traZODone 50 MG tablet Commonly known as: DESYREL Take 0.5-1.5 tablets (25-75 mg total) by mouth at bedtime as needed for sleep.   triamcinolone  cream 0.1 % Commonly known as: KENALOG Apply 1 application topically 2 (two) times daily.        All past medical history, surgical history, allergies, family history, immunizations andmedications were updated in the EMR  today and reviewed under the history and medication portions of their EMR.     ROS Negative, with the exception of above mentioned in HPI   Objective:  BP (!) 143/79    Pulse 81    Temp 97.7 F (36.5 C) (Oral)    Ht 5\' 3"  (1.6 m)    Wt 260 lb (117.9 kg)    SpO2 97%    BMI 46.06 kg/m  Body mass index is 46.06 kg/m. Physical Exam Vitals and nursing note reviewed.  Constitutional:      General: She is not in acute distress.    Appearance: Normal appearance. She is well-developed. She is obese. She is not ill-appearing, toxic-appearing or diaphoretic.  HENT:     Head: Normocephalic and atraumatic.     Mouth/Throat:     Mouth: Mucous membranes are moist.  Eyes:     General: No scleral icterus.       Right eye: No discharge.        Left eye: No discharge.     Extraocular Movements: Extraocular movements intact.     Conjunctiva/sclera: Conjunctivae normal.     Pupils: Pupils are equal, round, and reactive to light.  Neck:     Thyroid: No thyromegaly.  Cardiovascular:     Rate and Rhythm: Normal rate and regular rhythm.     Heart sounds: Normal heart sounds. No murmur heard. Pulmonary:     Effort: No respiratory distress.     Breath sounds: Normal breath sounds. No wheezing, rhonchi or rales.  Chest:     Chest wall: No tenderness.  Musculoskeletal:        General: Tenderness present. No swelling.     Cervical back: Neck supple.     Right lower leg: No edema.     Left lower leg: Edema present.  Skin:    General: Skin is warm and dry.     Coloration: Skin is not jaundiced or pale.     Findings: No erythema or rash.  Neurological:     Mental Status: She is alert and oriented to person, place, and time. Mental status is at baseline.     Motor: No weakness.     Gait: Gait normal.  Psychiatric:        Mood and Affect: Mood normal.        Behavior: Behavior normal.        Thought Content: Thought content normal.        Judgment: Judgment normal.    No results  found.  Assessment/Plan: KAMI KUBE is a 56 y.o. female present for OV for  Shoulder pain/Muscle strain/AC joint: Continue heat.   Continue Robaxin. Continue Mobic Sports med referral placed for her today  Dyspnea/chest pain/vascular congestion/lower extremity edema: Continue Lasix 20 mg daily.  Awaiting echo results to further advise on plan. Would have her follow back up with her cardiology and referral has been placed today. Increase Diovan to 30 mg daily.  Hypothyroidism/hair loss/HRT Stable  Continue levothyroxine 150 mcg QD.  -Continue Cytomel 2.5 to 5 mcg ? May refill if requested -Continue estrogen and progesterone -Mammogram 12/26/2019.  Patient aware she needs yearly mammogram as long as on hormone therapy.  She reports understanding. -Follow-up 5.5 months   Recurrent major  depressive disorder, remission status unspecified (HCC) Stable  Continue Cymbalta 60 mg daily  Continue Wellbutrin 300 mg daily  Follow-up 5.5 months, sooner if needed    Arthralgia, unspecified joint Stable. Discussed risks of increased bleeding with use of Mobic and Eliquis.  She wants to continue the lower dose Mobic, and has been stable without any bleeding noted. -Iron panel has been stable  -Requip and horizontal are prescribed by her neurology team. - Continue Cymbalta -Follow-up 5.5 months   Vitamin D deficiency: -Patient is taking 5000 units daily.   -Continue current supplementation   Essential hypertension/morbid obesity/PAF/BMI 45.0-49.9, adult ([place]) Remains elevated. Increase Micardis 30 mg  Continue metoprolol 12.5 mg daily Continue Eliquis prescribed by cardiology Continue  to follow with cardiology Low sodium.  Follow-up 5.5 months  Chronic insomnia Stable. Continue trazodone 25 to 75 mg nightly    Reviewed expectations re: course of current medical issues. Discussed self-management of symptoms. Outlined signs and symptoms indicating need for more acute  intervention. Patient verbalized understanding and all questions were answered. Patient received an After-Visit Summary.    Orders Placed This Encounter  Procedures   Ambulatory referral to Sports Medicine   Ambulatory referral to Cardiology   Meds ordered this encounter  Medications   telmisartan (MICARDIS) 20 MG tablet    Sig: Take 1.5 tablets (30 mg total) by mouth daily.    Dispense:  135 tablet    Refill:  1   methocarbamol (ROBAXIN) 500 MG tablet    Sig: Take 1 tablet (500 mg total) by mouth every 8 (eight) hours as needed for muscle spasms.    Dispense:  60 tablet    Refill:  0   DISCONTD: buPROPion (WELLBUTRIN XL) 300 MG 24 hr tablet    Sig: Take 1 tablet (300 mg total) by mouth daily.    Dispense:  90 tablet    Refill:  1   DISCONTD: DULoxetine (CYMBALTA) 60 MG capsule    Sig: Take 1 capsule (60 mg total) by mouth daily.    Dispense:  90 capsule    Refill:  1   DISCONTD: estradiol (ESTRACE) 2 MG tablet    Sig: Take 1 tablet (2 mg total) by mouth daily.    Dispense:  90 tablet    Refill:  1   levothyroxine (SYNTHROID) 150 MCG tablet    Sig: Take 1 tablet (150 mcg total) by mouth daily.    Dispense:  90 tablet    Refill:  3    Generic preferred   DISCONTD: meloxicam (MOBIC) 7.5 MG tablet    Sig: Take 1 tablet (7.5 mg total) by mouth daily.    Dispense:  90 tablet    Refill:  1   DISCONTD: metoprolol succinate (TOPROL-XL) 25 MG 24 hr tablet    Sig: Take 0.5 tablets (12.5 mg total) by mouth daily.    Dispense:  45 tablet    Refill:  1   DISCONTD: progesterone (PROMETRIUM) 100 MG capsule    Sig: Take 1 capsule (100 mg total) by mouth daily.    Dispense:  90 capsule    Refill:  1   traZODone (DESYREL) 50 MG tablet    Sig: Take 0.5-1.5 tablets (25-75 mg total) by mouth at bedtime as needed for sleep.    Dispense:  90 tablet    Refill:  1   albuterol (VENTOLIN HFA) 108 (90 Base) MCG/ACT inhaler    Sig: Inhale 2 puffs into the lungs every 6 (six) hours as  needed  for wheezing or shortness of breath.    Dispense:  6.7 each    Refill:  1   buPROPion (WELLBUTRIN XL) 300 MG 24 hr tablet    Sig: Take 1 tablet (300 mg total) by mouth daily.    Dispense:  90 tablet    Refill:  1   DULoxetine (CYMBALTA) 60 MG capsule    Sig: Take 1 capsule (60 mg total) by mouth daily.    Dispense:  90 capsule    Refill:  1   estradiol (ESTRACE) 2 MG tablet    Sig: Take 1 tablet (2 mg total) by mouth daily.    Dispense:  90 tablet    Refill:  1   meloxicam (MOBIC) 7.5 MG tablet    Sig: Take 1 tablet (7.5 mg total) by mouth daily.    Dispense:  90 tablet    Refill:  1   metoprolol succinate (TOPROL-XL) 25 MG 24 hr tablet    Sig: Take 0.5 tablets (12.5 mg total) by mouth daily.    Dispense:  45 tablet    Refill:  1   progesterone (PROMETRIUM) 100 MG capsule    Sig: Take 1 capsule (100 mg total) by mouth daily.    Dispense:  90 capsule    Refill:  1   Referral Orders         Ambulatory referral to Sports Medicine         Ambulatory referral to Cardiology     > 45 Minutes was dedicated to this patient's encounter to include face-to-face time with patient, reading stat labs and x-rays, as well as prescribing medications and documentation.    Note is dictated utilizing voice recognition software. Although note has been proof read prior to signing, occasional typographical errors still can be missed. If any questions arise, please do not hesitate to call for verification.   electronically signed by:  Howard Pouch, DO  [place]

## 2021-03-25 NOTE — Patient Instructions (Signed)
° °  Increase micardis to 2.5 tabs daily.  Sports med referral placed today  We will call you echo result once we receive and guide you further.   I ma glad you are feeling better

## 2021-03-26 ENCOUNTER — Other Ambulatory Visit: Payer: Self-pay

## 2021-03-26 ENCOUNTER — Ambulatory Visit (INDEPENDENT_AMBULATORY_CARE_PROVIDER_SITE_OTHER): Payer: 59

## 2021-03-26 ENCOUNTER — Encounter: Payer: Self-pay | Admitting: Family Medicine

## 2021-03-26 DIAGNOSIS — M65812 Other synovitis and tenosynovitis, left shoulder: Secondary | ICD-10-CM | POA: Insufficient documentation

## 2021-03-26 DIAGNOSIS — M249 Joint derangement, unspecified: Secondary | ICD-10-CM | POA: Insufficient documentation

## 2021-03-26 DIAGNOSIS — Z6841 Body Mass Index (BMI) 40.0 and over, adult: Secondary | ICD-10-CM | POA: Insufficient documentation

## 2021-03-26 DIAGNOSIS — R609 Edema, unspecified: Secondary | ICD-10-CM

## 2021-03-26 DIAGNOSIS — R600 Localized edema: Secondary | ICD-10-CM

## 2021-03-26 DIAGNOSIS — M25412 Effusion, left shoulder: Secondary | ICD-10-CM | POA: Insufficient documentation

## 2021-03-26 HISTORY — DX: Joint derangement, unspecified: M24.9

## 2021-03-26 LAB — ECHOCARDIOGRAM COMPLETE
AR max vel: 1.87 cm2
AV Area VTI: 1.94 cm2
AV Area mean vel: 1.67 cm2
AV Mean grad: 6 mmHg
AV Peak grad: 12.5 mmHg
Ao pk vel: 1.77 m/s
Area-P 1/2: 3.27 cm2
S' Lateral: 3.5 cm

## 2021-03-26 MED ORDER — DULOXETINE HCL 60 MG PO CPEP: 60.0000 mg | ORAL_CAPSULE | Freq: Every day | ORAL | 1 refills | Status: DC

## 2021-03-26 MED ORDER — BUPROPION HCL ER (XL) 300 MG PO TB24: 300.0000 mg | ORAL_TABLET | Freq: Every day | ORAL | 1 refills | Status: DC

## 2021-03-26 MED ORDER — PROGESTERONE MICRONIZED 100 MG PO CAPS: 100.0000 mg | ORAL_CAPSULE | Freq: Every day | ORAL | 1 refills | Status: DC

## 2021-03-26 MED ORDER — MELOXICAM 7.5 MG PO TABS: 7.5000 mg | ORAL_TABLET | Freq: Every day | ORAL | 1 refills | Status: DC

## 2021-03-26 MED ORDER — ALBUTEROL SULFATE HFA 108 (90 BASE) MCG/ACT IN AERS: 2.0000 | INHALATION_SPRAY | Freq: Four times a day (QID) | RESPIRATORY_TRACT | 1 refills | Status: DC | PRN

## 2021-03-26 MED ORDER — METOPROLOL SUCCINATE ER 25 MG PO TB24: 12.5000 mg | ORAL_TABLET | Freq: Every day | ORAL | 1 refills | Status: DC

## 2021-03-26 MED ORDER — ESTRADIOL 2 MG PO TABS: 2.0000 mg | ORAL_TABLET | Freq: Every day | ORAL | 1 refills | Status: DC

## 2021-03-27 ENCOUNTER — Telehealth: Payer: Self-pay | Admitting: Family Medicine

## 2021-03-27 DIAGNOSIS — R0789 Other chest pain: Secondary | ICD-10-CM

## 2021-03-27 MED ORDER — FUROSEMIDE 20 MG PO TABS: 20.0000 mg | ORAL_TABLET | Freq: Every day | ORAL | 1 refills | Status: DC

## 2021-03-27 NOTE — Telephone Encounter (Signed)
Please call patient: The pumping action and relaxing action of her heart are both normal. The aortic valve, which is the main vessel that comes off the heart to the body has a mild calcification and thickening to it. The pressures of the right side of her heart are just mildly elevated in comparison to normal> this is consistent with her swelling in her legs that day.   Recommendations: 1.  I have placed a referral back to her cardiologist for further evaluation to ensure nothing is missed since this fluid collection and shortness of breath came on suddenly and was new for her. 2.  Instead of stopping the Lasix this weekend, I believe she should continue the Lasix 20 mg daily and the potassium supplement.  And today I would actually encourage her to take 2 of the Lasix and 2 of the potassium. 3.  Be sure to start the telmisartan increased dose of 1-1/2 tabs for better blood pressure control. 4.  Heart healthy diet low in sodium is encouraged.   Follow-up with this provider in 1 month for recheck, sooner if worsening

## 2021-03-27 NOTE — Telephone Encounter (Signed)
Spoke with patient regarding results/recommendations.  MyChart message sent with results.

## 2021-04-01 ENCOUNTER — Other Ambulatory Visit: Payer: Self-pay | Admitting: Family Medicine

## 2021-04-01 ENCOUNTER — Encounter: Payer: Self-pay | Admitting: Family Medicine

## 2021-04-01 ENCOUNTER — Ambulatory Visit: Payer: Self-pay

## 2021-04-01 ENCOUNTER — Ambulatory Visit (INDEPENDENT_AMBULATORY_CARE_PROVIDER_SITE_OTHER): Payer: 59 | Admitting: Family Medicine

## 2021-04-01 VITALS — BP 150/84 | Ht 64.0 in | Wt 260.0 lb

## 2021-04-01 DIAGNOSIS — M65812 Other synovitis and tenosynovitis, left shoulder: Secondary | ICD-10-CM

## 2021-04-01 DIAGNOSIS — M899 Disorder of bone, unspecified: Secondary | ICD-10-CM

## 2021-04-01 DIAGNOSIS — M25512 Pain in left shoulder: Secondary | ICD-10-CM

## 2021-04-01 MED ORDER — PREDNISONE 20 MG PO TABS: ORAL_TABLET | ORAL | 0 refills | Status: DC

## 2021-04-01 NOTE — Progress Notes (Signed)
[name] [name] - 56 y.o. female MRN [id number]  Date of birth: Jul 22, 1965  SUBJECTIVE:  Including CC & ROS.  No chief complaint on file.   [name] [name] is a 56 y.o. female that is presenting with acute left shoulder pain and left upper back pain.  The pain is worse when she lies on the right side.  The pain can be severe in nature.  Denies any specific inciting event.  Independent review of the left rib x-ray from 1/17 is showing widening of the [facility] joint. Review of the CT angio chest from 1/19 shows no sclerotic osseous changes.  Review of Systems See HPI   HISTORY: Past Medical, Surgical, Social, and Family History Reviewed & Updated per EMR.   Pertinent Historical Findings include:  Past Medical History:  Diagnosis Date   Acute bronchitis due to COVID-19 virus 09/06/2020   Anxiety 01/12/2013   Arthralgia 07/08/2016   Asthma    Atrial fibrillation ([place]) 12/06/2018   B12 deficiency 01/31/2018   Chicken pox    Chronic insomnia 11/24/2016   Clostridium difficile infection 04/02/2014, 02/06/2014   Complex tear of medial meniscus of left knee as current injury 12/01/2018   Complex tear of medial meniscus of left knee as current injury 12/01/2018   DDD (degenerative disc disease), lumbar 09/09/2018   Depression    Diabetes mellitus without complication ([place]) 04/06/971   Dyslipidemia    Elevated hemoglobin A1c 07/08/2016   Estrogen deficiency 12/06/2018   Foraminal stenosis of lumbar region 09/09/2018   Hair loss 07/20/2017   History of lumbar fusion 09/09/2018   Hormone replacement therapy (HRT) 07/20/2017   HTN (hypertension)    Hypertension 07/08/2016   Hypokalemia 12/06/2018   Hypothyroidism    Left medial tibial plateau fracture 11/10/2018   Lumbar radiculopathy 09/09/2018   Referred to NS 09/09/2018   Migraine    Mild memory disturbance 11/13/2013   Morbid obesity ([place]) 07/08/2016   Multiple allergies    Obesity    Rash associated with COVID-19 10/02/2020   Restless legs syndrome (RLS)  01/12/2013   Follows with Dr. Jannifer Franklin who prescribes Requip and gabapentin   RLS (restless legs syndrome)    Vitamin D deficiency 12/06/2018   Wears glasses 07/08/2016    Past Surgical History:  Procedure Laterality Date   BREAST BIOPSY  5329,9242   x 2 benign lesion   CERVICAL DISCECTOMY  2003   CERVICAL FUSION  2010   x2   Hand fracture surgery     LUMBAR FUSION  2016   VAGINAL HYSTERECTOMY  2009     PHYSICAL EXAM:  VS: BP (!) 150/84 (BP Location: Right Arm, Patient Position: Sitting)    Ht 5\' 4"  (1.626 m)    Wt 260 lb (117.9 kg)    BMI 44.63 kg/m  Physical Exam Gen: NAD, alert, cooperative with exam, well-appearing MSK:  Neurovascularly intact    Limited ultrasound: Left shoulder:  Widening of the [facility] joint appreciated with no effusion or hyperemia. Biceps tendon sheath shows an effusion. There is no overlying effusion of the subscapularis and at the insertion of the biceps tendon. Minimal subacromial bursitis. Mild effusion posterior glenohumeral joint.  Summary: Effusion appreciated in the shoulder joint.  Ultrasound and interpretation by Clearance Coots, MD    ASSESSMENT & PLAN:   Scapular dysfunction Acutely occurring.  Having pain over the medial border of the scapula.  May be more associated with the findings at the shoulder versus a radicular type pain. -Counseled on home  exercise therapy and supportive care. -Could consider trigger point injections or physical therapy.  Synovitis of left shoulder Acutely occurring.  Has widening of the [facility] joint but no changes on ultrasound.  Has a questionable history of inflammatory origin.  Does have fluid around the shoulder. -Counseled on home exercise therapy and supportive care. -Uric acid, ANA panel, sed rate and CRP. -Prednisone. -Could consider injection versus evaluation for radicular pain.

## 2021-04-01 NOTE — Patient Instructions (Signed)
Good to see you Please try heat on the back  Please try the exercises  I will call with the results from today   Please send me a message in MyChart with any questions or updates.  Please see me back in 2 weeks.   --Dr. Raeford Razor

## 2021-04-02 ENCOUNTER — Encounter: Payer: Self-pay | Admitting: Family Medicine

## 2021-04-02 DIAGNOSIS — M899 Disorder of bone, unspecified: Secondary | ICD-10-CM | POA: Insufficient documentation

## 2021-04-02 HISTORY — DX: Disorder of bone, unspecified: M89.9

## 2021-04-02 NOTE — Assessment & Plan Note (Signed)
Acutely occurring.  Having pain over the medial border of the scapula.  May be more associated with the findings at the shoulder versus a radicular type pain. -Counseled on home exercise therapy and supportive care. -Could consider trigger point injections or physical therapy.

## 2021-04-02 NOTE — Assessment & Plan Note (Signed)
Acutely occurring.  Has widening of the [facility] joint but no changes on ultrasound.  Has a questionable history of inflammatory origin.  Does have fluid around the shoulder. -Counseled on home exercise therapy and supportive care. -Uric acid, ANA panel, sed rate and CRP. -Prednisone. -Could consider injection versus evaluation for radicular pain.

## 2021-04-07 LAB — C-REACTIVE PROTEIN: CRP: 7 mg/L (ref 0–10)

## 2021-04-07 LAB — ANA,IFA RA DIAG PNL W/RFLX TIT/PATN
ANA Titer 1: NEGATIVE
Cyclic Citrullin Peptide Ab: <1 units (ref 0–19)
Rheumatoid fact SerPl-aCnc: 10.4 IU/mL (ref <14.0)

## 2021-04-07 LAB — URIC ACID: Uric Acid: 6.2 mg/dL (ref 3.0–7.2)

## 2021-04-07 LAB — SEDIMENTATION RATE: Sed Rate: 12 mm/hr (ref 0–40)

## 2021-04-08 ENCOUNTER — Encounter: Payer: Self-pay | Admitting: Family Medicine

## 2021-04-08 ENCOUNTER — Telehealth: Payer: Self-pay | Admitting: Family Medicine

## 2021-04-08 NOTE — Telephone Encounter (Signed)
Informed of results.   Rosemarie Ax, MD [facility] 04/08/2021, 9:01 AM

## 2021-04-09 ENCOUNTER — Other Ambulatory Visit: Payer: Self-pay | Admitting: Family Medicine

## 2021-04-11 ENCOUNTER — Encounter: Payer: Self-pay | Admitting: Family Medicine

## 2021-04-12 ENCOUNTER — Other Ambulatory Visit: Payer: Self-pay | Admitting: Family Medicine

## 2021-04-14 ENCOUNTER — Encounter: Payer: Self-pay | Admitting: Family Medicine

## 2021-04-14 ENCOUNTER — Other Ambulatory Visit: Payer: Self-pay | Admitting: Family Medicine

## 2021-04-14 NOTE — Telephone Encounter (Signed)
Please advise if this is ok.  

## 2021-04-14 NOTE — Telephone Encounter (Signed)
yes

## 2021-04-15 ENCOUNTER — Encounter: Payer: Self-pay | Admitting: Family Medicine

## 2021-04-15 ENCOUNTER — Ambulatory Visit (INDEPENDENT_AMBULATORY_CARE_PROVIDER_SITE_OTHER): Payer: 59 | Admitting: Family Medicine

## 2021-04-15 VITALS — BP 160/84 | Ht 64.0 in | Wt 260.0 lb

## 2021-04-15 DIAGNOSIS — M8438XA Stress fracture, other site, initial encounter for fracture: Secondary | ICD-10-CM | POA: Diagnosis not present

## 2021-04-15 HISTORY — DX: Stress fracture, other site, initial encounter for fracture: M84.38XA

## 2021-04-15 NOTE — Progress Notes (Signed)
°  [name] [name] - 56 y.o. female MRN [id number]  Date of birth: [birth year]  SUBJECTIVE:  Including CC & ROS.  No chief complaint on file.   [name] [name] is a 56 y.o. female that is  presenting worsening mid axillary rib pain. Pain has been present for 2 months. She has tried medications. Pain is worse with movements and laying on the contralateral side. No injury. No history of surgery.   Review of Systems See HPI   HISTORY: Past Medical, Surgical, Social, and Family History Reviewed & Updated per EMR.   Pertinent Historical Findings include:  Past Medical History:  Diagnosis Date   Acute bronchitis due to COVID-19 virus 09/06/2020   Anxiety 01/12/2013   Arthralgia 07/08/2016   Asthma    Atrial fibrillation ([place]) 12/06/2018   B12 deficiency 01/31/2018   Chicken pox    Chronic insomnia 11/24/2016   Clostridium difficile infection 04/02/2014, 02/06/2014   Complex tear of medial meniscus of left knee as current injury 12/01/2018   Complex tear of medial meniscus of left knee as current injury 12/01/2018   DDD (degenerative disc disease), lumbar 09/09/2018   Depression    Diabetes mellitus without complication ([place]) 50/06/6977   Dyslipidemia    Elevated hemoglobin A1c 07/08/2016   Estrogen deficiency 12/06/2018   Foraminal stenosis of lumbar region 09/09/2018   Hair loss 07/20/2017   History of lumbar fusion 09/09/2018   Hormone replacement therapy (HRT) 07/20/2017   HTN (hypertension)    Hypertension 07/08/2016   Hypokalemia 12/06/2018   Hypothyroidism    Left medial tibial plateau fracture 11/10/2018   Lumbar radiculopathy 09/09/2018   Referred to NS 09/09/2018   Migraine    Mild memory disturbance 11/13/2013   Morbid obesity ([place]) 07/08/2016   Multiple allergies    Obesity    Rash associated with COVID-19 10/02/2020   Restless legs syndrome (RLS) 01/12/2013   Follows with Dr. Jannifer Franklin who prescribes Requip and gabapentin   RLS (restless legs syndrome)    Vitamin D deficiency 12/06/2018   Wears  glasses 07/08/2016    Past Surgical History:  Procedure Laterality Date   BREAST BIOPSY  4801,6553   x 2 benign lesion   CERVICAL DISCECTOMY  2003   CERVICAL FUSION  2010   x2   Hand fracture surgery     LUMBAR FUSION  2016   VAGINAL HYSTERECTOMY  2009     PHYSICAL EXAM:  VS: BP (!) 160/84 (BP Location: Left Arm, Patient Position: Sitting)    Ht 5\' 4"  (1.626 m)    Wt 260 lb (117.9 kg)    BMI 44.63 kg/m  Physical Exam Gen: NAD, alert, cooperative with exam, well-appearing MSK:  Chest: No bruising. Tenderness to palpation along the 7th-9th rib space in the mid axillary line. Neurovascularly intact       ASSESSMENT & PLAN:   Stress fracture of rib Acutely occurring. The mid axillary space is still having ongoing pain. Pain concerning for stress fracture. Chest and rib xray has been normal.  -Counseled on home exercise therapy and supportive care. -MRI of the chest to evaluate for stress fracture of the mid axillary ribs versus myositis

## 2021-04-15 NOTE — Assessment & Plan Note (Signed)
Acutely occurring. The mid axillary space is still having ongoing pain. Pain concerning for stress fracture. Chest and rib xray has been normal.  -Counseled on home exercise therapy and supportive care. -MRI of the chest to evaluate for stress fracture of the mid axillary ribs versus myositis

## 2021-04-15 NOTE — Patient Instructions (Signed)
Good to see you I'll check with the radiologist to check the most appropriate MRI  The number is [phone] is the number to call to schedule the MRI   Please send me a message in [place] with any questions or updates.  We'll setup a virtual visit once the MRi is resulted.   --Dr. Raeford Razor

## 2021-04-17 ENCOUNTER — Other Ambulatory Visit: Payer: Self-pay | Admitting: Family Medicine

## 2021-04-21 DIAGNOSIS — J45909 Unspecified asthma, uncomplicated: Secondary | ICD-10-CM | POA: Insufficient documentation

## 2021-04-21 DIAGNOSIS — B019 Varicella without complication: Secondary | ICD-10-CM | POA: Insufficient documentation

## 2021-04-22 ENCOUNTER — Other Ambulatory Visit: Payer: Self-pay | Admitting: Family Medicine

## 2021-04-24 ENCOUNTER — Encounter: Payer: Self-pay | Admitting: Cardiology

## 2021-04-24 ENCOUNTER — Other Ambulatory Visit: Payer: Self-pay

## 2021-04-24 ENCOUNTER — Ambulatory Visit (INDEPENDENT_AMBULATORY_CARE_PROVIDER_SITE_OTHER): Payer: 59 | Admitting: Cardiology

## 2021-04-24 VITALS — BP 178/92 | HR 98 | Ht 63.0 in | Wt 262.0 lb

## 2021-04-24 DIAGNOSIS — D6869 Other thrombophilia: Secondary | ICD-10-CM | POA: Diagnosis not present

## 2021-04-24 DIAGNOSIS — I10 Essential (primary) hypertension: Secondary | ICD-10-CM | POA: Diagnosis not present

## 2021-04-24 DIAGNOSIS — E785 Hyperlipidemia, unspecified: Secondary | ICD-10-CM

## 2021-04-24 DIAGNOSIS — E034 Atrophy of thyroid (acquired): Secondary | ICD-10-CM

## 2021-04-24 DIAGNOSIS — Z6841 Body Mass Index (BMI) 40.0 and over, adult: Secondary | ICD-10-CM

## 2021-04-24 DIAGNOSIS — I480 Paroxysmal atrial fibrillation: Secondary | ICD-10-CM | POA: Diagnosis not present

## 2021-04-24 NOTE — Patient Instructions (Signed)
Medication Instructions:  Your physician recommends that you continue on your current medications as directed. Please refer to the Current Medication list given to you today.  *If you need a refill on your cardiac medications before your next appointment, please call your pharmacy*   Lab Work: Your physician recommends that you return for lab work in:   Labs today: BMP, Lipids  If you have labs (blood work) drawn today and your tests are completely normal, you will receive your results only by: [place] (if you have [place]) OR A paper copy in the mail If you have any lab test that is abnormal or we need to change your treatment, we will call you to review the results.   Testing/Procedures: None   Follow-Up: At [facility], you and your health needs are our priority.  As part of our continuing mission to provide you with exceptional heart care, we have created designated Provider Care Teams.  These Care Teams include your primary Cardiologist (physician) and Advanced Practice Providers (APPs -  Physician Assistants and Nurse Practitioners) who all work together to provide you with the care you need, when you need it.  We recommend signing up for the patient portal called "MyChart".  Sign up information is provided on this After Visit Summary.  MyChart is used to connect with patients for Virtual Visits (Telemedicine).  Patients are able to view lab/test results, encounter notes, upcoming appointments, etc.  Non-urgent messages can be sent to your provider as well.   To learn more about what you can do with MyChart, go to [email or web address].    Your next appointment:   3 month(s)  The format for your next appointment:   In Person  Provider:   Jenne Campus, MD    Other Instructions Ambulatory Referral for Home Sleep Study

## 2021-04-24 NOTE — Progress Notes (Signed)
Cardiology Office Note:    Date:  04/24/2021   ID:  [name] [name], DOB 1965-11-18, MRN [id number]  PCP:  [name], DO  Cardiologist:  Jenne Campus, MD    Referring MD: [name], DO   No chief complaint on file. I am here to follow-up  History of Present Illness:    [name] [name] is a 56 y.o. female with past medical history significant for paroxysmal atrial fibrillation she had 1 documented episode of atrial fibrillation, she is being anticoagulated since that time.  Her CHADS2 Vascor is 3 which and is related to high blood pressure, however being a woman, congestive heart failure, there was also some issue with potentially having TIA I think we have enough indication to continue anticoagulation.  Recently she had of having some allergic reaction to medication would lead to significant swelling of lower extremities.  She was given prednisone which make her blood pressure go high and swelling of lower extremities.  She also complained of having shortness of breath.  Walking do moving around to give her short of breath quite easily.  Evaluation for this problem included echocardiogram which showed preserved left ventricle ejection fraction with normal diastolic function, proBNP was normal.  She denies have any chest pain tightness squeezing pressure burning chest swelling of lower EXTR  The meantime she takes Lasix on as-needed basis for it.  She had difficulty sleeping because of chronic back problem.  She snores a lot she tells me that her husband does not want to sleep with her because of it.  Past Medical History:  Diagnosis Date   Acute bronchitis due to COVID-19 virus 09/06/2020   Anxiety 01/12/2013   Arthralgia 07/08/2016   Asthma    Atrial fibrillation ([place]) 12/06/2018   B12 deficiency 01/31/2018   Chicken pox    Chronic insomnia 11/24/2016   Clostridium difficile infection 04/02/2014, 02/06/2014   Complex tear of medial meniscus of left knee as current injury  12/01/2018   Complex tear of medial meniscus of left knee as current injury 12/01/2018   DDD (degenerative disc disease), lumbar 09/09/2018   Depression    Diabetes mellitus without complication ([place]) 46/08/5991   Dyslipidemia    Elevated hemoglobin A1c 07/08/2016   Estrogen deficiency 12/06/2018   Foraminal stenosis of lumbar region 09/09/2018   Hair loss 07/20/2017   History of lumbar fusion 09/09/2018   Hormone replacement therapy (HRT) 07/20/2017   HTN (hypertension)    Hypertension 07/08/2016   Hypokalemia 12/06/2018   Hypothyroidism    Left medial tibial plateau fracture 11/10/2018   Lumbar radiculopathy 09/09/2018   Referred to NS 09/09/2018   Migraine    Mild memory disturbance 11/13/2013   Morbid obesity ([place]) 07/08/2016   Multiple allergies    Obesity    Rash associated with COVID-19 10/02/2020   Restless legs syndrome (RLS) 01/12/2013   Follows with Dr. Jannifer Franklin who prescribes Requip and gabapentin   RLS (restless legs syndrome)    Vitamin D deficiency 12/06/2018   Wears glasses 07/08/2016    Past Surgical History:  Procedure Laterality Date   BREAST BIOPSY  5701,7793   x 2 benign lesion   CERVICAL DISCECTOMY  2003   CERVICAL FUSION  2010   x2   Hand fracture surgery     LUMBAR FUSION  2016   VAGINAL HYSTERECTOMY  2009    Current Medications: Current Meds  Medication Sig   albuterol (VENTOLIN HFA) 108 (90 Base) MCG/ACT inhaler Inhale 2 puffs  into the lungs every 6 (six) hours as needed for wheezing or shortness of breath.   buPROPion (WELLBUTRIN XL) 300 MG 24 hr tablet Take 1 tablet (300 mg total) by mouth daily.   cholecalciferol (VITAMIN D) 1000 units tablet Take 5,000 Units by mouth daily.    DULoxetine (CYMBALTA) 60 MG capsule Take 1 capsule (60 mg total) by mouth daily.   ELIQUIS 5 MG TABS tablet TAKE 1 TABLET BY MOUTH TWICE A DAY   estradiol (ESTRACE) 2 MG tablet Take 1 tablet (2 mg total) by mouth daily.   furosemide (LASIX) 20 MG tablet Take 1-2 tablets (20-40 mg total)  by mouth daily. (Patient taking differently: Take 20-40 mg by mouth daily as needed for fluid or edema.)   gabapentin (NEURONTIN) 300 MG capsule Take 300 mg by mouth 2 (two) times daily.   levothyroxine (SYNTHROID) 150 MCG tablet Take 1 tablet (150 mcg total) by mouth daily.   magnesium oxide (MAG-OX) 400 MG tablet TAKE 1 TABLET BY MOUTH EVERY DAY   meloxicam (MOBIC) 7.5 MG tablet Take 1 tablet (7.5 mg total) by mouth daily.   metoprolol succinate (TOPROL-XL) 25 MG 24 hr tablet Take 0.5 tablets (12.5 mg total) by mouth daily.   Potassium Chloride ER 20 MEQ TBCR Take 20 mEq by mouth daily.   progesterone (PROMETRIUM) 100 MG capsule Take 1 capsule (100 mg total) by mouth daily.   rOPINIRole (REQUIP XL) 4 MG 24 hr tablet Take 1 tablet (4 mg total) by mouth daily.   rOPINIRole (REQUIP) 2 MG tablet Take 1/2 tablet at lunch and 1/2 tablet at bed time.   telmisartan (MICARDIS) 20 MG tablet Take 1.5 tablets (30 mg total) by mouth daily.   traZODone (DESYREL) 50 MG tablet Take 0.5-1.5 tablets (25-75 mg total) by mouth at bedtime as needed for sleep.   triamcinolone cream (KENALOG) 0.1 % Apply 1 application topically 2 (two) times daily.     Allergies:   Bee venom, Yellow jacket venom, Biaxin [clarithromycin], Gabapentin, Robaxin [methocarbamol], Abilify [aripiprazole], Buspar [buspirone], Lisinopril, Maxzide [triamterene-hctz], Phentermine, and Seroquel [quetiapine fumarate]   Social History   Socioeconomic History   Marital status: Married    Spouse name: [name] [name]   Number of children: 2   Years of education: COLLEGE   Highest education level: Associate degree: academic program  Occupational History   Occupation: OWNER    Employer: [employer]  Tobacco Use   Smoking status: Never    Passive exposure: Never   Smokeless tobacco: Never  Vaping Use   Vaping Use: Never used  Substance and Sexual Activity   Alcohol use: No    Alcohol/week: 0.0 standard drinks   Drug use: No   Sexual  activity: Yes    Partners: Male    Birth control/protection: None  Other Topics Concern   Not on file  Social History Narrative   Patient is married [name] [name])  2 children [name] [name] and [name] [name])   Patient is right handed.   Patient has a college education. 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    Patient drinks 1 cups daily. Uses herbal remedies, takes a daily vitamin.   Wears her seatbelt, smoke detector in the home.   Social Determinants of Health   Financial Resource Strain: Low Risk    Difficulty of Paying Living Expenses: Not hard at all  Food Insecurity: No Food Insecurity   Worried About Charity fundraiser in the Last Year: Never true   [place] in the Last  Year: Never true  Transportation Needs: No Transportation Needs   Lack of Transportation (Medical): No   Lack of Transportation (Non-Medical): No  Physical Activity: Unknown   Days of Exercise per Week: 0 days   Minutes of Exercise per Session: Not on file  Stress: No Stress Concern Present   Feeling of Stress : Not at all  Social Connections: Socially Integrated   Frequency of Communication with Friends and Family: More than three times a week   Frequency of Social Gatherings with Friends and Family: More than three times a week   Attends Religious Services: More than 4 times per year   Active Member of [employer] or Organizations: Yes   Attends Music therapist: More than 4 times per year   Marital Status: Married     Family History: The patient's family history includes Arthritis/Rheumatoid in her mother; COPD in her mother; Diabetes in her brother, father, and mother; Hearing loss in her father and mother; Heart disease in her father and mother; Hypertension in her father; Liver cancer in her mother; Mental illness in her mother. ROS:   Please see the history of present illness.    All 14 point review of systems negative except as described per history of present illness  EKGs/Labs/Other Studies Reviewed:       Recent Labs: 10/28/2020: TSH 3.30 03/20/2021: B Natriuretic Peptide 54.7; BUN 18; Creatinine, Ser 0.64; Hemoglobin 14.8; Platelets 291; Potassium 3.7; Sodium 139  Recent Lipid Panel    Component Value Date/Time   CHOL 150 08/02/2018 0902   TRIG 183.0 (H) 08/02/2018 0902   HDL 48.60 08/02/2018 0902   CHOLHDL 3 08/02/2018 0902   VLDL 36.6 08/02/2018 0902   LDLCALC 65 08/02/2018 0902    Physical Exam:    VS:  BP (!) 178/92 (BP Location: Right Arm)    Pulse 98    Ht 5\' 3"  (1.6 m)    Wt 262 lb (118.8 kg)    SpO2 96%    BMI 46.41 kg/m     Wt Readings from Last 3 Encounters:  04/24/21 262 lb (118.8 kg)  04/15/21 260 lb (117.9 kg)  04/01/21 260 lb (117.9 kg)     GEN:  Well nourished, well developed in no acute distress HEENT: Normal NECK: No JVD; No carotid bruits LYMPHATICS: No lymphadenopathy CARDIAC: RRR, no murmurs, no rubs, no gallops RESPIRATORY:  Clear to auscultation without rales, wheezing or rhonchi  ABDOMEN: Soft, non-tender, non-distended MUSCULOSKELETAL:  No edema; No deformity  SKIN: Warm and dry LOWER EXTREMITIES: no swelling NEUROLOGIC:  Alert and oriented x 3 PSYCHIATRIC:  Normal affect   ASSESSMENT:    1. Paroxysmal atrial fibrillation (HCC)   2. Primary hypertension   3. Hypothyroidism due to acquired atrophy of thyroid   4. Acquired thrombophilia (HCC)-eliquis   5. Dyslipidemia   6. Morbid obesity ([place])   7. BMI 45.0-49.9, adult (HCC)    PLAN:    In order of problems listed above:  Paroxysmal atrial fibrillation maintaining sinus rhythm her CHA2DS2-VASc is at least 3.  We will continue anticoagulation for now atrial size is normal based on last echocardiogram Essential hypertension blood pressure very elevated today I will check her Chem-7 today if Chem-7 is fine we will probably Aldactone. Swelling of lower extremities with shortness of breath.  It looks like she snores a lot I am worried that she may have significant sleep apnea.  She tells me  that 10 years ago she had sleep study done which  was negative however her symptomatology strongly suggest presence of obstructive sleep apnea I think if she truly had this problem proper management of it will make her feel significantly better hopefully she will more energy will be able to lose some weight and will be multiple benefits obstructive sleep apnea can trigger atrial fibrillation can also create blood pressure which is difficult to control. Dyslipidemia I did review her K PN which show me Cholesterol from 2020 which was actually excellent.  HDL was 48 LDL of 65.  We will check her cholesterol   Medication Adjustments/Labs and Tests Ordered: Current medicines are reviewed at length with the patient today.  Concerns regarding medicines are outlined above.  No orders of the defined types were placed in this encounter.  Medication changes: No orders of the defined types were placed in this encounter.   Signed, Park Liter, MD, [facility] 04/24/2021 2:19 PM    [facility]

## 2021-04-25 LAB — BASIC METABOLIC PANEL
BUN/Creatinine Ratio: 26 — ABNORMAL HIGH (ref 9–23)
BUN: 14 mg/dL (ref 6–24)
CO2: 27 mmol/L (ref 20–29)
Calcium: 9.5 mg/dL (ref 8.7–10.2)
Chloride: 103 mmol/L (ref 96–106)
Creatinine, Ser: 0.53 mg/dL — ABNORMAL LOW (ref 0.57–1.00)
Glucose: 140 mg/dL — ABNORMAL HIGH (ref 70–99)
Potassium: 3.8 mmol/L (ref 3.5–5.2)
Sodium: 141 mmol/L (ref 134–144)
eGFR: 110 mL/min/{1.73_m2} (ref >59)

## 2021-04-25 LAB — LIPID PANEL
Chol/HDL Ratio: 3.5 ratio (ref 0.0–4.4)
Cholesterol, Total: 164 mg/dL (ref 100–199)
HDL: 47 mg/dL (ref >39)
LDL Chol Calc (NIH): 78 mg/dL (ref 0–99)
Triglycerides: 234 mg/dL — ABNORMAL HIGH (ref 0–149)
VLDL Cholesterol Cal: 39 mg/dL (ref 5–40)

## 2021-04-28 ENCOUNTER — Ambulatory Visit (INDEPENDENT_AMBULATORY_CARE_PROVIDER_SITE_OTHER): Payer: 59

## 2021-04-28 ENCOUNTER — Other Ambulatory Visit: Payer: Self-pay

## 2021-04-28 DIAGNOSIS — M8438XA Stress fracture, other site, initial encounter for fracture: Secondary | ICD-10-CM

## 2021-04-29 ENCOUNTER — Encounter: Payer: Self-pay | Admitting: Family Medicine

## 2021-04-29 ENCOUNTER — Ambulatory Visit (INDEPENDENT_AMBULATORY_CARE_PROVIDER_SITE_OTHER): Payer: 59 | Admitting: Family Medicine

## 2021-04-29 VITALS — BP 167/82 | HR 91 | Temp 98.1°F | Ht 63.0 in | Wt 266.0 lb

## 2021-04-29 DIAGNOSIS — R21 Rash and other nonspecific skin eruption: Secondary | ICD-10-CM | POA: Diagnosis not present

## 2021-04-29 DIAGNOSIS — R600 Localized edema: Secondary | ICD-10-CM

## 2021-04-29 MED ORDER — CLOBETASOL PROPIONATE 0.05 % EX OINT: 1.0000 "application " | TOPICAL_OINTMENT | Freq: Two times a day (BID) | CUTANEOUS | 1 refills | Status: DC

## 2021-04-29 NOTE — Progress Notes (Signed)
This visit occurred during the SARS-CoV-2 public health emergency.  Safety protocols were in place, including screening questions prior to the visit, additional usage of staff PPE, and extensive cleaning of exam room while observing appropriate contact time as indicated for disinfecting solutions.    [name] [name] , 06-18-1965, 56 y.o., female MRN: [id number] Patient Care Team    Relationship Specialty Notifications Start End  [name], DO PCP - General Family Medicine  07/07/16   Nahser, Wonda Cheng, MD PCP - Cardiology Cardiology Admissions 12/15/18   Ob/Gyn, [name]    07/08/16   Wilford Corner, MD Consulting Physician Gastroenterology  07/08/16   [name], MD (Inactive) Consulting Physician Neurology  07/08/16   [name], MD Attending Physician Endocrinology  07/08/16    Comment: Pt established with Si Raider, NP at this location. - Integrative med- thyroid  [name], MD Consulting Physician Rehabilitation  03/06/19    Comment: Neurosurgeon-spine and scoliosis specialist    Chief Complaint  Patient presents with   Rash    Pt c/o rash top of feet x 6 mos; worsening within last 2 weeks;      Subjective: Pt presents for an OV with complaints of LE edema, elevated BP and continued rash on her feet that is itchy. Last appt we tried kenalog cream and she reports it did not make it better or worse.    Prior note: Pt presents for an OV with complaints of continued rash on her bilateral feet that she has noticed initially since she had COVID back in June.  She was tried on antifungal at that time and she states that the antifungal cream was soothing but never really took away the rash. She also complains of bilateral toe numbness.  She has a history of lumbar fusion in 2016.  She reports the numbness has started over the last 4-6 weeks.  She is not in contact with her neurology team any longer, and reports she would like a referral to a new neurology team  for further evaluation.  She denies worsening low back pain at this time.  Depression screen [facility] 2/9 04/29/2021 03/18/2021 10/02/2020 01/18/2020 12/05/2019  Decreased Interest 0 0 1 0 0  Down, Depressed, Hopeless 0 0 1 0 0  PHQ - 2 Score 0 0 2 0 0  Altered sleeping - 0 2 - -  Tired, decreased energy - 0 1 - -  Change in appetite - 0 1 - -  Feeling bad or failure about yourself  - 0 0 - -  Trouble concentrating - 0 0 - -  Moving slowly or fidgety/restless - 0 0 - -  Suicidal thoughts - 0 0 - -  PHQ-9 Score - 0 6 - -  Difficult doing work/chores - Not difficult at all - - -  Some recent data might be hidden    Allergies  Allergen Reactions   Bee Venom Swelling and Anaphylaxis   Yellow Jacket Venom Swelling   Biaxin [Clarithromycin] Diarrhea and Rash   Gabapentin Nausea And Vomiting    No reaction to Horizant   Robaxin [Methocarbamol] Swelling    Feet, chest pain, headache   Abilify [Aripiprazole] Other (See Comments)    MOOD SWING   Buspar [Buspirone] Diarrhea   Lisinopril Cough   Maxzide [Triamterene-Hctz] Rash   Phentermine Other (See Comments)    MOOD CHANGE    Seroquel [Quetiapine Fumarate] Other (See Comments)    MOOD SWINGS  Social History   Social History Narrative   Patient is married [name])  2 children [name] and Lee)   Patient is right handed.   Patient has a college education. 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    Patient drinks 1 cups daily. Uses herbal remedies, takes a daily vitamin.   Wears her seatbelt, smoke detector in the home.   Past Medical History:  Diagnosis Date   Acute bronchitis due to COVID-19 virus 09/06/2020   Anxiety 01/12/2013   Arthralgia 07/08/2016   Asthma    Atrial fibrillation ([place]) 12/06/2018   B12 deficiency 01/31/2018   Chicken pox    Chronic insomnia 11/24/2016   Clostridium difficile infection 04/02/2014, 02/06/2014   Complex tear of medial meniscus of left knee as current injury 12/01/2018   Complex tear of medial meniscus of left knee  as current injury 12/01/2018   DDD (degenerative disc disease), lumbar 09/09/2018   Depression    Diabetes mellitus without complication ([place]) 65/11/9355   Dyslipidemia    Elevated hemoglobin A1c 07/08/2016   Estrogen deficiency 12/06/2018   Foraminal stenosis of lumbar region 09/09/2018   Hair loss 07/20/2017   History of lumbar fusion 09/09/2018   Hormone replacement therapy (HRT) 07/20/2017   HTN (hypertension)    Hypertension 07/08/2016   Hypokalemia 12/06/2018   Hypothyroidism    Left medial tibial plateau fracture 11/10/2018   Lumbar radiculopathy 09/09/2018   Referred to NS 09/09/2018   Migraine    Mild memory disturbance 11/13/2013   Morbid obesity ([place]) 07/08/2016   Multiple allergies    Obesity    Rash associated with COVID-19 10/02/2020   Restless legs syndrome (RLS) 01/12/2013   Follows with Dr. Jannifer Franklin who prescribes Requip and gabapentin   RLS (restless legs syndrome)    Vitamin D deficiency 12/06/2018   Wears glasses 07/08/2016   Past Surgical History:  Procedure Laterality Date   BREAST BIOPSY  0177,9390   x 2 benign lesion   CERVICAL DISCECTOMY  2003   CERVICAL FUSION  2010   x2   Hand fracture surgery     LUMBAR FUSION  2016   VAGINAL HYSTERECTOMY  2009   Family History  Problem Relation Age of Onset   Hypertension Father    Diabetes Father    Heart disease Father    Hearing loss Father    Diabetes Brother    Liver cancer Mother    COPD Mother    Mental illness Mother    Diabetes Mother    Hearing loss Mother    Heart disease Mother    Arthritis/Rheumatoid Mother    Allergies as of 04/29/2021       Reactions   Bee Venom Swelling, Anaphylaxis   Yellow Jacket Venom Swelling   Biaxin [clarithromycin] Diarrhea, Rash   Gabapentin Nausea And Vomiting   No reaction to Horizant   Robaxin [methocarbamol] Swelling   Feet, chest pain, headache   Abilify [aripiprazole] Other (See Comments)   MOOD SWING   Buspar [buspirone] Diarrhea   Lisinopril Cough   Maxzide  [triamterene-hctz] Rash   Phentermine Other (See Comments)   MOOD CHANGE   Seroquel [quetiapine Fumarate] Other (See Comments)   MOOD SWINGS        Medication List        Accurate as of April 29, 2021 11:03 AM. If you have any questions, ask your nurse or doctor.          albuterol 108 (90 Base) MCG/ACT inhaler Commonly known as: VENTOLIN  HFA Inhale 2 puffs into the lungs every 6 (six) hours as needed for wheezing or shortness of breath.   buPROPion 300 MG 24 hr tablet Commonly known as: WELLBUTRIN XL Take 1 tablet (300 mg total) by mouth daily.   cholecalciferol 1000 units tablet Commonly known as: VITAMIN D Take 5,000 Units by mouth daily.   DULoxetine 60 MG capsule Commonly known as: CYMBALTA Take 1 capsule (60 mg total) by mouth daily.   Eliquis 5 MG Tabs tablet Generic drug: apixaban TAKE 1 TABLET BY MOUTH TWICE A DAY   estradiol 2 MG tablet Commonly known as: ESTRACE Take 1 tablet (2 mg total) by mouth daily.   furosemide 20 MG tablet Commonly known as: LASIX Take 1-2 tablets (20-40 mg total) by mouth daily. What changed:  when to take this reasons to take this   gabapentin 300 MG capsule Commonly known as: NEURONTIN Take 300 mg by mouth 2 (two) times daily.   levothyroxine 150 MCG tablet Commonly known as: SYNTHROID Take 1 tablet (150 mcg total) by mouth daily.   magnesium oxide 400 MG tablet Commonly known as: MAG-OX TAKE 1 TABLET BY MOUTH EVERY DAY   meloxicam 7.5 MG tablet Commonly known as: MOBIC Take 1 tablet (7.5 mg total) by mouth daily.   metoprolol succinate 25 MG 24 hr tablet Commonly known as: TOPROL-XL Take 0.5 tablets (12.5 mg total) by mouth daily.   Potassium Chloride ER 20 MEQ Tbcr Take 20 mEq by mouth daily.   progesterone 100 MG capsule Commonly known as: PROMETRIUM Take 1 capsule (100 mg total) by mouth daily.   rOPINIRole 2 MG tablet Commonly known as: REQUIP Take 1/2 tablet at lunch and 1/2 tablet at bed  time.   rOPINIRole 4 MG 24 hr tablet Commonly known as: REQUIP XL Take 1 tablet (4 mg total) by mouth daily.   telmisartan 20 MG tablet Commonly known as: Micardis Take 1.5 tablets (30 mg total) by mouth daily.   traZODone 50 MG tablet Commonly known as: DESYREL Take 0.5-1.5 tablets (25-75 mg total) by mouth at bedtime as needed for sleep.   triamcinolone cream 0.1 % Commonly known as: KENALOG Apply 1 application topically 2 (two) times daily.        All past medical history, surgical history, allergies, family history, immunizations andmedications were updated in the EMR today and reviewed under the history and medication portions of their EMR.     ROS Negative, with the exception of above mentioned in HPI   Objective:  BP (!) 170/88    Pulse 91    Temp 98.1 F (36.7 C) (Oral)    Ht 5\' 3"  (1.6 m)    Wt 266 lb (120.7 kg)    SpO2 98%    BMI 47.12 kg/m  Body mass index is 47.12 kg/m. Physical Exam Vitals and nursing note reviewed.  Constitutional:      General: She is not in acute distress.    Appearance: Normal appearance. She is normal weight. She is not ill-appearing or toxic-appearing.  Eyes:     Extraocular Movements: Extraocular movements intact.     Conjunctiva/sclera: Conjunctivae normal.     Pupils: Pupils are equal, round, and reactive to light.  Musculoskeletal:     Right lower leg: Edema present.     Left lower leg: Edema present.  Skin:    Findings: Rash present.     Comments: B/l feet rash over toes, medial and lateral foot.   Neurological:  Mental Status: She is alert and oriented to person, place, and time. Mental status is at baseline.  Psychiatric:        Mood and Affect: Mood normal.        Behavior: Behavior normal.        Thought Content: Thought content normal.        Judgment: Judgment normal.     No results found. No results found. No results found for this or any previous visit (from the past 24  hour(s)).  Assessment/Plan: LYNDEL DANCEL is a 56 y.o. female present for OV for  Rash Clobetasol ointment bid to area. Referral can be placed to derm if she needs. She is established with a derm and will call for appt.  Suspect granuloma annulare as diagnosis   Lower extremity edema Encouraged her to take 1 lasix for 3 days. If BP still above goal and/or edema still present after day 3, then take additional lasix dose of 20-40 mg.  If BP still above goal on Monday, she will call in to inform us and we will increase her micardis dose.  Part of raised pressure is possibly from her pain level with her back (currently be managed by another provider)    Reviewed expectations re: course of current medical issues. Discussed self-management of symptoms. Outlined signs and symptoms indicating need for more acute intervention. Patient verbalized understanding and all questions were answered. Patient received an After-Visit Summary.    No orders of the defined types were placed in this encounter.  No orders of the defined types were placed in this encounter.  Referral Orders  No referral(s) requested today     Note is dictated utilizing voice recognition software. Although note has been proof read prior to signing, occasional typographical errors still can be missed. If any questions arise, please do not hesitate to call for verification.   electronically signed by:  Howard Pouch, DO  [place]

## 2021-04-30 ENCOUNTER — Encounter: Payer: Self-pay | Admitting: Family Medicine

## 2021-05-01 ENCOUNTER — Telehealth: Payer: Self-pay

## 2021-05-01 NOTE — Telephone Encounter (Signed)
-----   Message from Park Liter, MD sent at 04/28/2021  2:19 PM EST ----- ?Colitis acceptable, triglycerides elevated, exercises good diet and weight loss should be implemented ?

## 2021-05-01 NOTE — Telephone Encounter (Signed)
Patient notified of results.

## 2021-05-05 ENCOUNTER — Telehealth (INDEPENDENT_AMBULATORY_CARE_PROVIDER_SITE_OTHER): Payer: 59 | Admitting: Family Medicine

## 2021-05-05 ENCOUNTER — Encounter: Payer: Self-pay | Admitting: Family Medicine

## 2021-05-05 DIAGNOSIS — M25412 Effusion, left shoulder: Secondary | ICD-10-CM

## 2021-05-05 MED ORDER — DIAZEPAM 5 MG PO TABS: ORAL_TABLET | ORAL | 0 refills | Status: DC

## 2021-05-05 NOTE — Progress Notes (Signed)
Virtual Visit via Video Note ? ?I connected with [name] [name] on 05/05/21 at  8:00 AM EST by a video enabled telemedicine application and verified that I am speaking with the correct person using two identifiers. ? ?Location: ?Patient: home ?Provider: office ?  ?I discussed the limitations of evaluation and management by telemedicine and the availability of in person appointments. The patient expressed understanding and agreed to proceed. ? ?History of Present Illness: ? ?[name] [name] is a 56 year old female that is following up after an MRI of her chest.  This was representing a normal chest scan but does show an effusion within the left glenohumeral joint. ?  ?Observations/Objective: ? ? ?Assessment and Plan: ? ?Left shoulder effusion: ?MRI was confirming previous effusion appreciated on ultrasound.  MRI of the chest was otherwise normal. ?-Counseled on home exercise therapy and supportive care. ?-Can pursue injection of the glenohumeral joint. ?-Valium for procedure. ? ?Follow Up Instructions: ? ?  ?I discussed the assessment and treatment plan with the patient. The patient was provided an opportunity to ask questions and all were answered. The patient agreed with the plan and demonstrated an understanding of the instructions. ?  ?The patient was advised to call back or seek an in-person evaluation if the symptoms worsen or if the condition fails to improve as anticipated. ? ? ?Clearance Coots, MD ? ? ?

## 2021-05-05 NOTE — Assessment & Plan Note (Signed)
MRI was confirming previous effusion appreciated on ultrasound.  MRI of the chest was otherwise normal. ?-Counseled on home exercise therapy and supportive care. ?-Can pursue injection of the glenohumeral joint. ?-Valium for procedure. ?

## 2021-05-07 ENCOUNTER — Encounter: Payer: Self-pay | Admitting: Family Medicine

## 2021-05-07 ENCOUNTER — Ambulatory Visit (INDEPENDENT_AMBULATORY_CARE_PROVIDER_SITE_OTHER): Payer: 59 | Admitting: Family Medicine

## 2021-05-07 ENCOUNTER — Ambulatory Visit: Payer: Self-pay

## 2021-05-07 VITALS — BP 170/102 | Ht 63.0 in | Wt 266.0 lb

## 2021-05-07 DIAGNOSIS — M25412 Effusion, left shoulder: Secondary | ICD-10-CM | POA: Diagnosis not present

## 2021-05-07 MED ORDER — TRIAMCINOLONE ACETONIDE 40 MG/ML IJ SUSP
40.0000 mg | Freq: Once | INTRAMUSCULAR | Status: AC
Start: 2021-05-07 — End: 2021-05-07
  Administered 2021-05-07: 40 mg via INTRA_ARTICULAR

## 2021-05-07 NOTE — Assessment & Plan Note (Signed)
Acutely occurring.  Her MRI of her chest was demonstrating an effusion.  She continues to have spasms around the shoulder joint. ?-Counseled on home exercise therapy and supportive care. ?-Injection today. ?-Could consider further imaging or physical therapy. ?

## 2021-05-07 NOTE — Patient Instructions (Signed)
Good to see you ?Please use ice  ?Please send me a message in MyChart with any questions or updates.  ?Please give me a call about a week after the injection.  ? ?--Dr. Raeford Razor ? ?

## 2021-05-07 NOTE — Progress Notes (Signed)
?[name] [name] - 56 y.o. female MRN [id number]  Date of birth: 23-Jul-1965 ? ?SUBJECTIVE:  Including CC & ROS.  ?No chief complaint on file. ? ? ?[name] [name] is a 56 y.o. female that is presenting for left shoulder pain. ? ? ? ?Review of Systems ?See HPI  ? ?HISTORY: Past Medical, Surgical, Social, and Family History Reviewed & Updated per EMR.   ?Pertinent Historical Findings include: ? ?Past Medical History:  ?Diagnosis Date  ? Acute bronchitis due to COVID-19 virus 09/06/2020  ? Anxiety 01/12/2013  ? Arthralgia 07/08/2016  ? Asthma   ? Atrial fibrillation ([place]) 12/06/2018  ? B12 deficiency 01/31/2018  ? Chicken pox   ? Chronic insomnia 11/24/2016  ? Clostridium difficile infection 04/02/2014, 02/06/2014  ? Complex tear of medial meniscus of left knee as current injury 12/01/2018  ? Complex tear of medial meniscus of left knee as current injury 12/01/2018  ? DDD (degenerative disc disease), lumbar 09/09/2018  ? Depression   ? Diabetes mellitus without complication ([place]) 70/10/6281  ? Dyslipidemia   ? Elevated hemoglobin A1c 07/08/2016  ? Estrogen deficiency 12/06/2018  ? Foraminal stenosis of lumbar region 09/09/2018  ? Hair loss 07/20/2017  ? History of lumbar fusion 09/09/2018  ? Hormone replacement therapy (HRT) 07/20/2017  ? HTN (hypertension)   ? Hypertension 07/08/2016  ? Hypokalemia 12/06/2018  ? Hypothyroidism   ? Left medial tibial plateau fracture 11/10/2018  ? Lumbar radiculopathy 09/09/2018  ? Referred to NS 09/09/2018  ? Migraine   ? Mild memory disturbance 11/13/2013  ? Morbid obesity ([place]) 07/08/2016  ? Multiple allergies   ? Obesity   ? Rash associated with COVID-19 10/02/2020  ? Restless legs syndrome (RLS) 01/12/2013  ? Follows with Dr. Jannifer Franklin who prescribes Requip and gabapentin  ? RLS (restless legs syndrome)   ? Vitamin D deficiency 12/06/2018  ? Wears glasses 07/08/2016  ? ? ?Past Surgical History:  ?Procedure Laterality Date  ? BREAST BIOPSY  [id number]  ? x 2 benign lesion  ? CERVICAL DISCECTOMY  2003  ? CERVICAL FUSION   2010  ? x2  ? Hand fracture surgery    ? LUMBAR FUSION  2016  ? VAGINAL HYSTERECTOMY  2009  ? ? ? ?PHYSICAL EXAM:  ?VS: BP (!) 170/102 (BP Location: Right Arm, Patient Position: Sitting)   Ht '5\' 3"'$  (1.6 m)   Wt 266 lb (120.7 kg)   BMI 47.12 kg/m?  ?Physical Exam ?Gen: NAD, alert, cooperative with exam, well-appearing ?MSK:  ?Neurovascularly intact   ? ? ?Aspiration/Injection Procedure Note ?[name] [name] ?05-Apr-1965 ? ?Procedure: Injection ?Indications: Left shoulder pain ? ?Procedure Details ?Consent: Risks of procedure as well as the alternatives and risks of each were explained to the (patient/caregiver).  Consent for procedure obtained. ?Time Out: Verified patient identification, verified procedure, site/side was marked, verified correct patient position, special equipment/implants available, medications/allergies/relevent history reviewed, required imaging and test results available.  Performed.  The area was cleaned with iodine and alcohol swabs.   ? ?The left glenohumeral joint was injected using 3 cc of 1% lidocaine on a 21-gauge 2 inch needle.  The syringe was switched and a mixture containing 1 cc's of 40 mg Kenalog and 4 cc's of 0.25% bupivacaine was injected.  Ultrasound was used. Images were obtained in short views showing the injection.   ? ? ?A sterile dressing was applied. ? ?Patient did tolerate procedure well. ? ? ? ? ?ASSESSMENT & PLAN:  ? ?Effusion, left shoulder ?Acutely occurring.  Her MRI of her chest was demonstrating an effusion.  She continues to have spasms around the shoulder joint. ?-Counseled on home exercise therapy and supportive care. ?-Injection today. ?-Could consider further imaging or physical therapy. ? ? ? ? ?

## 2021-05-08 ENCOUNTER — Telehealth: Payer: Self-pay | Admitting: Cardiology

## 2021-05-08 DIAGNOSIS — Z6841 Body Mass Index (BMI) 40.0 and over, adult: Secondary | ICD-10-CM

## 2021-05-08 DIAGNOSIS — R0683 Snoring: Secondary | ICD-10-CM

## 2021-05-08 NOTE — Telephone Encounter (Signed)
Patient called stating she can't schedule her sleep study until an order is sent to and prior Josem Kaufmann is obtained.  ?

## 2021-05-15 ENCOUNTER — Other Ambulatory Visit: Payer: Self-pay | Admitting: Family Medicine

## 2021-05-15 ENCOUNTER — Encounter: Payer: Self-pay | Admitting: Family Medicine

## 2021-05-15 MED ORDER — TELMISARTAN 40 MG PO TABS: 60.0000 mg | ORAL_TABLET | Freq: Every day | ORAL | 0 refills | Status: DC

## 2021-05-15 NOTE — Telephone Encounter (Signed)
Please advise if pt need f/u for BP. ?

## 2021-05-15 NOTE — Telephone Encounter (Signed)
New dose of blood pressure medicine prescribed.  Patient encouraged to make follow-up appointment in 4 weeks ?

## 2021-05-22 NOTE — Telephone Encounter (Signed)
Received message from Lina Sayre, [place] asking for an order to be placed for this patient to do a home sleep study. Pt sees Dr Agustin Cree in [facility], and per his last OV note on 04/24/21: ? ? It looks like she snores a lot I am worried that she may have significant sleep apnea.  She tells me that 10 years ago she had sleep study done which was negative however her symptomatology strongly suggest presence of obstructive sleep apnea I think if she truly had this problem proper management of it will make her feel significantly better hopefully she will more energy will be able to lose some weight and will be multiple benefits obstructive sleep apnea can trigger atrial fibrillation can also create blood pressure which is difficult to control. ? ?AVS from that day by Edwyna Shell, RN shows ambulatory referral for home sleep study, but doesn't appear that order was placed. Order input at this time per request. ?

## 2021-05-28 ENCOUNTER — Telehealth: Payer: Self-pay | Admitting: *Deleted

## 2021-05-28 NOTE — Telephone Encounter (Signed)
Patient notified of sleep study appointment. She advised me that she cannot do the date given. Also she stated that she was told by Dr Halford Chessman she would be having a HST. I told her that a in lab is what was ordered. She replied " Well that's what he needs to order, since that's what we talked about." I informed her that I will speak with him and his nurse. Also her insurance may not approve the HST since they just approved the in lab study today. I then called and spoke with Delfino Lovett, telling him the situation. He states that he will inform Dr Agustin Cree and let me know if I need to try and pre cert for a HST. They will call the patient. ?

## 2021-05-29 ENCOUNTER — Encounter: Payer: Self-pay | Admitting: Family Medicine

## 2021-05-29 ENCOUNTER — Ambulatory Visit: Payer: Self-pay

## 2021-05-29 ENCOUNTER — Ambulatory Visit (HOSPITAL_BASED_OUTPATIENT_CLINIC_OR_DEPARTMENT_OTHER)
Admission: RE | Admit: 2021-05-29 | Discharge: 2021-05-29 | Disposition: A | Discharge status: Home / Self Care | Payer: 59 | Source: Ambulatory Visit | Attending: Family Medicine | Admitting: Family Medicine

## 2021-05-29 ENCOUNTER — Ambulatory Visit (INDEPENDENT_AMBULATORY_CARE_PROVIDER_SITE_OTHER): Payer: 59 | Admitting: Family Medicine

## 2021-05-29 VITALS — BP 182/90 | Ht 63.0 in | Wt 266.0 lb

## 2021-05-29 DIAGNOSIS — M5412 Radiculopathy, cervical region: Secondary | ICD-10-CM | POA: Insufficient documentation

## 2021-05-29 DIAGNOSIS — M7918 Myalgia, other site: Secondary | ICD-10-CM | POA: Diagnosis not present

## 2021-05-29 MED ORDER — METHYLPREDNISOLONE ACETATE 40 MG/ML IJ SUSP
40.0000 mg | Freq: Once | INTRAMUSCULAR | Status: AC
  Administered 2021-05-29: 40 mg via INTRA_ARTICULAR

## 2021-05-29 NOTE — Progress Notes (Signed)
?[name] [name] - 56 y.o. female MRN [id number]  Date of birth: 09-28-1965 ? ?SUBJECTIVE:  Including CC & ROS.  ?No chief complaint on file. ? ? ?[name] [name] is a 56 y.o. female that is presenting with acute periscapular pain.  She is continue to have pain in this area despite the intra-articular left shoulder injection.  This pain is significant and tender to touch. ? ? ?Review of Systems ?See HPI  ? ?HISTORY: Past Medical, Surgical, Social, and Family History Reviewed & Updated per EMR.   ?Pertinent Historical Findings include: ? ?Past Medical History:  ?Diagnosis Date  ? Acute bronchitis due to COVID-19 virus 09/06/2020  ? Anxiety 01/12/2013  ? Arthralgia 07/08/2016  ? Asthma   ? Atrial fibrillation ([place]) 12/06/2018  ? B12 deficiency 01/31/2018  ? Chicken pox   ? Chronic insomnia 11/24/2016  ? Clostridium difficile infection 04/02/2014, 02/06/2014  ? Complex tear of medial meniscus of left knee as current injury 12/01/2018  ? Complex tear of medial meniscus of left knee as current injury 12/01/2018  ? DDD (degenerative disc disease), lumbar 09/09/2018  ? Depression   ? Diabetes mellitus without complication ([place]) 90/04/4095  ? Dyslipidemia   ? Elevated hemoglobin A1c 07/08/2016  ? Estrogen deficiency 12/06/2018  ? Foraminal stenosis of lumbar region 09/09/2018  ? Hair loss 07/20/2017  ? History of lumbar fusion 09/09/2018  ? Hormone replacement therapy (HRT) 07/20/2017  ? HTN (hypertension)   ? Hypertension 07/08/2016  ? Hypokalemia 12/06/2018  ? Hypothyroidism   ? Left medial tibial plateau fracture 11/10/2018  ? Lumbar radiculopathy 09/09/2018  ? Referred to NS 09/09/2018  ? Migraine   ? Mild memory disturbance 11/13/2013  ? Morbid obesity ([place]) 07/08/2016  ? Multiple allergies   ? Obesity   ? Rash associated with COVID-19 10/02/2020  ? Restless legs syndrome (RLS) 01/12/2013  ? Follows with Dr. Jannifer Franklin who prescribes Requip and gabapentin  ? RLS (restless legs syndrome)   ? Vitamin D deficiency 12/06/2018  ? Wears glasses 07/08/2016   ? ? ?Past Surgical History:  ?Procedure Laterality Date  ? BREAST BIOPSY  [id number]  ? x 2 benign lesion  ? CERVICAL DISCECTOMY  2003  ? CERVICAL FUSION  2010  ? x2  ? Hand fracture surgery    ? LUMBAR FUSION  2016  ? VAGINAL HYSTERECTOMY  2009  ? ? ? ?PHYSICAL EXAM:  ?VS: BP (!) 182/90 (BP Location: Right Arm, Patient Position: Sitting)   Ht '5\' 3"'$  (1.6 m)   Wt 266 lb (120.7 kg)   BMI 47.12 kg/m?  ?Physical Exam ?Gen: NAD, alert, cooperative with exam, well-appearing ?MSK:  ?Neurovascularly intact   ? ?Limited ultrasound:Left thoracic back: ? ?Normal-appearing ribs in the short axis. ?No changes of the musculature in this area. ?Normal-appearing underlying lung function ? ?Summary: No structural changes appreciated ? ?Ultrasound and interpretation by Clearance Coots, MD ? ? ?Aspiration/Injection Procedure Note ?[name] [name] ?[birth year] ? ?Procedure: Injection ?Indications: Left thoracic back pain ? ?Procedure Details ?Consent: Risks of procedure as well as the alternatives and risks of each were explained to the (patient/caregiver).  Consent for procedure obtained. ?Time Out: Verified patient identification, verified procedure, site/side was marked, verified correct patient position, special equipment/implants available, medications/allergies/relevent history reviewed, required imaging and test results available.  Performed.  The area was cleaned with iodine and alcohol swabs.   ? ?The left myofascial trigger points in the thoracic region was injected using 1 cc's of 40 mg Depo-Medrol and 4 cc's  of 0.25% bupivacaine with a 25 1 1/2" needle.  Ultrasound was used. Images were obtained in short views showing the injection.   ? ? ?A sterile dressing was applied. ? ?Patient did tolerate procedure well. ? ? ? ?ASSESSMENT & PLAN:  ? ?Cervical radiculopathy ?Acute on chronic in nature.  Having pain initially that went down to the left hand.  She is having some right shoulder pain today as well.  The thoracic back pain  may be secondary to underlying nerve impingement. ?-Counseled on home exercise therapy and supportive care. ?-X-ray. ?-Could consider further imaging. ? ?Myofascial pain syndrome of thoracic spine ?Acutely occurring.  Having severe tenderness in the trigger points to the medial inferior scapular border. ?-Counseled on home exercise therapy and supportive care. ?-Injection today. ?-Could consider shockwave therapy. ? ? ? ? ?

## 2021-05-29 NOTE — Telephone Encounter (Signed)
[name] [name] is assisting patient with this request. ?

## 2021-05-29 NOTE — Patient Instructions (Signed)
Good to see you ?Please try heat on the area  ?I will call with the results.   ?Please send me a message in MyChart with any questions or updates.  ?Please see me back in 2 weeks.  ? ?--Dr. Raeford Razor ? ?

## 2021-05-29 NOTE — Assessment & Plan Note (Signed)
Acute on chronic in nature.  Having pain initially that went down to the left hand.  She is having some right shoulder pain today as well.  The thoracic back pain may be secondary to underlying nerve impingement. ?-Counseled on home exercise therapy and supportive care. ?-X-ray. ?-Could consider further imaging. ?

## 2021-05-29 NOTE — Assessment & Plan Note (Signed)
Acutely occurring.  Having severe tenderness in the trigger points to the medial inferior scapular border. ?-Counseled on home exercise therapy and supportive care. ?-Injection today. ?-Could consider shockwave therapy. ?

## 2021-06-02 ENCOUNTER — Telehealth: Payer: Self-pay | Admitting: Family Medicine

## 2021-06-02 NOTE — Telephone Encounter (Signed)
Informed of results.  ? ?Rosemarie Ax, MD ?[facility] ?06/02/2021, 9:55 AM ? ?

## 2021-06-03 ENCOUNTER — Other Ambulatory Visit: Payer: Self-pay | Admitting: Family Medicine

## 2021-06-03 DIAGNOSIS — M5412 Radiculopathy, cervical region: Secondary | ICD-10-CM

## 2021-06-03 NOTE — Progress Notes (Signed)
She is still having pain that radiates from her neck down to periscapular region.  We have tried injections in the trigger points but pain returns.  Pain has been ongoing for about 6 months.  We have tried greater than 6 weeks of physician directed home exercise therapy as well as medications and injections.  Imaging of the cervical spine was revealing for previous fusion.  We will pursue MRI to evaluate for nerve impingement and consideration of epidural. ? ?Rosemarie Ax, MD ?[facility] ?06/03/2021, 1:27 PM ? ?

## 2021-06-09 ENCOUNTER — Ambulatory Visit: Payer: 59 | Admitting: Family Medicine

## 2021-06-16 ENCOUNTER — Other Ambulatory Visit: Payer: Self-pay | Admitting: Cardiovascular Disease

## 2021-06-16 ENCOUNTER — Telehealth: Payer: Self-pay | Admitting: Family Medicine

## 2021-06-16 NOTE — Telephone Encounter (Signed)
Prescription refill request for Eliquis received. ?Indication: PAF ?Last office visit: 04/24/21  Jerene Bears MD ?Scr: 0.53 on 04/24/21 ?Age: 56 ?Weight: 118.8kg ? ?Based on above findings Eliquis '5mg'$  twice daily is the appropriate dose.  Refill approved. ? ?

## 2021-06-16 NOTE — Telephone Encounter (Signed)
Pt cld asked that her MRI be switched frm [facility] to [facility] in [place](she was advised that her Ins /[employer] only approved [place] as a perferred site/location  ?--Also advised pt that hospital MRI cost is greater than OP facility MRI (she still  wants location changed)--Advised her to contact [employer] directly ? ?--Patient says will call them for Approved changes.- ? ?FYI only ?glh ?

## 2021-06-17 ENCOUNTER — Telehealth: Payer: Self-pay | Admitting: Cardiology

## 2021-06-17 NOTE — Telephone Encounter (Signed)
Patient is currently scheduled for a in patient sleep study, she would like to do an at home sleep study.  ?

## 2021-06-23 ENCOUNTER — Ambulatory Visit: Payer: Self-pay

## 2021-06-23 ENCOUNTER — Ambulatory Visit (INDEPENDENT_AMBULATORY_CARE_PROVIDER_SITE_OTHER): Payer: 59 | Admitting: Family Medicine

## 2021-06-23 VITALS — BP 180/94 | Ht 63.0 in | Wt 250.0 lb

## 2021-06-23 DIAGNOSIS — M7918 Myalgia, other site: Secondary | ICD-10-CM

## 2021-06-23 MED ORDER — METHYLPREDNISOLONE ACETATE 40 MG/ML IJ SUSP
40.0000 mg | Freq: Once | INTRAMUSCULAR | Status: AC
  Administered 2021-06-23: 40 mg via INTRAMUSCULAR

## 2021-06-23 NOTE — Progress Notes (Signed)
?[name] [name] - 56 y.o. female MRN [id number]  Date of birth: Jan 22, 1966 ? ?SUBJECTIVE:  Including CC & ROS.  ?No chief complaint on file. ? ? ?[name] [name] is a 56 y.o. female that is presenting with acute worsening of her left thoracic pain.  She did get improvement with the previous trigger point injection.  She is going for an MRI tomorrow.  The pain is got severe over the past week or so.  It is localized to this mid thoracic region. ? ? ?Review of Systems ?See HPI  ? ?HISTORY: Past Medical, Surgical, Social, and Family History Reviewed & Updated per EMR.   ?Pertinent Historical Findings include: ? ?Past Medical History:  ?Diagnosis Date  ? Acute bronchitis due to COVID-19 virus 09/06/2020  ? Anxiety 01/12/2013  ? Arthralgia 07/08/2016  ? Asthma   ? Atrial fibrillation ([place]) 12/06/2018  ? B12 deficiency 01/31/2018  ? Chicken pox   ? Chronic insomnia 11/24/2016  ? Clostridium difficile infection 04/02/2014, 02/06/2014  ? Complex tear of medial meniscus of left knee as current injury 12/01/2018  ? Complex tear of medial meniscus of left knee as current injury 12/01/2018  ? DDD (degenerative disc disease), lumbar 09/09/2018  ? Depression   ? Diabetes mellitus without complication ([place]) 42/06/9561  ? Dyslipidemia   ? Elevated hemoglobin A1c 07/08/2016  ? Estrogen deficiency 12/06/2018  ? Foraminal stenosis of lumbar region 09/09/2018  ? Hair loss 07/20/2017  ? History of lumbar fusion 09/09/2018  ? Hormone replacement therapy (HRT) 07/20/2017  ? HTN (hypertension)   ? Hypertension 07/08/2016  ? Hypokalemia 12/06/2018  ? Hypothyroidism   ? Left medial tibial plateau fracture 11/10/2018  ? Lumbar radiculopathy 09/09/2018  ? Referred to NS 09/09/2018  ? Migraine   ? Mild memory disturbance 11/13/2013  ? Morbid obesity ([place]) 07/08/2016  ? Multiple allergies   ? Obesity   ? Rash associated with COVID-19 10/02/2020  ? Restless legs syndrome (RLS) 01/12/2013  ? Follows with Dr. Jannifer Franklin who prescribes Requip and gabapentin  ? RLS (restless legs  syndrome)   ? Vitamin D deficiency 12/06/2018  ? Wears glasses 07/08/2016  ? ? ?Past Surgical History:  ?Procedure Laterality Date  ? BREAST BIOPSY  [id number]  ? x 2 benign lesion  ? CERVICAL DISCECTOMY  2003  ? CERVICAL FUSION  2010  ? x2  ? Hand fracture surgery    ? LUMBAR FUSION  2016  ? VAGINAL HYSTERECTOMY  2009  ? ? ? ?PHYSICAL EXAM:  ?VS: BP (!) 180/94   Ht '5\' 3"'$  (1.6 m)   Wt 250 lb (113.4 kg)   BMI 44.29 kg/m?  ?Physical Exam ?Gen: NAD, alert, cooperative with exam, well-appearing ?MSK:  ?Neurovascularly intact   ? ?Limited ultrasound: Left mid thoracic back: ? ?No structural changes are appreciated in the musculature in this region. ? ?Summary: No structural changes appreciated ? ?Ultrasound and interpretation by Clearance Coots, MD ? ? ?Aspiration/Injection Procedure Note ?[name] [name] ?Jun 20, 1965 ? ?Procedure: Injection ?Indications: Left back pain ? ?Procedure Details ?Consent: Risks of procedure as well as the alternatives and risks of each were explained to the (patient/caregiver).  Consent for procedure obtained. ?Time Out: Verified patient identification, verified procedure, site/side was marked, verified correct patient position, special equipment/implants available, medications/allergies/relevent history reviewed, required imaging and test results available.  Performed.  The area was cleaned with iodine and alcohol swabs.   ? ?The left mid thoracic back was injected using 1 cc's of 40 mg Depo-Medrol and  4 cc's of 0.25% bupivacaine with a 25 1 1/2" needle.  Ultrasound was used. Images were obtained in short views showing the injection.   ? ? ?A sterile dressing was applied. ? ?Patient did tolerate procedure well. ? ? ? ?ASSESSMENT & PLAN:  ? ?Myofascial pain syndrome of thoracic spine ?Acutely occurring.  Has done well with the previous trigger point injections.  Still seems to be mostly associated with a radicular type pain and is going for an MRI of the cervical spine tomorrow. ?-Counseled on  home exercise therapy and supportive care. ?-Trigger point injections today. ?-We will await further imaging. ? ? ? ? ?

## 2021-06-23 NOTE — Addendum Note (Signed)
Addended by: Cresenciano Lick on: 06/23/2021 02:01 PM ? ? Modules accepted: Orders ? ?

## 2021-06-23 NOTE — Assessment & Plan Note (Signed)
Acutely occurring.  Has done well with the previous trigger point injections.  Still seems to be mostly associated with a radicular type pain and is going for an MRI of the cervical spine tomorrow. ?-Counseled on home exercise therapy and supportive care. ?-Trigger point injections today. ?-We will await further imaging. ?

## 2021-06-23 NOTE — Patient Instructions (Signed)
Good to see you ?Please try heat   ?Please send me a message in MyChart with any questions or updates.  ?We'll setup a virtual visit once the MRi is resulted.  ? ?--Dr. Raeford Razor ? ?

## 2021-06-24 ENCOUNTER — Other Ambulatory Visit: Payer: Self-pay | Admitting: Cardiology

## 2021-06-24 ENCOUNTER — Ambulatory Visit (INDEPENDENT_AMBULATORY_CARE_PROVIDER_SITE_OTHER): Payer: 59

## 2021-06-24 ENCOUNTER — Other Ambulatory Visit: Payer: Self-pay

## 2021-06-24 DIAGNOSIS — M5412 Radiculopathy, cervical region: Secondary | ICD-10-CM | POA: Diagnosis not present

## 2021-06-24 DIAGNOSIS — G8929 Other chronic pain: Secondary | ICD-10-CM

## 2021-06-24 DIAGNOSIS — R0683 Snoring: Secondary | ICD-10-CM

## 2021-06-25 ENCOUNTER — Telehealth: Payer: Self-pay | Admitting: *Deleted

## 2021-06-25 NOTE — Telephone Encounter (Signed)
Received a fax from [employer], the patient's insurance denying the [place] home sleep test. It states that we are out of the patient's Network for HST services. Jacobo Forest, RN notified. ?

## 2021-06-26 ENCOUNTER — Telehealth (INDEPENDENT_AMBULATORY_CARE_PROVIDER_SITE_OTHER): Payer: 59 | Admitting: Family Medicine

## 2021-06-26 DIAGNOSIS — M5412 Radiculopathy, cervical region: Secondary | ICD-10-CM | POA: Diagnosis not present

## 2021-06-26 NOTE — Assessment & Plan Note (Signed)
Following up for the MRI that showed left-sided changes that could contribute to the pain that she is experiencing the left chest as well as the left upper thoracic back. ?-Counseled on home exercise therapy and supportive care. ?-Pursue epidural. ?

## 2021-06-26 NOTE — Progress Notes (Signed)
Virtual Visit via Video Note ? ?I connected with [name] [name] on 06/26/21 at  2:50 PM EDT by a video enabled telemedicine application and verified that I am speaking with the correct person using two identifiers. ? ?Location: ?Patient: home ?Provider: office ?  ?I discussed the limitations of evaluation and management by telemedicine and the availability of in person appointments. The patient expressed understanding and agreed to proceed. ? ?History of Present Illness: ? ?[name] is a 56 year old female that is following up after MRI of her cervical spine.  This was demonstrating a solid bony fusion from C4-C6 7.  With a C4-C5 ACDF.  There was C7-T1 moderate canal stenosis as well as moderate bilateral foraminal stenosis ? ?Observations/Objective: ? ? ?Assessment and Plan: ? ?Cervical radiculopathy: ?Following up for the MRI that showed left-sided changes that could contribute to the pain that she is experiencing the left chest as well as the left upper thoracic back. ?-Counseled on home exercise therapy and supportive care. ?-Pursue epidural. ? ?Follow Up Instructions: ? ?  ?I discussed the assessment and treatment plan with the patient. The patient was provided an opportunity to ask questions and all were answered. The patient agreed with the plan and demonstrated an understanding of the instructions. ?  ?The patient was advised to call back or seek an in-person evaluation if the symptoms worsen or if the condition fails to improve as anticipated. ? ?Clearance Coots, MD ? ? ?

## 2021-06-27 ENCOUNTER — Telehealth: Payer: Self-pay | Admitting: Family Medicine

## 2021-06-27 NOTE — Telephone Encounter (Signed)
Denial letter received from pt's Ins [employer] for DOS 3/30 & 4/24 states PAC required.(sending copies of denial ltr via email for review). ?--Patient also wanted to know who Epidural order was sent to ? (Advised [facility] has specific providers they are contracted with, she will need to call them to get Facility name & call us back so Order can be sent to that provider). ?--Forward message to med asst. ?-glh ? ? ? ?  ?

## 2021-06-30 ENCOUNTER — Encounter: Payer: Self-pay | Admitting: Family Medicine

## 2021-07-01 ENCOUNTER — Telehealth: Payer: Self-pay | Admitting: Family Medicine

## 2021-07-01 NOTE — Telephone Encounter (Signed)
Patient says to send Order for Epidural to [facility](part of  ) order can be sent thru Epic or faxed. ?P) [phone]  ?F) [phone] ? ?--glh ?

## 2021-07-02 NOTE — Addendum Note (Signed)
Addended by: Cresenciano Lick on: 07/02/2021 10:25 AM ? ? Modules accepted: Orders ? ?

## 2021-07-02 NOTE — Telephone Encounter (Signed)
Order updated and faxed to number below.

## 2021-07-06 ENCOUNTER — Encounter (HOSPITAL_BASED_OUTPATIENT_CLINIC_OR_DEPARTMENT_OTHER): Payer: 59 | Admitting: Cardiovascular Disease

## 2021-07-07 ENCOUNTER — Telehealth: Payer: Self-pay | Admitting: Family Medicine

## 2021-07-07 ENCOUNTER — Telehealth: Payer: Self-pay | Admitting: Cardiovascular Disease

## 2021-07-07 ENCOUNTER — Other Ambulatory Visit: Payer: Self-pay

## 2021-07-07 MED ORDER — FUROSEMIDE 20 MG PO TABS: 20.0000 mg | ORAL_TABLET | Freq: Every day | ORAL | 0 refills | Status: DC

## 2021-07-07 NOTE — Telephone Encounter (Signed)
Pt called states her back pain isn't getting any better & now has radiated to left rib & under arm. ?--Patient request pain Rx. ? ?--Pt uses :  ? ?[id number]- [place], [place] - [address] Phone:  [phone] ?Fax:  [phone] ?  ? ?--glh ?

## 2021-07-07 NOTE — Telephone Encounter (Signed)
Pt called to cancel appt for Dr Agustin Cree because we are out of the patient's Network for [employer]. She is requesting call back to find out why this is out of network.  ?

## 2021-07-08 ENCOUNTER — Encounter: Payer: Self-pay | Admitting: Family Medicine

## 2021-07-08 MED ORDER — HYDROCODONE-ACETAMINOPHEN 5-325 MG PO TABS: 1.0000 | ORAL_TABLET | Freq: Three times a day (TID) | ORAL | 0 refills | Status: DC | PRN

## 2021-07-08 NOTE — Telephone Encounter (Signed)
Spoke with patient about her pain.  We will send in [place].  She is meeting with pain clinic in order to pursue the epidural ? ?Rosemarie Ax, MD ?[facility] ?07/08/2021, 12:28 PM ? ?

## 2021-07-09 ENCOUNTER — Ambulatory Visit
Payer: 59 | Attending: Student in an Organized Health Care Education/Training Program | Admitting: Student in an Organized Health Care Education/Training Program

## 2021-07-09 ENCOUNTER — Encounter: Payer: Self-pay | Admitting: Student in an Organized Health Care Education/Training Program

## 2021-07-09 VITALS — BP 174/87 | HR 85 | Temp 97.2°F | Resp 16 | Ht 63.0 in | Wt 250.0 lb

## 2021-07-09 DIAGNOSIS — G894 Chronic pain syndrome: Secondary | ICD-10-CM | POA: Diagnosis present

## 2021-07-09 DIAGNOSIS — M5412 Radiculopathy, cervical region: Secondary | ICD-10-CM | POA: Insufficient documentation

## 2021-07-09 DIAGNOSIS — Q761 Klippel-Feil syndrome: Secondary | ICD-10-CM | POA: Diagnosis present

## 2021-07-09 MED ORDER — GABAPENTIN 300 MG PO CAPS
600.0000 mg | ORAL_CAPSULE | Freq: Two times a day (BID) | ORAL | 1 refills | Status: DC
Start: 2021-07-09 — End: 2021-09-30

## 2021-07-09 NOTE — Progress Notes (Signed)
[name]  Service Category: E/M  Provider: Gillis Santa, MD  ?DOB: 1965-04-18  DOS: 07/09/2021  Referring Provider: Rosemarie Ax, MD  ?MRN: [id number]  Setting: Ambulatory outpatient  PCP: [name], DO  ?Type: New Patient  Specialty: Interventional Pain Management    ?Location: Office  Delivery: Face-to-face    ? ?Primary Reason(s) for Visit: Encounter for initial evaluation of one or more chronic problems (new to examiner) potentially causing chronic pain, and posing a threat to normal musculoskeletal function. (Level of risk: High) ?CC: Back Pain (Upper left) ? ?HPI  ?[name] is a 56 y.o. year old, female patient, who comes for the first time to our practice referred by Rosemarie Ax, MD for our initial evaluation of her chronic pain. She has Restless legs syndrome (RLS); Elevated hemoglobin A1c; Hypertension; Hypothyroidism; Dyslipidemia; Depression; Arthralgia; Morbid obesity ([place]); Chronic insomnia; Hormone replacement therapy (HRT); B12 deficiency; DDD (degenerative disc disease), lumbar; Foraminal stenosis of lumbar region; History of lumbar fusion; Lumbar radiculopathy; Hypokalemia; Atrial fibrillation ([place]); Vitamin D deficiency; Estrogen deficiency; Acquired thrombophilia (HCC)-eliquis; Neuropathy; Chest discomfort; Effusion, left shoulder; AC joint derangement; BMI 45.0-49.9, adult ([place]); Scapular dysfunction; Stress fracture of rib; Multiple allergies; Migraine; Clostridium difficile infection; Chicken pox; Asthma; Anxiety; Myofascial pain syndrome of thoracic spine; Cervical radicular pain; Cervical fusion syndrome; and Chronic pain syndrome on their problem list. Today she comes in for evaluation of her Back Pain (Upper left) ? ?Pain Assessment: ?Location: Left, Upper Back ?Radiating: back of left upper arm and down left flank to below ribs ?Onset: More than a month ago ?Duration: Chronic pain ?Quality: Stabbing, Constant ?Severity: 10-Worst pain ever/10 (subjective,  self-reported pain score)  ?Effect on ADL: limits ability to concentrate ?Timing: Constant ?Modifying factors: hydrocodone/apap ?BP: (!) 174/87  HR: 85 ? ?Onset and Duration: Present longer than 3 months ?Cause of pain: Unknown ?Severity: Getting worse, NAS-11 at its worse: 10/10, NAS-11 at its best: 8/10, NAS-11 now: 8/10, and NAS-11 on the average: 10/10 ?Timing: Not influenced by the time of the day ?Aggravating Factors:  sitting  ?Alleviating Factors:  injections that helped x 2 days each time.  ?Associated Problems: Depression, Inability to concentrate, Nausea, Numbness, Sadness, Spasms, Sweating, Temperature changes, Tingling, Weakness, Pain that wakes patient up, and Pain that does not allow patient to sleep ?Quality of Pain: Aching, Agonizing, Constant, Deep, Disabling, Nagging, Sharp, Sickening, Stabbing, Tender, and Tiring ?Previous Examinations or Tests: MRI scan ?Previous Treatments: Narcotic medications, Steroid treatments by mouth, TENS, and Trigger point injections ? ?[name] is a pleasant 56 year old female who presents with a chief complaint of neck pain with radiation into her left arm and shoulder in a dermatomal fashion.  Of note she has a history of C4-C5 ACDF with associated adjacent segment disease and significant disc herniations that are resulting in cervical radicular pain.  Her cervical MRI as below.  She has done physical therapy in the past.  She has tried hydrocodone for acute pain with limited response.  She is currently on Cymbalta 60 mg daily.  She is on gabapentin 300 mg twice a day which I recommend we increase to 600 mg twice a day.  She also takes meloxicam 7.5 mg daily which I cautioned her against given that she is on Eliquis.  She has done physical therapy in the past with limited response.  She has tried to perform physician directed home stretching exercises for her left radicular pain with no benefit.  She has had trigger point injections that  only helped for 2 days.  She is  here to discuss cervical epidural steroid injection. ? ? ?Meds  ? ?Current Outpatient Medications:  ?  albuterol (VENTOLIN HFA) 108 (90 Base) MCG/ACT inhaler, Inhale 2 puffs into the lungs every 6 (six) hours as needed for wheezing or shortness of breath., Disp: 6.7 each, Rfl: 1 ?  apixaban (ELIQUIS) 5 MG TABS tablet, TAKE 1 TABLET BY MOUTH TWICE A DAY, Disp: 60 tablet, Rfl: 11 ?  buPROPion (WELLBUTRIN XL) 300 MG 24 hr tablet, Take 1 tablet (300 mg total) by mouth daily., Disp: 90 tablet, Rfl: 1 ?  cholecalciferol (VITAMIN D) 1000 units tablet, Take 5,000 Units by mouth daily. , Disp: , Rfl:  ?  clobetasol ointment (TEMOVATE) 8.67 %, Apply 1 application topically 2 (two) times daily., Disp: 45 g, Rfl: 1 ?  DULoxetine (CYMBALTA) 60 MG capsule, Take 1 capsule (60 mg total) by mouth daily., Disp: 90 capsule, Rfl: 1 ?  estradiol (ESTRACE) 2 MG tablet, Take 1 tablet (2 mg total) by mouth daily., Disp: 90 tablet, Rfl: 1 ?  furosemide (LASIX) 20 MG tablet, Take 1-2 tablets (20-40 mg total) by mouth daily., Disp: 30 tablet, Rfl: 0 ?  HYDROcodone-acetaminophen (NORCO/VICODIN) 5-325 MG tablet, Take 1 tablet by mouth every 8 (eight) hours as needed., Disp: 15 tablet, Rfl: 0 ?  levothyroxine (SYNTHROID) 150 MCG tablet, Take 1 tablet (150 mcg total) by mouth daily., Disp: 90 tablet, Rfl: 3 ?  magnesium oxide (MAG-OX) 400 MG tablet, TAKE 1 TABLET BY MOUTH EVERY DAY, Disp: 30 tablet, Rfl: 0 ?  meloxicam (MOBIC) 7.5 MG tablet, Take 1 tablet (7.5 mg total) by mouth daily., Disp: 90 tablet, Rfl: 1 ?  metoprolol succinate (TOPROL-XL) 25 MG 24 hr tablet, Take 0.5 tablets (12.5 mg total) by mouth daily., Disp: 45 tablet, Rfl: 1 ?  Potassium Chloride ER 20 MEQ TBCR, Take 20 mEq by mouth daily., Disp: 90 tablet, Rfl: 3 ?  progesterone (PROMETRIUM) 100 MG capsule, Take 1 capsule (100 mg total) by mouth daily., Disp: 90 capsule, Rfl: 1 ?  rOPINIRole (REQUIP XL) 4 MG 24 hr tablet, Take 1 tablet (4 mg total) by mouth daily., Disp: 90 tablet,  Rfl: 3 ?  rOPINIRole (REQUIP) 2 MG tablet, Take 1/2 tablet at lunch and 1/2 tablet at bed time., Disp: 90 tablet, Rfl: 3 ?  telmisartan (MICARDIS) 40 MG tablet, Take 1.5 tablets (60 mg total) by mouth daily., Disp: 135 tablet, Rfl: 0 ?  gabapentin (NEURONTIN) 300 MG capsule, Take 2 capsules (600 mg total) by mouth 2 (two) times daily., Disp: 120 capsule, Rfl: 1 ? ?Imaging Review  ?Cervical Imaging: ?Cervical MR wo contrast: Results for orders placed in visit on 06/24/21 ? ?MR CERVICAL SPINE WO CONTRAST ? ?Narrative ?CLINICAL DATA:  Acute neck and left shoulder blade pain for 6 ?months. History of cervical spine surgery. ? ?EXAM: ?MRI CERVICAL SPINE WITHOUT CONTRAST ? ?TECHNIQUE: ?Multiplanar, multisequence MR imaging of the cervical spine was ?performed. No intravenous contrast was administered. ? ?COMPARISON:  X-ray cervical 05/29/2021. ? ?FINDINGS: ?Alignment: Approximately 2-3 mm of anterolisthesis of C7 on T1, ?likely degenerative given facet arthropathy and degenerative changes ?at this level. Straightening of the normal cervical lordosis. ? ?Vertebrae: C4-C5 ACDF. Evidence of fusion across the C4-C5 disc ?space. Also, fusion across the C5-C6 and C6-C7 disc space. No marrow ?edema to suggest acute fracture or discitis/osteomyelitis. No ?suspicious bone lesions. ? ?Cord: Normal cord signal. ? ?Posterior Fossa, vertebral arteries, paraspinal tissues: Visualized ?vertebral artery flow  voids are maintained. No paraspinal edema. ?Visualized posterior fossa is unremarkable. ? ?Disc levels: ? ?C2-C3: No significant disc protrusion, foraminal stenosis, or canal ?stenosis. ? ?C3-C4: Posterior disc osteophyte complex with left greater than ?right facet and uncovertebral hypertrophy. Resulting mild to ?moderate left and mild right foraminal stenosis and mild canal ?stenosis. ? ?C4-C5: ACDF.  Patent canal and foramina. ? ?C5-C6: Mild left uncovertebral hypertrophy and facet arthropathy. ?Mild endplate spurring. Mild canal  stenosis without significant ?foraminal stenosis. ? ?C6-C7: Mild endplate spurring with left greater than right facet and ?uncovertebral hypertrophy. Resulting mild-to-moderate left and mild ?right forami

## 2021-07-09 NOTE — Progress Notes (Signed)
Safety precautions to be maintained throughout the outpatient stay will include: orient to surroundings, keep bed in low position, maintain call bell within reach at all times, provide assistance with transfer out of bed and ambulation.  

## 2021-07-09 NOTE — Patient Instructions (Addendum)
Need clearance to stop Eliquis 3 days prior ? ?GENERAL RISKS AND COMPLICATIONS ? ?What are the risk, side effects and possible complications? ?Generally speaking, most procedures are safe.  However, with any procedure there are risks, side effects, and the possibility of complications.  The risks and complications are dependent upon the sites that are lesioned, or the type of nerve block to be performed.  The closer the procedure is to the spine, the more serious the risks are.  Great care is taken when placing the radio frequency needles, block needles or lesioning probes, but sometimes complications can occur. ?Infection: Any time there is an injection through the skin, there is a risk of infection.  This is why sterile conditions are used for these blocks.  There are four possible types of infection. ?Localized skin infection. ?Central Nervous System Infection-This can be in the form of Meningitis, which can be deadly. ?Epidural Infections-This can be in the form of an epidural abscess, which can cause pressure inside of the spine, causing compression of the spinal cord with subsequent paralysis. This would require an emergency surgery to decompress, and there are no guarantees that the patient would recover from the paralysis. ?Discitis-This is an infection of the intervertebral discs.  It occurs in about 1% of discography procedures.  It is difficult to treat and it may lead to surgery. ? ?      2. Pain: the needles have to go through skin and soft tissues, will cause soreness. ?      3. Damage to internal structures:  The nerves to be lesioned may be near blood vessels or   ? other nerves which can be potentially damaged. ?      4. Bleeding: Bleeding is more common if the patient is taking blood thinners such as  aspirin, Coumadin, Ticiid, Plavix, etc., or if he/she have some genetic predisposition  such as hemophilia. Bleeding into the spinal canal can cause compression of the spinal  cord with subsequent  paralysis.  This would require an emergency surgery to  decompress and there are no guarantees that the patient would recover from the  paralysis. ?      5. Pneumothorax:  Puncturing of a lung is a possibility, every time a needle is introduced in  the area of the chest or upper back.  Pneumothorax refers to free air around the  collapsed lung(s), inside of the thoracic cavity (chest cavity).  Another two possible  complications related to a similar event would include: Hemothorax and Chylothorax.   These are variations of the Pneumothorax, where instead of air around the collapsed  lung(s), you may have blood or chyle, respectively. ?      6. Spinal headaches: They may occur with any procedures in the area of the spine. ?      7. Persistent CSF (Cerebro-Spinal Fluid) leakage: This is a rare problem, but may occur  with prolonged intrathecal or epidural catheters either due to the formation of a fistulous  track or a dural tear. ?      8. Nerve damage: By working so close to the spinal cord, there is always a possibility of  nerve damage, which could be as serious as a permanent spinal cord injury with  paralysis. ?      9. Death:  Although rare, severe deadly allergic reactions known as "Anaphylactic  reaction" can occur to any of the medications used. ?     10. Worsening of the symptoms:  We can  always make thing worse. ? ?What are the chances of something like this happening? ?Chances of any of this occuring are extremely low.  By statistics, you have more of a chance of getting killed in a motor vehicle accident: while driving to the hospital than any of the above occurring .  Nevertheless, you should be aware that they are possibilities.  In general, it is similar to taking a shower.  Everybody knows that you can slip, hit your head and get killed.  Does that mean that you should not shower again?  Nevertheless always keep in mind that statistics do not mean anything if you happen to be on the wrong side of  them.  Even if a procedure has a 1 (one) in a 1,000,000 (million) chance of going wrong, it you happen to be that one..Also, keep in mind that by statistics, you have more of a chance of having something go wrong when taking medications. ? ?Who should not have this procedure? ?If you are on a blood thinning medication (e.g. Coumadin, Plavix, see list of "Blood Thinners"), or if you have an active infection going on, you should not have the procedure.  If you are taking any blood thinners, please inform your physician. ? ?How should I prepare for this procedure? ?Do not eat or drink anything at least six hours prior to the procedure. ?Bring a driver with you .  It cannot be a taxi. ?Come accompanied by an adult that can drive you back, and that is strong enough to help you if your legs get weak or numb from the local anesthetic. ?Take all of your medicines the morning of the procedure with just enough water to swallow them. ?If you have diabetes, make sure that you are scheduled to have your procedure done first thing in the morning, whenever possible. ?If you have diabetes, take only half of your insulin dose and notify our nurse that you have done so as soon as you arrive at the clinic. ?If you are diabetic, but only take blood sugar pills (oral hypoglycemic), then do not take them on the morning of your procedure.  You may take them after you have had the procedure. ?Do not take aspirin or any aspirin-containing medications, at least eleven (11) days prior to the procedure.  They may prolong bleeding. ?Wear loose fitting clothing that may be easy to take off and that you would not mind if it got stained with Betadine or blood. ?Do not wear any jewelry or perfume ?Remove any nail coloring.  It will interfere with some of our monitoring equipment. ? ?NOTE: Remember that this is not meant to be interpreted as a complete list of all possible complications.  Unforeseen problems may occur. ? ?BLOOD THINNERS ?The  following drugs contain aspirin or other products, which can cause increased bleeding during surgery and should not be taken for 2 weeks prior to and 1 week after surgery.  If you should need take something for relief of minor pain, you may take acetaminophen which is found in Tylenol,m Datril, Anacin-3 and Panadol. It is not blood thinner. The products listed below are.  Do not take any of the products listed below in addition to any listed on your instruction sheet. ? ?A.P.C or A.P.C with Codeine Codeine Phosphate Capsules #3 Ibuprofen Ridaura  ?ABC compound Congesprin Imuran rimadil  ?Advil Cope Indocin Robaxisal  ?Alka-Seltzer Effervescent Pain Reliever and Antacid Coricidin or Coricidin-D ? Indomethacin Rufen  ?Alka-Seltzer plus Cold Medicine Cosprin Ketoprofen  S-A-C Tablets  ?Anacin Analgesic Tablets or Capsules Coumadin Korlgesic Salflex  ?Anacin Extra Strength Analgesic tablets or capsules CP-2 Tablets Lanoril Salicylate  ?Anaprox Cuprimine Capsules Levenox Salocol  ?Anexsia-D Dalteparin Magan Salsalate  ?Anodynos Darvon compound Magnesium Salicylate Sine-off  ?Ansaid Dasin Capsules Magsal Sodium Salicylate  ?Anturane Depen Capsules Marnal Soma  ?APF Arthritis pain formula Dewitt's Pills Measurin Stanback  ?Argesic Dia-Gesic Meclofenamic Sulfinpyrazone  ?Arthritis Bayer Timed Release Aspirin Diclofenac Meclomen Sulindac  ?Arthritis pain formula Anacin Dicumarol Medipren Supac  ?Analgesic (Safety coated) Arthralgen Diffunasal Mefanamic Suprofen  ?Arthritis Strength Bufferin Dihydrocodeine Mepro Compound Suprol  ?Arthropan liquid Dopirydamole Methcarbomol with Aspirin Synalgos  ?ASA tablets/Enseals Disalcid Micrainin Tagament  ?Ascriptin Doan's Midol Talwin  ?Ascriptin A/D Dolene Mobidin Tanderil  ?Ascriptin Extra Strength Dolobid Moblgesic Ticlid  ?Ascriptin with Codeine Doloprin or Doloprin with Codeine Momentum Tolectin  ?Asperbuf Duoprin Mono-gesic Trendar  ?Aspergum Duradyne Motrin or Motrin IB Triminicin   ?Aspirin plain, buffered or enteric coated Durasal Myochrisine Trigesic  ?Aspirin Suppositories Easprin Nalfon Trillsate  ?Aspirin with Codeine Ecotrin Regular or Extra Strength Naprosyn Uracel  ?Atromid-S Effic

## 2021-07-14 ENCOUNTER — Encounter: Payer: Self-pay | Admitting: Student in an Organized Health Care Education/Training Program

## 2021-07-14 ENCOUNTER — Ambulatory Visit
Admission: RE | Admit: 2021-07-14 | Discharge: 2021-07-14 | Disposition: A | Discharge status: Home / Self Care | Payer: 59 | Source: Ambulatory Visit | Attending: Student in an Organized Health Care Education/Training Program | Admitting: Student in an Organized Health Care Education/Training Program

## 2021-07-14 ENCOUNTER — Ambulatory Visit (HOSPITAL_BASED_OUTPATIENT_CLINIC_OR_DEPARTMENT_OTHER): Payer: 59 | Admitting: Student in an Organized Health Care Education/Training Program

## 2021-07-14 VITALS — BP 172/96 | HR 84 | Temp 98.8°F | Resp 18 | Ht 64.0 in | Wt 250.0 lb

## 2021-07-14 DIAGNOSIS — M5412 Radiculopathy, cervical region: Secondary | ICD-10-CM

## 2021-07-14 DIAGNOSIS — Q761 Klippel-Feil syndrome: Secondary | ICD-10-CM

## 2021-07-14 DIAGNOSIS — G894 Chronic pain syndrome: Secondary | ICD-10-CM

## 2021-07-14 MED ORDER — SODIUM CHLORIDE 0.9% FLUSH
1.0000 mL | Freq: Once | INTRAVENOUS | Status: AC
Start: 2021-07-14 — End: 2021-07-14
  Administered 2021-07-14: 1 mL

## 2021-07-14 MED ORDER — DIAZEPAM 5 MG PO TABS
ORAL_TABLET | ORAL | Status: AC
  Filled 2021-07-14: qty 1

## 2021-07-14 MED ORDER — LIDOCAINE HCL 2 % IJ SOLN
20.0000 mL | Freq: Once | INTRAMUSCULAR | Status: AC
  Administered 2021-07-14: 100 mg
  Filled 2021-07-14: qty 40

## 2021-07-14 MED ORDER — IOHEXOL 180 MG/ML  SOLN
10.0000 mL | Freq: Once | INTRAMUSCULAR | Status: AC
  Administered 2021-07-14: 10 mL via EPIDURAL
  Filled 2021-07-14: qty 20

## 2021-07-14 MED ORDER — ROPIVACAINE HCL 2 MG/ML IJ SOLN
1.0000 mL | Freq: Once | INTRAMUSCULAR | Status: AC
  Administered 2021-07-14: 1 mL via EPIDURAL
  Filled 2021-07-14: qty 20

## 2021-07-14 MED ORDER — DEXAMETHASONE SODIUM PHOSPHATE 10 MG/ML IJ SOLN
10.0000 mg | Freq: Once | INTRAMUSCULAR | Status: AC
  Administered 2021-07-14: 10 mg
  Filled 2021-07-14: qty 1

## 2021-07-14 MED ORDER — DIAZEPAM 5 MG PO TABS
5.0000 mg | ORAL_TABLET | ORAL | Status: AC
  Administered 2021-07-14: 5 mg via ORAL

## 2021-07-14 NOTE — Patient Instructions (Signed)
Pain Management Discharge Instructions  General Discharge Instructions :  If you need to reach your doctor call: Monday-Friday 8:00 am - 4:00 pm at [phone] or toll free [phone].  After clinic hours [phone] to have operator reach doctor.  Bring all of your medication bottles to all your appointments in the pain clinic.  To cancel or reschedule your appointment with Pain Management please remember to call 24 hours in advance to avoid a fee.  Refer to the educational materials which you have been given on: General Risks, I had my Procedure. Discharge Instructions, Post Sedation.  Post Procedure Instructions:  The drugs you were given will stay in your system until tomorrow, so for the next 24 hours you should not drive, make any legal decisions or drink any alcoholic beverages.  You may eat anything you prefer, but it is better to start with liquids then soups and crackers, and gradually work up to solid foods.  Please notify your doctor immediately if you have any unusual bleeding, trouble breathing or pain that is not related to your normal pain.  Depending on the type of procedure that was done, some parts of your body may feel week and/or numb.  This usually clears up by tonight or the next day.  Walk with the use of an assistive device or accompanied by an adult for the 24 hours.  You may use ice on the affected area for the first 24 hours.  Put ice in a Ziploc bag and cover with a towel and place against area 15 minutes on 15 minutes off.  You may switch to heat after 24 hours.Epidural Steroid Injection Patient Information  Description: The epidural space surrounds the nerves as they exit the spinal cord.  In some patients, the nerves can be compressed and inflamed by a bulging disc or a tight spinal canal (spinal stenosis).  By injecting steroids into the epidural space, we can bring irritated nerves into direct contact with a potentially helpful medication.  These  steroids act directly on the irritated nerves and can reduce swelling and inflammation which often leads to decreased pain.  Epidural steroids may be injected anywhere along the spine and from the neck to the low back depending upon the location of your pain.   After numbing the skin with local anesthetic (like Novocaine), a small needle is passed into the epidural space slowly.  You may experience a sensation of pressure while this is being done.  The entire block usually last less than 10 minutes.  Conditions which may be treated by epidural steroids:  Low back and leg pain Neck and arm pain Spinal stenosis Post-laminectomy syndrome Herpes zoster (shingles) pain Pain from compression fractures  Preparation for the injection:  Do not eat any solid food or dairy products within 8 hours of your appointment.  You may drink clear liquids up to 3 hours before appointment.  Clear liquids include water, black coffee, juice or soda.  No milk or cream please. You may take your regular medication, including pain medications, with a sip of water before your appointment  Diabetics should hold regular insulin (if taken separately) and take 1/2 normal NPH dos the morning of the procedure.  Carry some sugar containing items with you to your appointment. A driver must accompany you and be prepared to drive you home after your procedure.  Bring all your current medications with your. An IV may be inserted and sedation may be given at the discretion of the physician.     A blood pressure cuff, EKG and other monitors will often be applied during the procedure.  Some patients may need to have extra oxygen administered for a short period. You will be asked to provide medical information, including your allergies, prior to the procedure.  We must know immediately if you are taking blood thinners (like Coumadin/Warfarin)  Or if you are allergic to IV iodine contrast (dye). We must know if you could possible be  pregnant.  Possible side-effects: Bleeding from needle site Infection (rare, may require surgery) Nerve injury (rare) Numbness & tingling (temporary) Difficulty urinating (rare, temporary) Spinal headache ( a headache worse with upright posture) Light -headedness (temporary) Pain at injection site (several days) Decreased blood pressure (temporary) Weakness in arm/leg (temporary) Pressure sensation in back/neck (temporary)  Call if you experience: Fever/chills associated with headache or increased back/neck pain. Headache worsened by an upright position. New onset weakness or numbness of an extremity below the injection site Hives or difficulty breathing (go to the emergency room) Inflammation or drainage at the infection site Severe back/neck pain Any new symptoms which are concerning to you  Please note:  Although the local anesthetic injected can often make your back or neck feel good for several hours after the injection, the pain will likely return.  It takes 3-7 days for steroids to work in the epidural space.  You may not notice any pain relief for at least that one week.  If effective, we will often do a series of three injections spaced 3-6 weeks apart to maximally decrease your pain.  After the initial series, we generally will wait several months before considering a repeat injection of the same type.  If you have any questions, please call [phone] [facility] 

## 2021-07-14 NOTE — Progress Notes (Signed)
PROVIDER NOTE: Interpretation of information contained herein should be left to medically-trained personnel. Specific patient instructions are provided elsewhere under "Patient Instructions" section of medical record. This document was created in part using STT-dictation technology, any transcriptional errors that may result from this process are unintentional.  ?[name] ?Type: Established ?DOB: June 01, 1965 ?MRN: [id number] ?PCP: [name], DO  Service: Procedure ?DOS: 07/14/2021 ?Setting: Ambulatory ?Location: Ambulatory outpatient facility ?Delivery: Face-to-face Provider: Gillis Santa, MD ?Specialty: Interventional Pain Management ?Specialty designation: 09 ?Location: Outpatient facility ?Ref. Prov.: Kuneff, Renee A, DO   ?Procedure [facility] Interventional Pain Management )  ? Type: Cervical Epidural Steroid injection (ESI) (Interlaminar) #1  ?Laterality: Left  ?Level: C7-T1 ?Imaging: Fluoroscopy-assisted ?DOS: 07/14/2021  ?Performed by: Gillis Santa, MD ?Anesthesia: Local anesthesia (1-2% Lidocaine) ?Anxiolysis: Oral Valium 5 mg ?Sedation: None.  ? ?Purpose: Diagnostic/Therapeutic ?Indications: Cervicalgia, cervical radicular pain, degenerative disc disease, severe enough to impact quality of life or function. ?1. Cervical radiculopathy (left c7/T1)   ?2. Cervical radicular pain   ?3. Cervical fusion syndrome (C4-C5 ACDF)   ?4. Chronic pain syndrome   ? ?NAS-11 score:  ? Pre-procedure: 8 /10  ? Post-procedure: 4 /10  ?  ? ?Patient stopped her Eliquis 3 days before. ? ?Pre-Procedure Preparation  ?Monitoring: As per clinic protocol. Respiration, ETCO2, SpO2, BP, heart rate and rhythm monitor placed and checked for adequate function  ?Risk Assessment: ?Vitals:  GEX:[id number] body mass index is 42.91 kg/m? as calculated from the following: ?  Height as of this encounter: '5\' 4"'$  (1.626 m). ?  Weight as of this encounter: 250 lb (113.4 kg)., Rate:84ECG Heart Rate: 80, BP:(!) 181/88, Resp:16, Temp:98.8 ?F  (37.1 ?C), SpO2:98 %  ?Allergies: She is allergic to bee venom, yellow jacket venom, biaxin [clarithromycin], gabapentin, robaxin [methocarbamol], abilify [aripiprazole], buspar [buspirone], lisinopril, maxzide [triamterene-hctz], phentermine, and seroquel [quetiapine fumarate].  ?Precautions: None required  ?Blood-thinner(s): None at this time  ?Coagulopathies: Reviewed. None identified.   ?Active Infection(s): Reviewed. None identified. [name] is afebrile  ? ?Location setting: Procedure suite ?Position: Prone, on modified reverse trendelenburg to facilitate breathing, with head in head-cradle. Pillows positioned under chest (below chin-level) with cervical spine flexed. ?Safety Precautions: Patient was assessed for positional comfort and pressure points before starting the procedure. ?Prepping solution: DuraPrep (Iodine Povacrylex [0.7% available iodine] and Isopropyl Alcohol, 74% w/w) ?Prep Area: Entire  cervicothoracic region ?Approach: percutaneous, paramedial ?Intended target: Posterior cervical epidural space ?Materials: ?Tray: Epidural ?Needle(s): Epidural (Tuohy) ?Qty: 1 ?Length: (73m) 3.5-inch ?Gauge: 22G  ? ?Meds ordered this encounter  ?Medications  ? iohexol (OMNIPAQUE) 180 MG/ML injection 10 mL  ?  Must be Myelogram-compatible. If not available, you may substitute with a water-soluble, non-ionic, hypoallergenic, myelogram-compatible radiological contrast medium.  ? lidocaine (XYLOCAINE) 2 % (with pres) injection 400 mg  ? diazepam (VALIUM) tablet 5 mg  ?  Make sure Flumazenil is available in the pyxis when using this medication. If oversedation occurs, administer 0.2 mg IV over 15 sec. If after 45 sec no response, administer 0.2 mg again over 1 min; may repeat at 1 min intervals; not to exceed 4 doses (1 mg)  ? ropivacaine (PF) 2 mg/mL (0.2%) (NAROPIN) injection 1 mL  ? sodium chloride flush (NS) 0.9 % injection 1 mL  ? dexamethasone (DECADRON) injection 10 mg  ?  Orders Placed This Encounter   ?Procedures  ? [facility]-ARM 1-60 MIN NO REPORT  ?  Intraoperative interpretation by procedural physician at [facility]  ?  Standing Status:   Standing  ?  Number of Occurrences:   1  ?  Order Specific Question:   Reason for exam:  ?  Answer:   Assistance in needle guidance and placement for procedures requiring needle placement in or near specific anatomical locations not easily accessible without such assistance.  ?  ? ?Time-out: 1333 I initiated and conducted the "Time-out" before starting the procedure, as per protocol. The patient was asked to participate by confirming the accuracy of the "Time Out" information. Verification of the correct person, site, and procedure were performed and confirmed by me, the nursing staff, and the patient. "Time-out" conducted as per Joint Commission's Universal Protocol (UP.01.01.01). ?Procedure checklist: Completed  ? ?H&P (Pre-op  Assessment)  ?[name] is a 56 y.o. (year old), female patient, seen today for interventional treatment. She  has a past surgical history that includes Cervical discectomy (2003); Vaginal hysterectomy (2009); Hand fracture surgery; Lumbar fusion (2016); Cervical fusion (2010); and Breast biopsy (3790,2409). [name] has a current medication list which includes the following prescription(s): albuterol, bupropion, cholecalciferol, clobetasol ointment, duloxetine, estradiol, furosemide, gabapentin, hydrocodone-acetaminophen, levothyroxine, magnesium oxide, meloxicam, metoprolol succinate, potassium chloride er, progesterone, ropinirole, ropinirole, telmisartan, and eliquis. Her primarily concern today is the Back Pain (Left shoulder down left flank to left rib cage) ? She is allergic to bee venom, yellow jacket venom, biaxin [clarithromycin], gabapentin, robaxin [methocarbamol], abilify [aripiprazole], buspar [buspirone], lisinopril, maxzide [triamterene-hctz], phentermine, and seroquel [quetiapine fumarate].  ? ?Last encounter:  My last encounter with her was on 07/09/2021. ?Pertinent problems: Ms. Castiglia does not have any pertinent problems on file. ?Pain Assessment: Severity of Chronic pain is reported as a 8 /10. Location: Back Left, Upper/back of left upper arm, and down left flank to below ribs. Onset: More than a month ago. Quality: Stabbing, Constant. Timing: Constant. Modifying factor(s): vicodin. ?Vitals:  height is '5\' 4"'$  (1.626 m) and weight is 250 lb (113.4 kg). Her temporal temperature is 98.8 ?F (37.1 ?C). Her blood pressure is 172/96 (abnormal) and her pulse is 84. Her respiration is 18 and oxygen saturation is 100%.  ? ?Reason for encounter: Interventional pain management therapy due pain of at least four (4) weeks in duration, with to failure to respond to and/or inability to tolerate more conservative care.  ? ?Site Confirmation: Ms. Kangas was asked to confirm the procedure and laterality before marking the site. ? ?Consent: Before the procedure and under the influence of no sedative(s), amnesic(s), or anxiolytics, the patient was informed of the treatment options, risks and possible complications. To fulfill our ethical and legal obligations, as recommended by the American Medical Association's Code of Ethics, I have informed the patient of my clinical impression; the nature and purpose of the treatment or procedure; the risks, benefits, and possible complications of the intervention; the alternatives, including doing nothing; the risk(s) and benefit(s) of the alternative treatment(s) or procedure(s); and the risk(s) and benefit(s) of doing nothing. ?The patient was provided information about the general risks and possible complications associated with the procedure. These may include, but are not limited to: failure to achieve desired goals, infection, bleeding, organ or nerve damage, allergic reactions, paralysis, and death. ?In addition, the patient was informed of those risks and complications associated to  Spine-related procedures, such as failure to decrease pain; infection (i.e.: Meningitis, epidural or intraspinal abscess); bleeding (i.e.: epidural hematoma, subarachnoid hemorrhage, or any other type of intraspin

## 2021-07-14 NOTE — Progress Notes (Signed)
Safety precautions to be maintained throughout the outpatient stay will include: orient to surroundings, keep bed in low position, maintain call bell within reach at all times, provide assistance with transfer out of bed and ambulation.  

## 2021-07-15 ENCOUNTER — Telehealth: Payer: Self-pay | Admitting: *Deleted

## 2021-07-15 ENCOUNTER — Encounter: Payer: Self-pay | Admitting: Family Medicine

## 2021-07-15 ENCOUNTER — Ambulatory Visit (INDEPENDENT_AMBULATORY_CARE_PROVIDER_SITE_OTHER): Payer: 59 | Admitting: Family Medicine

## 2021-07-15 VITALS — BP 154/82 | HR 91 | Temp 98.7°F | Ht 63.39 in | Wt 263.4 lb

## 2021-07-15 DIAGNOSIS — G2581 Restless legs syndrome: Secondary | ICD-10-CM

## 2021-07-15 DIAGNOSIS — Z1159 Encounter for screening for other viral diseases: Secondary | ICD-10-CM

## 2021-07-15 DIAGNOSIS — Z6841 Body Mass Index (BMI) 40.0 and over, adult: Secondary | ICD-10-CM

## 2021-07-15 DIAGNOSIS — L749 Eccrine sweat disorder, unspecified: Secondary | ICD-10-CM

## 2021-07-15 DIAGNOSIS — F5104 Psychophysiologic insomnia: Secondary | ICD-10-CM

## 2021-07-15 DIAGNOSIS — Z0000 Encounter for general adult medical examination without abnormal findings: Secondary | ICD-10-CM | POA: Diagnosis not present

## 2021-07-15 DIAGNOSIS — Z114 Encounter for screening for human immunodeficiency virus [HIV]: Secondary | ICD-10-CM

## 2021-07-15 DIAGNOSIS — R7309 Other abnormal glucose: Secondary | ICD-10-CM

## 2021-07-15 DIAGNOSIS — Z23 Encounter for immunization: Secondary | ICD-10-CM | POA: Diagnosis not present

## 2021-07-15 DIAGNOSIS — E785 Hyperlipidemia, unspecified: Secondary | ICD-10-CM

## 2021-07-15 DIAGNOSIS — Z79890 Hormone replacement therapy (postmenopausal): Secondary | ICD-10-CM | POA: Diagnosis not present

## 2021-07-15 DIAGNOSIS — E538 Deficiency of other specified B group vitamins: Secondary | ICD-10-CM | POA: Diagnosis not present

## 2021-07-15 DIAGNOSIS — F339 Major depressive disorder, recurrent, unspecified: Secondary | ICD-10-CM | POA: Diagnosis not present

## 2021-07-15 DIAGNOSIS — E559 Vitamin D deficiency, unspecified: Secondary | ICD-10-CM

## 2021-07-15 DIAGNOSIS — M2550 Pain in unspecified joint: Secondary | ICD-10-CM | POA: Diagnosis not present

## 2021-07-15 DIAGNOSIS — I10 Essential (primary) hypertension: Secondary | ICD-10-CM

## 2021-07-15 DIAGNOSIS — D6869 Other thrombophilia: Secondary | ICD-10-CM

## 2021-07-15 DIAGNOSIS — I480 Paroxysmal atrial fibrillation: Secondary | ICD-10-CM

## 2021-07-15 DIAGNOSIS — Z1231 Encounter for screening mammogram for malignant neoplasm of breast: Secondary | ICD-10-CM

## 2021-07-15 DIAGNOSIS — E034 Atrophy of thyroid (acquired): Secondary | ICD-10-CM

## 2021-07-15 MED ORDER — MELOXICAM 7.5 MG PO TABS: 7.5000 mg | ORAL_TABLET | Freq: Every day | ORAL | 1 refills | Status: DC

## 2021-07-15 MED ORDER — ESTRADIOL 2 MG PO TABS: 2.0000 mg | ORAL_TABLET | Freq: Every day | ORAL | 1 refills | Status: DC

## 2021-07-15 MED ORDER — PROGESTERONE MICRONIZED 100 MG PO CAPS: 100.0000 mg | ORAL_CAPSULE | Freq: Every day | ORAL | 1 refills | Status: DC

## 2021-07-15 MED ORDER — TELMISARTAN 80 MG PO TABS: 80.0000 mg | ORAL_TABLET | Freq: Every day | ORAL | 1 refills | Status: DC

## 2021-07-15 MED ORDER — BUPROPION HCL ER (XL) 300 MG PO TB24: 300.0000 mg | ORAL_TABLET | Freq: Every day | ORAL | 1 refills | Status: DC

## 2021-07-15 MED ORDER — METOPROLOL SUCCINATE ER 25 MG PO TB24: 12.5000 mg | ORAL_TABLET | Freq: Every day | ORAL | 1 refills | Status: DC

## 2021-07-15 MED ORDER — ALBUTEROL SULFATE HFA 108 (90 BASE) MCG/ACT IN AERS
2.0000 | INHALATION_SPRAY | Freq: Four times a day (QID) | RESPIRATORY_TRACT | 1 refills | Status: DC | PRN
Start: 2021-07-15 — End: 2022-06-03

## 2021-07-15 MED ORDER — DULOXETINE HCL 60 MG PO CPEP: 60.0000 mg | ORAL_CAPSULE | Freq: Every day | ORAL | 1 refills | Status: DC

## 2021-07-15 NOTE — Progress Notes (Signed)
? ? ?Patient ID: [name] [name], female  DOB: 1966-02-19, 56 y.o.   MRN: [id number] ?Patient Care Team  ?  Relationship Specialty Notifications Start End  ?[name], DO PCP - General Family Medicine  07/07/16   ?Nahser, Wonda Cheng, MD PCP - Cardiology Cardiology Admissions 12/15/18   ?Ob/Gyn, [name]    07/08/16   ?Wilford Corner, MD Consulting Physician Gastroenterology  07/08/16   ?[name], MD (Inactive) Consulting Physician Neurology  07/08/16   ?[name], MD Attending Physician Endocrinology  07/08/16   ? Comment: Pt established with Si Raider, NP at this location. - Integrative med- thyroid  ?[name], MD Consulting Physician Rehabilitation  03/06/19   ? Comment: Neurosurgeon-spine and scoliosis specialist  ? ? ?Chief Complaint  ?Patient presents with  ? Annual Exam  ?  Pt is fasting   ? ? ?Subjective: ? ?[name] [name] is a 56 y.o.  Female  present for CPE/CMC ?All past medical history, surgical history, allergies, family history, immunizations, medications and social history were updated in the electronic medical record today. ?All recent labs, ED visits and hospitalizations within the last year were reviewed. ? ?Health maintenance:  ?Colonoscopy: completed 05/03/2014, by Dr. Michail Sermon,  follow up 10 yr. ?Mammogram: completed:02/03/2021, [facility]> order placed.  ?Cervical cancer screening:hysterectomy ?Immunizations: tdap updated today 07/15/2021, Influenza  (encouraged yearly), covid counseled, shingrix #1 provided today (nurse visit 2-6 mos for #2) ?Infectious disease screening: HIV and Hep C screening ?DEXA: vit d def. Consider early screen. ?Assistive device: none ?Oxygen HOZ:YYQM ?Patient has a Dental home. ?Hospitalizations/ED visits: reviewed ? ? ? ?  07/15/2021  ?  1:54 PM 04/29/2021  ? 10:58 AM 03/18/2021  ?  4:06 PM 10/02/2020  ?  8:07 AM 01/18/2020  ?  9:05 AM  ?Depression screen PHQ 2/9  ?Decreased Interest 1 0 0 1 0  ?Down, Depressed, Hopeless 1 0 0 1 0  ?PHQ - 2 Score 2 0 0 2 0   ?Altered sleeping 3  0 2   ?Tired, decreased energy 3  0 1   ?Change in appetite 3  0 1   ?Feeling bad or failure about yourself    0 0   ?Trouble concentrating 3  0 0   ?Moving slowly or fidgety/restless 0  0 0   ?Suicidal thoughts 0  0 0   ?PHQ-9 Score 14  0 6   ?Difficult doing work/chores   Not difficult at all    ? ? ?  07/15/2021  ?  1:54 PM 10/02/2020  ?  8:07 AM  ?GAD 7 : Generalized Anxiety Score  ?Nervous, Anxious, on Edge 2 3  ?Control/stop worrying 1 0  ?Worry too much - different things 1 0  ?Trouble relaxing 3 3  ?Restless 2 1  ?Easily annoyed or irritable 1 2  ?Afraid - awful might happen 0 0  ?Total GAD 7 Score 10 9  ? ? ? ?Immunization History  ?Administered Date(s) Administered  ? Influenza,inj,Quad PF,6+ Mos 01/25/2018, 12/05/2019  ? Influenza,trivalent, recombinat, inj, PF 12/04/2016  ? Influenza-Unspecified 01/25/2018, 12/05/2019  ? Tdap 07/15/2021  ? Zoster Recombinat (Shingrix) 07/15/2021  ? ? ?Past Medical History:  ?Diagnosis Date  ? Acute bronchitis due to COVID-19 virus 09/06/2020  ? Anxiety 01/12/2013  ? Arthralgia 07/08/2016  ? Asthma   ? Atrial fibrillation ([place]) 12/06/2018  ? B12 deficiency 01/31/2018  ? Chicken pox   ? Chronic insomnia 11/24/2016  ? Clostridium difficile infection 04/02/2014, 02/06/2014  ?  Complex tear of medial meniscus of left knee as current injury 12/01/2018  ? Complex tear of medial meniscus of left knee as current injury 12/01/2018  ? DDD (degenerative disc disease), lumbar 09/09/2018  ? Depression   ? Diabetes mellitus without complication ([place]) 37/03/6965  ? Dyslipidemia   ? Elevated hemoglobin A1c 07/08/2016  ? Estrogen deficiency 12/06/2018  ? Foraminal stenosis of lumbar region 09/09/2018  ? Hair loss 07/20/2017  ? History of lumbar fusion 09/09/2018  ? Hormone replacement therapy (HRT) 07/20/2017  ? HTN (hypertension)   ? Hypertension 07/08/2016  ? Hypokalemia 12/06/2018  ? Hypothyroidism   ? Left medial tibial plateau fracture 11/10/2018  ? Lumbar radiculopathy 09/09/2018  ?  Referred to NS 09/09/2018  ? Migraine   ? Mild memory disturbance 11/13/2013  ? Morbid obesity ([place]) 07/08/2016  ? Multiple allergies   ? Obesity   ? Rash associated with COVID-19 10/02/2020  ? Restless legs syndrome (RLS) 01/12/2013  ? Follows with Dr. Jannifer Franklin who prescribes Requip and gabapentin  ? RLS (restless legs syndrome)   ? Vitamin D deficiency 12/06/2018  ? Wears glasses 07/08/2016  ? ?Allergies  ?Allergen Reactions  ? Bee Venom Swelling and Anaphylaxis  ? Yellow Jacket Venom Swelling  ? Biaxin [Clarithromycin] Diarrhea and Rash  ? Gabapentin Nausea And Vomiting  ?  No reaction to Horizant  ? Robaxin [Methocarbamol] Swelling  ?  Feet, chest pain, headache  ? Abilify [Aripiprazole] Other (See Comments)  ?  MOOD SWING  ? Buspar [Buspirone] Diarrhea  ? Hydrochlorothiazide Rash  ? Lisinopril Cough  ? Maxzide [Triamterene-Hctz] Rash  ? Phentermine Other (See Comments)  ?  MOOD CHANGE ?  ? Quetiapine Other (See Comments)  ?  Felt funny ?MOOD SWINGS  ? Seroquel [Quetiapine Fumarate] Other (See Comments)  ?  MOOD SWINGS  ? ?Past Surgical History:  ?Procedure Laterality Date  ? BREAST BIOPSY  8938,1017  ? x 2 benign lesion  ? CERVICAL DISCECTOMY  2003  ? CERVICAL FUSION  2010  ? x2  ? Hand fracture surgery    ? LUMBAR FUSION  2016  ? VAGINAL HYSTERECTOMY  2009  ? ?Family History  ?Problem Relation Age of Onset  ? Hypertension Father   ? Diabetes Father   ? Heart disease Father   ? Hearing loss Father   ? Diabetes Brother   ? Liver cancer Mother   ? COPD Mother   ? Mental illness Mother   ? Diabetes Mother   ? Hearing loss Mother   ? Heart disease Mother   ? Arthritis/Rheumatoid Mother   ? ?Social History  ? ?Social History Narrative  ? Patient is married [name] [name])  2 children [name] [name] and [name] [name])  ? Patient is right handed.  ? Patient has a college education. 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   ? Patient drinks 1 cups daily. Uses herbal remedies, takes a daily vitamin.  ? Wears her seatbelt, smoke detector in the home.  ? ? ?Allergies  as of 07/15/2021   ? ?   Reactions  ? Bee Venom Swelling, Anaphylaxis  ? Yellow Jacket Venom Swelling  ? Biaxin [clarithromycin] Diarrhea, Rash  ? Gabapentin Nausea And Vomiting  ? No reaction to Horizant  ? Robaxin [methocarbamol] Swelling  ? Feet, chest pain, headache  ? Abilify [aripiprazole] Other (See Comments)  ? MOOD SWING  ? Buspar [buspirone] Diarrhea  ? Hydrochlorothiazide Rash  ? Lisinopril Cough  ? Maxzide [triamterene-hctz] Rash  ? Phentermine Other (See Comments)  ?  MOOD CHANGE  ? Quetiapine Other (See Comments)  ? Felt funny ?MOOD SWINGS  ? Seroquel [quetiapine Fumarate] Other (See Comments)  ? MOOD SWINGS  ? ?  ? ?  ?Medication List  ?  ? ?  ? Accurate as of Jul 15, 2021  3:45 PM. If you have any questions, ask your nurse or doctor.  ?  ?  ? ?  ? ?STOP taking these medications   ? ?furosemide 20 MG tablet ?Commonly known as: LASIX ?Stopped by: Howard Pouch, DO ?  ? ?  ? ?TAKE these medications   ? ?albuterol 108 (90 Base) MCG/ACT inhaler ?Commonly known as: VENTOLIN HFA ?Inhale 2 puffs into the lungs every 6 (six) hours as needed for wheezing or shortness of breath. ?  ?buPROPion 300 MG 24 hr tablet ?Commonly known as: WELLBUTRIN XL ?Take 1 tablet (300 mg total) by mouth daily. ?  ?cholecalciferol 1000 units tablet ?Commonly known as: VITAMIN D ?Take 5,000 Units by mouth daily. ?  ?clobetasol ointment 0.05 % ?Commonly known as: TEMOVATE ?Apply 1 application topically 2 (two) times daily. ?  ?DULoxetine 60 MG capsule ?Commonly known as: CYMBALTA ?Take 1 capsule (60 mg total) by mouth daily. ?  ?Eliquis 5 MG Tabs tablet ?Generic drug: apixaban ?TAKE 1 TABLET BY MOUTH TWICE A DAY ?  ?estradiol 2 MG tablet ?Commonly known as: ESTRACE ?Take 1 tablet (2 mg total) by mouth daily. ?  ?gabapentin 300 MG capsule ?Commonly known as: NEURONTIN ?Take 2 capsules (600 mg total) by mouth 2 (two) times daily. ?  ?HYDROcodone-acetaminophen 5-325 MG tablet ?Commonly known as: NORCO/VICODIN ?Take 1 tablet by mouth every  8 (eight) hours as needed. ?  ?levothyroxine 150 MCG tablet ?Commonly known as: SYNTHROID ?Take 1 tablet (150 mcg total) by mouth daily. ?  ?magnesium oxide 400 MG tablet ?Commonly known as: MAG-OX ?TAKE 1

## 2021-07-15 NOTE — Patient Instructions (Addendum)
Return in about 24 weeks (around 12/30/2021) for Routine chronic condition follow-up. ? ?Meds the same except increase in micardis to 80 mg total.  ? ? ? ? ? ? ?Great to see you today.  ? ? ?If labs were collected, we will inform you of lab results once received either by echart message or telephone call.  ? - echart message- for normal results that have been seen by the patient already.  ? - telephone call: abnormal results or if patient has not viewed results in their echart. ? ? ?Health Maintenance, Female ?Adopting a healthy lifestyle and getting preventive care are important in promoting health and wellness. Ask your health care provider about: ?The right schedule for you to have regular tests and exams. ?Things you can do on your own to prevent diseases and keep yourself healthy. ?What should I know about diet, weight, and exercise? ?Eat a healthy diet ? ?Eat a diet that includes plenty of vegetables, fruits, low-fat dairy products, and lean protein. ?Do not eat a lot of foods that are high in solid fats, added sugars, or sodium. ?Maintain a healthy weight ?Body mass index (BMI) is used to identify weight problems. It estimates body fat based on height and weight. Your health care provider can help determine your BMI and help you achieve or maintain a healthy weight. ?Get regular exercise ?Get regular exercise. This is one of the most important things you can do for your health. Most adults should: ?Exercise for at least 150 minutes each week. The exercise should increase your heart rate and make you sweat (moderate-intensity exercise). ?Do strengthening exercises at least twice a week. This is in addition to the moderate-intensity exercise. ?Spend less time sitting. Even light physical activity can be beneficial. ?Watch cholesterol and blood lipids ?Have your blood tested for lipids and cholesterol at 55 years of age, then have this test every 5 years. ?Have your cholesterol levels checked more often  if: ?Your lipid or cholesterol levels are high. ?You are older than 55 years of age. ?You are at high risk for heart disease. ?What should I know about cancer screening? ?Depending on your health history and family history, you may need to have cancer screening at various ages. This may include screening for: ?Breast cancer. ?Cervical cancer. ?Colorectal cancer. ?Skin cancer. ?Lung cancer. ?What should I know about heart disease, diabetes, and high blood pressure? ?Blood pressure and heart disease ?High blood pressure causes heart disease and increases the risk of stroke. This is more likely to develop in people who have high blood pressure readings or are overweight. ?Have your blood pressure checked: ?Every 3-5 years if you are 23-29 years of age. ?Every year if you are 12 years old or older. ?Diabetes ?Have regular diabetes screenings. This checks your fasting blood sugar level. Have the screening done: ?Once every three years after age 49 if you are at a normal weight and have a low risk for diabetes. ?More often and at a younger age if you are overweight or have a high risk for diabetes. ?What should I know about preventing infection? ?Hepatitis B ?If you have a higher risk for hepatitis B, you should be screened for this virus. Talk with your health care provider to find out if you are at risk for hepatitis B infection. ?Hepatitis C ?Testing is recommended for: ?Everyone born from [birth year] through 1964. ?Anyone with known risk factors for hepatitis C. ?Sexually transmitted infections (STIs) ?Get screened for STIs, including gonorrhea and chlamydia, if: ?  You are sexually active and are younger than 55 years of age. ?You are older than 55 years of age and your health care provider tells you that you are at risk for this type of infection. ?Your sexual activity has changed since you were last screened, and you are at increased risk for chlamydia or gonorrhea. Ask your health care provider if you are at risk. ?Ask  your health care provider about whether you are at high risk for HIV. Your health care provider may recommend a prescription medicine to help prevent HIV infection. If you choose to take medicine to prevent HIV, you should first get tested for HIV. You should then be tested every 3 months for as long as you are taking the medicine. ?Pregnancy ?If you are about to stop having your period (premenopausal) and you may become pregnant, seek counseling before you get pregnant. ?Take 400 to 800 micrograms (mcg) of folic acid every day if you become pregnant. ?Ask for birth control (contraception) if you want to prevent pregnancy. ?Osteoporosis and menopause ?Osteoporosis is a disease in which the bones lose minerals and strength with aging. This can result in bone fractures. If you are 29 years old or older, or if you are at risk for osteoporosis and fractures, ask your health care provider if you should: ?Be screened for bone loss. ?Take a calcium or vitamin D supplement to lower your risk of fractures. ?Be given hormone replacement therapy (HRT) to treat symptoms of menopause. ?Follow these instructions at home: ?Alcohol use ?Do not drink alcohol if: ?Your health care provider tells you not to drink. ?You are pregnant, may be pregnant, or are planning to become pregnant. ?If you drink alcohol: ?Limit how much you have to: ?0-1 drink a day. ?Know how much alcohol is in your drink. In the [place] one drink equals one 12 oz bottle of beer (355 mL), one 5 oz glass of wine (148 mL), or one 1? oz glass of hard liquor (44 mL). ?Lifestyle ?Do not use any products that contain nicotine or tobacco. These products include cigarettes, chewing tobacco, and vaping devices, such as e-cigarettes. If you need help quitting, ask your health care provider. ?Do not use street drugs. ?Do not share needles. ?Ask your health care provider for help if you need support or information about quitting drugs. ?General instructions ?Schedule regular  health, dental, and eye exams. ?Stay current with your vaccines. ?Tell your health care provider if: ?You often feel depressed. ?You have ever been abused or do not feel safe at home. ?Summary ?Adopting a healthy lifestyle and getting preventive care are important in promoting health and wellness. ?Follow your health care provider's instructions about healthy diet, exercising, and getting tested or screened for diseases. ?Follow your health care provider's instructions on monitoring your cholesterol and blood pressure. ?This information is not intended to replace advice given to you by your health care provider. Make sure you discuss any questions you have with your health care provider. ?Document Revised: 07/08/2020 Document Reviewed: 07/08/2020 ?Elsevier Patient Education ? [place]. ? ?

## 2021-07-15 NOTE — Telephone Encounter (Signed)
Called for post procedure check. Denies any issues. 

## 2021-07-16 ENCOUNTER — Telehealth: Payer: Self-pay | Admitting: Family Medicine

## 2021-07-16 LAB — MAGNESIUM: Magnesium: 1.7 mg/dL (ref 1.5–2.5)

## 2021-07-16 NOTE — Telephone Encounter (Signed)
Please call patient ?Liver, kidney and thyroid function are normal ?A1c is 5.9, that is this fluid. ?Cholesterol panel overall looks good.  Her LDL/bad cholesterol is 108. ?Iron panel is normal ?Vitamin D levels are normal ?B12 is extremely low at 234 -I would encourage her to start sublingual B12 1000 mcg daily.  This would help with energy and nerve health. ?Her white count is elevated to 17.1, this elevation is from her injection the day prior.  Her other blood cell counts are normal. ?HIV and hepatitis screening negative. ? ?Still waiting on her magnesium level and we need to add a thyroid level.  We will call her when we get these results back ?

## 2021-07-17 ENCOUNTER — Telehealth: Payer: Self-pay | Admitting: Student in an Organized Health Care Education/Training Program

## 2021-07-17 DIAGNOSIS — M5412 Radiculopathy, cervical region: Secondary | ICD-10-CM

## 2021-07-17 MED ORDER — PREDNISONE 20 MG PO TABS: ORAL_TABLET | ORAL | 0 refills | Status: AC

## 2021-07-17 NOTE — Telephone Encounter (Signed)
Patient is calling about increased pain since procedure on Monday. Would like to speak with someone about this.

## 2021-07-17 NOTE — Telephone Encounter (Signed)
Patient had epidural on Monday. States good pain relief for two days but returned to pre procedure during the night on Tuesday. She states she cannot stand the pain and is asking what can be done. She is taking Gabapentin 600 mg BID and Mobic as prescribed. Any suggestions?

## 2021-07-17 NOTE — Telephone Encounter (Signed)
Patient wants the steroid taper. [facility] on file confirmed. Thanks.

## 2021-07-17 NOTE — Addendum Note (Signed)
Addended by: Gillis Santa on: 07/17/2021 11:43 AM   Modules accepted: Orders

## 2021-07-17 NOTE — Telephone Encounter (Signed)
Spoke with pt regarding labs and instructions.   

## 2021-07-18 LAB — CBC WITH DIFFERENTIAL/PLATELET
Absolute Monocytes: 770 cells/uL (ref 200–950)
Basophils Absolute: 34 cells/uL (ref 0–200)
Basophils Relative: 0.2 %
Eosinophils Absolute: 0 cells/uL — ABNORMAL LOW (ref 15–500)
Eosinophils Relative: 0 %
HCT: 44.2 % (ref 35.0–45.0)
Hemoglobin: 14.9 g/dL (ref 11.7–15.5)
Lymphs Abs: 2291 cells/uL (ref 850–3900)
MCH: 31.2 pg (ref 27.0–33.0)
MCHC: 33.7 g/dL (ref 32.0–36.0)
MCV: 92.5 fL (ref 80.0–100.0)
MPV: 10.2 fL (ref 7.5–12.5)
Monocytes Relative: 4.5 %
Neutro Abs: 14005 cells/uL — ABNORMAL HIGH (ref 1500–7800)
Neutrophils Relative %: 81.9 %
Platelets: 315 10*3/uL (ref 140–400)
RBC: 4.78 10*6/uL (ref 3.80–5.10)
RDW: 12.8 % (ref 11.0–15.0)
Total Lymphocyte: 13.4 %
WBC: 17.1 10*3/uL — ABNORMAL HIGH (ref 3.8–10.8)

## 2021-07-18 LAB — THYROID PANEL WITH TSH
Free Thyroxine Index: 3.5 (ref 1.4–3.8)
T3 Uptake: 29 % (ref 22–35)
T4, Total: 12.1 ug/dL — ABNORMAL HIGH (ref 5.1–11.9)
TSH: 3.12 mIU/L

## 2021-07-18 LAB — VITAMIN B12: Vitamin B-12: 234 pg/mL (ref 200–1100)

## 2021-07-18 LAB — HEMOGLOBIN A1C
Hgb A1c MFr Bld: 5.9 % of total Hgb — ABNORMAL HIGH (ref <5.7)
Mean Plasma Glucose: 123 mg/dL
eAG (mmol/L): 6.8 mmol/L

## 2021-07-18 LAB — LIPID PANEL
Cholesterol: 198 mg/dL (ref <200)
HDL: 68 mg/dL (ref >=50)
LDL Cholesterol (Calc): 108 mg/dL (calc) — ABNORMAL HIGH
Non-HDL Cholesterol (Calc): 130 mg/dL (calc) — ABNORMAL HIGH (ref <130)
Total CHOL/HDL Ratio: 2.9 (calc) (ref <5.0)
Triglycerides: 111 mg/dL (ref <150)

## 2021-07-18 LAB — IRON,TIBC AND FERRITIN PANEL
%SAT: 22 % (calc) (ref 16–45)
Ferritin: 136 ng/mL (ref 16–232)
Iron: 84 ug/dL (ref 45–160)
TIBC: 375 mcg/dL (calc) (ref 250–450)

## 2021-07-18 LAB — HEPATITIS C ANTIBODY
Hepatitis C Ab: NONREACTIVE
SIGNAL TO CUT-OFF: 0.08 (ref <1.00)

## 2021-07-18 LAB — VITAMIN D 25 HYDROXY (VIT D DEFICIENCY, FRACTURES): Vit D, 25-Hydroxy: 42 ng/mL (ref 30–100)

## 2021-07-18 LAB — HIV ANTIBODY (ROUTINE TESTING W REFLEX): HIV 1&2 Ab, 4th Generation: NONREACTIVE

## 2021-07-21 ENCOUNTER — Telehealth: Payer: Self-pay | Admitting: Family Medicine

## 2021-07-21 MED ORDER — LEVOTHYROXINE SODIUM 150 MCG PO TABS: 150.0000 ug | ORAL_TABLET | Freq: Every day | ORAL | 3 refills | Status: DC

## 2021-07-21 MED ORDER — MAGNESIUM OXIDE 400 MG PO TABS
1.0000 | ORAL_TABLET | Freq: Every day | ORAL | 3 refills | Status: DC
Start: 2021-07-21 — End: 2022-06-03

## 2021-07-21 NOTE — Telephone Encounter (Signed)
Please call patient Continue magnesium daily Thyroid level normal Refilled both for her;

## 2021-07-21 NOTE — Telephone Encounter (Signed)
Spoke with pt regarding labs and instructions.   

## 2021-07-23 ENCOUNTER — Ambulatory Visit: Payer: 59 | Admitting: Cardiology

## 2021-07-29 ENCOUNTER — Telehealth: Payer: Self-pay | Admitting: Student in an Organized Health Care Education/Training Program

## 2021-07-29 ENCOUNTER — Encounter: Payer: Self-pay | Admitting: Student in an Organized Health Care Education/Training Program

## 2021-07-29 ENCOUNTER — Telehealth: Payer: Self-pay

## 2021-07-29 NOTE — Telephone Encounter (Signed)
Pt stated that she had a procedure on 5/15. Pt said the pain has returned. Pt would like to know what's next? Please call Pt with an update.

## 2021-07-29 NOTE — Telephone Encounter (Signed)
Dr Holley Raring, patinet called and states that she did not get any relief

## 2021-07-29 NOTE — Telephone Encounter (Signed)
Spoke with patient and she states that she did not get any relief from procedure and would like to discuss the next treatment.  I informed her that this is ususally done at the post procedure eval and she did not want to wait until then.  Please advise.

## 2021-07-30 ENCOUNTER — Telehealth: Payer: Self-pay | Admitting: Student in an Organized Health Care Education/Training Program

## 2021-07-30 DIAGNOSIS — G894 Chronic pain syndrome: Secondary | ICD-10-CM

## 2021-07-30 DIAGNOSIS — M5412 Radiculopathy, cervical region: Secondary | ICD-10-CM

## 2021-07-30 DIAGNOSIS — Q761 Klippel-Feil syndrome: Secondary | ICD-10-CM

## 2021-07-30 NOTE — Telephone Encounter (Signed)
Patient will find out who her neurosurgeon is and call us and let us know.  Then Dr Holley Raring will put in a referral to that neurosurgeon.

## 2021-07-30 NOTE — Telephone Encounter (Signed)
Patient called about a neurosurgery referral?  Wants it sent to Dr. Deetta Perla at [facility] phone [phone], fax [phone]. Please ask Dr. Holley Raring to put this in and I will get it sent out.

## 2021-08-05 ENCOUNTER — Ambulatory Visit: Payer: Self-pay

## 2021-08-05 ENCOUNTER — Encounter: Payer: Self-pay | Admitting: Family Medicine

## 2021-08-05 ENCOUNTER — Ambulatory Visit (INDEPENDENT_AMBULATORY_CARE_PROVIDER_SITE_OTHER): Payer: 59 | Admitting: Family Medicine

## 2021-08-05 VITALS — BP 190/88 | Ht 63.39 in | Wt 263.0 lb

## 2021-08-05 DIAGNOSIS — M5412 Radiculopathy, cervical region: Secondary | ICD-10-CM

## 2021-08-05 DIAGNOSIS — M1712 Unilateral primary osteoarthritis, left knee: Secondary | ICD-10-CM | POA: Insufficient documentation

## 2021-08-05 DIAGNOSIS — M179 Osteoarthritis of knee, unspecified: Secondary | ICD-10-CM | POA: Insufficient documentation

## 2021-08-05 MED ORDER — HYDROCODONE-ACETAMINOPHEN 5-325 MG PO TABS: 1.0000 | ORAL_TABLET | Freq: Three times a day (TID) | ORAL | 0 refills | Status: DC | PRN

## 2021-08-05 MED ORDER — KETOROLAC TROMETHAMINE 30 MG/ML IJ SOLN
30.0000 mg | Freq: Once | INTRAMUSCULAR | Status: AC
  Administered 2021-08-05: 30 mg via INTRA_ARTICULAR

## 2021-08-05 NOTE — Assessment & Plan Note (Signed)
Acutely occurring.  Previous MRI from a few years ago showing the degenerative changes.  Has tried steroid injections in the past with limited improvement. -Counseled on home exercise therapy and supportive care. -Aspiration injection today. -Pursue Zilretta.

## 2021-08-05 NOTE — Progress Notes (Signed)
[name] [name] - 56 y.o. female MRN [id number]  Date of birth: [birth year]  SUBJECTIVE:  Including CC & ROS.  No chief complaint on file.   [name] [name] is a 56 y.o. female that is following up for her radicular pain as well as acute on chronic left knee pain.  She received an epidural and did receive pain relief with the anesthetic.  The pain did return and is having significant pain in the middle of the back as well as around the axilla.  Acute on chronic left knee pain with limited range of motion.  She was tried steroid injections in the past as well as physical therapy.    Review of Systems See HPI   HISTORY: Past Medical, Surgical, Social, and Family History Reviewed & Updated per EMR.   Pertinent Historical Findings include:  Past Medical History:  Diagnosis Date   Acute bronchitis due to COVID-19 virus 09/06/2020   Anxiety 01/12/2013   Arthralgia 07/08/2016   Asthma    Atrial fibrillation ([place]) 12/06/2018   B12 deficiency 01/31/2018   Chicken pox    Chronic insomnia 11/24/2016   Clostridium difficile infection 04/02/2014, 02/06/2014   Complex tear of medial meniscus of left knee as current injury 12/01/2018   Complex tear of medial meniscus of left knee as current injury 12/01/2018   DDD (degenerative disc disease), lumbar 09/09/2018   Depression    Diabetes mellitus without complication ([place]) 20/05/5595   Dyslipidemia    Elevated hemoglobin A1c 07/08/2016   Estrogen deficiency 12/06/2018   Foraminal stenosis of lumbar region 09/09/2018   Hair loss 07/20/2017   History of lumbar fusion 09/09/2018   Hormone replacement therapy (HRT) 07/20/2017   HTN (hypertension)    Hypertension 07/08/2016   Hypokalemia 12/06/2018   Hypothyroidism    Left medial tibial plateau fracture 11/10/2018   Lumbar radiculopathy 09/09/2018   Referred to NS 09/09/2018   Migraine    Mild memory disturbance 11/13/2013   Morbid obesity ([place]) 07/08/2016   Multiple allergies    Obesity    Rash associated with  COVID-19 10/02/2020   Restless legs syndrome (RLS) 01/12/2013   Follows with Dr. Jannifer [name] who prescribes Requip and gabapentin   RLS (restless legs syndrome)    Vitamin D deficiency 12/06/2018   Wears glasses 07/08/2016    Past Surgical History:  Procedure Laterality Date   BREAST BIOPSY  [id number]   x 2 benign lesion   CERVICAL DISCECTOMY  2003   CERVICAL FUSION  2010   x2   Hand fracture surgery     LUMBAR FUSION  2016   VAGINAL HYSTERECTOMY  2009     PHYSICAL EXAM:  VS: BP (!) 190/88 (BP Location: Left Arm, Patient Position: Sitting)   Ht 5' 3.39" (1.61 m)   Wt 263 lb (119.3 kg)   BMI 46.02 kg/m  Physical Exam Gen: NAD, alert, cooperative with exam, well-appearing MSK:  Neurovascularly intact     Aspiration/Injection Procedure Note [name] [name] March 30, 1965  Procedure: Aspiration and Injection Indications: Left knee pain  Procedure Details Consent: Risks of procedure as well as the alternatives and risks of each were explained to the (patient/caregiver).  Consent for procedure obtained. Time Out: Verified patient identification, verified procedure, site/side was marked, verified correct patient position, special equipment/implants available, medications/allergies/relevent history reviewed, required imaging and test results available.  Performed.  The area was cleaned with iodine and alcohol swabs.    The left knee superior lateral suprapatellar pouch was injected using 3  cc of 1% lidocaine on a 22-gauge 1-1/2 inch needle.  An 18-gauge 1-1/2 inch needle was used to achieve aspiration.  The syringe was switched and a mixture containing 1 cc's of 30 mg Toradol and 4 cc's of 0.5% bupivacaine was injected.  Ultrasound was used. Images were obtained in long views showing the injection.     A sterile dressing was applied.  Patient did tolerate procedure well.     ASSESSMENT & PLAN:   Cervical radiculopathy Acutely worsening.  She did get relief with the epidural for a few  hours.  Pain has returned and is severe in nature.  Has a history of previous cervical surgery. -Counseled on home exercise therapy and supportive care. -Norco. -Can consider referral to orthopedic back surgeon.  Primary osteoarthritis of left knee Acutely occurring.  Previous MRI from a few years ago showing the degenerative changes.  Has tried steroid injections in the past with limited improvement. -Counseled on home exercise therapy and supportive care. -Aspiration injection today. -Pursue Zilretta.

## 2021-08-05 NOTE — Patient Instructions (Signed)
Good to see you  Please use heat as needed I will check about the referral  Please send me a message in MyChart with any questions or updates.  We will call when the zilretta is in.   --Dr. Raeford Razor

## 2021-08-05 NOTE — Addendum Note (Signed)
Addended by: Clearance Coots E on: 08/05/2021 12:09 PM   Modules accepted: Orders

## 2021-08-05 NOTE — Assessment & Plan Note (Signed)
Acutely worsening.  She did get relief with the epidural for a few hours.  Pain has returned and is severe in nature.  Has a history of previous cervical surgery. -Counseled on home exercise therapy and supportive care. -Norco. -Can consider referral to orthopedic back surgeon.

## 2021-08-07 ENCOUNTER — Telehealth: Payer: Self-pay | Admitting: *Deleted

## 2021-08-07 NOTE — Telephone Encounter (Signed)
Pt informed of below.  Zilretta left knee injection, OV and procedure are all covered at 100%. She will schedule OV week of 08/11/21 for injection.

## 2021-08-11 ENCOUNTER — Ambulatory Visit (INDEPENDENT_AMBULATORY_CARE_PROVIDER_SITE_OTHER): Payer: 59 | Admitting: Family Medicine

## 2021-08-11 ENCOUNTER — Telehealth: Payer: 59 | Admitting: Student in an Organized Health Care Education/Training Program

## 2021-08-11 ENCOUNTER — Ambulatory Visit: Payer: Self-pay

## 2021-08-11 VITALS — Ht 63.39 in | Wt 263.0 lb

## 2021-08-11 DIAGNOSIS — M1712 Unilateral primary osteoarthritis, left knee: Secondary | ICD-10-CM | POA: Diagnosis not present

## 2021-08-11 NOTE — Patient Instructions (Signed)
Good to see you Please use ice as needed   Please send me a message in MyChart with any questions or updates.  Please see me back in 4 weeks or as needed if better.   --Dr. Catrina Fellenz  

## 2021-08-11 NOTE — Progress Notes (Addendum)
  [name] [name] - 56 y.o. female MRN [id number]  Date of birth: 03-May-1965  SUBJECTIVE:  Including CC & ROS.  No chief complaint on file.   [name] [name] is a 56 y.o. female that is here for a Zilretta injection.   Review of Systems See HPI   HISTORY: Past Medical, Surgical, Social, and Family History Reviewed & Updated per EMR.   Pertinent Historical Findings include:  Past Medical History:  Diagnosis Date   Acute bronchitis due to COVID-19 virus 09/06/2020   Anxiety 01/12/2013   Arthralgia 07/08/2016   Asthma    Atrial fibrillation (HCC) 12/06/2018   B12 deficiency 01/31/2018   Chicken pox    Chronic insomnia 11/24/2016   Clostridium difficile infection 04/02/2014, 02/06/2014   Complex tear of medial meniscus of left knee as current injury 12/01/2018   Complex tear of medial meniscus of left knee as current injury 12/01/2018   DDD (degenerative disc disease), lumbar 09/09/2018   Depression    Diabetes mellitus without complication (HCC) 12/07/2018   Dyslipidemia    Elevated hemoglobin A1c 07/08/2016   Estrogen deficiency 12/06/2018   Foraminal stenosis of lumbar region 09/09/2018   Hair loss 07/20/2017   History of lumbar fusion 09/09/2018   Hormone replacement therapy (HRT) 07/20/2017   HTN (hypertension)    Hypertension 07/08/2016   Hypokalemia 12/06/2018   Hypothyroidism    Left medial tibial plateau fracture 11/10/2018   Lumbar radiculopathy 09/09/2018   Referred to NS 09/09/2018   Migraine    Mild memory disturbance 11/13/2013   Morbid obesity (HCC) 07/08/2016   Multiple allergies    Obesity    Rash associated with COVID-19 10/02/2020   Restless legs syndrome (RLS) 01/12/2013   Follows with Dr. Anne Hahn who prescribes Requip and gabapentin   RLS (restless legs syndrome)    Vitamin D deficiency 12/06/2018   Wears glasses 07/08/2016    Past Surgical History:  Procedure Laterality Date   BREAST BIOPSY  1324,4010   x 2 benign lesion   CERVICAL DISCECTOMY  2003   CERVICAL FUSION  2010    x2   Hand fracture surgery     LUMBAR FUSION  2016   VAGINAL HYSTERECTOMY  2009     PHYSICAL EXAM:  VS: Ht 5' 3.39" (1.61 m)   Wt 263 lb (119.3 kg)   BMI 46.02 kg/m  Physical Exam Gen: NAD, alert, cooperative with exam, well-appearing MSK:  Neurovascularly intact     Aspiration/Injection Procedure Note [name] [name] Mar 29, 1965  Procedure: Injection Indications: left knee pain  Procedure Details Consent: Risks of procedure as well as the alternatives and risks of each were explained to the (patient/caregiver).  Consent for procedure obtained. Time Out: Verified patient identification, verified procedure, site/side was marked, verified correct patient position, special equipment/implants available, medications/allergies/relevent history reviewed, required imaging and test results available.  Performed.  The area was cleaned with iodine and alcohol swabs.    The left knee superior lateral suprapatellar pouch was injected using 3 cc of 1% lidocaine on a 21-gauge 2 inch needle.   The syringe was switched and a mixture containing 5 cc's of 32 mg Zilretta and 4 cc's of 0.25% bupivacaine was injected.  Ultrasound was used. Images were obtained in long views showing the injection.    A sterile dressing was applied.  Patient did tolerate procedure well.       ASSESSMENT & PLAN:   Primary osteoarthritis of left knee Completed zilretta therapy

## 2021-08-11 NOTE — Assessment & Plan Note (Addendum)
Completed zilretta therapy

## 2021-08-19 ENCOUNTER — Encounter: Payer: Self-pay | Admitting: Family Medicine

## 2021-08-19 ENCOUNTER — Other Ambulatory Visit: Payer: Self-pay | Admitting: Family Medicine

## 2021-08-19 DIAGNOSIS — M170 Bilateral primary osteoarthritis of knee: Secondary | ICD-10-CM

## 2021-08-19 MED ORDER — HYDROCODONE-ACETAMINOPHEN 5-325 MG PO TABS: 1.0000 | ORAL_TABLET | Freq: Three times a day (TID) | ORAL | 0 refills | Status: DC | PRN

## 2021-08-19 NOTE — Progress Notes (Signed)
Refilled Norco for severe pain.  Get updated knee x-rays bilaterally.

## 2021-08-20 NOTE — Progress Notes (Signed)
[name] Date of Birth: 1965-05-19  Reason for Visit: Follow up RLS History from: Patient Primary Neurologist: Dr. Julius Bowels. [place]   ASSESSMENT AND PLAN 56 y.o. year old female   33.  Restless leg syndrome  -Under good control -Continue Requip XL 4 mg daily -Continue Requip 2 mg, 1/2 tablet twice a day -Also on gabapentin, dose was increased by orthopedics 600 mg twice daily, has been helpful  -Follow-up in 1 year or sooner if needed  HISTORY OF PRESENT ILLNESS: Today 08/21/21 [name] [name] is here today for follow-up. Iron, TIBC, Ferritin was normal, ferritin was 136 on 07/15/21.  Her dose of gabapentin was increased from 300 mg twice a day to 600 mg twice a day by orthopedics to help with neck pain.  It was helpful.  Wishes to continue this dose.  She has been referred for orthopedic surgical consult for cervical spine.  Has had some knee issues, had knee x-rays today.  Her RLS symptoms are under good control with gabapentin, Requip 2 mg, 1/2 tablet at lunch, 1/2 tablet at bedtime. Requip XL 4 mg in the evening.  HISTORY  08/21/2020 SS: [name] [name] is a 55 year old female with history of RLS.  Currently feels that she has lost the benefit of Horizant.  She actually stopped it a couple months back.  Has been taking gabapentin IR 300 mg twice daily.  Feels this is working better.  Remains on her doses of Requip.  The Horizant was becoming expensive, she can no longer afford.  Denies any changes to her health.  Here today for evaluation via virtual visit.  REVIEW OF SYSTEMS: Out of a complete 14 system review of symptoms, the patient complains only of the following symptoms, and all other reviewed systems are negative.  See HPI  ALLERGIES: Allergies  Allergen Reactions   Bee Venom Swelling and Anaphylaxis   Yellow Jacket Venom Swelling   Biaxin [Clarithromycin] Diarrhea and Rash   Gabapentin Nausea And Vomiting    No reaction to Horizant   Robaxin [Methocarbamol]  Swelling    Feet, chest pain, headache   Abilify [Aripiprazole] Other (See Comments)    MOOD SWING   Buspar [Buspirone] Diarrhea   Hydrochlorothiazide Rash   Lisinopril Cough   Maxzide [Triamterene-Hctz] Rash   Phentermine Other (See Comments)    MOOD CHANGE    Quetiapine Other (See Comments)    Felt funny MOOD SWINGS   Seroquel [Quetiapine Fumarate] Other (See Comments)    MOOD SWINGS    HOME MEDICATIONS: Outpatient Medications Prior to Visit  Medication Sig Dispense Refill   albuterol (VENTOLIN HFA) 108 (90 Base) MCG/ACT inhaler Inhale 2 puffs into the lungs every 6 (six) hours as needed for wheezing or shortness of breath. 6.7 each 1   apixaban (ELIQUIS) 5 MG TABS tablet TAKE 1 TABLET BY MOUTH TWICE A DAY 60 tablet 11   buPROPion (WELLBUTRIN XL) 300 MG 24 hr tablet Take 1 tablet (300 mg total) by mouth daily. 90 tablet 1   cholecalciferol (VITAMIN D) 1000 units tablet Take 5,000 Units by mouth daily.      clobetasol ointment (TEMOVATE) 8.50 % Apply 1 application topically 2 (two) times daily. 45 g 1   DULoxetine (CYMBALTA) 60 MG capsule Take 1 capsule (60 mg total) by mouth daily. 90 capsule 1   estradiol (ESTRACE) 2 MG tablet Take 1 tablet (2 mg total) by mouth daily. 90 tablet 1   gabapentin (NEURONTIN) 300 MG capsule Take 2 capsules (600  mg total) by mouth 2 (two) times daily. 120 capsule 1   HYDROcodone-acetaminophen (NORCO/VICODIN) 5-325 MG tablet Take 1 tablet by mouth every 8 (eight) hours as needed. 15 tablet 0   levothyroxine (SYNTHROID) 150 MCG tablet Take 1 tablet (150 mcg total) by mouth daily. 90 tablet 3   magnesium oxide (MAG-OX) 400 MG tablet Take 1 tablet (400 mg total) by mouth daily. 90 tablet 3   meloxicam (MOBIC) 7.5 MG tablet Take 1 tablet (7.5 mg total) by mouth daily. 90 tablet 1   metoprolol succinate (TOPROL-XL) 25 MG 24 hr tablet Take 0.5 tablets (12.5 mg total) by mouth daily. 45 tablet 1   Potassium Chloride ER 20 MEQ TBCR Take 20 mEq by mouth daily.  90 tablet 3   progesterone (PROMETRIUM) 100 MG capsule Take 1 capsule (100 mg total) by mouth daily. 90 capsule 1   telmisartan (MICARDIS) 80 MG tablet Take 1 tablet (80 mg total) by mouth daily. 90 tablet 1   rOPINIRole (REQUIP XL) 4 MG 24 hr tablet Take 1 tablet (4 mg total) by mouth daily. 90 tablet 3   rOPINIRole (REQUIP) 2 MG tablet Take 1/2 tablet at lunch and 1/2 tablet at bed time. 90 tablet 3   No facility-administered medications prior to visit.    PAST MEDICAL HISTORY: Past Medical History:  Diagnosis Date   Acute bronchitis due to COVID-19 virus 09/06/2020   Anxiety 01/12/2013   Arthralgia 07/08/2016   Asthma    Atrial fibrillation ([place]) 12/06/2018   B12 deficiency 01/31/2018   Chicken pox    Chronic insomnia 11/24/2016   Clostridium difficile infection 04/02/2014, 02/06/2014   Complex tear of medial meniscus of left knee as current injury 12/01/2018   Complex tear of medial meniscus of left knee as current injury 12/01/2018   DDD (degenerative disc disease), lumbar 09/09/2018   Depression    Diabetes mellitus without complication ([place]) 33/04/9516   Dyslipidemia    Elevated hemoglobin A1c 07/08/2016   Estrogen deficiency 12/06/2018   Foraminal stenosis of lumbar region 09/09/2018   Hair loss 07/20/2017   History of lumbar fusion 09/09/2018   Hormone replacement therapy (HRT) 07/20/2017   HTN (hypertension)    Hypertension 07/08/2016   Hypokalemia 12/06/2018   Hypothyroidism    Left medial tibial plateau fracture 11/10/2018   Lumbar radiculopathy 09/09/2018   Referred to NS 09/09/2018   Migraine    Mild memory disturbance 11/13/2013   Morbid obesity ([place]) 07/08/2016   Multiple allergies    Obesity    Rash associated with COVID-19 10/02/2020   Restless legs syndrome (RLS) 01/12/2013   Follows with Dr. Jannifer Franklin who prescribes Requip and gabapentin   RLS (restless legs syndrome)    Vitamin D deficiency 12/06/2018   Wears glasses 07/08/2016    PAST SURGICAL HISTORY: Past Surgical History:   Procedure Laterality Date   BREAST BIOPSY  8416,6063   x 2 benign lesion   CERVICAL DISCECTOMY  2003   CERVICAL FUSION  2010   x2   Hand fracture surgery     LUMBAR FUSION  2016   VAGINAL HYSTERECTOMY  2009    FAMILY HISTORY: Family History  Problem Relation Age of Onset   Hypertension Father    Diabetes Father    Heart disease Father    Hearing loss Father    Diabetes Brother    Liver cancer Mother    COPD Mother    Mental illness Mother    Diabetes Mother    Hearing loss  Mother    Heart disease Mother    Arthritis/Rheumatoid Mother     SOCIAL HISTORY: Social History   Socioeconomic History   Marital status: Married    Spouse name: [name]   Number of children: 2   Years of education: COLLEGE   Highest education level: Associate degree: academic program  Occupational History   Occupation: OWNER    Employer: [employer]  Tobacco Use   Smoking status: Never    Passive exposure: Never   Smokeless tobacco: Never  Vaping Use   Vaping Use: Never used  Substance and Sexual Activity   Alcohol use: No    Alcohol/week: 0.0 standard drinks of alcohol   Drug use: No   Sexual activity: Yes    Partners: Male    Birth control/protection: None  Other Topics Concern   Not on file  Social History Narrative   Patient is married [name])  2 children [name] and [place])   Patient is right handed.   Patient has a college education. 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    Patient drinks 1 cups daily. Uses herbal remedies, takes a daily vitamin.   Wears her seatbelt, smoke detector in the home.   Social Determinants of Health   Financial Resource Strain: Low Risk  (08/01/2020)   Overall Financial Resource Strain (CARDIA)    Difficulty of Paying Living Expenses: Not hard at all  Food Insecurity: No Food Insecurity (08/01/2020)   Hunger Vital Sign    Worried About Running Out of Food in the Last Year: Never true    Ran Out of Food in the Last Year: Never true  Transportation Needs:  No Transportation Needs (08/01/2020)   PRAPARE - Hydrologist (Medical): No    Lack of Transportation (Non-Medical): No  Physical Activity: Unknown (08/01/2020)   Exercise Vital Sign    Days of Exercise per Week: 0 days    Minutes of Exercise per Session: Not on file  Stress: No Stress Concern Present (08/01/2020)   [place]    Feeling of Stress : Not at all  Social Connections: [place] (08/01/2020)   Social Connection and Isolation Panel [NHANES]    Frequency of Communication with Friends and Family: More than three times a week    Frequency of Social Gatherings with Friends and Family: More than three times a week    Attends Religious Services: More than 4 times per year    Active Member of [employer] or Organizations: Yes    Attends Music therapist: More than 4 times per year    Marital Status: Married  Human resources officer Violence: Not on file    PHYSICAL EXAM  Vitals:   08/21/21 1520  BP: (!) 158/92  Pulse: 78  Weight: 261 lb (118.4 kg)  Height: '5\' 3"'$  (1.6 m)   Body mass index is 46.23 kg/m.  Generalized: Well developed, in no acute distress  Neurological examination  Mentation: Alert oriented to time, place, history taking. Follows all commands speech and language fluent Cranial nerve II-XII: Pupils were equal round reactive to light. Extraocular movements were full, visual field were full on confrontational test. Facial sensation and strength were normal.  Head turning and shoulder shrug  were normal and symmetric. Motor: The motor testing reveals 5 over 5 strength of all 4 extremities. Good symmetric motor tone is noted throughout.  Sensory: Sensory testing is intact to soft touch on all 4 extremities. No  evidence of extinction is noted.  Coordination: Cerebellar testing reveals good finger-nose-finger and heel-to-shin bilaterally.  Gait and station: Gait is  normal.  DIAGNOSTIC DATA (LABS, IMAGING, TESTING) - I reviewed patient records, labs, notes, testing and imaging myself where available.  Lab Results  Component Value Date   WBC 17.1 (H) 07/15/2021   HGB 14.9 07/15/2021   HCT 44.2 07/15/2021   MCV 92.5 07/15/2021   PLT 315 07/15/2021      Component Value Date/Time   NA 141 04/24/2021 1434   K 3.8 04/24/2021 1434   CL 103 04/24/2021 1434   CO2 27 04/24/2021 1434   GLUCOSE 140 (H) 04/24/2021 1434   GLUCOSE 105 (H) 03/20/2021 1312   BUN 14 04/24/2021 1434   CREATININE 0.53 (L) 04/24/2021 1434   CREATININE 0.57 10/01/2020 1620   CALCIUM 9.5 04/24/2021 1434   PROT 6.4 12/06/2018 0919   ALBUMIN 3.9 12/06/2018 0919   AST 11 12/06/2018 0919   ALT 21 12/06/2018 0919   ALKPHOS 75 12/06/2018 0919   BILITOT 0.4 12/06/2018 0919   GFRNONAA >60 03/20/2021 1312   GFRAA 121 01/17/2019 1204   Lab Results  Component Value Date   CHOL 198 07/15/2021   HDL 68 07/15/2021   LDLCALC 108 (H) 07/15/2021   TRIG 111 07/15/2021   CHOLHDL 2.9 07/15/2021   Lab Results  Component Value Date   HGBA1C 5.9 (H) 07/15/2021   Lab Results  Component Value Date   VITAMINB12 234 07/15/2021   Lab Results  Component Value Date   TSH 3.12 07/15/2021    Butler Denmark, AGNP-C, DNP 08/21/2021, 4:09 PM [facility] [address] [place], [place] [address] [phone]

## 2021-08-21 ENCOUNTER — Ambulatory Visit (INDEPENDENT_AMBULATORY_CARE_PROVIDER_SITE_OTHER): Payer: 59 | Admitting: Neurology

## 2021-08-21 ENCOUNTER — Encounter: Payer: Self-pay | Admitting: Neurology

## 2021-08-21 ENCOUNTER — Ambulatory Visit (HOSPITAL_BASED_OUTPATIENT_CLINIC_OR_DEPARTMENT_OTHER)
Admission: RE | Admit: 2021-08-21 | Discharge: 2021-08-21 | Disposition: A | Discharge status: Home / Self Care | Payer: 59 | Source: Ambulatory Visit | Attending: Family Medicine | Admitting: Family Medicine

## 2021-08-21 VITALS — BP 158/92 | HR 78 | Ht 63.0 in | Wt 261.0 lb

## 2021-08-21 DIAGNOSIS — M170 Bilateral primary osteoarthritis of knee: Secondary | ICD-10-CM | POA: Diagnosis present

## 2021-08-21 DIAGNOSIS — G2581 Restless legs syndrome: Secondary | ICD-10-CM | POA: Diagnosis not present

## 2021-08-21 MED ORDER — ROPINIROLE HCL ER 4 MG PO TB24: 4.0000 mg | ORAL_TABLET | Freq: Every day | ORAL | 3 refills | Status: DC

## 2021-08-21 MED ORDER — ROPINIROLE HCL 2 MG PO TABS: ORAL_TABLET | ORAL | 3 refills | Status: DC

## 2021-08-22 ENCOUNTER — Telehealth: Payer: Self-pay | Admitting: Family Medicine

## 2021-09-03 ENCOUNTER — Encounter: Payer: Self-pay | Admitting: Family Medicine

## 2021-09-03 ENCOUNTER — Other Ambulatory Visit: Payer: Self-pay | Admitting: Family Medicine

## 2021-09-03 MED ORDER — HYDROCODONE-ACETAMINOPHEN 5-325 MG PO TABS: 1.0000 | ORAL_TABLET | Freq: Three times a day (TID) | ORAL | 0 refills | Status: DC | PRN

## 2021-09-03 NOTE — Progress Notes (Signed)
Provided refill norco.   Rosemarie Ax, MD [facility] 09/03/2021, 1:26 PM

## 2021-09-04 ENCOUNTER — Ambulatory Visit: Payer: 59 | Admitting: Family Medicine

## 2021-09-09 ENCOUNTER — Ambulatory Visit (INDEPENDENT_AMBULATORY_CARE_PROVIDER_SITE_OTHER): Payer: 59 | Admitting: Orthopaedic Surgery

## 2021-09-09 ENCOUNTER — Telehealth: Payer: Self-pay

## 2021-09-09 DIAGNOSIS — M5416 Radiculopathy, lumbar region: Secondary | ICD-10-CM | POA: Diagnosis not present

## 2021-09-09 DIAGNOSIS — Z6841 Body Mass Index (BMI) 40.0 and over, adult: Secondary | ICD-10-CM

## 2021-09-09 DIAGNOSIS — M542 Cervicalgia: Secondary | ICD-10-CM

## 2021-09-09 DIAGNOSIS — Z981 Arthrodesis status: Secondary | ICD-10-CM

## 2021-09-09 NOTE — Progress Notes (Unsigned)
Office Visit Note   [name]           Date of Birth: [birth year]           MRN: [id number] Visit Date: 09/09/2021              Requested by: Rosemarie Ax, MD [place],  [place] [address] PCP: [name], DO   Assessment & Plan: Visit Diagnoses: No diagnosis found.  Plan: ***  Follow-Up Instructions: No follow-ups on file.   Orders:  No orders of the defined types were placed in this encounter.  No orders of the defined types were placed in this encounter.     Procedures: No procedures performed   Clinical Data: No additional findings.   Subjective: Chief Complaint  Patient presents with   Neck - Pain    HPI  Review of Systems   Objective: Vital Signs: BP (!) 163/92   Pulse 84   Ht '5\' 3"'$  (1.6 m)   Wt 261 lb (118.4 kg)   BMI 46.23 kg/m   Physical Exam  Ortho Exam  Specialty Comments:  No specialty comments available.  Imaging: No results found.   PMFS History: Patient Active Problem List   Diagnosis Date Noted   Primary osteoarthritis of left knee 08/05/2021   Cervical fusion syndrome 07/09/2021   Chronic pain syndrome 07/09/2021   Myofascial pain syndrome of thoracic spine 05/29/2021   Cervical radiculopathy 05/29/2021   Chicken pox 04/21/2021   Asthma 04/21/2021   Stress fracture of rib 04/15/2021   Scapular dysfunction 04/02/2021   Effusion, left shoulder 03/26/2021   AC joint derangement 03/26/2021   BMI 45.0-49.9, adult ([place]) 03/26/2021   Neuropathy 02/18/2021   Multiple allergies 10/02/2020   Acquired thrombophilia (HCC)-eliquis 12/20/2019   Hypokalemia 12/06/2018   Atrial fibrillation ([place]) 12/06/2018   Vitamin D deficiency 12/06/2018   Estrogen deficiency 12/06/2018   Clostridium difficile infection 12/01/2018   DDD (degenerative disc disease), lumbar 09/09/2018   Foraminal stenosis of lumbar region 09/09/2018   History of lumbar fusion 09/09/2018   Lumbar radiculopathy  09/09/2018   B12 deficiency 01/31/2018   Hormone replacement therapy (HRT) 07/20/2017   Chronic insomnia 11/24/2016   Elevated hemoglobin A1c 07/08/2016   Hypertension 07/08/2016   Arthralgia 07/08/2016   Morbid obesity ([place]) 07/08/2016   Hypothyroidism    Dyslipidemia    Depression    Migraine 11/13/2013   Restless legs syndrome (RLS) 01/12/2013   Anxiety 01/12/2013   Past Medical History:  Diagnosis Date   Acute bronchitis due to COVID-19 virus 09/06/2020   Anxiety 01/12/2013   Arthralgia 07/08/2016   Asthma    Atrial fibrillation ([place]) 12/06/2018   B12 deficiency 01/31/2018   Chicken pox    Chronic insomnia 11/24/2016   Clostridium difficile infection 04/02/2014, 02/06/2014   Complex tear of medial meniscus of left knee as current injury 12/01/2018   Complex tear of medial meniscus of left knee as current injury 12/01/2018   DDD (degenerative disc disease), lumbar 09/09/2018   Depression    Diabetes mellitus without complication ([place]) 68/05/4194   Dyslipidemia    Elevated hemoglobin A1c 07/08/2016   Estrogen deficiency 12/06/2018   Foraminal stenosis of lumbar region 09/09/2018   Hair loss 07/20/2017   History of lumbar fusion 09/09/2018   Hormone replacement therapy (HRT) 07/20/2017   HTN (hypertension)    Hypertension 07/08/2016   Hypokalemia 12/06/2018   Hypothyroidism    Left medial tibial  plateau fracture 11/10/2018   Lumbar radiculopathy 09/09/2018   Referred to NS 09/09/2018   Migraine    Mild memory disturbance 11/13/2013   Morbid obesity ([place]) 07/08/2016   Multiple allergies    Obesity    Rash associated with COVID-19 10/02/2020   Restless legs syndrome (RLS) 01/12/2013   Follows with Dr. Jannifer Franklin who prescribes Requip and gabapentin   RLS (restless legs syndrome)    Vitamin D deficiency 12/06/2018   Wears glasses 07/08/2016    Family History  Problem Relation Age of Onset   Hypertension Father    Diabetes Father    Heart disease Father    Hearing loss Father    Diabetes  Brother    Liver cancer Mother    COPD Mother    Mental illness Mother    Diabetes Mother    Hearing loss Mother    Heart disease Mother    Arthritis/Rheumatoid Mother     Past Surgical History:  Procedure Laterality Date   BREAST BIOPSY  [phone]   x 2 benign lesion   CERVICAL DISCECTOMY  2003   CERVICAL FUSION  2010   x2   Hand fracture surgery     LUMBAR FUSION  2016   VAGINAL HYSTERECTOMY  2009   Social History   Occupational History   Occupation: OWNER    Employer: [employer]  Tobacco Use   Smoking status: Never    Passive exposure: Never   Smokeless tobacco: Never  Vaping Use   Vaping Use: Never used  Substance and Sexual Activity   Alcohol use: No    Alcohol/week: 0.0 standard drinks of alcohol   Drug use: No   Sexual activity: Yes    Partners: Male    Birth control/protection: None

## 2021-09-09 NOTE — Telephone Encounter (Signed)
Lvm for pt to cb to get her set up with home traction unit that I missed giving her at her appt.

## 2021-09-10 ENCOUNTER — Ambulatory Visit: Payer: 59

## 2021-09-10 ENCOUNTER — Ambulatory Visit (INDEPENDENT_AMBULATORY_CARE_PROVIDER_SITE_OTHER): Payer: 59 | Admitting: Family Medicine

## 2021-09-10 ENCOUNTER — Encounter: Payer: Self-pay | Admitting: Family Medicine

## 2021-09-10 VITALS — BP 129/73 | HR 92 | Temp 98.7°F | Ht 63.0 in | Wt 268.0 lb

## 2021-09-10 DIAGNOSIS — W57XXXA Bitten or stung by nonvenomous insect and other nonvenomous arthropods, initial encounter: Secondary | ICD-10-CM | POA: Diagnosis not present

## 2021-09-10 DIAGNOSIS — L989 Disorder of the skin and subcutaneous tissue, unspecified: Secondary | ICD-10-CM | POA: Diagnosis not present

## 2021-09-10 DIAGNOSIS — S0006XA Insect bite (nonvenomous) of scalp, initial encounter: Secondary | ICD-10-CM | POA: Diagnosis not present

## 2021-09-10 NOTE — Progress Notes (Signed)
[name] [name] , 1965-03-18, 56 y.o., female MRN: [id number] Patient Care Team    Relationship Specialty Notifications Start End  [name], DO PCP - General Family Medicine  07/07/16   Nahser, Wonda Cheng, MD PCP - Cardiology Cardiology Admissions 12/15/18   Ob/Gyn, [name]    07/08/16   Wilford Corner, MD Consulting Physician Gastroenterology  07/08/16   [name], MD (Inactive) Consulting Physician Neurology  07/08/16   [name], MD Attending Physician Endocrinology  07/08/16    Comment: Pt established with Si Raider, NP at this location. - Integrative med- thyroid  [name], MD Consulting Physician Rehabilitation  03/06/19    Comment: Neurosurgeon-spine and scoliosis specialist    Chief Complaint  Patient presents with   Cyst     Subjective: Pt presents for an OV with complaints of irritation on her scalp.  She reports a little over a week ago she had a tender bump on her scalp.  She states it became swollen and tender abruptly.  It has improved but has not completely resolved.  She states it still tender.  She has not noticed any drainage.  She was seen at the urgent care and they thought she might have shingles and provide her with antiviral.  She is unaware if she was exposed to insects/ticks.  She reports before this occurred she thought she felt something at that location on her scalp but could have been insect related.     07/15/2021    1:54 PM 04/29/2021   10:58 AM 03/18/2021    4:06 PM 10/02/2020    8:07 AM 01/18/2020    9:05 AM  Depression screen PHQ 2/9  Decreased Interest 1 0 0 1 0  Down, Depressed, Hopeless 1 0 0 1 0  PHQ - 2 Score 2 0 0 2 0  Altered sleeping 3  0 2   Tired, decreased energy 3  0 1   Change in appetite 3  0 1   Feeling bad or failure about yourself    0 0   Trouble concentrating 3  0 0   Moving slowly or fidgety/restless 0  0 0   Suicidal thoughts 0  0 0   PHQ-9 Score 14  0 6   Difficult doing work/chores   Not  difficult at all      Allergies  Allergen Reactions   Bee Venom Swelling and Anaphylaxis   Yellow Jacket Venom Swelling   Biaxin [Clarithromycin] Diarrhea and Rash   Gabapentin Nausea And Vomiting    No reaction to Horizant   Robaxin [Methocarbamol] Swelling    Feet, chest pain, headache   Abilify [Aripiprazole] Other (See Comments)    MOOD SWING   Buspar [Buspirone] Diarrhea   Hydrochlorothiazide Rash   Lisinopril Cough   Maxzide [Triamterene-Hctz] Rash   Phentermine Other (See Comments)    MOOD CHANGE    Quetiapine Other (See Comments)    Felt funny MOOD SWINGS   Seroquel [Quetiapine Fumarate] Other (See Comments)    MOOD SWINGS   Social History   Social History Narrative   Patient is married [name] [name])  2 children [name] [name] and [name] [name])   Patient is right handed.   Patient has a college education. 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    Patient drinks 1 cups daily. Uses herbal remedies, takes a daily vitamin.   Wears her seatbelt, smoke detector in the home.   Past Medical History:  Diagnosis Date  Acute bronchitis due to COVID-19 virus 09/06/2020   Anxiety 01/12/2013   Arthralgia 07/08/2016   Asthma    Atrial fibrillation ([place]) 12/06/2018   B12 deficiency 01/31/2018   Chicken pox    Chronic insomnia 11/24/2016   Clostridium difficile infection 04/02/2014, 02/06/2014   Complex tear of medial meniscus of left knee as current injury 12/01/2018   Complex tear of medial meniscus of left knee as current injury 12/01/2018   DDD (degenerative disc disease), lumbar 09/09/2018   Depression    Diabetes mellitus without complication ([place]) 95/0/9326   Dyslipidemia    Elevated hemoglobin A1c 07/08/2016   Estrogen deficiency 12/06/2018   Foraminal stenosis of lumbar region 09/09/2018   Hair loss 07/20/2017   History of lumbar fusion 09/09/2018   Hormone replacement therapy (HRT) 07/20/2017   HTN (hypertension)    Hypertension 07/08/2016   Hypokalemia 12/06/2018   Hypothyroidism    Left medial tibial  plateau fracture 11/10/2018   Lumbar radiculopathy 09/09/2018   Referred to NS 09/09/2018   Migraine    Mild memory disturbance 11/13/2013   Morbid obesity ([place]) 07/08/2016   Multiple allergies    Obesity    Rash associated with COVID-19 10/02/2020   Restless legs syndrome (RLS) 01/12/2013   Follows with Dr. Jannifer Franklin who prescribes Requip and gabapentin   RLS (restless legs syndrome)    Vitamin D deficiency 12/06/2018   Wears glasses 07/08/2016   Past Surgical History:  Procedure Laterality Date   BREAST BIOPSY  7124,5809   x 2 benign lesion   CERVICAL DISCECTOMY  2003   CERVICAL FUSION  2010   x2   Hand fracture surgery     LUMBAR FUSION  2016   VAGINAL HYSTERECTOMY  2009   Family History  Problem Relation Age of Onset   Hypertension Father    Diabetes Father    Heart disease Father    Hearing loss Father    Diabetes Brother    Liver cancer Mother    COPD Mother    Mental illness Mother    Diabetes Mother    Hearing loss Mother    Heart disease Mother    Arthritis/Rheumatoid Mother    Allergies as of 09/10/2021       Reactions   Bee Venom Swelling, Anaphylaxis   Yellow Jacket Venom Swelling   Biaxin [clarithromycin] Diarrhea, Rash   Gabapentin Nausea And Vomiting   No reaction to Horizant   Robaxin [methocarbamol] Swelling   Feet, chest pain, headache   Abilify [aripiprazole] Other (See Comments)   MOOD SWING   Buspar [buspirone] Diarrhea   Hydrochlorothiazide Rash   Lisinopril Cough   Maxzide [triamterene-hctz] Rash   Phentermine Other (See Comments)   MOOD CHANGE   Quetiapine Other (See Comments)   Felt funny MOOD SWINGS   Seroquel [quetiapine Fumarate] Other (See Comments)   MOOD SWINGS        Medication List        Accurate as of September 10, 2021  6:13 PM. If you have any questions, ask your nurse or doctor.          albuterol 108 (90 Base) MCG/ACT inhaler Commonly known as: VENTOLIN HFA Inhale 2 puffs into the lungs every 6 (six) hours as needed  for wheezing or shortness of breath.   buPROPion 300 MG 24 hr tablet Commonly known as: WELLBUTRIN XL Take 1 tablet (300 mg total) by mouth daily.   cholecalciferol 1000 units tablet Commonly known as: VITAMIN D Take 5,000 Units  by mouth daily.   clobetasol ointment 0.05 % Commonly known as: TEMOVATE Apply 1 application topically 2 (two) times daily.   DULoxetine 60 MG capsule Commonly known as: CYMBALTA Take 1 capsule (60 mg total) by mouth daily.   Eliquis 5 MG Tabs tablet Generic drug: apixaban TAKE 1 TABLET BY MOUTH TWICE A DAY   estradiol 2 MG tablet Commonly known as: ESTRACE Take 1 tablet (2 mg total) by mouth daily.   gabapentin 300 MG capsule Commonly known as: NEURONTIN Take 2 capsules (600 mg total) by mouth 2 (two) times daily.   HYDROcodone-acetaminophen 5-325 MG tablet Commonly known as: NORCO/VICODIN Take 1 tablet by mouth every 8 (eight) hours as needed.   levothyroxine 150 MCG tablet Commonly known as: SYNTHROID Take 1 tablet (150 mcg total) by mouth daily.   magnesium oxide 400 MG tablet Commonly known as: MAG-OX Take 1 tablet (400 mg total) by mouth daily.   meloxicam 7.5 MG tablet Commonly known as: MOBIC Take 1 tablet (7.5 mg total) by mouth daily.   metoprolol succinate 25 MG 24 hr tablet Commonly known as: TOPROL-XL Take 0.5 tablets (12.5 mg total) by mouth daily.   Potassium Chloride ER 20 MEQ Tbcr Take 20 mEq by mouth daily.   progesterone 100 MG capsule Commonly known as: PROMETRIUM Take 1 capsule (100 mg total) by mouth daily.   rOPINIRole 2 MG tablet Commonly known as: REQUIP Take 1/2 tablet at lunch and 1/2 tablet at bed time.   rOPINIRole 4 MG 24 hr tablet Commonly known as: REQUIP XL Take 1 tablet (4 mg total) by mouth daily.   telmisartan 80 MG tablet Commonly known as: Micardis Take 1 tablet (80 mg total) by mouth daily.        All past medical history, surgical history, allergies, family history, immunizations  andmedications were updated in the EMR today and reviewed under the history and medication portions of their EMR.     ROS Negative, with the exception of above mentioned in HPI   Objective:  BP 129/73   Pulse 92   Temp 98.7 F (37.1 C) (Oral)   Ht '5\' 3"'$  (1.6 m)   Wt 268 lb (121.6 kg)   SpO2 95%   BMI 47.47 kg/m  Body mass index is 47.47 kg/m. Physical Exam Vitals and nursing note reviewed.  Constitutional:      General: She is not in acute distress.    Appearance: Normal appearance. She is normal weight. She is not ill-appearing or toxic-appearing.  HENT:     Head: Normocephalic.      Comments: Approximately 3 mm erythemic, mildly raised lesion with a small scab formation centrally.  Mild TTP.  No abscess, drainage, bleeding, vesicles or foreign body appreciated.  Viewed underneath magnification today. Eyes:     Extraocular Movements: Extraocular movements intact.     Conjunctiva/sclera: Conjunctivae normal.     Pupils: Pupils are equal, round, and reactive to light.  Neurological:     Mental Status: She is alert and oriented to person, place, and time. Mental status is at baseline.  Psychiatric:        Mood and Affect: Mood normal.        Behavior: Behavior normal.        Thought Content: Thought content normal.        Judgment: Judgment normal.      No results found. No results found. No results found for this or any previous visit (from the past 24 hour(s)).  Assessment/Plan: DARRELLE BARRELL is a 56 y.o. female present for OV for  Skin lesion/tick bite of scalp, initial encounter Patient was diagnosed with possible shingles at urgent care.  Current exam would not be consistent with shingles, however cannot see original presentation.  Today it appears like she possibly sustained a insect bite with it having a central scab-like area.  Treated area with silver nitrate to help with healing and closure. She has no other associated symptoms to feel antibiotics would be  warranted at this time.  We will complete Lyme titers in approximately 2 weeks, which would make it approximately 1 month after onset, to be thorough. - B. burgdorfi antibodies She will monitor for any worsening symptoms or associated symptoms that could be contributed to Lyme such as headache, rash, fever etc. Patient reports understanding.  Reviewed expectations re: course of current medical issues. Discussed self-management of symptoms. Outlined signs and symptoms indicating need for more acute intervention. Patient verbalized understanding and all questions were answered. Patient received an After-Visit Summary.    Orders Placed This Encounter  Procedures   B. burgdorfi antibodies   No orders of the defined types were placed in this encounter.  Referral Orders  No referral(s) requested today     Note is dictated utilizing voice recognition software. Although note has been proof read prior to signing, occasional typographical errors still can be missed. If any questions arise, please do not hesitate to call for verification.   electronically signed by:  Howard Pouch, DO  [place]

## 2021-09-10 NOTE — Telephone Encounter (Signed)
Called pt. She is coming today to pick it up

## 2021-09-11 ENCOUNTER — Encounter: Payer: Self-pay | Admitting: Family Medicine

## 2021-09-11 ENCOUNTER — Ambulatory Visit (INDEPENDENT_AMBULATORY_CARE_PROVIDER_SITE_OTHER): Payer: 59 | Admitting: Family Medicine

## 2021-09-11 DIAGNOSIS — M1712 Unilateral primary osteoarthritis, left knee: Secondary | ICD-10-CM

## 2021-09-11 NOTE — Addendum Note (Signed)
Addended by: Robyne Peers on: 09/11/2021 04:13 PM   Modules accepted: Orders

## 2021-09-11 NOTE — Progress Notes (Signed)
  [name] [name] - 56 y.o. female MRN [id number]  Date of birth: 02-23-1966  SUBJECTIVE:  Including CC & ROS.  No chief complaint on file.   [name] [name] is a 56 y.o. female that is following up for her knee pain.  She has noticed some improvement since the injections.  Still has pain every so often with popping.   Review of Systems See HPI   HISTORY: Past Medical, Surgical, Social, and Family History Reviewed & Updated per EMR.   Pertinent Historical Findings include:  Past Medical History:  Diagnosis Date   Acute bronchitis due to COVID-19 virus 09/06/2020   Anxiety 01/12/2013   Arthralgia 07/08/2016   Asthma    Atrial fibrillation ([place]) 12/06/2018   B12 deficiency 01/31/2018   Chicken pox    Chronic insomnia 11/24/2016   Clostridium difficile infection 04/02/2014, 02/06/2014   Complex tear of medial meniscus of left knee as current injury 12/01/2018   Complex tear of medial meniscus of left knee as current injury 12/01/2018   DDD (degenerative disc disease), lumbar 09/09/2018   Depression    Diabetes mellitus without complication ([place]) 65/07/8125   Dyslipidemia    Elevated hemoglobin A1c 07/08/2016   Estrogen deficiency 12/06/2018   Foraminal stenosis of lumbar region 09/09/2018   Hair loss 07/20/2017   History of lumbar fusion 09/09/2018   Hormone replacement therapy (HRT) 07/20/2017   HTN (hypertension)    Hypertension 07/08/2016   Hypokalemia 12/06/2018   Hypothyroidism    Left medial tibial plateau fracture 11/10/2018   Lumbar radiculopathy 09/09/2018   Referred to NS 09/09/2018   Migraine    Mild memory disturbance 11/13/2013   Morbid obesity ([place]) 07/08/2016   Multiple allergies    Obesity    Rash associated with COVID-19 10/02/2020   Restless legs syndrome (RLS) 01/12/2013   Follows with Dr. Jannifer Franklin who prescribes Requip and gabapentin   RLS (restless legs syndrome)    Vitamin D deficiency 12/06/2018   Wears glasses 07/08/2016    Past Surgical History:  Procedure Laterality Date    BREAST BIOPSY  5170,0174   x 2 benign lesion   CERVICAL DISCECTOMY  2003   CERVICAL FUSION  2010   x2   Hand fracture surgery     LUMBAR FUSION  2016   VAGINAL HYSTERECTOMY  2009     PHYSICAL EXAM:  VS: BP 138/90 (BP Location: Left Arm, Patient Position: Sitting)   Ht '5\' 3"'$  (1.6 m)   Wt 268 lb (121.6 kg)   BMI 47.47 kg/m  Physical Exam Gen: NAD, alert, cooperative with exam, well-appearing MSK:  Neurovascularly intact       ASSESSMENT & PLAN:   Primary osteoarthritis of left knee Acute on chronic in nature. Has gotten improvement with zilretta.  She has tried medial unloader brace. -Counseled on home exercise therapy and supportive care. -Referral to physical therapy. -Could consider gel injections.

## 2021-09-11 NOTE — Assessment & Plan Note (Addendum)
Acute on chronic in nature. Has gotten improvement with zilretta.  She has tried medial unloader brace. -Counseled on home exercise therapy and supportive care. -Referral to physical therapy. -Could consider gel injections.

## 2021-09-18 NOTE — Therapy (Signed)
OUTPATIENT PHYSICAL THERAPY EVALUATION   Patient Name: [name] [name] MRN: [id number] DOB:28-Jul-1965, 56 y.o., female Today's Date: 09/19/2021   PT End of Session - 09/19/21 0754     Visit Number 1    Number of Visits 20    Date for PT Re-Evaluation 11/28/21    Authorization Type [employer] no copay    Progress Note Due on Visit 10    PT Start Time 0805    PT Stop Time 0845    PT Time Calculation (min) 40 min    Activity Tolerance Patient limited by pain    Behavior During Therapy [facility] for tasks assessed/performed             Past Medical History:  Diagnosis Date   Acute bronchitis due to COVID-19 virus 09/06/2020   Anxiety 01/12/2013   Arthralgia 07/08/2016   Asthma    Atrial fibrillation ([place]) 12/06/2018   B12 deficiency 01/31/2018   Chicken pox    Chronic insomnia 11/24/2016   Clostridium difficile infection 04/02/2014, 02/06/2014   Complex tear of medial meniscus of left knee as current injury 12/01/2018   Complex tear of medial meniscus of left knee as current injury 12/01/2018   DDD (degenerative disc disease), lumbar 09/09/2018   Depression    Diabetes mellitus without complication ([place]) 46/04/7033   Dyslipidemia    Elevated hemoglobin A1c 07/08/2016   Estrogen deficiency 12/06/2018   Foraminal stenosis of lumbar region 09/09/2018   Hair loss 07/20/2017   History of lumbar fusion 09/09/2018   Hormone replacement therapy (HRT) 07/20/2017   HTN (hypertension)    Hypertension 07/08/2016   Hypokalemia 12/06/2018   Hypothyroidism    Left medial tibial plateau fracture 11/10/2018   Lumbar radiculopathy 09/09/2018   Referred to NS 09/09/2018   Migraine    Mild memory disturbance 11/13/2013   Morbid obesity ([place]) 07/08/2016   Multiple allergies    Obesity    Rash associated with COVID-19 10/02/2020   Restless legs syndrome (RLS) 01/12/2013   Follows with Dr. Jannifer Franklin who prescribes Requip and gabapentin   RLS (restless legs syndrome)    Vitamin D deficiency 12/06/2018   Wears  glasses 07/08/2016   Past Surgical History:  Procedure Laterality Date   BREAST BIOPSY  0093,8182   x 2 benign lesion   CERVICAL DISCECTOMY  2003   CERVICAL FUSION  2010   x2   Hand fracture surgery     LUMBAR FUSION  2016   VAGINAL HYSTERECTOMY  2009   Patient Active Problem List   Diagnosis Date Noted   Primary osteoarthritis of left knee 08/05/2021   Cervical fusion syndrome 07/09/2021   Chronic pain syndrome 07/09/2021   Myofascial pain syndrome of thoracic spine 05/29/2021   Cervical radiculopathy 05/29/2021   Chicken pox 04/21/2021   Asthma 04/21/2021   Stress fracture of rib 04/15/2021   Scapular dysfunction 04/02/2021   Effusion, left shoulder 03/26/2021   AC joint derangement 03/26/2021   BMI 45.0-49.9, adult ([place]) 03/26/2021   Neuropathy 02/18/2021   Multiple allergies 10/02/2020   Acquired thrombophilia (HCC)-eliquis 12/20/2019   Hypokalemia 12/06/2018   Atrial fibrillation ([place]) 12/06/2018   Vitamin D deficiency 12/06/2018   Estrogen deficiency 12/06/2018   Clostridium difficile infection 12/01/2018   DDD (degenerative disc disease), lumbar 09/09/2018   Foraminal stenosis of lumbar region 09/09/2018   History of lumbar fusion 09/09/2018   Lumbar radiculopathy 09/09/2018   B12 deficiency 01/31/2018   Hormone replacement therapy (HRT) 07/20/2017   Chronic insomnia 11/24/2016  Elevated hemoglobin A1c 07/08/2016   Hypertension 07/08/2016   Arthralgia 07/08/2016   Morbid obesity ([place]) 07/08/2016   Hypothyroidism    Dyslipidemia    Depression    Migraine 11/13/2013   Restless legs syndrome (RLS) 01/12/2013   Anxiety 01/12/2013    PCP: Howard Pouch DO  REFERRING PROVIDER: Marybelle Killings, MD   REFERRING DIAG: [phone] (ICD-10-CM) - Primary osteoarthritis of left knee Z98.1 (ICD-10-CM) - History of lumbar fusion M54.2 (ICD-10-CM) - Neck pain  Rationale for Evaluation and Treatment Rehabilitation  THERAPY DIAG:  Other low back  pain  Cervicalgia  Chronic pain of left knee  Muscle weakness (generalized)  Abnormal posture  Difficulty in walking, not elsewhere classified  ONSET DATE: 03/02/2021  SUBJECTIVE:                                                                                                                                                                                           SUBJECTIVE STATEMENT: Pt indicated her chief concern is related neck/back, insidious onset about 8 months.   Pt stated it initially started in neck and shoulder and then also in back/hips.  Pt indicated now pain in consistent in both areas with tenderness to touch.  Difficulty sleeping (disrupted about 50%).   Denies numbness/tingling, bowel or bladder changes.    Pt indicated knee pain "for years".   Pt indicated a history of small fracture in Lt knee.  Reported popping in knee and feeling like it gives way with difficulty putting weight on it (stairs).   Has to get help with housework. Rosemarie Ax, MD initially referred for knee pain complaints.  Included into POC for Dr. Lorin Mercy PERTINENT HISTORY:  DDD lumbar, depression, DM, HTN, hypothyroidism, obesity, arthritis. lumbar fusion and cervical fusion c4-7  PAIN:  NPRS scale: Back at worst 7/10, neck/shoulders at worst 2/10, Lt knee at worst 8/10  Pain location: Back, neck/shoulders, Lt knee Pain description: back: consistent, dull, intense   neck: shooting   Lt knee: giving way/intense - sharp Aggravating factors:  carrying items, housework,  standing/walking prolonged, stairs, squatting. Bed mobility turning, sitting prolonged  Relieving factors: massage at home, heat pad, cervical traction over door.    PRECAUTIONS: history of lumbar and cervical fusions  WEIGHT BEARING RESTRICTIONS No  FALLS:  Has patient fallen in last 6 months? No  LIVING ENVIRONMENT: Lives with: lives with their family Lives in: House/apartment Stairs: 4 stairs to enter, has rails but can  use only one  OCCUPATION: [employer] work with sitting required.   PLOF: Independent, housework, yardwork  PATIENT GOALS   "get pain under control"   OBJECTIVE:  DIAGNOSTIC FINDINGS:  09/19/2021:  Multiple imaging over time, see in Epic chart  PATIENT SURVEYS:  09/19/2021 FOTO knee: 33, goal 55  SCREENING FOR RED FLAGS: 09/19/2021 Bowel or bladder incontinence: No Cauda equina syndrome: No  COGNITION: 09/19/2021 Overall cognitive status: Within functional limits for tasks assessed     SENSATION: 09/19/2021 unremarkable for changes  MUSCLE LENGTH: 09/19/2021 No specific tested today  POSTURE: 09/19/2021  Forward head posture, rounded shoulders, reduced lumbar lordosis  PALPATION: 09/19/2021 General tenderness noted in Lt middle rhomboids, lower trap, infraspinatus.  Bilateral upper trap tenderness and increased tightness.  Mild tenderness Rt QL, inferior lumbar paraspinals.    Tenderness to touch anterior/lateral joint line Lt knee.   CERVICAL/ LUMBAR ROM:   Active  AROM  09/19/2021  Lumbar Flexion To patella  Lumbar  Extension 25 % in standing  Lumbar  Right lateral flexion To mid thigh c Rt lumbar pain  Lumbar  Left lateral flexion To mid thigh c Rt lumbar pain     Cervical flexion 30  Cervical extension 45  Cervical Lt rotation 45  Cervical Rt rotation 58   (Blank rows = not tested)  LOWER EXTREMITY ROM:      Right 09/19/2021 Left 09/19/2021  Hip flexion 95 Passive ROM in supine 95 Passive ROM in supine  Hip extension    Hip abduction    Hip adduction    Hip internal rotation    Hip external rotation 45 Passive ROM in supine 40 Passive ROM in supine  Knee flexion    Knee extension 0 AROM in seated LAQ 0 AROM in seated LAQ c pain noted  Ankle dorsiflexion    Ankle plantarflexion    Ankle inversion    Ankle eversion     (Blank rows = not tested)  LOWER EXTREMITY MMT:     09/19/2021:  General resisted static testing in supine hooklying : bilateral hip abd  and add strong and painless MMT Right 09/19/2021 Left 09/19/2021  Hip flexion 5/5 5/5  Hip extension    Hip abduction    Hip adduction    Hip internal rotation    Hip external rotation    Knee flexion 5/5 5/5  Knee extension 5/5 4/5  Ankle dorsiflexion    Ankle plantarflexion    Ankle inversion    Ankle eversion     (Blank rows = not tested)  LUMBAR SPECIAL TESTS:  09/19/2021  Slump test Lt:     Rt:     Spurling's test neg bil.  FUNCTIONAL TESTS:   09/19/2021:    5x Sit to stand:  18.99 seconds    18 inch chair transfer:  able to perform without s UE  GAIT: 09/19/2021 Independent ambulation c mild deviation of reduced stance phase on Lt knee, mild forward trunk lean.  Household based distances observed in clinic.     TODAY'S TREATMENT  09/19/2021 Therex:  HEP instruction/performance c cues for techniques, handout provided.  Trial set performed of each for comprehension and symptom assessment.  See below for exercise list.   PATIENT EDUCATION:  Education details: HEP, POC Person educated: Patient Education method: Explanation, Demonstration, Verbal cues, and Handouts Education comprehension: verbalized understanding, returned demonstration, and verbal cues required   HOME EXERCISE PROGRAM: Access Code: [email or web address] URL: [email or web address] Date: 09/19/2021 Prepared by: Scot Jun  Exercises - Supine Lower Trunk Rotation  - 2-3 x daily - 7 x weekly - 1 sets - 3-5 reps - 30 hold - Supine Bridge  - 2 x  daily - 7 x weekly - 2-3 sets - 10 reps - Supine Cervical Retraction with Towel  - 2-3 x daily - 7 x weekly - 2-3 sets - 10 reps - 5 hold - Supine Scapular Retraction  - 2-3 x daily - 7 x weekly - 2-3 sets - 10 reps - 5 hold - Seated Cervical Retraction  - 2-3 x daily - 7 x weekly - 3 sets - 10 reps - Seated Scapular Retraction  - 3-5 x daily - 7 x weekly - 2-3 sets - 10 reps - 5 hold  ASSESSMENT:  CLINICAL IMPRESSION: Patient is a 56 y.o. who comes  to clinic with complaints of cervical, thoracic, low back pain as well as Lt knee pain with mobility, strength and movement coordination deficits that impair their ability to perform usual daily and recreational functional activities without increase difficulty/symptoms at this time.  Patient to benefit from skilled PT services to address impairments and limitations to improve to previous level of function without restriction secondary to condition.    OBJECTIVE IMPAIRMENTS Abnormal gait, decreased activity tolerance, decreased coordination, decreased endurance, decreased mobility, difficulty walking, decreased ROM, decreased strength, hypomobility, impaired perceived functional ability, impaired flexibility, impaired UE functional use, improper body mechanics, postural dysfunction, and pain.   ACTIVITY LIMITATIONS carrying, lifting, bending, sitting, standing, squatting, sleeping, stairs, transfers, bed mobility, reach over head, and locomotion level  PARTICIPATION LIMITATIONS: meal prep, cleaning, laundry, interpersonal relationship, shopping, and community activity  PERSONAL FACTORS  Multiple treatment areas, length of time since onset, surgical history, DDD lumbar, depression, DM, HTN, hypothyroidism, obesity, arthritis. lumbar fusion and cervical fusion c4-   are also affecting patient's functional outcome.   REHAB POTENTIAL: Fair to good  CLINICAL DECISION MAKING: Evolving/moderate complexity  EVALUATION COMPLEXITY: Moderate   GOALS: Goals reviewed with patient? Yes  Short term PT Goals (target date for Short term goals are 3 weeks 10/10/2021) Patient will demonstrate independent use of home exercise program to maintain progress from in clinic treatments. Goal status: New   Long term PT goals (target dates for all long term goals are 10 weeks  11/28/2021 )  1. Patient will demonstrate/report pain at worst less than or equal to 2/10 to facilitate minimal limitation in daily activity  secondary to pain symptoms. Goal status: New  2. Patient will demonstrate independent use of home exercise program to facilitate ability to maintain/progress functional gains from skilled physical therapy services. Goal status: New  3. Patient will demonstrate FOTO knee outcome > or = 55 % to indicate reduced disability due to condition. Goal status: New  4.  Patient will demonstrate Lt LE MMT 5/5 throughout to facilitate ability to perform usual standing, walking, stairs at PLOF s limitation due to symptoms. Goal status: New  5.  Patient will demonstrate lumbar AROM > or = 75% WFL to facilitate upright standing/walking posture at PLOF.    Goal status: New  6.  Patient will demonstrate cervical AROM improvements > or = 10 degrees on average to facilitate improved cervical mobility c daily activity.   Goal status: New  7.  Patient will demonstrate/report ability to perform sitting > 1hr, standing and walking > 30 mins each for improvement in tolerance to static postures.  a.  Goal Status: New  8. Patient will demonstrate/report ascending/descending stairs s UE assist c reciprocal gait pattern for house entry.    PLAN: PT FREQUENCY: 1-2x/week  PT DURATION: 10 weeks  PLANNED INTERVENTIONS: Therapeutic exercises, Therapeutic activity, Neuro Muscular re-education, Balance  training, Gait training, Patient/Family education, Joint mobilization, Stair training, DME instructions, Dry Needling, Electrical stimulation, Cryotherapy, Moist heat, Taping, Ultrasound, Ionotophoresis '4mg'$ /ml Dexamethasone, and Manual therapy.  All included unless contraindicated.  PLAN FOR NEXT SESSION: Review HEP.  Patient had specific complaints for Lt mid thoracic/scapular pain c lying down which requires specific follow up treatment (joint mobility assessment, myofascial release).    General intervention for postural strengthening and help improve static posture tolerance.    Scot Jun, PT, DPT, OCS,  ATC 09/19/21  9:06 AM

## 2021-09-19 ENCOUNTER — Ambulatory Visit (INDEPENDENT_AMBULATORY_CARE_PROVIDER_SITE_OTHER): Payer: 59 | Admitting: Rehabilitative and Restorative Service Providers"

## 2021-09-19 ENCOUNTER — Encounter: Payer: Self-pay | Admitting: Rehabilitative and Restorative Service Providers"

## 2021-09-19 ENCOUNTER — Other Ambulatory Visit: Payer: Self-pay

## 2021-09-19 DIAGNOSIS — M542 Cervicalgia: Secondary | ICD-10-CM | POA: Diagnosis not present

## 2021-09-19 DIAGNOSIS — M6281 Muscle weakness (generalized): Secondary | ICD-10-CM

## 2021-09-19 DIAGNOSIS — M25562 Pain in left knee: Secondary | ICD-10-CM

## 2021-09-19 DIAGNOSIS — G8929 Other chronic pain: Secondary | ICD-10-CM

## 2021-09-19 DIAGNOSIS — R262 Difficulty in walking, not elsewhere classified: Secondary | ICD-10-CM

## 2021-09-19 DIAGNOSIS — R293 Abnormal posture: Secondary | ICD-10-CM

## 2021-09-19 DIAGNOSIS — M5459 Other low back pain: Secondary | ICD-10-CM | POA: Diagnosis not present

## 2021-09-26 ENCOUNTER — Ambulatory Visit (INDEPENDENT_AMBULATORY_CARE_PROVIDER_SITE_OTHER): Payer: 59 | Admitting: Physical Therapy

## 2021-09-26 ENCOUNTER — Encounter: Payer: Self-pay | Admitting: Physical Therapy

## 2021-09-26 DIAGNOSIS — M542 Cervicalgia: Secondary | ICD-10-CM

## 2021-09-26 DIAGNOSIS — M25562 Pain in left knee: Secondary | ICD-10-CM

## 2021-09-26 DIAGNOSIS — M6281 Muscle weakness (generalized): Secondary | ICD-10-CM | POA: Diagnosis not present

## 2021-09-26 DIAGNOSIS — R262 Difficulty in walking, not elsewhere classified: Secondary | ICD-10-CM

## 2021-09-26 DIAGNOSIS — M5459 Other low back pain: Secondary | ICD-10-CM | POA: Diagnosis not present

## 2021-09-26 DIAGNOSIS — G8929 Other chronic pain: Secondary | ICD-10-CM

## 2021-09-26 DIAGNOSIS — R293 Abnormal posture: Secondary | ICD-10-CM

## 2021-09-26 NOTE — Therapy (Signed)
OUTPATIENT PHYSICAL THERAPY TREATMENT   Patient Name: [name] [name] MRN: [id number] DOB:December 01, 1965, 56 y.o., female Today's Date: 09/26/2021   PT End of Session - 09/26/21 0901     Visit Number 2    Number of Visits 20    Date for PT Re-Evaluation 11/28/21    Authorization Type [employer] no copay    Progress Note Due on Visit 10    PT Start Time 0846    PT Stop Time 0926    PT Time Calculation (min) 40 min    Activity Tolerance Patient tolerated treatment well;No increased pain    Behavior During Therapy WFL for tasks assessed/performed              Past Medical History:  Diagnosis Date   Acute bronchitis due to COVID-19 virus 09/06/2020   Anxiety 01/12/2013   Arthralgia 07/08/2016   Asthma    Atrial fibrillation ([place]) 12/06/2018   B12 deficiency 01/31/2018   Chicken pox    Chronic insomnia 11/24/2016   Clostridium difficile infection 04/02/2014, 02/06/2014   Complex tear of medial meniscus of left knee as current injury 12/01/2018   Complex tear of medial meniscus of left knee as current injury 12/01/2018   DDD (degenerative disc disease), lumbar 09/09/2018   Depression    Diabetes mellitus without complication ([place]) 31/05/9700   Dyslipidemia    Elevated hemoglobin A1c 07/08/2016   Estrogen deficiency 12/06/2018   Foraminal stenosis of lumbar region 09/09/2018   Hair loss 07/20/2017   History of lumbar fusion 09/09/2018   Hormone replacement therapy (HRT) 07/20/2017   HTN (hypertension)    Hypertension 07/08/2016   Hypokalemia 12/06/2018   Hypothyroidism    Left medial tibial plateau fracture 11/10/2018   Lumbar radiculopathy 09/09/2018   Referred to NS 09/09/2018   Migraine    Mild memory disturbance 11/13/2013   Morbid obesity ([place]) 07/08/2016   Multiple allergies    Obesity    Rash associated with COVID-19 10/02/2020   Restless legs syndrome (RLS) 01/12/2013   Follows with Dr. Jannifer Franklin who prescribes Requip and gabapentin   RLS (restless legs syndrome)    Vitamin D  deficiency 12/06/2018   Wears glasses 07/08/2016   Past Surgical History:  Procedure Laterality Date   BREAST BIOPSY  [id number]   x 2 benign lesion   CERVICAL DISCECTOMY  2003   CERVICAL FUSION  2010   x2   Hand fracture surgery     LUMBAR FUSION  2016   VAGINAL HYSTERECTOMY  2009   Patient Active Problem List   Diagnosis Date Noted   Primary osteoarthritis of left knee 08/05/2021   Cervical fusion syndrome 07/09/2021   Chronic pain syndrome 07/09/2021   Myofascial pain syndrome of thoracic spine 05/29/2021   Cervical radiculopathy 05/29/2021   Chicken pox 04/21/2021   Asthma 04/21/2021   Stress fracture of rib 04/15/2021   Scapular dysfunction 04/02/2021   Effusion, left shoulder 03/26/2021   AC joint derangement 03/26/2021   BMI 45.0-49.9, adult ([place]) 03/26/2021   Neuropathy 02/18/2021   Multiple allergies 10/02/2020   Acquired thrombophilia ([facility])-eliquis 12/20/2019   Hypokalemia 12/06/2018   Atrial fibrillation ([place]) 12/06/2018   Vitamin D deficiency 12/06/2018   Estrogen deficiency 12/06/2018   Clostridium difficile infection 12/01/2018   DDD (degenerative disc disease), lumbar 09/09/2018   Foraminal stenosis of lumbar region 09/09/2018   History of lumbar fusion 09/09/2018   Lumbar radiculopathy 09/09/2018   B12 deficiency 01/31/2018   Hormone replacement therapy (HRT) 07/20/2017  Chronic insomnia 11/24/2016   Elevated hemoglobin A1c 07/08/2016   Hypertension 07/08/2016   Arthralgia 07/08/2016   Morbid obesity ([place]) 07/08/2016   Hypothyroidism    Dyslipidemia    Depression    Migraine 11/13/2013   Restless legs syndrome (RLS) 01/12/2013   Anxiety 01/12/2013    PCP: Howard Pouch DO  REFERRING PROVIDER: Marybelle Killings, MD   REFERRING DIAG: [phone] (ICD-10-CM) - Primary osteoarthritis of left knee Z98.1 (ICD-10-CM) - History of lumbar fusion M54.2 (ICD-10-CM) - Neck pain  Rationale for Evaluation and Treatment Rehabilitation  THERAPY DIAG:  Other low  back pain  Cervicalgia  Chronic pain of left knee  Muscle weakness (generalized)  Abnormal posture  Difficulty in walking, not elsewhere classified  ONSET DATE: 03/02/2021  SUBJECTIVE:                                                                                                                                                                                           SUBJECTIVE STATEMENT:  My left knee hurts right hip is a problem, upper back is better but lower back is a problem this morning. Already seeing some improvements from HEP.   PERTINENT HISTORY:  DDD lumbar, depression, DM, HTN, hypothyroidism, obesity, arthritis. lumbar fusion and cervical fusion c4-7  PAIN:  NPRS scale: 5/10 in general today Pain location: Back, neck/shoulders, Lt knee Pain description: back: consistent, dull, intense   neck: shooting   Lt knee: giving way/intense - sharp Aggravating factors:  carrying items, housework,  standing/walking prolonged, stairs, squatting. Bed mobility turning, sitting prolonged  Relieving factors: massage at home, heat pad, cervical traction over door.    PRECAUTIONS: history of lumbar and cervical fusions  WEIGHT BEARING RESTRICTIONS No  FALLS:  Has patient fallen in last 6 months? No  LIVING ENVIRONMENT: Lives with: lives with their family Lives in: House/apartment Stairs: 4 stairs to enter, has rails but can use only one  OCCUPATION: [employer] work with sitting required.   PLOF: Independent, housework, yardwork  PATIENT GOALS   "get pain under control"      OBJECTIVE:   DIAGNOSTIC FINDINGS:  09/19/2021:  Multiple imaging over time, see in Epic chart  PATIENT SURVEYS:  09/19/2021 FOTO knee: 33, goal 40  SCREENING FOR RED FLAGS: 09/19/2021 Bowel or bladder incontinence: No Cauda equina syndrome: No  COGNITION: 09/19/2021 Overall cognitive status: Within functional limits for tasks assessed     SENSATION: 09/19/2021 unremarkable for  changes  MUSCLE LENGTH: 09/19/2021 No specific tested today  POSTURE: 09/19/2021  Forward head posture, rounded shoulders, reduced lumbar lordosis  PALPATION: 09/19/2021 General tenderness noted in Lt middle rhomboids, lower trap,  infraspinatus.  Bilateral upper trap tenderness and increased tightness.  Mild tenderness Rt QL, inferior lumbar paraspinals.    Tenderness to touch anterior/lateral joint line Lt knee.   CERVICAL/ LUMBAR ROM:   Active  AROM  09/19/2021  Lumbar Flexion To patella  Lumbar  Extension 25 % in standing  Lumbar  Right lateral flexion To mid thigh c Rt lumbar pain  Lumbar  Left lateral flexion To mid thigh c Rt lumbar pain     Cervical flexion 30  Cervical extension 45  Cervical Lt rotation 45  Cervical Rt rotation 58   (Blank rows = not tested)  LOWER EXTREMITY ROM:      Right 09/19/2021 Left 09/19/2021  Hip flexion 95 Passive ROM in supine 95 Passive ROM in supine  Hip extension    Hip abduction    Hip adduction    Hip internal rotation    Hip external rotation 45 Passive ROM in supine 40 Passive ROM in supine  Knee flexion    Knee extension 0 AROM in seated LAQ 0 AROM in seated LAQ c pain noted  Ankle dorsiflexion    Ankle plantarflexion    Ankle inversion    Ankle eversion     (Blank rows = not tested)  LOWER EXTREMITY MMT:     09/19/2021:  General resisted static testing in supine hooklying : bilateral hip abd and add strong and painless MMT Right 09/19/2021 Left 09/19/2021  Hip flexion 5/5 5/5  Hip extension    Hip abduction    Hip adduction    Hip internal rotation    Hip external rotation    Knee flexion 5/5 5/5  Knee extension 5/5 4/5  Ankle dorsiflexion    Ankle plantarflexion    Ankle inversion    Ankle eversion     (Blank rows = not tested)  LUMBAR SPECIAL TESTS:  09/19/2021  Slump test Lt:     Rt:     Spurling's test neg bil.  FUNCTIONAL TESTS:   09/19/2021:    5x Sit to stand:  18.99 seconds    18 inch chair transfer:   able to perform without s UE  GAIT: 09/19/2021 Independent ambulation c mild deviation of reduced stance phase on Lt knee, mild forward trunk lean.  Household based distances observed in clinic.     TODAY'S TREATMENT   09/26/21  Nustep L4 x6 minutes BLEs only  SKTC 5x5 second holds B Lumbar rotation stretches 5x5 seconds B  HS stretch 2x30 seconds B  Piriformis stretch 2x30 seconds B Bridges 2x10 2 second holds  Supine clams 2x10 red TB 2 second holds  PPT 2x10 3 second holds PPT with march 2x10   Grade I-II PAs L5-T10   09/19/2021 Therex:  HEP instruction/performance c cues for techniques, handout provided.  Trial set performed of each for comprehension and symptom assessment.  See below for exercise list.   PATIENT EDUCATION:  Education details: HEP, POC Person educated: Patient Education method: Explanation, Demonstration, Verbal cues, and Handouts Education comprehension: verbalized understanding, returned demonstration, and verbal cues required   HOME EXERCISE PROGRAM: Access Code: [email or web address] URL: [email or web address] Date: 09/19/2021 Prepared by: Scot Jun  Exercises - Supine Lower Trunk Rotation  - 2-3 x daily - 7 x weekly - 1 sets - 3-5 reps - 30 hold - Supine Bridge  - 2 x daily - 7 x weekly - 2-3 sets - 10 reps - Supine Cervical Retraction with Towel  - 2-3 x daily - 7 x weekly -  2-3 sets - 10 reps - 5 hold - Supine Scapular Retraction  - 2-3 x daily - 7 x weekly - 2-3 sets - 10 reps - 5 hold - Seated Cervical Retraction  - 2-3 x daily - 7 x weekly - 3 sets - 10 reps - Seated Scapular Retraction  - 3-5 x daily - 7 x weekly - 2-3 sets - 10 reps - 5 hold  ASSESSMENT:  CLINICAL IMPRESSION:  Akaisha arrives today doing OK, her upper back is feeling better from the HEP but still having a lot of pain in low back as well as L knee/R hip. Asked to focus on her low back pain today. We warmed up on the nustep then worked on some lumbar mobility and  LE stretching as well as strengthening/core work and spinal PAs as tolerated. Lumbar pain reduced to 2/10 at EOS. Will continue efforts.    OBJECTIVE IMPAIRMENTS Abnormal gait, decreased activity tolerance, decreased coordination, decreased endurance, decreased mobility, difficulty walking, decreased ROM, decreased strength, hypomobility, impaired perceived functional ability, impaired flexibility, impaired UE functional use, improper body mechanics, postural dysfunction, and pain.   ACTIVITY LIMITATIONS carrying, lifting, bending, sitting, standing, squatting, sleeping, stairs, transfers, bed mobility, reach over head, and locomotion level  PARTICIPATION LIMITATIONS: meal prep, cleaning, laundry, interpersonal relationship, shopping, and community activity  PERSONAL FACTORS  Multiple treatment areas, length of time since onset, surgical history, DDD lumbar, depression, DM, HTN, hypothyroidism, obesity, arthritis. lumbar fusion and cervical fusion c4-   are also affecting patient's functional outcome.   REHAB POTENTIAL: Fair to good  CLINICAL DECISION MAKING: Evolving/moderate complexity  EVALUATION COMPLEXITY: Moderate   GOALS: Goals reviewed with patient? Yes  Short term PT Goals (target date for Short term goals are 3 weeks 10/10/2021) Patient will demonstrate independent use of home exercise program to maintain progress from in clinic treatments. Goal status: New   Long term PT goals (target dates for all long term goals are 10 weeks  11/28/2021 )  1. Patient will demonstrate/report pain at worst less than or equal to 2/10 to facilitate minimal limitation in daily activity secondary to pain symptoms. Goal status: New  2. Patient will demonstrate independent use of home exercise program to facilitate ability to maintain/progress functional gains from skilled physical therapy services. Goal status: New  3. Patient will demonstrate FOTO knee outcome > or = 55 % to indicate reduced  disability due to condition. Goal status: New  4.  Patient will demonstrate Lt LE MMT 5/5 throughout to facilitate ability to perform usual standing, walking, stairs at PLOF s limitation due to symptoms. Goal status: New  5.  Patient will demonstrate lumbar AROM > or = 75% WFL to facilitate upright standing/walking posture at PLOF.    Goal status: New  6.  Patient will demonstrate cervical AROM improvements > or = 10 degrees on average to facilitate improved cervical mobility c daily activity.   Goal status: New  7.  Patient will demonstrate/report ability to perform sitting > 1hr, standing and walking > 30 mins each for improvement in tolerance to static postures.  a.  Goal Status: New  8. Patient will demonstrate/report ascending/descending stairs s UE assist c reciprocal gait pattern for house entry.    PLAN: PT FREQUENCY: 1-2x/week  PT DURATION: 10 weeks  PLANNED INTERVENTIONS: Therapeutic exercises, Therapeutic activity, Neuro Muscular re-education, Balance training, Gait training, Patient/Family education, Joint mobilization, Stair training, DME instructions, Dry Needling, Electrical stimulation, Cryotherapy, Moist heat, Taping, Ultrasound, Ionotophoresis '4mg'$ /ml Dexamethasone, and Manual  therapy.  All included unless contraindicated.  PLAN FOR NEXT SESSION: Review HEP.  Patient had specific complaints for Lt mid thoracic/scapular pain c lying down which requires specific follow up treatment (joint mobility assessment, myofascial release).    General intervention for postural strengthening and help improve static posture tolerance.    Hunt Oris, PT 09/26/2021, 9:29 AM

## 2021-09-30 ENCOUNTER — Telehealth: Payer: Self-pay | Admitting: Neurology

## 2021-09-30 ENCOUNTER — Encounter: Payer: Self-pay | Admitting: Physical Therapy

## 2021-09-30 ENCOUNTER — Ambulatory Visit (INDEPENDENT_AMBULATORY_CARE_PROVIDER_SITE_OTHER): Payer: 59 | Admitting: Physical Therapy

## 2021-09-30 DIAGNOSIS — M25562 Pain in left knee: Secondary | ICD-10-CM

## 2021-09-30 DIAGNOSIS — M5459 Other low back pain: Secondary | ICD-10-CM

## 2021-09-30 DIAGNOSIS — M6281 Muscle weakness (generalized): Secondary | ICD-10-CM | POA: Diagnosis not present

## 2021-09-30 DIAGNOSIS — M542 Cervicalgia: Secondary | ICD-10-CM | POA: Diagnosis not present

## 2021-09-30 DIAGNOSIS — R293 Abnormal posture: Secondary | ICD-10-CM

## 2021-09-30 DIAGNOSIS — G8929 Other chronic pain: Secondary | ICD-10-CM

## 2021-09-30 MED ORDER — GABAPENTIN 300 MG PO CAPS: 600.0000 mg | ORAL_CAPSULE | Freq: Two times a day (BID) | ORAL | 3 refills | Status: DC

## 2021-09-30 NOTE — Therapy (Signed)
OUTPATIENT PHYSICAL THERAPY TREATMENT   Patient Name: [name] [name] MRN: [id number] DOB:[birth year], 56 y.o., female Today's Date: 09/30/2021   PT End of Session - 09/30/21 1513     Visit Number 3    Number of Visits 20    Date for PT Re-Evaluation 11/28/21    Authorization Type [employer] no copay    Progress Note Due on Visit 10    PT Start Time 1432    PT Stop Time 1513    PT Time Calculation (min) 41 min    Activity Tolerance Patient tolerated treatment well;No increased pain    Behavior During Therapy WFL for tasks assessed/performed               Past Medical History:  Diagnosis Date   Acute bronchitis due to COVID-19 virus 09/06/2020   Anxiety 01/12/2013   Arthralgia 07/08/2016   Asthma    Atrial fibrillation ([place]) 12/06/2018   B12 deficiency 01/31/2018   Chicken pox    Chronic insomnia 11/24/2016   Clostridium difficile infection 04/02/2014, 02/06/2014   Complex tear of medial meniscus of left knee as current injury 12/01/2018   Complex tear of medial meniscus of left knee as current injury 12/01/2018   DDD (degenerative disc disease), lumbar 09/09/2018   Depression    Diabetes mellitus without complication ([place]) 56/05/8754   Dyslipidemia    Elevated hemoglobin A1c 07/08/2016   Estrogen deficiency 12/06/2018   Foraminal stenosis of lumbar region 09/09/2018   Hair loss 07/20/2017   History of lumbar fusion 09/09/2018   Hormone replacement therapy (HRT) 07/20/2017   HTN (hypertension)    Hypertension 07/08/2016   Hypokalemia 12/06/2018   Hypothyroidism    Left medial tibial plateau fracture 11/10/2018   Lumbar radiculopathy 09/09/2018   Referred to [facility] 09/09/2018   Migraine    Mild memory disturbance 11/13/2013   Morbid obesity ([place]) 07/08/2016   Multiple allergies    Obesity    Rash associated with COVID-19 10/02/2020   Restless legs syndrome (RLS) 01/12/2013   Follows with Dr. Jannifer Franklin who prescribes Requip and gabapentin   RLS (restless legs syndrome)    Vitamin D  deficiency 12/06/2018   Wears glasses 07/08/2016   Past Surgical History:  Procedure Laterality Date   BREAST BIOPSY  4332,9518   x 2 benign lesion   CERVICAL DISCECTOMY  2003   CERVICAL FUSION  2010   x2   Hand fracture surgery     LUMBAR FUSION  2016   VAGINAL HYSTERECTOMY  2009   Patient Active Problem List   Diagnosis Date Noted   Primary osteoarthritis of left knee 08/05/2021   Cervical fusion syndrome 07/09/2021   Chronic pain syndrome 07/09/2021   Myofascial pain syndrome of thoracic spine 05/29/2021   Cervical radiculopathy 05/29/2021   Chicken pox 04/21/2021   Asthma 04/21/2021   Stress fracture of rib 04/15/2021   Scapular dysfunction 04/02/2021   Effusion, left shoulder 03/26/2021   AC joint derangement 03/26/2021   BMI 45.0-49.9, adult ([place]) 03/26/2021   Neuropathy 02/18/2021   Multiple allergies 10/02/2020   Acquired thrombophilia ([facility])-eliquis 12/20/2019   Hypokalemia 12/06/2018   Atrial fibrillation ([place]) 12/06/2018   Vitamin D deficiency 12/06/2018   Estrogen deficiency 12/06/2018   Clostridium difficile infection 12/01/2018   DDD (degenerative disc disease), lumbar 09/09/2018   Foraminal stenosis of lumbar region 09/09/2018   History of lumbar fusion 09/09/2018   Lumbar radiculopathy 09/09/2018   B12 deficiency 01/31/2018   Hormone replacement therapy (HRT) 07/20/2017  Chronic insomnia 11/24/2016   Elevated hemoglobin A1c 07/08/2016   Hypertension 07/08/2016   Arthralgia 07/08/2016   Morbid obesity ([place]) 07/08/2016   Hypothyroidism    Dyslipidemia    Depression    Migraine 11/13/2013   Restless legs syndrome (RLS) 01/12/2013   Anxiety 01/12/2013    PCP: Howard Pouch DO  REFERRING PROVIDER: Marybelle Killings, MD   REFERRING DIAG: [phone] (ICD-10-CM) - Primary osteoarthritis of left knee Z98.1 (ICD-10-CM) - History of lumbar fusion M54.2 (ICD-10-CM) - Neck pain  Rationale for Evaluation and Treatment Rehabilitation  THERAPY DIAG:  Other low  back pain  Cervicalgia  Chronic pain of left knee  Muscle weakness (generalized)  Abnormal posture  ONSET DATE: 03/02/2021  SUBJECTIVE:                                                                                                                                                                                           SUBJECTIVE STATEMENT: My left knee hurts right hip is no good today, back feels better  PERTINENT HISTORY:  DDD lumbar, depression, DM, HTN, hypothyroidism, obesity, arthritis. lumbar fusion and cervical fusion c4-7  PAIN:  NPRS scale: 5/10 (mostly Rt hip and left knee) Pain location: Back, neck/shoulders, Lt knee Pain description: back: consistent, dull, intense   neck: shooting   Lt knee: giving way/intense - sharp Aggravating factors:  carrying items, housework,  standing/walking prolonged, stairs, squatting. Bed mobility turning, sitting prolonged  Relieving factors: massage at home, heat pad, cervical traction over door.    PRECAUTIONS: history of lumbar and cervical fusions  WEIGHT BEARING RESTRICTIONS No  FALLS:  Has patient fallen in last 6 months? No  LIVING ENVIRONMENT: Lives with: lives with their family Lives in: House/apartment Stairs: 4 stairs to enter, has rails but can use only one  OCCUPATION: [employer] work with sitting required.   PLOF: Independent, housework, yardwork  PATIENT GOALS   "get pain under control"      OBJECTIVE:   DIAGNOSTIC FINDINGS:  09/19/2021:  Multiple imaging over time, see in Epic chart  PATIENT SURVEYS:  09/19/2021 FOTO knee: 33, goal 47  SCREENING FOR RED FLAGS: 09/19/2021 Bowel or bladder incontinence: No Cauda equina syndrome: No  COGNITION: 09/19/2021 Overall cognitive status: Within functional limits for tasks assessed     SENSATION: 09/19/2021 unremarkable for changes  MUSCLE LENGTH: 09/19/2021 No specific tested today  POSTURE: 09/19/2021  Forward head posture, rounded shoulders, reduced  lumbar lordosis  PALPATION: 09/19/2021 General tenderness noted in Lt middle rhomboids, lower trap, infraspinatus.  Bilateral upper trap tenderness and increased tightness.  Mild tenderness Rt QL, inferior lumbar paraspinals.  Tenderness to touch anterior/lateral joint line Lt knee.   CERVICAL/ LUMBAR ROM:   Active  AROM  09/19/2021  Lumbar Flexion To patella  Lumbar  Extension 25 % in standing  Lumbar  Right lateral flexion To mid thigh c Rt lumbar pain  Lumbar  Left lateral flexion To mid thigh c Rt lumbar pain     Cervical flexion 30  Cervical extension 45  Cervical Lt rotation 45  Cervical Rt rotation 58   (Blank rows = not tested)  LOWER EXTREMITY ROM:      Right 09/19/2021 Left 09/19/2021  Hip flexion 95 Passive ROM in supine 95 Passive ROM in supine  Hip extension    Hip abduction    Hip adduction    Hip internal rotation    Hip external rotation 45 Passive ROM in supine 40 Passive ROM in supine  Knee flexion    Knee extension 0 AROM in seated LAQ 0 AROM in seated LAQ c pain noted  Ankle dorsiflexion    Ankle plantarflexion    Ankle inversion    Ankle eversion     (Blank rows = not tested)  LOWER EXTREMITY MMT:     09/19/2021:  General resisted static testing in supine hooklying : bilateral hip abd and add strong and painless MMT Right 09/19/2021 Left 09/19/2021  Hip flexion 5/5 5/5  Hip extension    Hip abduction    Hip adduction    Hip internal rotation    Hip external rotation    Knee flexion 5/5 5/5  Knee extension 5/5 4/5  Ankle dorsiflexion    Ankle plantarflexion    Ankle inversion    Ankle eversion     (Blank rows = not tested)  LUMBAR SPECIAL TESTS:  09/19/2021  Slump test Lt:     Rt:     Spurling's test neg bil.  FUNCTIONAL TESTS:   09/19/2021:    5x Sit to stand:  18.99 seconds    18 inch chair transfer:  able to perform without s UE  GAIT: 09/19/2021 Independent ambulation c mild deviation of reduced stance phase on Lt knee, mild forward  trunk lean.  Household based distances observed in clinic.     TODAY'S TREATMENT  09/30/21 Seated hamstring stretch 30 sec X 2 bilat Seated LAQ X15 alternating bilat Seated marches X15 alternating bilat Seated lumbar stretch pball roll outs 10 sec X10 DKTC 5 second holds X15 with feet on pball Ab set with pball in hooklying isometric shoulder extensions 5 sec hold X15 Lumbar rotation stretches 10x5 seconds B  Piriformis stretch 3x20 seconds B Supine clams X20 green band 2 second holds  PPT 2x10 3 second holds Standing hip abduction X10 bilat Standing rows green X15 bilat  Nu step L5 X 5 min UE/LE   09/26/21  Nustep L4 x6 minutes BLEs only  SKTC 5x5 second holds B Lumbar rotation stretches 5x5 seconds B  HS stretch 2x30 seconds B  Piriformis stretch 2x30 seconds B Bridges 2x10 2 second holds  Supine clams 2x10 red TB 2 second holds  PPT 2x10 3 second holds PPT with march 2x10   Grade I-II PAs L5-T10   09/19/2021 Therex:  HEP instruction/performance c cues for techniques, handout provided.  Trial set performed of each for comprehension and symptom assessment.  See below for exercise list.   PATIENT EDUCATION:  Education details: HEP, POC Person educated: Patient Education method: Explanation, Demonstration, Verbal cues, and Handouts Education comprehension: verbalized understanding, returned demonstration, and verbal cues  required   HOME EXERCISE PROGRAM: Access Code: [email or web address] URL: [email or web address] Date: 09/19/2021 Prepared by: Scot Jun  Exercises - Supine Lower Trunk Rotation  - 2-3 x daily - 7 x weekly - 1 sets - 3-5 reps - 30 hold - Supine Bridge  - 2 x daily - 7 x weekly - 2-3 sets - 10 reps - Supine Cervical Retraction with Towel  - 2-3 x daily - 7 x weekly - 2-3 sets - 10 reps - 5 hold - Supine Scapular Retraction  - 2-3 x daily - 7 x weekly - 2-3 sets - 10 reps - 5 hold - Seated Cervical Retraction  - 2-3 x daily - 7 x weekly - 3  sets - 10 reps - Seated Scapular Retraction  - 3-5 x daily - 7 x weekly - 2-3 sets - 10 reps - 5 hold  ASSESSMENT:  CLINICAL IMPRESSION: She has less overall back pain noted today, She does still have complaints of Rt hip and left knee pain today but had good overall tolerance to exercise progressions.   OBJECTIVE IMPAIRMENTS Abnormal gait, decreased activity tolerance, decreased coordination, decreased endurance, decreased mobility, difficulty walking, decreased ROM, decreased strength, hypomobility, impaired perceived functional ability, impaired flexibility, impaired UE functional use, improper body mechanics, postural dysfunction, and pain.   ACTIVITY LIMITATIONS carrying, lifting, bending, sitting, standing, squatting, sleeping, stairs, transfers, bed mobility, reach over head, and locomotion level  PARTICIPATION LIMITATIONS: meal prep, cleaning, laundry, interpersonal relationship, shopping, and community activity  PERSONAL FACTORS  Multiple treatment areas, length of time since onset, surgical history, DDD lumbar, depression, DM, HTN, hypothyroidism, obesity, arthritis. lumbar fusion and cervical fusion c4-   are also affecting patient's functional outcome.   REHAB POTENTIAL: Fair to good  CLINICAL DECISION MAKING: Evolving/moderate complexity  EVALUATION COMPLEXITY: Moderate   GOALS: Goals reviewed with patient? Yes  Short term PT Goals (target date for Short term goals are 3 weeks 10/10/2021) Patient will demonstrate independent use of home exercise program to maintain progress from in clinic treatments. Goal status: New   Long term PT goals (target dates for all long term goals are 10 weeks  11/28/2021 )  1. Patient will demonstrate/report pain at worst less than or equal to 2/10 to facilitate minimal limitation in daily activity secondary to pain symptoms. Goal status: New  2. Patient will demonstrate independent use of home exercise program to facilitate ability to  maintain/progress functional gains from skilled physical therapy services. Goal status: New  3. Patient will demonstrate FOTO knee outcome > or = 55 % to indicate reduced disability due to condition. Goal status: New  4.  Patient will demonstrate Lt LE MMT 5/5 throughout to facilitate ability to perform usual standing, walking, stairs at PLOF s limitation due to symptoms. Goal status: New  5.  Patient will demonstrate lumbar AROM > or = 75% WFL to facilitate upright standing/walking posture at PLOF.    Goal status: New  6.  Patient will demonstrate cervical AROM improvements > or = 10 degrees on average to facilitate improved cervical mobility c daily activity.   Goal status: New  7.  Patient will demonstrate/report ability to perform sitting > 1hr, standing and walking > 30 mins each for improvement in tolerance to static postures.  a.  Goal Status: New  8. Patient will demonstrate/report ascending/descending stairs s UE assist c reciprocal gait pattern for house entry.    PLAN: PT FREQUENCY: 1-2x/week  PT DURATION: 10 weeks  PLANNED INTERVENTIONS: Therapeutic exercises, Therapeutic activity,  Neuro Muscular re-education, Balance training, Gait training, Patient/Family education, Joint mobilization, Stair training, DME instructions, Dry Needling, Electrical stimulation, Cryotherapy, Moist heat, Taping, Ultrasound, Ionotophoresis '4mg'$ /ml Dexamethasone, and Manual therapy.  All included unless contraindicated.  PLAN FOR NEXT SESSION: monitor soreness from progressions last time.   Elsie Ra, PT, DPT 09/30/21 3:14 PM

## 2021-09-30 NOTE — Telephone Encounter (Signed)
She is seen yearly for RLS.   Note from visit on 08/21/21 (w/ Sarah):  -Under good control -Continue Requip XL 4 mg daily -Continue Requip 2 mg, 1/2 tablet twice a day -Also on gabapentin, dose was increased by orthopedics 600 mg twice daily, has been helpful  -Follow-up in 1 year or sooner if needed ___________________________________  Per chart, she is taking gabapentin '300mg'$ , two capsules twice daily. We will check with Judson Roch on managing this medication.

## 2021-09-30 NOTE — Telephone Encounter (Signed)
Pt said last office visit neurologist would take over prescribing Gabapentin., Pt would like refill for gabapentin (NEURONTIN) 300 MG capsule sent to [id number]

## 2021-10-01 ENCOUNTER — Encounter: Payer: Self-pay | Admitting: Family Medicine

## 2021-10-01 ENCOUNTER — Ambulatory Visit (INDEPENDENT_AMBULATORY_CARE_PROVIDER_SITE_OTHER): Payer: 59 | Admitting: Family Medicine

## 2021-10-01 ENCOUNTER — Other Ambulatory Visit: Payer: 59

## 2021-10-01 VITALS — BP 154/82 | HR 78 | Temp 98.4°F | Ht 63.0 in | Wt 266.0 lb

## 2021-10-01 DIAGNOSIS — S0006XA Insect bite (nonvenomous) of scalp, initial encounter: Secondary | ICD-10-CM | POA: Diagnosis not present

## 2021-10-01 DIAGNOSIS — W57XXXA Bitten or stung by nonvenomous insect and other nonvenomous arthropods, initial encounter: Secondary | ICD-10-CM | POA: Diagnosis not present

## 2021-10-01 DIAGNOSIS — R300 Dysuria: Secondary | ICD-10-CM | POA: Diagnosis not present

## 2021-10-01 DIAGNOSIS — R319 Hematuria, unspecified: Secondary | ICD-10-CM | POA: Diagnosis not present

## 2021-10-01 LAB — POCT URINALYSIS DIPSTICK
Bilirubin, UA: NEGATIVE
Blood, UA: POSITIVE
Glucose, UA: NEGATIVE
Ketones, UA: NEGATIVE
Leukocytes, UA: NEGATIVE
Nitrite, UA: NEGATIVE
Protein, UA: POSITIVE — AB
Spec Grav, UA: 1.025 (ref 1.010–1.025)
Urobilinogen, UA: 0.2 E.U./dL
pH, UA: 5.5 (ref 5.0–8.0)

## 2021-10-01 MED ORDER — PHENAZOPYRIDINE HCL 95 MG PO TABS: 95.0000 mg | ORAL_TABLET | Freq: Three times a day (TID) | ORAL | 0 refills | Status: DC | PRN

## 2021-10-01 MED ORDER — CEPHALEXIN 500 MG PO CAPS: 500.0000 mg | ORAL_CAPSULE | Freq: Three times a day (TID) | ORAL | 0 refills | Status: DC

## 2021-10-01 NOTE — Progress Notes (Signed)
[name] [name] , 1965-05-24, 56 y.o., female MRN: [id number] Patient Care Team    Relationship Specialty Notifications Start End  [name], DO PCP - General Family Medicine  07/07/16   Nahser, Wonda Cheng, MD PCP - Cardiology Cardiology Admissions 12/15/18   Ob/Gyn, [name]    07/08/16   Wilford Corner, MD Consulting Physician Gastroenterology  07/08/16   [name], MD (Inactive) Consulting Physician Neurology  07/08/16   [name], MD Attending Physician Endocrinology  07/08/16    Comment: Pt established with Si Raider, NP at this location. - Integrative med- thyroid  [name], MD Consulting Physician Rehabilitation  03/06/19    Comment: Neurosurgeon-spine and scoliosis specialist    Chief Complaint  Patient presents with   Hematuria    Pt c/o blood in urine, urine freq/urgency and dysuria  x 6 days; seen UC and started abx 3 days;      Subjective: Pt presents for an OV with complaints of hemturia of 6 days duration.  She states she had urinary urgency and burning with urination.  She was seen at the urgent care and prescribed Keflex twice daily.  She states they commented on her hematuria, they did not specifically state if she had leukocytes.  They did not send it for culture secondary to not having enough specimen.  She reports there may be very mild improvement in her symptoms since starting the medication 3 days ago, however she has not noticed a large difference in her symptoms.  She complains mostly today of bladder spasms/feeling like she has to go to the bathroom.  She denies fever, chills, nausea, vomit, low back pain.     07/15/2021    1:54 PM 04/29/2021   10:58 AM 03/18/2021    4:06 PM 10/02/2020    8:07 AM 01/18/2020    9:05 AM  Depression screen PHQ 2/9  Decreased Interest 1 0 0 1 0  Down, Depressed, Hopeless 1 0 0 1 0  PHQ - 2 Score 2 0 0 2 0  Altered sleeping 3  0 2   Tired, decreased energy 3  0 1   Change in appetite 3  0 1    Feeling bad or failure about yourself    0 0   Trouble concentrating 3  0 0   Moving slowly or fidgety/restless 0  0 0   Suicidal thoughts 0  0 0   PHQ-9 Score 14  0 6   Difficult doing work/chores   Not difficult at all      Allergies  Allergen Reactions   Bee Venom Swelling and Anaphylaxis   Yellow Jacket Venom Swelling   Biaxin [Clarithromycin] Diarrhea and Rash   Gabapentin Nausea And Vomiting    No reaction to Horizant   Robaxin [Methocarbamol] Swelling    Feet, chest pain, headache   Abilify [Aripiprazole] Other (See Comments)    MOOD SWING   Buspar [Buspirone] Diarrhea   Hydrochlorothiazide Rash   Lisinopril Cough   Maxzide [Triamterene-Hctz] Rash   Phentermine Other (See Comments)    MOOD CHANGE    Quetiapine Other (See Comments)    Felt funny MOOD SWINGS   Seroquel [Quetiapine Fumarate] Other (See Comments)    MOOD SWINGS   Social History   Social History Narrative   Patient is married [name])  2 children [name] and [place])   Patient is right handed.   Patient has a college education. 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    Patient drinks 1 cups daily. Uses herbal remedies, takes a daily vitamin.   Wears her seatbelt, smoke detector in the home.   Past Medical History:  Diagnosis Date   Acute bronchitis due to COVID-19 virus 09/06/2020   Anxiety 01/12/2013   Arthralgia 07/08/2016   Asthma    Atrial fibrillation ([place]) 12/06/2018   B12 deficiency 01/31/2018   Chicken pox    Chronic insomnia 11/24/2016   Clostridium difficile infection 04/02/2014, 02/06/2014   Complex tear of medial meniscus of left knee as current injury 12/01/2018   Complex tear of medial meniscus of left knee as current injury 12/01/2018   DDD (degenerative disc disease), lumbar 09/09/2018   Depression    Diabetes mellitus without complication ([place]) 81/03/9145   Dyslipidemia    Elevated hemoglobin A1c 07/08/2016   Estrogen deficiency 12/06/2018   Foraminal stenosis of lumbar region 09/09/2018   Hair loss  07/20/2017   History of lumbar fusion 09/09/2018   Hormone replacement therapy (HRT) 07/20/2017   HTN (hypertension)    Hypertension 07/08/2016   Hypokalemia 12/06/2018   Hypothyroidism    Left medial tibial plateau fracture 11/10/2018   Lumbar radiculopathy 09/09/2018   Referred to [facility] 09/09/2018   Migraine    Mild memory disturbance 11/13/2013   Morbid obesity ([place]) 07/08/2016   Multiple allergies    Obesity    Rash associated with COVID-19 10/02/2020   Restless legs syndrome (RLS) 01/12/2013   Follows with Dr. Jannifer Franklin who prescribes Requip and gabapentin   RLS (restless legs syndrome)    Vitamin D deficiency 12/06/2018   Wears glasses 07/08/2016   Past Surgical History:  Procedure Laterality Date   BREAST BIOPSY  8295,[id number]   x 2 benign lesion   CERVICAL DISCECTOMY  2003   CERVICAL FUSION  2010   x2   Hand fracture surgery     LUMBAR FUSION  2016   VAGINAL HYSTERECTOMY  2009   Family History  Problem Relation Age of Onset   Hypertension Father    Diabetes Father    Heart disease Father    Hearing loss Father    Diabetes Brother    Liver cancer Mother    COPD Mother    Mental illness Mother    Diabetes Mother    Hearing loss Mother    Heart disease Mother    Arthritis/Rheumatoid Mother    Allergies as of 10/01/2021       Reactions   Bee Venom Swelling, Anaphylaxis   Yellow Jacket Venom Swelling   Biaxin [clarithromycin] Diarrhea, Rash   Gabapentin Nausea And Vomiting   No reaction to Horizant   Robaxin [methocarbamol] Swelling   Feet, chest pain, headache   Abilify [aripiprazole] Other (See Comments)   MOOD SWING   Buspar [buspirone] Diarrhea   Hydrochlorothiazide Rash   Lisinopril Cough   Maxzide [triamterene-hctz] Rash   Phentermine Other (See Comments)   MOOD CHANGE   Quetiapine Other (See Comments)   Felt funny MOOD SWINGS   Seroquel [quetiapine Fumarate] Other (See Comments)   MOOD SWINGS        Medication List        Accurate as of October 01, 2021  3:44  PM. If you have any questions, ask your nurse or doctor.          acyclovir 800 MG tablet Commonly known as: ZOVIRAX Take 800 mg by mouth 5 (five) times daily.   albuterol 108 (90 Base) MCG/ACT inhaler Commonly known as: VENTOLIN  HFA Inhale 2 puffs into the lungs every 6 (six) hours as needed for wheezing or shortness of breath.   buPROPion 300 MG 24 hr tablet Commonly known as: WELLBUTRIN XL Take 1 tablet (300 mg total) by mouth daily.   cephALEXin 500 MG capsule Commonly known as: KEFLEX Take 1 capsule (500 mg total) by mouth 3 (three) times daily.   cholecalciferol 1000 units tablet Commonly known as: VITAMIN D Take 5,000 Units by mouth daily.   clobetasol ointment 0.05 % Commonly known as: TEMOVATE Apply 1 application topically 2 (two) times daily.   DULoxetine 60 MG capsule Commonly known as: CYMBALTA Take 1 capsule (60 mg total) by mouth daily.   Eliquis 5 MG Tabs tablet Generic drug: apixaban TAKE 1 TABLET BY MOUTH TWICE A DAY   estradiol 2 MG tablet Commonly known as: ESTRACE Take 1 tablet (2 mg total) by mouth daily.   gabapentin 300 MG capsule Commonly known as: NEURONTIN Take 2 capsules (600 mg total) by mouth 2 (two) times daily.   HYDROcodone-acetaminophen 5-325 MG tablet Commonly known as: NORCO/VICODIN Take 1 tablet by mouth every 8 (eight) hours as needed.   levothyroxine 150 MCG tablet Commonly known as: SYNTHROID Take 1 tablet (150 mcg total) by mouth daily.   magnesium oxide 400 MG tablet Commonly known as: MAG-OX Take 1 tablet (400 mg total) by mouth daily.   meloxicam 7.5 MG tablet Commonly known as: MOBIC Take 1 tablet (7.5 mg total) by mouth daily.   metoprolol succinate 25 MG 24 hr tablet Commonly known as: TOPROL-XL Take 0.5 tablets (12.5 mg total) by mouth daily.   phenazopyridine 95 MG tablet Commonly known as: PYRIDIUM Take 1 tablet (95 mg total) by mouth 3 (three) times daily as needed for pain. Started by: Howard Pouch, DO   Potassium Chloride ER 20 MEQ Tbcr Take 20 mEq by mouth daily.   progesterone 100 MG capsule Commonly known as: PROMETRIUM Take 1 capsule (100 mg total) by mouth daily.   rOPINIRole 2 MG tablet Commonly known as: REQUIP Take 1/2 tablet at lunch and 1/2 tablet at bed time.   rOPINIRole 4 MG 24 hr tablet Commonly known as: REQUIP XL Take 1 tablet (4 mg total) by mouth daily.   telmisartan 80 MG tablet Commonly known as: Micardis Take 1 tablet (80 mg total) by mouth daily.        All past medical history, surgical history, allergies, family history, immunizations andmedications were updated in the EMR today and reviewed under the history and medication portions of their EMR.     ROS Negative, with the exception of above mentioned in HPI   Objective:  BP (!) 154/82   Pulse 78   Temp 98.4 F (36.9 C) (Oral)   Ht '5\' 3"'$  (1.6 m)   Wt 266 lb (120.7 kg)   SpO2 97%   BMI 47.12 kg/m  Body mass index is 47.12 kg/m. Physical Exam Vitals and nursing note reviewed.  Constitutional:      General: She is not in acute distress.    Appearance: Normal appearance. She is not ill-appearing, toxic-appearing or diaphoretic.  HENT:     Head: Normocephalic and atraumatic.  Eyes:     General: No scleral icterus.       Right eye: No discharge.        Left eye: No discharge.     Extraocular Movements: Extraocular movements intact.     Conjunctiva/sclera: Conjunctivae normal.     Pupils: Pupils are  equal, round, and reactive to light.  Cardiovascular:     Rate and Rhythm: Normal rate.  Abdominal:     General: Bowel sounds are normal.     Palpations: Abdomen is soft.     Tenderness: There is no abdominal tenderness. There is no right CVA tenderness, left CVA tenderness, guarding or rebound.  Musculoskeletal:     Cervical back: Neck supple.  Skin:    General: Skin is warm and dry.     Coloration: Skin is not jaundiced or pale.     Findings: No rash.  Neurological:      Mental Status: She is alert and oriented to person, place, and time. Mental status is at baseline.     Motor: No weakness.     Gait: Gait normal.  Psychiatric:        Mood and Affect: Mood normal.        Behavior: Behavior normal.        Thought Content: Thought content normal.        Judgment: Judgment normal.      No results found. No results found. Results for orders placed or performed in visit on 10/01/21 (from the past 24 hour(s))  POCT Urinalysis Dipstick     Status: Abnormal   Collection Time: 10/01/21  3:19 PM  Result Value Ref Range   Color, UA yellow    Clarity, UA clear    Glucose, UA Negative Negative   Bilirubin, UA negative    Ketones, UA negative    Spec Grav, UA 1.025 1.010 - 1.025   Blood, UA positive    pH, UA 5.5 5.0 - 8.0   Protein, UA Positive (A) Negative   Urobilinogen, UA 0.2 0.2 or 1.0 E.U./dL   Nitrite, UA negative    Leukocytes, UA Negative Negative   Appearance clear    Odor yes     Assessment/Plan: [name] [name] is a 56 y.o. female present for OV for  Dysuria/hematuria Urine with hematuria today, no leukocytes or nitrates. She is complaining mostly of bladder spasms at this time. Pyridium prescribed, if not covered or too expensive at pharmacy can use Azo that has Pyridium ingredient Hydrate Continue Keflex at 3 times daily dosing - POCT Urinalysis Dipstick - Urinalysis w microscopic + reflex cultur Sending for urine culture to ensure appropriate treatment initiated.  Also discussed with patient that she is prescribed Eliquis and she does elect to take low-dose Mobic.  It is possible that her bladder irritation could be coming from something other than a bacterial infection and she is seeing the bleeding secondary to the Eliquis/Mobic combo.  Tick bite of scalp, initial encounter - B. burgdorfi antibodies (follow up labs collected today)  Reviewed expectations re: course of current medical issues. Discussed self-management of  symptoms. Outlined signs and symptoms indicating need for more acute intervention. Patient verbalized understanding and all questions were answered. Patient received an After-Visit Summary.    Orders Placed This Encounter  Procedures   Urinalysis w microscopic + reflex cultur   POCT Urinalysis Dipstick   Meds ordered this encounter  Medications   cephALEXin (KEFLEX) 500 MG capsule    Sig: Take 1 capsule (500 mg total) by mouth 3 (three) times daily.    Dispense:  21 capsule    Refill:  0   phenazopyridine (PYRIDIUM) 95 MG tablet    Sig: Take 1 tablet (95 mg total) by mouth 3 (three) times daily as needed for pain.    Dispense:  10 tablet    Refill:  0   Referral Orders  No referral(s) requested today     Note is dictated utilizing voice recognition software. Although note has been proof read prior to signing, occasional typographical errors still can be missed. If any questions arise, please do not hesitate to call for verification.   electronically signed by:  Howard Pouch, DO  [place]

## 2021-10-01 NOTE — Patient Instructions (Addendum)

## 2021-10-02 ENCOUNTER — Encounter: Payer: 59 | Admitting: Rehabilitative and Restorative Service Providers"

## 2021-10-02 ENCOUNTER — Telehealth: Payer: Self-pay | Admitting: Family Medicine

## 2021-10-02 LAB — URINALYSIS W MICROSCOPIC + REFLEX CULTURE
Bacteria, UA: NONE SEEN /HPF
Bilirubin Urine: NEGATIVE
Glucose, UA: NEGATIVE
Hgb urine dipstick: NEGATIVE
Hyaline Cast: NONE SEEN /LPF
Ketones, ur: NEGATIVE
Leukocyte Esterase: NEGATIVE
Nitrites, Initial: NEGATIVE
Protein, ur: NEGATIVE
Specific Gravity, Urine: 1.02 (ref 1.001–1.035)
WBC, UA: NONE SEEN /HPF (ref 0–5)
pH: 5.5 (ref 5.0–8.0)

## 2021-10-02 LAB — NO CULTURE INDICATED

## 2021-10-02 LAB — B. BURGDORFI ANTIBODIES: B burgdorferi Ab IgG+IgM: <0.9 index

## 2021-10-02 NOTE — Telephone Encounter (Signed)
Spoke with pt regarding labs and instructions.   

## 2021-10-02 NOTE — Telephone Encounter (Signed)
Please inform patient Her urinalysis did have a small amount of red blood cells on laboratory microanalysis.  It did not show any bacteria therefore culture was not indicated. This could mean that the antibiotic they initiated at the urgent care was working enough to suppress the bacterial load.  Therefore I recommend she continue the Keflex every 8 hours for a total of 7 days.  I would also encourage her to stop the meloxicam for a week, if she can.  Hydrate well over this time.  If symptoms are not resolved after 2 weeks, follow-up . If symptoms are resolved, we will still recheck her urine for any blood at her next upcoming appointment in October.

## 2021-10-07 ENCOUNTER — Encounter: Payer: Self-pay | Admitting: Physical Therapy

## 2021-10-07 ENCOUNTER — Ambulatory Visit (INDEPENDENT_AMBULATORY_CARE_PROVIDER_SITE_OTHER): Payer: 59 | Admitting: Physical Therapy

## 2021-10-07 DIAGNOSIS — M6281 Muscle weakness (generalized): Secondary | ICD-10-CM

## 2021-10-07 DIAGNOSIS — M5459 Other low back pain: Secondary | ICD-10-CM

## 2021-10-07 DIAGNOSIS — G8929 Other chronic pain: Secondary | ICD-10-CM

## 2021-10-07 DIAGNOSIS — M542 Cervicalgia: Secondary | ICD-10-CM

## 2021-10-07 DIAGNOSIS — R293 Abnormal posture: Secondary | ICD-10-CM | POA: Diagnosis not present

## 2021-10-07 DIAGNOSIS — M25562 Pain in left knee: Secondary | ICD-10-CM

## 2021-10-07 DIAGNOSIS — R262 Difficulty in walking, not elsewhere classified: Secondary | ICD-10-CM

## 2021-10-07 NOTE — Therapy (Signed)
OUTPATIENT PHYSICAL THERAPY TREATMENT   Patient Name: [name] [name] MRN: [id number] DOB:1965-03-07, 56 y.o., female Today's Date: 10/07/2021   PT End of Session - 10/07/21 1436     Visit Number 4    Number of Visits 20    Date for PT Re-Evaluation 11/28/21    Authorization Type [employer] no copay    Progress Note Due on Visit 10    PT Start Time 1435    PT Stop Time 1515    PT Time Calculation (min) 40 min    Activity Tolerance Patient tolerated treatment well;No increased pain    Behavior During Therapy WFL for tasks assessed/performed               Past Medical History:  Diagnosis Date   Acute bronchitis due to COVID-19 virus 09/06/2020   Anxiety 01/12/2013   Arthralgia 07/08/2016   Asthma    Atrial fibrillation ([place]) 12/06/2018   B12 deficiency 01/31/2018   Chicken pox    Chronic insomnia 11/24/2016   Clostridium difficile infection 04/02/2014, 02/06/2014   Complex tear of medial meniscus of left knee as current injury 12/01/2018   Complex tear of medial meniscus of left knee as current injury 12/01/2018   DDD (degenerative disc disease), lumbar 09/09/2018   Depression    Diabetes mellitus without complication ([place]) 29/05/7652   Dyslipidemia    Elevated hemoglobin A1c 07/08/2016   Estrogen deficiency 12/06/2018   Foraminal stenosis of lumbar region 09/09/2018   Hair loss 07/20/2017   History of lumbar fusion 09/09/2018   Hormone replacement therapy (HRT) 07/20/2017   HTN (hypertension)    Hypertension 07/08/2016   Hypokalemia 12/06/2018   Hypothyroidism    Left medial tibial plateau fracture 11/10/2018   Lumbar radiculopathy 09/09/2018   Referred to NS 09/09/2018   Migraine    Mild memory disturbance 11/13/2013   Morbid obesity ([place]) 07/08/2016   Multiple allergies    Obesity    Rash associated with COVID-19 10/02/2020   Restless legs syndrome (RLS) 01/12/2013   Follows with Dr. Jannifer Franklin who prescribes Requip and gabapentin   RLS (restless legs syndrome)    Vitamin D  deficiency 12/06/2018   Wears glasses 07/08/2016   Past Surgical History:  Procedure Laterality Date   BREAST BIOPSY  6503,5465   x 2 benign lesion   CERVICAL DISCECTOMY  2003   CERVICAL FUSION  2010   x2   Hand fracture surgery     LUMBAR FUSION  2016   VAGINAL HYSTERECTOMY  2009   Patient Active Problem List   Diagnosis Date Noted   Primary osteoarthritis of left knee 08/05/2021   Cervical fusion syndrome 07/09/2021   Chronic pain syndrome 07/09/2021   Myofascial pain syndrome of thoracic spine 05/29/2021   Cervical radiculopathy 05/29/2021   Chicken pox 04/21/2021   Asthma 04/21/2021   Stress fracture of rib 04/15/2021   Scapular dysfunction 04/02/2021   Effusion, left shoulder 03/26/2021   AC joint derangement 03/26/2021   BMI 45.0-49.9, adult ([place]) 03/26/2021   Neuropathy 02/18/2021   Multiple allergies 10/02/2020   Acquired thrombophilia (HCC)-eliquis 12/20/2019   Hypokalemia 12/06/2018   Atrial fibrillation ([place]) 12/06/2018   Vitamin D deficiency 12/06/2018   Estrogen deficiency 12/06/2018   Clostridium difficile infection 12/01/2018   DDD (degenerative disc disease), lumbar 09/09/2018   Foraminal stenosis of lumbar region 09/09/2018   History of lumbar fusion 09/09/2018   Lumbar radiculopathy 09/09/2018   B12 deficiency 01/31/2018   Hormone replacement therapy (HRT) 07/20/2017  Chronic insomnia 11/24/2016   Elevated hemoglobin A1c 07/08/2016   Hypertension 07/08/2016   Arthralgia 07/08/2016   Morbid obesity ([place]) 07/08/2016   Hypothyroidism    Dyslipidemia    Depression    Migraine 11/13/2013   Restless legs syndrome (RLS) 01/12/2013   Anxiety 01/12/2013    PCP: Howard Pouch DO  REFERRING PROVIDER: Marybelle Killings, MD   REFERRING DIAG: [phone] (ICD-10-CM) - Primary osteoarthritis of left knee Z98.1 (ICD-10-CM) - History of lumbar fusion M54.2 (ICD-10-CM) - Neck pain  Rationale for Evaluation and Treatment Rehabilitation  THERAPY DIAG:  Other low  back pain  Cervicalgia  Chronic pain of left knee  Abnormal posture  Muscle weakness (generalized)  Difficulty in walking, not elsewhere classified  ONSET DATE: 03/02/2021  SUBJECTIVE:                                                                                                                                                                                           SUBJECTIVE STATEMENT: My left knee is killing me, but my back and hip are feeling better.  PERTINENT HISTORY:  DDD lumbar, depression, DM, HTN, hypothyroidism, obesity, arthritis. lumbar fusion and cervical fusion c4-7  PAIN:  NPRS scale: 7/10 pain in left knee Pain location: Back, neck/shoulders, Lt knee Pain description: back: consistent, dull, intense   neck: shooting   Lt knee: giving way/intense - sharp Aggravating factors:  carrying items, housework,  standing/walking prolonged, stairs, squatting. Bed mobility turning, sitting prolonged  Relieving factors: massage at home, heat pad, cervical traction over door.    PRECAUTIONS: history of lumbar and cervical fusions  WEIGHT BEARING RESTRICTIONS No  FALLS:  Has patient fallen in last 6 months? No  LIVING ENVIRONMENT: Lives with: lives with their family Lives in: House/apartment Stairs: 4 stairs to enter, has rails but can use only one  OCCUPATION: [employer] work with sitting required.   PLOF: Independent, housework, yardwork  PATIENT GOALS   "get pain under control"      OBJECTIVE:   DIAGNOSTIC FINDINGS:  09/19/2021:  Multiple imaging over time, see in Epic chart  PATIENT SURVEYS:  09/19/2021 FOTO knee: 33, goal 29  SCREENING FOR RED FLAGS: 09/19/2021 Bowel or bladder incontinence: No Cauda equina syndrome: No  COGNITION: 09/19/2021 Overall cognitive status: Within functional limits for tasks assessed     SENSATION: 09/19/2021 unremarkable for changes  MUSCLE LENGTH: 09/19/2021 No specific tested today  POSTURE: 09/19/2021  Forward  head posture, rounded shoulders, reduced lumbar lordosis  PALPATION: 09/19/2021 General tenderness noted in Lt middle rhomboids, lower trap, infraspinatus.  Bilateral upper trap tenderness and increased tightness.  Mild tenderness Rt QL,  inferior lumbar paraspinals.    Tenderness to touch anterior/lateral joint line Lt knee.   CERVICAL/ LUMBAR ROM:   Active  AROM  09/19/2021  Lumbar Flexion To patella  Lumbar  Extension 25 % in standing  Lumbar  Right lateral flexion To mid thigh c Rt lumbar pain  Lumbar  Left lateral flexion To mid thigh c Rt lumbar pain     Cervical flexion 30  Cervical extension 45  Cervical Lt rotation 45  Cervical Rt rotation 58   (Blank rows = not tested)  LOWER EXTREMITY ROM:      Right 09/19/2021 Left 09/19/2021  Hip flexion 95 Passive ROM in supine 95 Passive ROM in supine  Hip extension    Hip abduction    Hip adduction    Hip internal rotation    Hip external rotation 45 Passive ROM in supine 40 Passive ROM in supine  Knee flexion    Knee extension 0 AROM in seated LAQ 0 AROM in seated LAQ c pain noted  Ankle dorsiflexion    Ankle plantarflexion    Ankle inversion    Ankle eversion     (Blank rows = not tested)  LOWER EXTREMITY MMT:     09/19/2021:  General resisted static testing in supine hooklying : bilateral hip abd and add strong and painless MMT Right 09/19/2021 Left 09/19/2021  Hip flexion 5/5 5/5  Hip extension    Hip abduction    Hip adduction    Hip internal rotation    Hip external rotation    Knee flexion 5/5 5/5  Knee extension 5/5 4/5  Ankle dorsiflexion    Ankle plantarflexion    Ankle inversion    Ankle eversion     (Blank rows = not tested)  LUMBAR SPECIAL TESTS:  09/19/2021  Slump test Lt:     Rt:     Spurling's test neg bil.  FUNCTIONAL TESTS:   09/19/2021:    5x Sit to stand:  18.99 seconds    18 inch chair transfer:  able to perform without s UE  GAIT: 09/19/2021 Independent ambulation c mild deviation of  reduced stance phase on Lt knee, mild forward trunk lean.  Household based distances observed in clinic.     TODAY'S TREATMENT  10/06/21 Nu step L5 X 3 min UE/LE (stopped early due to left knee pain) Seated knee flexion AAROM stretch 5 sec X15 for left  Seated LAQ 2# X 15 alternating bilat Seated hamstring curl green X 15 each side Seated lumbar stretch pball roll outs 5sec X10 DKTC 5 second holds X15 with feet on pball Ab set with pball in hooklying isometric shoulder extensions 5 sec hold X15 Lumbar rotation stretches 5x10s seconds B  Piriformis stretch 3x20 seconds B Supine clams X20 green band 2 second holds  PPT X15 3 second holds Standing bilat shoulder flexion AAROM with 1# bar X10 Standing shoulder bilat abd AROM X10 Standing bilat shoulder ER with red X15   09/30/21 Seated hamstring stretch 30 sec X 2 bilat Seated LAQ X15 alternating bilat Seated marches X15 alternating bilat Seated lumbar stretch pball roll outs 10 sec X10 DKTC 5 second holds X15 with feet on pball Ab set with pball in hooklying isometric shoulder extensions 5 sec hold X15 Lumbar rotation stretches 10x5 seconds B  Piriformis stretch 3x20 seconds B Supine clams X20 green band 2 second holds  PPT 2x10 3 second holds Standing hip abduction X10 bilat Standing rows green X15 bilat  Nu step L5  X 5 min UE/LE   09/26/21  Nustep L4 x6 minutes BLEs only  SKTC 5x5 second holds B Lumbar rotation stretches 5x5 seconds B  HS stretch 2x30 seconds B  Piriformis stretch 2x30 seconds B Bridges 2x10 2 second holds  Supine clams 2x10 red TB 2 second holds  PPT 2x10 3 second holds PPT with march 2x10   Grade I-II PAs L5-T10   09/19/2021 Therex:  HEP instruction/performance c cues for techniques, handout provided.  Trial set performed of each for comprehension and symptom assessment.  See below for exercise list.   PATIENT EDUCATION:  Education details: HEP, POC Person educated: Patient Education method:  Explanation, Demonstration, Verbal cues, and Handouts Education comprehension: verbalized understanding, returned demonstration, and verbal cues required   HOME EXERCISE PROGRAM: Access Code: [email or web address] URL: [email or web address] Date: 09/19/2021 Prepared by: Scot Jun  Exercises - Supine Lower Trunk Rotation  - 2-3 x daily - 7 x weekly - 1 sets - 3-5 reps - 30 hold - Supine Bridge  - 2 x daily - 7 x weekly - 2-3 sets - 10 reps - Supine Cervical Retraction with Towel  - 2-3 x daily - 7 x weekly - 2-3 sets - 10 reps - 5 hold - Supine Scapular Retraction  - 2-3 x daily - 7 x weekly - 2-3 sets - 10 reps - 5 hold - Seated Cervical Retraction  - 2-3 x daily - 7 x weekly - 3 sets - 10 reps - Seated Scapular Retraction  - 3-5 x daily - 7 x weekly - 2-3 sets - 10 reps - 5 hold  ASSESSMENT:  CLINICAL IMPRESSION: Back and Rt hip pain were better today, but left knee pain limited her some with exercises today. We will continue to work to improve strength and mobility to improve function as able. She does also complain of bilat shoulder weakness so I gave her other exercises for this as well today.  OBJECTIVE IMPAIRMENTS Abnormal gait, decreased activity tolerance, decreased coordination, decreased endurance, decreased mobility, difficulty walking, decreased ROM, decreased strength, hypomobility, impaired perceived functional ability, impaired flexibility, impaired UE functional use, improper body mechanics, postural dysfunction, and pain.   ACTIVITY LIMITATIONS carrying, lifting, bending, sitting, standing, squatting, sleeping, stairs, transfers, bed mobility, reach over head, and locomotion level  PARTICIPATION LIMITATIONS: meal prep, cleaning, laundry, interpersonal relationship, shopping, and community activity  PERSONAL FACTORS  Multiple treatment areas, length of time since onset, surgical history, DDD lumbar, depression, DM, HTN, hypothyroidism, obesity, arthritis. lumbar  fusion and cervical fusion c4-   are also affecting patient's functional outcome.   REHAB POTENTIAL: Fair to good  CLINICAL DECISION MAKING: Evolving/moderate complexity  EVALUATION COMPLEXITY: Moderate   GOALS: Goals reviewed with patient? Yes  Short term PT Goals (target date for Short term goals are 3 weeks 10/10/2021) Patient will demonstrate independent use of home exercise program to maintain progress from in clinic treatments. Goal status: New   Long term PT goals (target dates for all long term goals are 10 weeks  11/28/2021 )  1. Patient will demonstrate/report pain at worst less than or equal to 2/10 to facilitate minimal limitation in daily activity secondary to pain symptoms. Goal status: New  2. Patient will demonstrate independent use of home exercise program to facilitate ability to maintain/progress functional gains from skilled physical therapy services. Goal status: New  3. Patient will demonstrate FOTO knee outcome > or = 55 % to indicate reduced disability due to condition. Goal status: New  4.  Patient  will demonstrate Lt LE MMT 5/5 throughout to facilitate ability to perform usual standing, walking, stairs at PLOF s limitation due to symptoms. Goal status: New  5.  Patient will demonstrate lumbar AROM > or = 75% WFL to facilitate upright standing/walking posture at PLOF.    Goal status: New  6.  Patient will demonstrate cervical AROM improvements > or = 10 degrees on average to facilitate improved cervical mobility c daily activity.   Goal status: New  7.  Patient will demonstrate/report ability to perform sitting > 1hr, standing and walking > 30 mins each for improvement in tolerance to static postures.  a.  Goal Status: New  8. Patient will demonstrate/report ascending/descending stairs s UE assist c reciprocal gait pattern for house entry.    PLAN: PT FREQUENCY: 1-2x/week  PT DURATION: 10 weeks  PLANNED INTERVENTIONS: Therapeutic exercises,  Therapeutic activity, Neuro Muscular re-education, Balance training, Gait training, Patient/Family education, Joint mobilization, Stair training, DME instructions, Dry Needling, Electrical stimulation, Cryotherapy, Moist heat, Taping, Ultrasound, Ionotophoresis '4mg'$ /ml Dexamethasone, and Manual therapy.  All included unless contraindicated.  PLAN FOR NEXT SESSION: monitor soreness from progressions last time and progress general strength and mobility as able  Elsie Ra, PT, DPT 10/07/21 2:36 PM

## 2021-10-09 ENCOUNTER — Encounter: Payer: Self-pay | Admitting: Rehabilitative and Restorative Service Providers"

## 2021-10-09 ENCOUNTER — Ambulatory Visit (INDEPENDENT_AMBULATORY_CARE_PROVIDER_SITE_OTHER): Payer: 59 | Admitting: Rehabilitative and Restorative Service Providers"

## 2021-10-09 DIAGNOSIS — R293 Abnormal posture: Secondary | ICD-10-CM | POA: Diagnosis not present

## 2021-10-09 DIAGNOSIS — M6281 Muscle weakness (generalized): Secondary | ICD-10-CM

## 2021-10-09 DIAGNOSIS — M542 Cervicalgia: Secondary | ICD-10-CM

## 2021-10-09 DIAGNOSIS — R262 Difficulty in walking, not elsewhere classified: Secondary | ICD-10-CM

## 2021-10-09 DIAGNOSIS — G8929 Other chronic pain: Secondary | ICD-10-CM

## 2021-10-09 DIAGNOSIS — M25562 Pain in left knee: Secondary | ICD-10-CM | POA: Diagnosis not present

## 2021-10-09 DIAGNOSIS — M5459 Other low back pain: Secondary | ICD-10-CM

## 2021-10-09 NOTE — Therapy (Signed)
OUTPATIENT PHYSICAL THERAPY TREATMENT & PROGRESS NOTE   Patient Name: [name] [name] MRN: [id number] DOB:12-09-1965, 56 y.o., female Today's Date: 10/09/2021  Progress Note Reporting Period 09/19/21 to 10/09/21  See note below for Objective Data and Assessment of Progress/Goals.    End of Session:  PT End of Session - 10/09/21 1508     Visit Number 5    Number of Visits 20    Date for PT Re-Evaluation 11/28/21    Authorization Type [employer] no copay    Progress Note Due on Visit 15    PT Start Time 1513    PT Stop Time 1602    PT Time Calculation (min) 49 min    Activity Tolerance Patient tolerated treatment well;No increased pain    Behavior During Therapy WFL for tasks assessed/performed             Past Medical History:  Diagnosis Date   Acute bronchitis due to COVID-19 virus 09/06/2020   Anxiety 01/12/2013   Arthralgia 07/08/2016   Asthma    Atrial fibrillation ([place]) 12/06/2018   B12 deficiency 01/31/2018   Chicken pox    Chronic insomnia 11/24/2016   Clostridium difficile infection 04/02/2014, 02/06/2014   Complex tear of medial meniscus of left knee as current injury 12/01/2018   Complex tear of medial meniscus of left knee as current injury 12/01/2018   DDD (degenerative disc disease), lumbar 09/09/2018   Depression    Diabetes mellitus without complication ([place]) 17/07/1023   Dyslipidemia    Elevated hemoglobin A1c 07/08/2016   Estrogen deficiency 12/06/2018   Foraminal stenosis of lumbar region 09/09/2018   Hair loss 07/20/2017   History of lumbar fusion 09/09/2018   Hormone replacement therapy (HRT) 07/20/2017   HTN (hypertension)    Hypertension 07/08/2016   Hypokalemia 12/06/2018   Hypothyroidism    Left medial tibial plateau fracture 11/10/2018   Lumbar radiculopathy 09/09/2018   Referred to NS 09/09/2018   Migraine    Mild memory disturbance 11/13/2013   Morbid obesity ([place]) 07/08/2016   Multiple allergies    Obesity    Rash associated with COVID-19 10/02/2020    Restless legs syndrome (RLS) 01/12/2013   Follows with Dr. Jannifer Franklin who prescribes Requip and gabapentin   RLS (restless legs syndrome)    Vitamin D deficiency 12/06/2018   Wears glasses 07/08/2016   Past Surgical History:  Procedure Laterality Date   BREAST BIOPSY  8527,7824   x 2 benign lesion   CERVICAL DISCECTOMY  2003   CERVICAL FUSION  2010   x2   Hand fracture surgery     LUMBAR FUSION  2016   VAGINAL HYSTERECTOMY  2009   Patient Active Problem List   Diagnosis Date Noted   Primary osteoarthritis of left knee 08/05/2021   Cervical fusion syndrome 07/09/2021   Chronic pain syndrome 07/09/2021   Myofascial pain syndrome of thoracic spine 05/29/2021   Cervical radiculopathy 05/29/2021   Chicken pox 04/21/2021   Asthma 04/21/2021   Stress fracture of rib 04/15/2021   Scapular dysfunction 04/02/2021   Effusion, left shoulder 03/26/2021   AC joint derangement 03/26/2021   BMI 45.0-49.9, adult ([place]) 03/26/2021   Neuropathy 02/18/2021   Multiple allergies 10/02/2020   Acquired thrombophilia ([facility])-eliquis 12/20/2019   Hypokalemia 12/06/2018   Atrial fibrillation ([place]) 12/06/2018   Vitamin D deficiency 12/06/2018   Estrogen deficiency 12/06/2018   Clostridium difficile infection 12/01/2018   DDD (degenerative disc disease), lumbar 09/09/2018   Foraminal stenosis of lumbar region 09/09/2018  History of lumbar fusion 09/09/2018   Lumbar radiculopathy 09/09/2018   B12 deficiency 01/31/2018   Hormone replacement therapy (HRT) 07/20/2017   Chronic insomnia 11/24/2016   Elevated hemoglobin A1c 07/08/2016   Hypertension 07/08/2016   Arthralgia 07/08/2016   Morbid obesity ([place]) 07/08/2016   Hypothyroidism    Dyslipidemia    Depression    Migraine 11/13/2013   Restless legs syndrome (RLS) 01/12/2013   Anxiety 01/12/2013    PCP: Howard Pouch DO  REFERRING PROVIDER: Marybelle Killings, MD   REFERRING DIAG: [phone] (ICD-10-CM) - Primary osteoarthritis of left knee Z98.1  (ICD-10-CM) - History of lumbar fusion M54.2 (ICD-10-CM) - Neck pain  Rationale for Evaluation and Treatment Rehabilitation  THERAPY DIAG:  Other low back pain  Cervicalgia  Chronic pain of left knee  Abnormal posture  Muscle weakness (generalized)  Difficulty in walking, not elsewhere classified  ONSET DATE: 03/02/2021  SUBJECTIVE:                                                                                                                                                                                           SUBJECTIVE STATEMENT: GROC since evaluation for Low Back: +3 Neck: -3 Lt Knee: -5 Since her last appointment, she has had high knee pain but is doing well with the back and hip. She came in today with noted knee and neck pain. She reported an increase in knee pain with piriformis stretch. She asked about aerobics and Pilates class for strengthening, and about use of cervical traction.  PERTINENT HISTORY:  DDD lumbar, depression, DM, HTN, hypothyroidism, obesity, arthritis. lumbar fusion and cervical fusion c4-7  PAIN:  NPRS scale: 8/10 pain in left knee, 5/10 pain in neck  Pain location: Back, neck/shoulders, Lt knee Pain description: back: consistent, dull, intense   neck: shooting   Lt knee: giving way/intense - sharp Aggravating factors:  carrying items, housework,  standing/walking prolonged, stairs, squatting. Bed mobility turning, sitting prolonged  Relieving factors: massage at home, heat pad, cervical traction over door.    PRECAUTIONS: history of lumbar and cervical fusions  WEIGHT BEARING RESTRICTIONS No  FALLS:  Has patient fallen in last 6 months? No  LIVING ENVIRONMENT: Lives with: lives with their family Lives in: House/apartment Stairs: 4 stairs to enter, has rails but can use only one  OCCUPATION: Dispatch work with sitting required.   PLOF: Independent, housework, yardwork  PATIENT GOALS   "get pain under control"      OBJECTIVE:    DIAGNOSTIC FINDINGS:  09/19/2021:  Multiple imaging over time, see in Epic chart  PATIENT SURVEYS:  09/19/2021 FOTO knee: 33, goal 52  SCREENING FOR RED FLAGS: 09/19/2021 Bowel or bladder incontinence: No Cauda equina syndrome: No  COGNITION: 09/19/2021 Overall cognitive status: Within functional limits for tasks assessed     SENSATION: 09/19/2021 unremarkable for changes  MUSCLE LENGTH: 09/19/2021 No specific tested today  POSTURE: 09/19/2021  Forward head posture, rounded shoulders, reduced lumbar lordosis  PALPATION: 09/19/2021 General tenderness noted in Lt middle rhomboids, lower trap, infraspinatus.  Bilateral upper trap tenderness and increased tightness.  Mild tenderness Rt QL, inferior lumbar paraspinals.    Tenderness to touch anterior/lateral joint line Lt knee.   CERVICAL/ LUMBAR ROM:   Active  AROM  09/19/2021 AROM 10/09/2021  Lumbar Flexion To patella To midshin  Lumbar  Extension 25 % in standing 50% in standing  Lumbar  Right lateral flexion To mid thigh c Rt lumbar pain To midthigh, painfree  Lumbar  Left lateral flexion To mid thigh c Rt lumbar pain To midthigh, painfree      Cervical flexion 30 46  Cervical extension 45 43  Cervical Lt rotation 45 59  Cervical Rt rotation 58 53   (Blank rows = not tested)  LOWER EXTREMITY ROM:      Right 09/19/2021 Left 09/19/2021  Hip flexion 95 Passive ROM in supine 95 Passive ROM in supine  Hip extension    Hip abduction    Hip adduction    Hip internal rotation    Hip external rotation 45 Passive ROM in supine 40 Passive ROM in supine  Knee flexion    Knee extension 0 AROM in seated LAQ 0 AROM in seated LAQ c pain noted  Ankle dorsiflexion    Ankle plantarflexion    Ankle inversion    Ankle eversion     (Blank rows = not tested)  LOWER EXTREMITY MMT:     09/19/2021:  General resisted static testing in supine hooklying : bilateral hip abd and add strong and painless MMT Right 09/19/2021 Left 09/19/2021  Left 09/19/2021  Hip flexion 5/5 5/5   Hip extension     Hip abduction     Hip adduction     Hip internal rotation     Hip external rotation     Knee flexion 5/5 5/5   Knee extension 5/5 4/5 4/5  Ankle dorsiflexion     Ankle plantarflexion     Ankle inversion     Ankle eversion      (Blank rows = not tested)  LUMBAR SPECIAL TESTS:  09/19/2021  Slump test Lt:     Rt:     Spurling's test neg bil.  FUNCTIONAL TESTS:   09/19/2021:    5x Sit to stand:  18.99 seconds    18 inch chair transfer:  able to perform without s UE  GAIT: 09/19/2021 Independent ambulation c mild deviation of reduced stance phase on Lt knee, mild forward trunk lean.  Household based distances observed in clinic.     TODAY'S TREATMENT  10/09/21  Seated hamstring stretch 3 x 30sec Standing heel to toe at counter x 10 Standing hip extension alternating x 10 ea, verbal cueing for upright posture Standing hip abduction alternating x 10 ea Standing open book stretch at wall 3s hold x 10 bil. Seated straight leg raise LLE 3s hold x 15 reps Seated shoulder external rotation and scap retraction with red band 3s hold x 15 Seated cervical retraction x 10 Seated lumbar side bending to elbow with 5s hold x 5 bil Standing bil. horizontal shoulder ab/adduction with 1lb weight passed between hands  and cueing for scap retraction x 6 Standing rows green X10 bil Standing shoulder extension green band x 10 bil. PT education on cervical traction indications and benefit of exercise classes/water exercise, pt verbalized understanding  10/06/21 Nu step L5 X 3 min UE/LE (stopped early due to left knee pain) Seated knee flexion AAROM stretch 5 sec X15 for left  Seated LAQ 2# X 15 alternating bilat Seated hamstring curl green X 15 each side Seated lumbar stretch pball roll outs 5sec X10 DKTC 5 second holds X15 with feet on pball Ab set with pball in hooklying isometric shoulder extensions 5 sec hold X15 Lumbar rotation stretches  5x10s seconds B  Piriformis stretch 3x20 seconds B Supine clams X20 green band 2 second holds  PPT X15 3 second holds Standing bilat shoulder flexion AAROM with 1# bar X10 Standing shoulder bilat abd AROM X10 Standing bilat shoulder ER with red X15   09/30/21 Seated hamstring stretch 30 sec X 2 bilat Seated LAQ X15 alternating bilat Seated marches X15 alternating bilat Seated lumbar stretch pball roll outs 10 sec X10 DKTC 5 second holds X15 with feet on pball Ab set with pball in hooklying isometric shoulder extensions 5 sec hold X15 Lumbar rotation stretches 10x5 seconds B  Piriformis stretch 3x20 seconds B Supine clams X20 green band 2 second holds  PPT 2x10 3 second holds Standing hip abduction X10 bilat Standing rows green X15 bilat  Nu step L5 X 5 min UE/LE   09/26/21  Nustep L4 x6 minutes BLEs only  SKTC 5x5 second holds B Lumbar rotation stretches 5x5 seconds B  HS stretch 2x30 seconds B  Piriformis stretch 2x30 seconds B Bridges 2x10 2 second holds  Supine clams 2x10 red TB 2 second holds  PPT 2x10 3 second holds PPT with march 2x10   Grade I-II PAs L5-T10   PATIENT EDUCATION:  Education details: HEP, POC Person educated: Patient Education method: Consulting civil engineer, Media planner, Verbal cues, and Handouts Education comprehension: verbalized understanding, returned demonstration, and verbal cues required   HOME EXERCISE PROGRAM: Access Code: [email or web address] URL: [email or web address] Date: 10/09/2021 Prepared by: Scot Jun  Exercises - Supine Lower Trunk Rotation  - 2-3 x daily - 7 x weekly - 1 sets - 3-5 reps - 30 hold - Supine Bridge  - 1-2 x daily - 7 x weekly - 2-3 sets - 10 reps - Supine Cervical Retraction with Towel  - 1-2 x daily - 7 x weekly - 2-3 sets - 10 reps - 5 hold - Seated Cervical Retraction  - 1-2 x daily - 7 x weekly - 3 sets - 10 reps - Seated Scapular Retraction  - 3-5 x daily - 7 x weekly - 2-3 sets - 10 reps - 5 hold -  Seated Quad Set (Mirrored)  - 2-3 x daily - 7 x weekly - 1 sets - 10 reps - 5 hold - Standing Shoulder Row with Anchored Resistance  - 1-2 x daily - 7 x weekly - 1-2 sets - 10 reps - Seated Straight Leg Heel Taps  - 1-2 x daily - 7 x weekly - 3 sets - 10 reps - Hooklying Clamshell with Resistance  - 1-2 x daily - 7 x weekly - 1-2 sets - 10 reps  ASSESSMENT:  CLINICAL IMPRESSION: Her GROC since eval notes improved condition of low back and worsened condition of neck and Lt knee, with her initially reporting no neck pain at eval and higher levels now. She has improved lumbar flexion, lumbar extension,  cervical flexion, and Lt cervical rotation ranges with no pain increase with lumbar or cervical motions. She is continuing to progress with trunk and lower extremity strengthening, and exercises were added to clinic for shoulder strengthening as she noted bilateral feeling shoulder weakness. HEP was updated to include progressive hip and LE strengthening and continued spinal mobility exercise. She is continuing to progress with skilled PT services to address mobility and strength impairments towards functional limitations.  OBJECTIVE IMPAIRMENTS Abnormal gait, decreased activity tolerance, decreased coordination, decreased endurance, decreased mobility, difficulty walking, decreased ROM, decreased strength, hypomobility, impaired perceived functional ability, impaired flexibility, impaired UE functional use, improper body mechanics, postural dysfunction, and pain.   ACTIVITY LIMITATIONS carrying, lifting, bending, sitting, standing, squatting, sleeping, stairs, transfers, bed mobility, reach over head, and locomotion level  PARTICIPATION LIMITATIONS: meal prep, cleaning, laundry, interpersonal relationship, shopping, and community activity  PERSONAL FACTORS  Multiple treatment areas, length of time since onset, surgical history, DDD lumbar, depression, DM, HTN, hypothyroidism, obesity, arthritis. lumbar  fusion and cervical fusion c4-   are also affecting patient's functional outcome.   REHAB POTENTIAL: Fair to good  CLINICAL DECISION MAKING: Evolving/moderate complexity  EVALUATION COMPLEXITY: Moderate   GOALS: Goals reviewed with patient? Yes  Short term PT Goals (target date for Short term goals are 3 weeks 10/10/2021) Patient will demonstrate independent use of home exercise program to maintain progress from in clinic treatments. Goal status: met 10/09/2021   Long term PT goals (target dates for all long term goals are 10 weeks  11/28/2021 )  1. Patient will demonstrate/report pain at worst less than or equal to 2/10 to facilitate minimal limitation in daily activity secondary to pain symptoms. Goal status: ongoing 10/09/21  2. Patient will demonstrate independent use of home exercise program to facilitate ability to maintain/progress functional gains from skilled physical therapy services. Goal status: ongoing 10/09/21  3. Patient will demonstrate FOTO knee outcome > or = 55 % to indicate reduced disability due to condition. Goal status: ongoing 10/09/21  4.  Patient will demonstrate Lt LE MMT 5/5 throughout to facilitate ability to perform usual standing, walking, stairs at PLOF s limitation due to symptoms. Goal status: ongoing 10/09/21  5.  Patient will demonstrate lumbar AROM > or = 75% WFL to facilitate upright standing/walking posture at PLOF.    Goal status: ongoing 10/09/21  6.  Patient will demonstrate cervical AROM improvements > or = 10 degrees on average to facilitate improved cervical mobility c daily activity.   Goal status: ongoing 10/09/21  7.  Patient will demonstrate/report ability to perform sitting > 1hr, standing and walking > 30 mins each for improvement in tolerance to static postures.  a.  Goal Status: ongoing 10/09/21  8. Patient will demonstrate/report ascending/descending stairs s UE assist c reciprocal gait pattern for house entry.  a.  Goal Status:  ongoing 10/09/21  PLAN: PT FREQUENCY: 1-2x/week  PT DURATION: 10 weeks  PLANNED INTERVENTIONS: Therapeutic exercises, Therapeutic activity, Neuro Muscular re-education, Balance training, Gait training, Patient/Family education, Joint mobilization, Stair training, DME instructions, Dry Needling, Electrical stimulation, Cryotherapy, Moist heat, Taping, Ultrasound, Ionotophoresis 41m/ml Dexamethasone, and Manual therapy.  All included unless contraindicated.  PLAN FOR NEXT SESSION: continue to progress general strength and mobility, progressive WB exercise and step negotiation as able  KJana Hakim SPT 10/09/2021, 4:31 PM  This entire session of physical therapy was performed under the direct supervision of PT signing evaluation /treatment. PT reviewed note and agrees.  MScot Jun PT, DPT, OCS, ATC 10/09/21  4:37 PM

## 2021-10-14 ENCOUNTER — Ambulatory Visit (INDEPENDENT_AMBULATORY_CARE_PROVIDER_SITE_OTHER): Payer: 59 | Admitting: Physical Therapy

## 2021-10-14 ENCOUNTER — Ambulatory Visit (INDEPENDENT_AMBULATORY_CARE_PROVIDER_SITE_OTHER): Payer: 59 | Admitting: Orthopaedic Surgery

## 2021-10-14 ENCOUNTER — Encounter: Payer: Self-pay | Admitting: Physical Therapy

## 2021-10-14 VITALS — BP 155/88 | HR 78

## 2021-10-14 DIAGNOSIS — M542 Cervicalgia: Secondary | ICD-10-CM | POA: Diagnosis not present

## 2021-10-14 DIAGNOSIS — M25562 Pain in left knee: Secondary | ICD-10-CM | POA: Diagnosis not present

## 2021-10-14 DIAGNOSIS — G8929 Other chronic pain: Secondary | ICD-10-CM

## 2021-10-14 DIAGNOSIS — M5459 Other low back pain: Secondary | ICD-10-CM

## 2021-10-14 DIAGNOSIS — R293 Abnormal posture: Secondary | ICD-10-CM

## 2021-10-14 DIAGNOSIS — Q761 Klippel-Feil syndrome: Secondary | ICD-10-CM

## 2021-10-14 DIAGNOSIS — M6281 Muscle weakness (generalized): Secondary | ICD-10-CM

## 2021-10-14 DIAGNOSIS — R262 Difficulty in walking, not elsewhere classified: Secondary | ICD-10-CM

## 2021-10-14 NOTE — Therapy (Addendum)
OUTPATIENT PHYSICAL THERAPY TREATMENT   Patient Name: [name] [name] MRN: [id number] DOB:Nov 23, 1965, 56 y.o., female Today's Date: 10/14/2021   End of Session:  PT End of Session - 10/14/21 1608     Visit Number 6    Number of Visits 20    Date for PT Re-Evaluation 11/28/21    Authorization Type [employer] no copay    Progress Note Due on Visit 15    PT Start Time 1555    PT Stop Time 1640    PT Time Calculation (min) 45 min    Activity Tolerance Patient tolerated treatment well;No increased pain    Behavior During Therapy WFL for tasks assessed/performed             Past Medical History:  Diagnosis Date   Acute bronchitis due to COVID-19 virus 09/06/2020   Anxiety 01/12/2013   Arthralgia 07/08/2016   Asthma    Atrial fibrillation ([place]) 12/06/2018   B12 deficiency 01/31/2018   Chicken pox    Chronic insomnia 11/24/2016   Clostridium difficile infection 04/02/2014, 02/06/2014   Complex tear of medial meniscus of left knee as current injury 12/01/2018   Complex tear of medial meniscus of left knee as current injury 12/01/2018   DDD (degenerative disc disease), lumbar 09/09/2018   Depression    Diabetes mellitus without complication ([place]) 24/06/8097   Dyslipidemia    Elevated hemoglobin A1c 07/08/2016   Estrogen deficiency 12/06/2018   Foraminal stenosis of lumbar region 09/09/2018   Hair loss 07/20/2017   History of lumbar fusion 09/09/2018   Hormone replacement therapy (HRT) 07/20/2017   HTN (hypertension)    Hypertension 07/08/2016   Hypokalemia 12/06/2018   Hypothyroidism    Left medial tibial plateau fracture 11/10/2018   Lumbar radiculopathy 09/09/2018   Referred to NS 09/09/2018   Migraine    Mild memory disturbance 11/13/2013   Morbid obesity ([place]) 07/08/2016   Multiple allergies    Obesity    Rash associated with COVID-19 10/02/2020   Restless legs syndrome (RLS) 01/12/2013   Follows with Dr. Jannifer Franklin who prescribes Requip and gabapentin   RLS (restless legs syndrome)     Vitamin D deficiency 12/06/2018   Wears glasses 07/08/2016   Past Surgical History:  Procedure Laterality Date   BREAST BIOPSY  8338,2505   x 2 benign lesion   CERVICAL DISCECTOMY  2003   CERVICAL FUSION  2010   x2   Hand fracture surgery     LUMBAR FUSION  2016   VAGINAL HYSTERECTOMY  2009   Patient Active Problem List   Diagnosis Date Noted   Primary osteoarthritis of left knee 08/05/2021   Cervical fusion syndrome 07/09/2021   Chronic pain syndrome 07/09/2021   Myofascial pain syndrome of thoracic spine 05/29/2021   Cervical radiculopathy 05/29/2021   Chicken pox 04/21/2021   Asthma 04/21/2021   Stress fracture of rib 04/15/2021   Scapular dysfunction 04/02/2021   Effusion, left shoulder 03/26/2021   AC joint derangement 03/26/2021   BMI 45.0-49.9, adult ([place]) 03/26/2021   Neuropathy 02/18/2021   Multiple allergies 10/02/2020   Acquired thrombophilia (HCC)-eliquis 12/20/2019   Hypokalemia 12/06/2018   Atrial fibrillation ([place]) 12/06/2018   Vitamin D deficiency 12/06/2018   Estrogen deficiency 12/06/2018   Clostridium difficile infection 12/01/2018   DDD (degenerative disc disease), lumbar 09/09/2018   Foraminal stenosis of lumbar region 09/09/2018   History of lumbar fusion 09/09/2018   Lumbar radiculopathy 09/09/2018   B12 deficiency 01/31/2018   Hormone replacement therapy (HRT)  07/20/2017   Chronic insomnia 11/24/2016   Elevated hemoglobin A1c 07/08/2016   Hypertension 07/08/2016   Arthralgia 07/08/2016   Morbid obesity ([place]) 07/08/2016   Hypothyroidism    Dyslipidemia    Depression    Migraine 11/13/2013   Restless legs syndrome (RLS) 01/12/2013   Anxiety 01/12/2013    PCP: Howard Pouch DO  REFERRING PROVIDER: Marybelle Killings, MD   REFERRING DIAG: [phone] (ICD-10-CM) - Primary osteoarthritis of left knee Z98.1 (ICD-10-CM) - History of lumbar fusion M54.2 (ICD-10-CM) - Neck pain  Rationale for Evaluation and Treatment Rehabilitation  THERAPY DIAG:   Other low back pain  Cervicalgia  Chronic pain of left knee  Abnormal posture  Muscle weakness (generalized)  Difficulty in walking, not elsewhere classified  ONSET DATE: 03/02/2021  SUBJECTIVE:                                                                                                                                                                                           SUBJECTIVE STATEMENT: She states she is feeling better with neck and back pain today but her knee is having pain. She says she saw MD today about her neck and that he wants PT to try traction in her session  PERTINENT HISTORY:  DDD lumbar, depression, DM, HTN, hypothyroidism, obesity, arthritis. lumbar fusion and cervical fusion c4-7  PAIN:  NPRS scale: 6/10 pain in her knee mostly Pain location: Back, neck/shoulders, Lt knee Pain description: back: consistent, dull, intense   neck: shooting   Lt knee: giving way/intense - sharp Aggravating factors:  carrying items, housework,  standing/walking prolonged, stairs, squatting. Bed mobility turning, sitting prolonged  Relieving factors: massage at home, heat pad, cervical traction over door.    PRECAUTIONS: history of lumbar and cervical fusions  WEIGHT BEARING RESTRICTIONS No  FALLS:  Has patient fallen in last 6 months? No  LIVING ENVIRONMENT: Lives with: lives with their family Lives in: House/apartment Stairs: 4 stairs to enter, has rails but can use only one  OCCUPATION: Dis[employer] work with sitting required.   PLOF: Independent, housework, yardwork  PATIENT GOALS   "get pain under control"      OBJECTIVE:   DIAGNOSTIC FINDINGS:  09/19/2021:  Multiple imaging over time, see in Epic chart  PATIENT SURVEYS:  09/19/2021 FOTO knee: 33, goal 45  SCREENING FOR RED FLAGS: 09/19/2021 Bowel or bladder incontinence: No Cauda equina syndrome: No  COGNITION: 09/19/2021 Overall cognitive status: Within functional limits for tasks  assessed     SENSATION: 09/19/2021 unremarkable for changes  MUSCLE LENGTH: 09/19/2021 No specific tested today  POSTURE: 09/19/2021  Forward head posture, rounded shoulders, reduced  lumbar lordosis  PALPATION: 09/19/2021 General tenderness noted in Lt middle rhomboids, lower trap, infraspinatus.  Bilateral upper trap tenderness and increased tightness.  Mild tenderness Rt QL, inferior lumbar paraspinals.    Tenderness to touch anterior/lateral joint line Lt knee.   CERVICAL/ LUMBAR ROM:   Active  AROM  09/19/2021 AROM 10/09/2021  Lumbar Flexion To patella To midshin  Lumbar  Extension 25 % in standing 50% in standing  Lumbar  Right lateral flexion To mid thigh c Rt lumbar pain To midthigh, painfree  Lumbar  Left lateral flexion To mid thigh c Rt lumbar pain To midthigh, painfree      Cervical flexion 30 46  Cervical extension 45 43  Cervical Lt rotation 45 59  Cervical Rt rotation 58 53   (Blank rows = not tested)  LOWER EXTREMITY ROM:      Right 09/19/2021 Left 09/19/2021  Hip flexion 95 Passive ROM in supine 95 Passive ROM in supine  Hip extension    Hip abduction    Hip adduction    Hip internal rotation    Hip external rotation 45 Passive ROM in supine 40 Passive ROM in supine  Knee flexion    Knee extension 0 AROM in seated LAQ 0 AROM in seated LAQ c pain noted  Ankle dorsiflexion    Ankle plantarflexion    Ankle inversion    Ankle eversion     (Blank rows = not tested)  LOWER EXTREMITY MMT:     09/19/2021:  General resisted static testing in supine hooklying : bilateral hip abd and add strong and painless MMT Right 09/19/2021 Left 09/19/2021 Left 09/19/2021  Hip flexion 5/5 5/5   Hip extension     Hip abduction     Hip adduction     Hip internal rotation     Hip external rotation     Knee flexion 5/5 5/5   Knee extension 5/5 4/5 4/5  Ankle dorsiflexion     Ankle plantarflexion     Ankle inversion     Ankle eversion      (Blank rows = not  tested)  LUMBAR SPECIAL TESTS:  09/19/2021  Slump test Lt:     Rt:     Spurling's test neg bil.  FUNCTIONAL TESTS:   09/19/2021:    5x Sit to stand:  18.99 seconds    18 inch chair transfer:  able to perform without s UE  GAIT: 09/19/2021 Independent ambulation c mild deviation of reduced stance phase on Lt knee, mild forward trunk lean.  Household based distances observed in clinic.     TODAY'S TREATMENT  10/14/21 -Sci fit bike for legs 5 min, then for arms X 5 min (retro) L1 -Seated SLR 2X10 bilat -Seated rows with green X 15 -Seated shoulder extensions with green X 15 -Seated thoracic extension X 10 bilat -Seated cervical retractions X 10 bilat -Standing hip extension X 10 bilat -Standing hip abd X 10 bilat  -mechanical cervical traction 10# limit per MD for 10 min static   10/09/21  Seated hamstring stretch 3 x 30sec Standing heel to toe at counter x 10 Standing hip extension alternating x 10 ea, verbal cueing for upright posture Standing hip abduction alternating x 10 ea Standing open book stretch at wall 3s hold x 10 bil. Seated straight leg raise LLE 3s hold x 15 reps Seated shoulder external rotation and scap retraction with red band 3s hold x 15 Seated cervical retraction x 10 Seated lumbar side bending to  elbow with 5s hold x 5 bil Standing bil. horizontal shoulder ab/adduction with 1lb weight passed between hands and cueing for scap retraction x 6 Standing rows green X10 bil Standing shoulder extension green band x 10 bil. PT education on cervical traction indications and benefit of exercise classes/water exercise, pt verbalized understanding    PATIENT EDUCATION:  Education details: HEP, POC Person educated: Patient Education method: Explanation, Demonstration, Verbal cues, and Handouts Education comprehension: verbalized understanding, returned demonstration, and verbal cues required   HOME EXERCISE PROGRAM: Access Code: [email or web address] URL:  [email or web address] Date: 10/09/2021 Prepared by: Scot Jun  Exercises - Supine Lower Trunk Rotation  - 2-3 x daily - 7 x weekly - 1 sets - 3-5 reps - 30 hold - Supine Bridge  - 1-2 x daily - 7 x weekly - 2-3 sets - 10 reps - Supine Cervical Retraction with Towel  - 1-2 x daily - 7 x weekly - 2-3 sets - 10 reps - 5 hold - Seated Cervical Retraction  - 1-2 x daily - 7 x weekly - 3 sets - 10 reps - Seated Scapular Retraction  - 3-5 x daily - 7 x weekly - 2-3 sets - 10 reps - 5 hold - Seated Quad Set (Mirrored)  - 2-3 x daily - 7 x weekly - 1 sets - 10 reps - 5 hold - Standing Shoulder Row with Anchored Resistance  - 1-2 x daily - 7 x weekly - 1-2 sets - 10 reps - Seated Straight Leg Heel Taps  - 1-2 x daily - 7 x weekly - 3 sets - 10 reps - Hooklying Clamshell with Resistance  - 1-2 x daily - 7 x weekly - 1-2 sets - 10 reps  ASSESSMENT:  CLINICAL IMPRESSION: She was having less overall pain today and had good tolerance to exercises. She reports MD wants PT to try cervical traction and in his note he lists 10# limit. This was performed today for 10 minutes and her initial response was that she did not find it to be helpful and that her Rt arm went numb at the end.   OBJECTIVE IMPAIRMENTS Abnormal gait, decreased activity tolerance, decreased coordination, decreased endurance, decreased mobility, difficulty walking, decreased ROM, decreased strength, hypomobility, impaired perceived functional ability, impaired flexibility, impaired UE functional use, improper body mechanics, postural dysfunction, and pain.   ACTIVITY LIMITATIONS carrying, lifting, bending, sitting, standing, squatting, sleeping, stairs, transfers, bed mobility, reach over head, and locomotion level  PARTICIPATION LIMITATIONS: meal prep, cleaning, laundry, interpersonal relationship, shopping, and community activity  PERSONAL FACTORS  Multiple treatment areas, length of time since onset, surgical history,  DDD lumbar, depression, DM, HTN, hypothyroidism, obesity, arthritis. lumbar fusion and cervical fusion c4-   are also affecting patient's functional outcome.   REHAB POTENTIAL: Fair to good  CLINICAL DECISION MAKING: Evolving/moderate complexity  EVALUATION COMPLEXITY: Moderate   GOALS: Goals reviewed with patient? Yes  Short term PT Goals (target date for Short term goals are 3 weeks 10/10/2021) Patient will demonstrate independent use of home exercise program to maintain progress from in clinic treatments. Goal status: met 10/09/2021   Long term PT goals (target dates for all long term goals are 10 weeks  11/28/2021 )  1. Patient will demonstrate/report pain at worst less than or equal to 2/10 to facilitate minimal limitation in daily activity secondary to pain symptoms. Goal status: ongoing 10/09/21  2. Patient will demonstrate independent use of home exercise program to facilitate ability to maintain/progress functional gains from skilled physical  therapy services. Goal status: ongoing 10/09/21  3. Patient will demonstrate FOTO knee outcome > or = 55 % to indicate reduced disability due to condition. Goal status: ongoing 10/09/21  4.  Patient will demonstrate Lt LE MMT 5/5 throughout to facilitate ability to perform usual standing, walking, stairs at PLOF s limitation due to symptoms. Goal status: ongoing 10/09/21  5.  Patient will demonstrate lumbar AROM > or = 75% WFL to facilitate upright standing/walking posture at PLOF.    Goal status: ongoing 10/09/21  6.  Patient will demonstrate cervical AROM improvements > or = 10 degrees on average to facilitate improved cervical mobility c daily activity.   Goal status: ongoing 10/09/21  7.  Patient will demonstrate/report ability to perform sitting > 1hr, standing and walking > 30 mins each for improvement in tolerance to static postures.  a.  Goal Status: ongoing 10/09/21  8. Patient will demonstrate/report ascending/descending stairs s  UE assist c reciprocal gait pattern for house entry.  a.  Goal Status: ongoing 10/09/21  PLAN: PT FREQUENCY: 1-2x/week  PT DURATION: 10 weeks  PLANNED INTERVENTIONS: Therapeutic exercises, Therapeutic activity, Neuro Muscular re-education, Balance training, Gait training, Patient/Family education, Joint mobilization, Stair training, DME instructions, Dry Needling, Electrical stimulation, Cryotherapy, Moist heat, Taping, Ultrasound, Ionotophoresis 22m/ml Dexamethasone, and Manual therapy.  All included unless contraindicated.  PLAN FOR NEXT SESSION: continue to progress general strength and mobility, progressive WB exercise and step negotiation as able  BElsie Ra PT, DPT 10/14/21 4:09 PM

## 2021-10-14 NOTE — Progress Notes (Signed)
Office Visit Note   [name]           Date of Birth: March 30, 1965           MRN: [id number] Visit Date: 10/14/2021              Requested by: [name], DO [address] [place],  [place] [address] PCP: [name], DO   Assessment & Plan: Visit Diagnoses:  1. Cervical fusion syndrome     Plan: Patient's fused from C4-C7 with disc protrusion C7-T1.  She can continue therapy which is helping we will repeat instructions on her arm cervical traction she likely has the cervical harness were flipped 180 degrees.  She can follow-up with me if she continues to have problems.  Reviewed cervical MRI scan and discussed potential exposure problems with the surgery and possible requirement for clavicular osteotomy.  Follow-Up Instructions: No follow-ups on file.   Orders:  No orders of the defined types were placed in this encounter.  No orders of the defined types were placed in this encounter.     Procedures: No procedures performed   Clinical Data: No additional findings.   Subjective: No chief complaint on file.   HPI 56 year old female had surgery by Dr. Ronnald Ramp is fused from C4-5 with plate previous fusion at C5-6 C6-7 and plate was later removed.  She has 2 mm anterolisthesis with moderate stenosis at C7-T1 with disc osteophyte complex centrally.  She states therapy has helped she is more than 50% better.  She states her low back pain is significantly better as well.  She is using Tylenol without relief.  She tried traction set up states she used it once or twice but then is having trouble can put it on by herself.  Review of Systems all the systems update unchanged.   Objective: Vital Signs: BP (!) 155/88   Pulse 78   Physical Exam Constitutional:      Appearance: She is well-developed.  HENT:     Head: Normocephalic.     Right Ear: External ear normal.     Left Ear: External ear normal. There is no impacted cerumen.  Eyes:     Pupils: Pupils  are equal, round, and reactive to light.  Neck:     Thyroid: No thyromegaly.     Trachea: No tracheal deviation.  Cardiovascular:     Rate and Rhythm: Normal rate.  Pulmonary:     Effort: Pulmonary effort is normal.  Abdominal:     Palpations: Abdomen is soft.  Musculoskeletal:     Cervical back: No rigidity.  Skin:    General: Skin is warm and dry.  Neurological:     Mental Status: She is alert and oriented to person, place, and time.  Psychiatric:        Behavior: Behavior normal.     Ortho Exam patient has some improvement was pain with distraction some increase of cervical compression mild brachial plexus tenderness.  Sensation hands are intact.  No upper extremity atrophy.  Specialty Comments:  No specialty comments available.  Imaging: No results found.   PMFS History: Patient Active Problem List   Diagnosis Date Noted   Primary osteoarthritis of left knee 08/05/2021   Cervical fusion syndrome 07/09/2021   Chronic pain syndrome 07/09/2021   Myofascial pain syndrome of thoracic spine 05/29/2021   Cervical radiculopathy 05/29/2021   Chicken pox 04/21/2021   Asthma 04/21/2021   Stress fracture of rib 04/15/2021  Scapular dysfunction 04/02/2021   Effusion, left shoulder 03/26/2021   AC joint derangement 03/26/2021   BMI 45.0-49.9, adult ([place]) 03/26/2021   Neuropathy 02/18/2021   Multiple allergies 10/02/2020   Acquired thrombophilia (HCC)-eliquis 12/20/2019   Hypokalemia 12/06/2018   Atrial fibrillation ([place]) 12/06/2018   Vitamin D deficiency 12/06/2018   Estrogen deficiency 12/06/2018   Clostridium difficile infection 12/01/2018   DDD (degenerative disc disease), lumbar 09/09/2018   Foraminal stenosis of lumbar region 09/09/2018   History of lumbar fusion 09/09/2018   Lumbar radiculopathy 09/09/2018   B12 deficiency 01/31/2018   Hormone replacement therapy (HRT) 07/20/2017   Chronic insomnia 11/24/2016   Elevated hemoglobin A1c 07/08/2016    Hypertension 07/08/2016   Arthralgia 07/08/2016   Morbid obesity ([place]) 07/08/2016   Hypothyroidism    Dyslipidemia    Depression    Migraine 11/13/2013   Restless legs syndrome (RLS) 01/12/2013   Anxiety 01/12/2013   Past Medical History:  Diagnosis Date   Acute bronchitis due to COVID-19 virus 09/06/2020   Anxiety 01/12/2013   Arthralgia 07/08/2016   Asthma    Atrial fibrillation ([place]) 12/06/2018   B12 deficiency 01/31/2018   Chicken pox    Chronic insomnia 11/24/2016   Clostridium difficile infection 04/02/2014, 02/06/2014   Complex tear of medial meniscus of left knee as current injury 12/01/2018   Complex tear of medial meniscus of left knee as current injury 12/01/2018   DDD (degenerative disc disease), lumbar 09/09/2018   Depression    Diabetes mellitus without complication ([place]) 35/06/7320   Dyslipidemia    Elevated hemoglobin A1c 07/08/2016   Estrogen deficiency 12/06/2018   Foraminal stenosis of lumbar region 09/09/2018   Hair loss 07/20/2017   History of lumbar fusion 09/09/2018   Hormone replacement therapy (HRT) 07/20/2017   HTN (hypertension)    Hypertension 07/08/2016   Hypokalemia 12/06/2018   Hypothyroidism    Left medial tibial plateau fracture 11/10/2018   Lumbar radiculopathy 09/09/2018   Referred to NS 09/09/2018   Migraine    Mild memory disturbance 11/13/2013   Morbid obesity ([place]) 07/08/2016   Multiple allergies    Obesity    Rash associated with COVID-19 10/02/2020   Restless legs syndrome (RLS) 01/12/2013   Follows with Dr. Jannifer Franklin who prescribes Requip and gabapentin   RLS (restless legs syndrome)    Vitamin D deficiency 12/06/2018   Wears glasses 07/08/2016    Family History  Problem Relation Age of Onset   Hypertension Father    Diabetes Father    Heart disease Father    Hearing loss Father    Diabetes Brother    Liver cancer Mother    COPD Mother    Mental illness Mother    Diabetes Mother    Hearing loss Mother    Heart disease Mother     Arthritis/Rheumatoid Mother     Past Surgical History:  Procedure Laterality Date   BREAST BIOPSY  0254,2706   x 2 benign lesion   CERVICAL DISCECTOMY  2003   CERVICAL FUSION  2010   x2   Hand fracture surgery     LUMBAR FUSION  2016   VAGINAL HYSTERECTOMY  2009   Social History   Occupational History   Occupation: OWNER    Employer: [employer]  Tobacco Use   Smoking status: Never    Passive exposure: Never   Smokeless tobacco: Never  Vaping Use   Vaping Use: Never used  Substance and Sexual Activity   Alcohol use: No  Alcohol/week: 0.0 standard drinks of alcohol   Drug use: No   Sexual activity: Yes    Partners: Male    Birth control/protection: None

## 2021-10-16 ENCOUNTER — Ambulatory Visit (INDEPENDENT_AMBULATORY_CARE_PROVIDER_SITE_OTHER): Payer: 59 | Admitting: Rehabilitative and Restorative Service Providers"

## 2021-10-16 ENCOUNTER — Encounter: Payer: Self-pay | Admitting: Rehabilitative and Restorative Service Providers"

## 2021-10-16 DIAGNOSIS — M5459 Other low back pain: Secondary | ICD-10-CM

## 2021-10-16 DIAGNOSIS — M542 Cervicalgia: Secondary | ICD-10-CM | POA: Diagnosis not present

## 2021-10-16 DIAGNOSIS — R293 Abnormal posture: Secondary | ICD-10-CM | POA: Diagnosis not present

## 2021-10-16 DIAGNOSIS — M25562 Pain in left knee: Secondary | ICD-10-CM | POA: Diagnosis not present

## 2021-10-16 DIAGNOSIS — G8929 Other chronic pain: Secondary | ICD-10-CM

## 2021-10-16 DIAGNOSIS — R262 Difficulty in walking, not elsewhere classified: Secondary | ICD-10-CM

## 2021-10-16 DIAGNOSIS — M6281 Muscle weakness (generalized): Secondary | ICD-10-CM

## 2021-10-16 NOTE — Therapy (Signed)
OUTPATIENT PHYSICAL THERAPY TREATMENT   Patient Name: [name] [name] MRN: [id number] DOB:[birth year], 56 y.o., female Today's Date: 10/16/2021   End of Session:  PT End of Session - 10/16/21 1516     Visit Number 7    Number of Visits 20    Date for PT Re-Evaluation 11/28/21    Authorization Type [employer] no copay    Progress Note Due on Visit 15    PT Start Time 1516    PT Stop Time 1558    PT Time Calculation (min) 42 min    Activity Tolerance Patient tolerated treatment well;No increased pain    Behavior During Therapy WFL for tasks assessed/performed              Past Medical History:  Diagnosis Date   Acute bronchitis due to COVID-19 virus 09/06/2020   Anxiety 01/12/2013   Arthralgia 07/08/2016   Asthma    Atrial fibrillation ([place]) 12/06/2018   B12 deficiency 01/31/2018   Chicken pox    Chronic insomnia 11/24/2016   Clostridium difficile infection 04/02/2014, 02/06/2014   Complex tear of medial meniscus of left knee as current injury 12/01/2018   Complex tear of medial meniscus of left knee as current injury 12/01/2018   DDD (degenerative disc disease), lumbar 09/09/2018   Depression    Diabetes mellitus without complication ([place]) 65/08/8467   Dyslipidemia    Elevated hemoglobin A1c 07/08/2016   Estrogen deficiency 12/06/2018   Foraminal stenosis of lumbar region 09/09/2018   Hair loss 07/20/2017   History of lumbar fusion 09/09/2018   Hormone replacement therapy (HRT) 07/20/2017   HTN (hypertension)    Hypertension 07/08/2016   Hypokalemia 12/06/2018   Hypothyroidism    Left medial tibial plateau fracture 11/10/2018   Lumbar radiculopathy 09/09/2018   Referred to NS 09/09/2018   Migraine    Mild memory disturbance 11/13/2013   Morbid obesity ([place]) 07/08/2016   Multiple allergies    Obesity    Rash associated with COVID-19 10/02/2020   Restless legs syndrome (RLS) 01/12/2013   Follows with Dr. Jannifer Franklin who prescribes Requip and gabapentin   RLS (restless legs syndrome)     Vitamin D deficiency 12/06/2018   Wears glasses 07/08/2016   Past Surgical History:  Procedure Laterality Date   BREAST BIOPSY  [id number]   x 2 benign lesion   CERVICAL DISCECTOMY  2003   CERVICAL FUSION  2010   x2   Hand fracture surgery     LUMBAR FUSION  2016   VAGINAL HYSTERECTOMY  2009   Patient Active Problem List   Diagnosis Date Noted   Primary osteoarthritis of left knee 08/05/2021   Cervical fusion syndrome 07/09/2021   Chronic pain syndrome 07/09/2021   Myofascial pain syndrome of thoracic spine 05/29/2021   Cervical radiculopathy 05/29/2021   Chicken pox 04/21/2021   Asthma 04/21/2021   Stress fracture of rib 04/15/2021   Scapular dysfunction 04/02/2021   Effusion, left shoulder 03/26/2021   AC joint derangement 03/26/2021   BMI 45.0-49.9, adult ([place]) 03/26/2021   Neuropathy 02/18/2021   Multiple allergies 10/02/2020   Acquired thrombophilia (HCC)-eliquis 12/20/2019   Hypokalemia 12/06/2018   Atrial fibrillation ([place]) 12/06/2018   Vitamin D deficiency 12/06/2018   Estrogen deficiency 12/06/2018   Clostridium difficile infection 12/01/2018   DDD (degenerative disc disease), lumbar 09/09/2018   Foraminal stenosis of lumbar region 09/09/2018   History of lumbar fusion 09/09/2018   Lumbar radiculopathy 09/09/2018   B12 deficiency 01/31/2018   Hormone replacement therapy (  HRT) 07/20/2017   Chronic insomnia 11/24/2016   Elevated hemoglobin A1c 07/08/2016   Hypertension 07/08/2016   Arthralgia 07/08/2016   Morbid obesity ([place]) 07/08/2016   Hypothyroidism    Dyslipidemia    Depression    Migraine 11/13/2013   Restless legs syndrome (RLS) 01/12/2013   Anxiety 01/12/2013    PCP: Howard Pouch DO  REFERRING PROVIDER: Marybelle Killings, MD   REFERRING DIAG: [phone] (ICD-10-CM) - Primary osteoarthritis of left knee Z98.1 (ICD-10-CM) - History of lumbar fusion M54.2 (ICD-10-CM) - Neck pain  Rationale for Evaluation and Treatment Rehabilitation  THERAPY DIAG:   Other low back pain  Cervicalgia  Chronic pain of left knee  Abnormal posture  Muscle weakness (generalized)  Difficulty in walking, not elsewhere classified  ONSET DATE: 03/02/2021  SUBJECTIVE:                                                                                                                                                                                           SUBJECTIVE STATEMENT: She presents with no pain in her back and neck, and pain in her knee that affects everything she does. She felt pain since last session up to yesterday from the traction. She has lost 9 lbs this week. She has seen a nutritionist this past week, and intends to start a pilates or aerobics class.  PERTINENT HISTORY:  DDD lumbar, depression, DM, HTN, hypothyroidism, obesity, arthritis. lumbar fusion and cervical fusion c4-7  PAIN:  NPRS scale: 8/10 pain in her knee, 0/10 back and neck Pain location: Back, neck/shoulders, Lt knee Pain description: back: consistent, dull, intense   neck: shooting   Lt knee: giving way/intense - sharp Aggravating factors:  carrying items, housework,  standing/walking prolonged, stairs, squatting. Bed mobility turning, sitting prolonged  Relieving factors: massage at home, heat pad, cervical traction over door.    PRECAUTIONS: history of lumbar and cervical fusions  WEIGHT BEARING RESTRICTIONS No  FALLS:  Has patient fallen in last 6 months? No  LIVING ENVIRONMENT: Lives with: lives with their family Lives in: House/apartment Stairs: 4 stairs to enter, has rails but can use only one  OCCUPATION: [employer] work with sitting required.   PLOF: Independent, housework, yardwork  PATIENT GOALS   "get pain under control"      OBJECTIVE:   DIAGNOSTIC FINDINGS:  09/19/2021:  Multiple imaging over time, see in Epic chart  PATIENT SURVEYS:  09/19/2021 FOTO knee: 33, goal 44  SCREENING FOR RED FLAGS: 09/19/2021 Bowel or bladder incontinence: No Cauda  equina syndrome: No  COGNITION: 09/19/2021 Overall cognitive status: Within functional limits for tasks assessed     SENSATION:  09/19/2021 unremarkable for changes  MUSCLE LENGTH: 09/19/2021 No specific tested today  POSTURE: 09/19/2021  Forward head posture, rounded shoulders, reduced lumbar lordosis  PALPATION:   10/16/2021 Tenderness over spinous processes in upper thoracic spine, none in lower cervical spine or paraspinals  09/19/2021 General tenderness noted in Lt middle rhomboids, lower trap, infraspinatus.  Bilateral upper trap tenderness and increased tightness.  Mild tenderness Rt QL, inferior lumbar paraspinals.    Tenderness to touch anterior/lateral joint line Lt knee.   CERVICAL/ LUMBAR ROM:   Active  AROM  09/19/2021 AROM 10/09/2021  Lumbar Flexion To patella To midshin  Lumbar  Extension 25 % in standing 50% in standing  Lumbar  Right lateral flexion To mid thigh c Rt lumbar pain To midthigh, painfree  Lumbar  Left lateral flexion To mid thigh c Rt lumbar pain To midthigh, painfree      Cervical flexion 30 46  Cervical extension 45 43  Cervical Lt rotation 45 59  Cervical Rt rotation 58 53   (Blank rows = not tested)  LOWER EXTREMITY ROM:      Right 09/19/2021 Left 09/19/2021  Hip flexion 95 Passive ROM in supine 95 Passive ROM in supine  Hip extension    Hip abduction    Hip adduction    Hip internal rotation    Hip external rotation 45 Passive ROM in supine 40 Passive ROM in supine  Knee flexion    Knee extension 0 AROM in seated LAQ 0 AROM in seated LAQ c pain noted  Ankle dorsiflexion    Ankle plantarflexion    Ankle inversion    Ankle eversion     (Blank rows = not tested)  LOWER EXTREMITY MMT:     09/19/2021:  General resisted static testing in supine hooklying : bilateral hip abd and add strong and painless MMT Right 09/19/2021 Left 09/19/2021 Left 09/19/2021  Hip flexion 5/5 5/5   Hip extension     Hip abduction     Hip adduction     Hip  internal rotation     Hip external rotation     Knee flexion 5/5 5/5   Knee extension 5/5 4/5 4/5  Ankle dorsiflexion     Ankle plantarflexion     Ankle inversion     Ankle eversion      (Blank rows = not tested)  LUMBAR SPECIAL TESTS:  09/19/2021  Slump test Lt:     Rt:     Spurling's test neg bil.  FUNCTIONAL TESTS:   09/19/2021:    5x Sit to stand:  18.99 seconds    18 inch chair transfer:  able to perform without s UE  GAIT: 09/19/2021 Independent ambulation c mild deviation of reduced stance phase on Lt knee, mild forward trunk lean.  Household based distances observed in clinic.     TODAY'S TREATMENT  10/16/21 TherEx: -Standing heel to toes with fingertip UE support x 15 -Standing green band scaption with looking at hands x 10 unilaterally, x 10 bil. -Seated cervical retraction x 10 with 3s hold -Seated shoulder external rotation and scap retraction with green band 3s hold x 10 -Seated thoracic extension x 20 over back of chair -Seated bil. shoulder flexion 1 lb bar x 10, standing x 10 -Standing open book thoracic rotation x 8 bil. -Modified tandem stance x 30s bil, increased pain with RLE posterior -PT printed and reviewed updated HEP and discussed indications/contraindications for cervical traction, pt verbalized understanding  Manual -Seated PA grade 3 mobs  T3-T6  cPA  10/14/21 -Sci fit bike for legs 5 min, then for arms X 5 min (retro) L1 -Seated SLR 2X10 bilat -Seated rows with green X 15 -Seated shoulder extensions with green X 15 -Seated thoracic extension X 10 bilat -Seated cervical retractions X 10 bilat -Standing hip extension X 10 bilat -Standing hip abd X 10 bilat  -mechanical cervical traction 10# limit per MD for 10 min static   10/09/21  Seated hamstring stretch 3 x 30sec Standing heel to toe at counter x 10 Standing hip extension alternating x 10 ea, verbal cueing for upright posture Standing hip abduction alternating x 10 ea Standing open  book stretch at wall 3s hold x 10 bil. Seated straight leg raise LLE 3s hold x 15 reps Seated shoulder external rotation and scap retraction with red band 3s hold x 15 Seated cervical retraction x 10 Seated lumbar side bending to elbow with 5s hold x 5 bil Standing bil. horizontal shoulder ab/adduction with 1lb weight passed between hands and cueing for scap retraction x 6 Standing rows green X10 bil Standing shoulder extension green band x 10 bil. PT education on cervical traction indications and benefit of exercise classes/water exercise, pt verbalized understanding    PATIENT EDUCATION:  Education details: HEP, POC Person educated: Patient Education method: Explanation, Demonstration, Verbal cues, and Handouts Education comprehension: verbalized understanding, returned demonstration, and verbal cues required   HOME EXERCISE PROGRAM: Access Code: [email or web address] URL: [email or web address] Date: 10/16/2021 Prepared by: Scot Jun  Exercises - Supine Lower Trunk Rotation  - 2-3 x daily - 7 x weekly - 1 sets - 3-5 reps - 30 hold - Supine Bridge  - 1-2 x daily - 7 x weekly - 2-3 sets - 10 reps - Supine Cervical Retraction with Towel  - 1-2 x daily - 7 x weekly - 2-3 sets - 10 reps - 5 hold - Seated Cervical Retraction  - 1-2 x daily - 7 x weekly - 3 sets - 10 reps - Standing Shoulder Row with Anchored Resistance  - 1-2 x daily - 7 x weekly - 1-2 sets - 10 reps - Seated Straight Leg Heel Taps  - 1-2 x daily - 7 x weekly - 3 sets - 10 reps - Hooklying Clamshell with Resistance  - 1-2 x daily - 7 x weekly - 1-2 sets - 10 reps - Shoulder External Rotation and Scapular Retraction with Resistance  - 2-3 x daily - 7 x weekly - 1-2 sets - 10 reps - Seated Thoracic Lumbar Extension  - 1-2 x daily - 7 x weekly - 1 sets - 10 reps  ASSESSMENT:  CLINICAL IMPRESSION: She noted pain in upper thoracic spinal region that felt looser following seated cervical and thoracic exercises and  some manual mobilization to upper thoracic vertebrae. The HEP was updated to include progressed scapular retraction and the added thoracic extension stretch, with a printed handout provided. She tolerated standing exercises fairly well, noticing some increased knee pain with modified tandem stance. She continued to benefit from skilled PT to address strength and mobility deficits to achieve functional activity.  Based off experience c traction, recommend discontinued use due to pain exacerbations.   OBJECTIVE IMPAIRMENTS Abnormal gait, decreased activity tolerance, decreased coordination, decreased endurance, decreased mobility, difficulty walking, decreased ROM, decreased strength, hypomobility, impaired perceived functional ability, impaired flexibility, impaired UE functional use, improper body mechanics, postural dysfunction, and pain.   ACTIVITY LIMITATIONS carrying, lifting, bending, sitting, standing, squatting, sleeping, stairs, transfers, bed mobility, reach  over head, and locomotion level  PARTICIPATION LIMITATIONS: meal prep, cleaning, laundry, interpersonal relationship, shopping, and community activity  PERSONAL FACTORS  Multiple treatment areas, length of time since onset, surgical history, DDD lumbar, depression, DM, HTN, hypothyroidism, obesity, arthritis. lumbar fusion and cervical fusion c4-   are also affecting patient's functional outcome.   REHAB POTENTIAL: Fair to good  CLINICAL DECISION MAKING: Evolving/moderate complexity  EVALUATION COMPLEXITY: Moderate   GOALS: Goals reviewed with patient? Yes  Short term PT Goals (target date for Short term goals are 3 weeks 10/10/2021) Patient will demonstrate independent use of home exercise program to maintain progress from in clinic treatments. Goal status: met 10/09/2021   Long term PT goals (target dates for all long term goals are 10 weeks  11/28/2021 )  1. Patient will demonstrate/report pain at worst less than or equal to  2/10 to facilitate minimal limitation in daily activity secondary to pain symptoms. Goal status: ongoing 10/16/21  2. Patient will demonstrate independent use of home exercise program to facilitate ability to maintain/progress functional gains from skilled physical therapy services. Goal status: ongoing 10/16/21  3. Patient will demonstrate FOTO knee outcome > or = 55 % to indicate reduced disability due to condition. Goal status: ongoing 10/09/21  4.  Patient will demonstrate Lt LE MMT 5/5 throughout to facilitate ability to perform usual standing, walking, stairs at PLOF s limitation due to symptoms. Goal status: ongoing 10/09/21  5.  Patient will demonstrate lumbar AROM > or = 75% WFL to facilitate upright standing/walking posture at PLOF.    Goal status: ongoing 10/09/21  6.  Patient will demonstrate cervical AROM improvements > or = 10 degrees on average to facilitate improved cervical mobility c daily activity.   Goal status: ongoing 10/09/21  7.  Patient will demonstrate/report ability to perform sitting > 1hr, standing and walking > 30 mins each for improvement in tolerance to static postures.  a.  Goal Status: ongoing 10/09/21  8. Patient will demonstrate/report ascending/descending stairs s UE assist c reciprocal gait pattern for house entry.  a.  Goal Status: ongoing 10/09/21  PLAN: PT FREQUENCY: 1-2x/week  PT DURATION: 10 weeks  PLANNED INTERVENTIONS: Therapeutic exercises, Therapeutic activity, Neuro Muscular re-education, Balance training, Gait training, Patient/Family education, Joint mobilization, Stair training, DME instructions, Dry Needling, Electrical stimulation, Cryotherapy, Moist heat, Taping, Ultrasound, Ionotophoresis 31m/ml Dexamethasone, and Manual therapy.  All included unless contraindicated.  PLAN FOR NEXT SESSION: continue to progress general strength and mobility, promote lumbar and thoracic mobility with strengthening cervical and scapular retraction,  progressive WB exercise and step negotiation as able  KJana Hakim SPT 10/16/21, 4:22 PM  This entire session of physical therapy was performed under the direct supervision of PT signing evaluation /treatment. PT reviewed note and agrees.  MScot Jun PT, DPT, OCS, ATC 10/16/21 4:24 PM

## 2021-10-21 ENCOUNTER — Ambulatory Visit (INDEPENDENT_AMBULATORY_CARE_PROVIDER_SITE_OTHER): Payer: 59 | Admitting: Physical Therapy

## 2021-10-21 ENCOUNTER — Encounter: Payer: Self-pay | Admitting: Physical Therapy

## 2021-10-21 DIAGNOSIS — R293 Abnormal posture: Secondary | ICD-10-CM | POA: Diagnosis not present

## 2021-10-21 DIAGNOSIS — M542 Cervicalgia: Secondary | ICD-10-CM | POA: Diagnosis not present

## 2021-10-21 DIAGNOSIS — R262 Difficulty in walking, not elsewhere classified: Secondary | ICD-10-CM

## 2021-10-21 DIAGNOSIS — M25562 Pain in left knee: Secondary | ICD-10-CM

## 2021-10-21 DIAGNOSIS — M5459 Other low back pain: Secondary | ICD-10-CM

## 2021-10-21 DIAGNOSIS — G8929 Other chronic pain: Secondary | ICD-10-CM

## 2021-10-21 DIAGNOSIS — M6281 Muscle weakness (generalized): Secondary | ICD-10-CM

## 2021-10-21 NOTE — Therapy (Signed)
OUTPATIENT PHYSICAL THERAPY TREATMENT   Patient Name: [name] [name] MRN: [id number] DOB:Dec 26, 1965, 56 y.o., female Today's Date: 10/21/2021   End of Session:  PT End of Session - 10/21/21 1525     Visit Number 8    Number of Visits 20    Date for PT Re-Evaluation 11/28/21    Authorization Type [employer] no copay    Progress Note Due on Visit 15    PT Start Time 1518    PT Stop Time 1558    PT Time Calculation (min) 40 min    Activity Tolerance Patient tolerated treatment well;No increased pain    Behavior During Therapy WFL for tasks assessed/performed              Past Medical History:  Diagnosis Date   Acute bronchitis due to COVID-19 virus 09/06/2020   Anxiety 01/12/2013   Arthralgia 07/08/2016   Asthma    Atrial fibrillation ([place]) 12/06/2018   B12 deficiency 01/31/2018   Chicken pox    Chronic insomnia 11/24/2016   Clostridium difficile infection 04/02/2014, 02/06/2014   Complex tear of medial meniscus of left knee as current injury 12/01/2018   Complex tear of medial meniscus of left knee as current injury 12/01/2018   DDD (degenerative disc disease), lumbar 09/09/2018   Depression    Diabetes mellitus without complication ([place]) 62/04/6331   Dyslipidemia    Elevated hemoglobin A1c 07/08/2016   Estrogen deficiency 12/06/2018   Foraminal stenosis of lumbar region 09/09/2018   Hair loss 07/20/2017   History of lumbar fusion 09/09/2018   Hormone replacement therapy (HRT) 07/20/2017   HTN (hypertension)    Hypertension 07/08/2016   Hypokalemia 12/06/2018   Hypothyroidism    Left medial tibial plateau fracture 11/10/2018   Lumbar radiculopathy 09/09/2018   Referred to NS 09/09/2018   Migraine    Mild memory disturbance 11/13/2013   Morbid obesity ([place]) 07/08/2016   Multiple allergies    Obesity    Rash associated with COVID-19 10/02/2020   Restless legs syndrome (RLS) 01/12/2013   Follows with Dr. Jannifer Franklin who prescribes Requip and gabapentin   RLS (restless legs syndrome)     Vitamin D deficiency 12/06/2018   Wears glasses 07/08/2016   Past Surgical History:  Procedure Laterality Date   BREAST BIOPSY  [id number]   x 2 benign lesion   CERVICAL DISCECTOMY  2003   CERVICAL FUSION  2010   x2   Hand fracture surgery     LUMBAR FUSION  2016   VAGINAL HYSTERECTOMY  2009   Patient Active Problem List   Diagnosis Date Noted   Primary osteoarthritis of left knee 08/05/2021   Cervical fusion syndrome 07/09/2021   Chronic pain syndrome 07/09/2021   Myofascial pain syndrome of thoracic spine 05/29/2021   Cervical radiculopathy 05/29/2021   Chicken pox 04/21/2021   Asthma 04/21/2021   Stress fracture of rib 04/15/2021   Scapular dysfunction 04/02/2021   Effusion, left shoulder 03/26/2021   AC joint derangement 03/26/2021   BMI 45.0-49.9, adult ([place]) 03/26/2021   Neuropathy 02/18/2021   Multiple allergies 10/02/2020   Acquired thrombophilia (HCC)-eliquis 12/20/2019   Hypokalemia 12/06/2018   Atrial fibrillation ([place]) 12/06/2018   Vitamin D deficiency 12/06/2018   Estrogen deficiency 12/06/2018   Clostridium difficile infection 12/01/2018   DDD (degenerative disc disease), lumbar 09/09/2018   Foraminal stenosis of lumbar region 09/09/2018   History of lumbar fusion 09/09/2018   Lumbar radiculopathy 09/09/2018   B12 deficiency 01/31/2018   Hormone replacement therapy (  HRT) 07/20/2017   Chronic insomnia 11/24/2016   Elevated hemoglobin A1c 07/08/2016   Hypertension 07/08/2016   Arthralgia 07/08/2016   Morbid obesity ([place]) 07/08/2016   Hypothyroidism    Dyslipidemia    Depression    Migraine 11/13/2013   Restless legs syndrome (RLS) 01/12/2013   Anxiety 01/12/2013    PCP: Howard Pouch DO  REFERRING PROVIDER: Marybelle Killings, MD   REFERRING DIAG: [phone] (ICD-10-CM) - Primary osteoarthritis of left knee Z98.1 (ICD-10-CM) - History of lumbar fusion M54.2 (ICD-10-CM) - Neck pain  Rationale for Evaluation and Treatment Rehabilitation  THERAPY DIAG:   Other low back pain  Cervicalgia  Chronic pain of left knee  Abnormal posture  Muscle weakness (generalized)  Difficulty in walking, not elsewhere classified  ONSET DATE: 03/02/2021  SUBJECTIVE:                                                                                                                                                                                           SUBJECTIVE STATEMENT: neck and back pain doing good, Knee pain is killing her today.  PERTINENT HISTORY:  DDD lumbar, depression, DM, HTN, hypothyroidism, obesity, arthritis. lumbar fusion and cervical fusion c4-7  PAIN:  NPRS scale: 8/10 pain in her knee, 0/10 back and neck Pain location: Back, neck/shoulders, Lt knee Pain description: back: consistent, dull, intense   neck: shooting   Lt knee: giving way/intense - sharp Aggravating factors:  carrying items, housework,  standing/walking prolonged, stairs, squatting. Bed mobility turning, sitting prolonged  Relieving factors: massage at home, heat pad, cervical traction over door.    PRECAUTIONS: history of lumbar and cervical fusions  WEIGHT BEARING RESTRICTIONS No  FALLS:  Has patient fallen in last 6 months? No  LIVING ENVIRONMENT: Lives with: lives with their family Lives in: House/apartment Stairs: 4 stairs to enter, has rails but can use only one  OCCUPATION: [employer] work with sitting required.   PLOF: Independent, housework, yardwork  PATIENT GOALS   "get pain under control"      OBJECTIVE:   DIAGNOSTIC FINDINGS:  09/19/2021:  Multiple imaging over time, see in Epic chart  PATIENT SURVEYS:  09/19/2021 FOTO knee: 33, goal 74  SCREENING FOR RED FLAGS: 09/19/2021 Bowel or bladder incontinence: No Cauda equina syndrome: No  COGNITION: 09/19/2021 Overall cognitive status: Within functional limits for tasks assessed     SENSATION: 09/19/2021 unremarkable for changes  MUSCLE LENGTH: 09/19/2021 No specific tested  today  POSTURE: 09/19/2021  Forward head posture, rounded shoulders, reduced lumbar lordosis  PALPATION:   10/16/2021 Tenderness over spinous processes in upper thoracic spine, none in lower cervical spine or paraspinals  09/19/2021 General tenderness noted in Lt middle rhomboids, lower trap, infraspinatus.  Bilateral upper trap tenderness and increased tightness.  Mild tenderness Rt QL, inferior lumbar paraspinals.    Tenderness to touch anterior/lateral joint line Lt knee.   CERVICAL/ LUMBAR ROM:   Active  AROM  09/19/2021 AROM 10/09/2021  Lumbar Flexion To patella To midshin  Lumbar  Extension 25 % in standing 50% in standing  Lumbar  Right lateral flexion To mid thigh c Rt lumbar pain To midthigh, painfree  Lumbar  Left lateral flexion To mid thigh c Rt lumbar pain To midthigh, painfree      Cervical flexion 30 46  Cervical extension 45 43  Cervical Lt rotation 45 59  Cervical Rt rotation 58 53   (Blank rows = not tested)  LOWER EXTREMITY ROM:      Right 09/19/2021 Left 09/19/2021  Hip flexion 95 Passive ROM in supine 95 Passive ROM in supine  Hip extension    Hip abduction    Hip adduction    Hip internal rotation    Hip external rotation 45 Passive ROM in supine 40 Passive ROM in supine  Knee flexion    Knee extension 0 AROM in seated LAQ 0 AROM in seated LAQ c pain noted  Ankle dorsiflexion    Ankle plantarflexion    Ankle inversion    Ankle eversion     (Blank rows = not tested)  LOWER EXTREMITY MMT:     09/19/2021:  General resisted static testing in supine hooklying : bilateral hip abd and add strong and painless MMT Right 09/19/2021 Left 09/19/2021 Left 09/19/2021  Hip flexion 5/5 5/5   Hip extension     Hip abduction     Hip adduction     Hip internal rotation     Hip external rotation     Knee flexion 5/5 5/5   Knee extension 5/5 4/5 4/5  Ankle dorsiflexion     Ankle plantarflexion     Ankle inversion     Ankle eversion      (Blank rows = not  tested)  LUMBAR SPECIAL TESTS:  09/19/2021  Slump test Lt:     Rt:     Spurling's test neg bil.  FUNCTIONAL TESTS:   09/19/2021:    5x Sit to stand:  18.99 seconds    18 inch chair transfer:  able to perform without s UE  GAIT: 09/19/2021 Independent ambulation c mild deviation of reduced stance phase on Lt knee, mild forward trunk lean.  Household based distances observed in clinic.     TODAY'S TREATMENT  10/21/21 -UBE for arms X 3 min fwd and 3 min retro L1 -Seated bilat shoulder ER green 2X10 -Seated H abd with green 2X10 -Seated rows with green 2X10 -Seated bilat shoulder flexion 2# bar 2X10 -Seated thoracic extension X 10 bilat -Seated cervical retractions X 10 bilat -Standing hip extension X 10 bilat -Standing hip abd X 10 bilat   10/16/21 TherEx: -Standing heel to toes with fingertip UE support x 15 -Standing green band scaption with looking at hands x 10 unilaterally, x 10 bil. -Seated cervical retraction x 10 with 3s hold -Seated shoulder external rotation and scap retraction with green band 3s hold x 10 -Seated thoracic extension x 20 over back of chair -Seated bil. shoulder flexion 1 lb bar x 10, standing x 10 -Standing open book thoracic rotation x 8 bil. -Modified tandem stance x 30s bil, increased pain with RLE posterior -PT printed and reviewed  updated HEP and discussed indications/contraindications for cervical traction, pt verbalized understanding  Manual -Seated PA grade 3 mobs T3-T6  cPA   PATIENT EDUCATION:  Education details: HEP, POC Person educated: Patient Education method: Explanation, Demonstration, Verbal cues, and Handouts Education comprehension: verbalized understanding, returned demonstration, and verbal cues required   HOME EXERCISE PROGRAM: Access Code: [email or web address] URL: [email or web address] Date: 10/16/2021 Prepared by: Scot Jun  Exercises - Supine Lower Trunk Rotation  - 2-3 x daily - 7 x weekly - 1 sets - 3-5  reps - 30 hold - Supine Bridge  - 1-2 x daily - 7 x weekly - 2-3 sets - 10 reps - Supine Cervical Retraction with Towel  - 1-2 x daily - 7 x weekly - 2-3 sets - 10 reps - 5 hold - Seated Cervical Retraction  - 1-2 x daily - 7 x weekly - 3 sets - 10 reps - Standing Shoulder Row with Anchored Resistance  - 1-2 x daily - 7 x weekly - 1-2 sets - 10 reps - Seated Straight Leg Heel Taps  - 1-2 x daily - 7 x weekly - 3 sets - 10 reps - Hooklying Clamshell with Resistance  - 1-2 x daily - 7 x weekly - 1-2 sets - 10 reps - Shoulder External Rotation and Scapular Retraction with Resistance  - 2-3 x daily - 7 x weekly - 1-2 sets - 10 reps - Seated Thoracic Lumbar Extension  - 1-2 x daily - 7 x weekly - 1 sets - 10 reps  ASSESSMENT:  CLINICAL IMPRESSION: Standing activity limited today by overall knee pain. Focused more on sitting exercises for postural strengthening and spinal mobility today to her tolerance. PT recommending to continue current POC.  Based off experience c traction, recommend discontinued use due to pain exacerbations.   OBJECTIVE IMPAIRMENTS Abnormal gait, decreased activity tolerance, decreased coordination, decreased endurance, decreased mobility, difficulty walking, decreased ROM, decreased strength, hypomobility, impaired perceived functional ability, impaired flexibility, impaired UE functional use, improper body mechanics, postural dysfunction, and pain.   ACTIVITY LIMITATIONS carrying, lifting, bending, sitting, standing, squatting, sleeping, stairs, transfers, bed mobility, reach over head, and locomotion level  PARTICIPATION LIMITATIONS: meal prep, cleaning, laundry, interpersonal relationship, shopping, and community activity  PERSONAL FACTORS  Multiple treatment areas, length of time since onset, surgical history, DDD lumbar, depression, DM, HTN, hypothyroidism, obesity, arthritis. lumbar fusion and cervical fusion c4-   are also affecting patient's functional outcome.    REHAB POTENTIAL: Fair to good  CLINICAL DECISION MAKING: Evolving/moderate complexity  EVALUATION COMPLEXITY: Moderate   GOALS: Goals reviewed with patient? Yes  Short term PT Goals (target date for Short term goals are 3 weeks 10/10/2021) Patient will demonstrate independent use of home exercise program to maintain progress from in clinic treatments. Goal status: met 10/09/2021   Long term PT goals (target dates for all long term goals are 10 weeks  11/28/2021 )  1. Patient will demonstrate/report pain at worst less than or equal to 2/10 to facilitate minimal limitation in daily activity secondary to pain symptoms. Goal status: ongoing 10/16/21  2. Patient will demonstrate independent use of home exercise program to facilitate ability to maintain/progress functional gains from skilled physical therapy services. Goal status: ongoing 10/16/21  3. Patient will demonstrate FOTO knee outcome > or = 55 % to indicate reduced disability due to condition. Goal status: ongoing 10/09/21  4.  Patient will demonstrate Lt LE MMT 5/5 throughout to facilitate ability to perform usual standing, walking, stairs at PLOF s  limitation due to symptoms. Goal status: ongoing 10/09/21  5.  Patient will demonstrate lumbar AROM > or = 75% WFL to facilitate upright standing/walking posture at PLOF.    Goal status: ongoing 10/09/21  6.  Patient will demonstrate cervical AROM improvements > or = 10 degrees on average to facilitate improved cervical mobility c daily activity.   Goal status: ongoing 10/09/21  7.  Patient will demonstrate/report ability to perform sitting > 1hr, standing and walking > 30 mins each for improvement in tolerance to static postures.  a.  Goal Status: ongoing 10/09/21  8. Patient will demonstrate/report ascending/descending stairs s UE assist c reciprocal gait pattern for house entry.  a.  Goal Status: ongoing 10/09/21  PLAN: PT FREQUENCY: 1-2x/week  PT DURATION: 10 weeks  PLANNED  INTERVENTIONS: Therapeutic exercises, Therapeutic activity, Neuro Muscular re-education, Balance training, Gait training, Patient/Family education, Joint mobilization, Stair training, DME instructions, Dry Needling, Electrical stimulation, Cryotherapy, Moist heat, Taping, Ultrasound, Ionotophoresis 80m/ml Dexamethasone, and Manual therapy.  All included unless contraindicated.  PLAN FOR NEXT SESSION: continue to progress general strength and mobility, promote lumbar and thoracic mobility with strengthening cervical and scapular retraction, progressive WB exercise and step negotiation as able depending on knee pain  BElsie Ra PT, DPT 10/21/21 3:27 PM

## 2021-10-23 ENCOUNTER — Ambulatory Visit (INDEPENDENT_AMBULATORY_CARE_PROVIDER_SITE_OTHER): Payer: 59 | Admitting: Physical Therapy

## 2021-10-23 ENCOUNTER — Encounter: Payer: Self-pay | Admitting: Physical Therapy

## 2021-10-23 DIAGNOSIS — M542 Cervicalgia: Secondary | ICD-10-CM

## 2021-10-23 DIAGNOSIS — R293 Abnormal posture: Secondary | ICD-10-CM | POA: Diagnosis not present

## 2021-10-23 DIAGNOSIS — M5459 Other low back pain: Secondary | ICD-10-CM

## 2021-10-23 DIAGNOSIS — G8929 Other chronic pain: Secondary | ICD-10-CM

## 2021-10-23 DIAGNOSIS — M25562 Pain in left knee: Secondary | ICD-10-CM

## 2021-10-23 DIAGNOSIS — M6281 Muscle weakness (generalized): Secondary | ICD-10-CM

## 2021-10-23 DIAGNOSIS — R262 Difficulty in walking, not elsewhere classified: Secondary | ICD-10-CM

## 2021-10-23 NOTE — Therapy (Signed)
OUTPATIENT PHYSICAL THERAPY TREATMENT   Patient Name: OTIE HEADLEE MRN: [id number] DOB:1966-02-25, 56 y.o., female Today's Date: 10/23/2021   End of Session:  PT End of Session - 10/23/21 1527     Visit Number 9    Number of Visits 20    Date for PT Re-Evaluation 11/28/21    Authorization Type [employer] no copay    Progress Note Due on Visit 15    PT Start Time 1514    PT Stop Time 1552    PT Time Calculation (min) 38 min    Activity Tolerance Patient tolerated treatment well    Behavior During Therapy [facility] for tasks assessed/performed              Past Medical History:  Diagnosis Date   Acute bronchitis due to COVID-19 virus 09/06/2020   Anxiety 01/12/2013   Arthralgia 07/08/2016   Asthma    Atrial fibrillation ([place]) 12/06/2018   B12 deficiency 01/31/2018   Chicken pox    Chronic insomnia 11/24/2016   Clostridium difficile infection 04/02/2014, 02/06/2014   Complex tear of medial meniscus of left knee as current injury 12/01/2018   Complex tear of medial meniscus of left knee as current injury 12/01/2018   DDD (degenerative disc disease), lumbar 09/09/2018   Depression    Diabetes mellitus without complication ([place]) 88/06/1658   Dyslipidemia    Elevated hemoglobin A1c 07/08/2016   Estrogen deficiency 12/06/2018   Foraminal stenosis of lumbar region 09/09/2018   Hair loss 07/20/2017   History of lumbar fusion 09/09/2018   Hormone replacement therapy (HRT) 07/20/2017   HTN (hypertension)    Hypertension 07/08/2016   Hypokalemia 12/06/2018   Hypothyroidism    Left medial tibial plateau fracture 11/10/2018   Lumbar radiculopathy 09/09/2018   Referred to NS 09/09/2018   Migraine    Mild memory disturbance 11/13/2013   Morbid obesity ([place]) 07/08/2016   Multiple allergies    Obesity    Rash associated with COVID-19 10/02/2020   Restless legs syndrome (RLS) 01/12/2013   Follows with Dr. Jannifer Franklin who prescribes Requip and gabapentin   RLS (restless legs syndrome)    Vitamin D  deficiency 12/06/2018   Wears glasses 07/08/2016   Past Surgical History:  Procedure Laterality Date   BREAST BIOPSY  6301,6010   x 2 benign lesion   CERVICAL DISCECTOMY  2003   CERVICAL FUSION  2010   x2   Hand fracture surgery     LUMBAR FUSION  2016   VAGINAL HYSTERECTOMY  2009   Patient Active Problem List   Diagnosis Date Noted   Primary osteoarthritis of left knee 08/05/2021   Cervical fusion syndrome 07/09/2021   Chronic pain syndrome 07/09/2021   Myofascial pain syndrome of thoracic spine 05/29/2021   Cervical radiculopathy 05/29/2021   Chicken pox 04/21/2021   Asthma 04/21/2021   Stress fracture of rib 04/15/2021   Scapular dysfunction 04/02/2021   Effusion, left shoulder 03/26/2021   AC joint derangement 03/26/2021   BMI 45.0-49.9, adult ([place]) 03/26/2021   Neuropathy 02/18/2021   Multiple allergies 10/02/2020   Acquired thrombophilia (HCC)-eliquis 12/20/2019   Hypokalemia 12/06/2018   Atrial fibrillation ([place]) 12/06/2018   Vitamin D deficiency 12/06/2018   Estrogen deficiency 12/06/2018   Clostridium difficile infection 12/01/2018   DDD (degenerative disc disease), lumbar 09/09/2018   Foraminal stenosis of lumbar region 09/09/2018   History of lumbar fusion 09/09/2018   Lumbar radiculopathy 09/09/2018   B12 deficiency 01/31/2018   Hormone replacement therapy (HRT) 07/20/2017  Chronic insomnia 11/24/2016   Elevated hemoglobin A1c 07/08/2016   Hypertension 07/08/2016   Arthralgia 07/08/2016   Morbid obesity ([place]) 07/08/2016   Hypothyroidism    Dyslipidemia    Depression    Migraine 11/13/2013   Restless legs syndrome (RLS) 01/12/2013   Anxiety 01/12/2013    PCP: Howard Pouch DO  REFERRING PROVIDER: Marybelle Killings, MD   REFERRING DIAG: [phone] (ICD-10-CM) - Primary osteoarthritis of left knee Z98.1 (ICD-10-CM) - History of lumbar fusion M54.2 (ICD-10-CM) - Neck pain  Rationale for Evaluation and Treatment Rehabilitation  THERAPY DIAG:  Other low  back pain  Cervicalgia  Chronic pain of left knee  Abnormal posture  Muscle weakness (generalized)  Difficulty in walking, not elsewhere classified  ONSET DATE: 03/02/2021  SUBJECTIVE:                                                                                                                                                                                           SUBJECTIVE STATEMENT: She says pain is still bad in her knee.  PERTINENT HISTORY:  DDD lumbar, depression, DM, HTN, hypothyroidism, obesity, arthritis. lumbar fusion and cervical fusion c4-7  PAIN:  NPRS scale: 6/10 in knee, neck and back are okay today Pain location: Back, neck/shoulders, Lt knee Pain description: back: consistent, dull, intense   neck: shooting   Lt knee: giving way/intense - sharp Aggravating factors:  carrying items, housework,  standing/walking prolonged, stairs, squatting. Bed mobility turning, sitting prolonged  Relieving factors: massage at home, heat pad, cervical traction over door.    PRECAUTIONS: history of lumbar and cervical fusions  WEIGHT BEARING RESTRICTIONS No  FALLS:  Has patient fallen in last 6 months? No  LIVING ENVIRONMENT: Lives with: lives with their family Lives in: House/apartment Stairs: 4 stairs to enter, has rails but can use only one  OCCUPATION: Dispatch work with sitting required.   PLOF: Independent, housework, yardwork  PATIENT GOALS   "get pain under control"      OBJECTIVE:   DIAGNOSTIC FINDINGS:  09/19/2021:  Multiple imaging over time, see in Epic chart  PATIENT SURVEYS:  09/19/2021 FOTO knee: 33, goal 9  SCREENING FOR RED FLAGS: 09/19/2021 Bowel or bladder incontinence: No Cauda equina syndrome: No  COGNITION: 09/19/2021 Overall cognitive status: Within functional limits for tasks assessed     SENSATION: 09/19/2021 unremarkable for changes  MUSCLE LENGTH: 09/19/2021 No specific tested today  POSTURE: 09/19/2021  Forward head  posture, rounded shoulders, reduced lumbar lordosis  PALPATION:   10/16/2021 Tenderness over spinous processes in upper thoracic spine, none in lower cervical spine or paraspinals  09/19/2021 General tenderness noted in Lt  middle rhomboids, lower trap, infraspinatus.  Bilateral upper trap tenderness and increased tightness.  Mild tenderness Rt QL, inferior lumbar paraspinals.    Tenderness to touch anterior/lateral joint line Lt knee.   CERVICAL/ LUMBAR ROM:   Active  AROM  09/19/2021 AROM 10/09/2021  Lumbar Flexion To patella To midshin  Lumbar  Extension 25 % in standing 50% in standing  Lumbar  Right lateral flexion To mid thigh c Rt lumbar pain To midthigh, painfree  Lumbar  Left lateral flexion To mid thigh c Rt lumbar pain To midthigh, painfree      Cervical flexion 30 46  Cervical extension 45 43  Cervical Lt rotation 45 59  Cervical Rt rotation 58 53   (Blank rows = not tested)  LOWER EXTREMITY ROM:      Right 09/19/2021 Left 09/19/2021  Hip flexion 95 Passive ROM in supine 95 Passive ROM in supine  Hip extension    Hip abduction    Hip adduction    Hip internal rotation    Hip external rotation 45 Passive ROM in supine 40 Passive ROM in supine  Knee flexion    Knee extension 0 AROM in seated LAQ 0 AROM in seated LAQ c pain noted  Ankle dorsiflexion    Ankle plantarflexion    Ankle inversion    Ankle eversion     (Blank rows = not tested)  LOWER EXTREMITY MMT:     09/19/2021:  General resisted static testing in supine hooklying : bilateral hip abd and add strong and painless MMT Right 09/19/2021 Left 09/19/2021 Left 09/19/2021  Hip flexion 5/5 5/5   Hip extension     Hip abduction     Hip adduction     Hip internal rotation     Hip external rotation     Knee flexion 5/5 5/5   Knee extension 5/5 4/5 4/5  Ankle dorsiflexion     Ankle plantarflexion     Ankle inversion     Ankle eversion      (Blank rows = not tested)  LUMBAR SPECIAL TESTS:  09/19/2021   Slump test Lt:     Rt:     Spurling's test neg bil.  FUNCTIONAL TESTS:   09/19/2021:    5x Sit to stand:  18.99 seconds    18 inch chair transfer:  able to perform without s UE  GAIT: 09/19/2021 Independent ambulation c mild deviation of reduced stance phase on Lt knee, mild forward trunk lean.  Household based distances observed in clinic.     TODAY'S TREATMENT  10/23/21 -Seated bilat shoulder ER green x15 -Seated H abd with green X15 -Seated rows with green X15 -Seated bilat shoulder flexion 2# bar X10 -Seated lumbar flexion stretch with pball 5 sec X 10 -Seated ab set with pball isometric shoulder extension 5 sec hold X10 -Seated ab set with pball isometric hip flexion 5 sec X 10 bilat -Seated cervical retractions X 10 bilat -Seated LAQ 2# X 10 bilat -Seated SLR X 10 bilat -Standing hamstring curl X 10 bilat -Standing hip abd X 10 bilat  10/21/21 -UBE for arms X 3 min fwd and 3 min retro L1 -Seated bilat shoulder ER green 2X10 -Seated H abd with green 2X10 -Seated rows with green 2X10 -Seated bilat shoulder flexion 2# bar 2X10 -Seated lumbar flexion stretch with pball 5 sec X 10 -Seated ab set with pball isometric shoulder extension 5 sec hold X10 -Seated ab set with pball isometric hip flexion 5 sec X 10  bilat -Seated cervical retractions X 10 bilat -Standing hip ext X 10 bilat -Standing hip abd X 10 bilat  10/16/21 TherEx: -Standing heel to toes with fingertip UE support x 15 -Standing green band scaption with looking at hands x 10 unilaterally, x 10 bil. -Seated cervical retraction x 10 with 3s hold -Seated shoulder external rotation and scap retraction with green band 3s hold x 10 -Seated thoracic extension x 20 over back of chair -Seated bil. shoulder flexion 1 lb bar x 10, standing x 10 -Standing open book thoracic rotation x 8 bil. -Modified tandem stance x 30s bil, increased pain with RLE posterior -PT printed and reviewed updated HEP and discussed  indications/contraindications for cervical traction, pt verbalized understanding  Manual -Seated PA grade 3 mobs T3-T6  cPA   PATIENT EDUCATION:  Education details: HEP, POC Person educated: Patient Education method: Consulting civil engineer, Demonstration, Verbal cues, and Handouts Education comprehension: verbalized understanding, returned demonstration, and verbal cues required   HOME EXERCISE PROGRAM: Access Code: [email or web address] URL: [email or web address] Date: 10/16/2021 Prepared by: Scot Jun  Exercises - Supine Lower Trunk Rotation  - 2-3 x daily - 7 x weekly - 1 sets - 3-5 reps - 30 hold - Supine Bridge  - 1-2 x daily - 7 x weekly - 2-3 sets - 10 reps - Supine Cervical Retraction with Towel  - 1-2 x daily - 7 x weekly - 2-3 sets - 10 reps - 5 hold - Seated Cervical Retraction  - 1-2 x daily - 7 x weekly - 3 sets - 10 reps - Standing Shoulder Row with Anchored Resistance  - 1-2 x daily - 7 x weekly - 1-2 sets - 10 reps - Seated Straight Leg Heel Taps  - 1-2 x daily - 7 x weekly - 3 sets - 10 reps - Hooklying Clamshell with Resistance  - 1-2 x daily - 7 x weekly - 1-2 sets - 10 reps - Shoulder External Rotation and Scapular Retraction with Resistance  - 2-3 x daily - 7 x weekly - 1-2 sets - 10 reps - Seated Thoracic Lumbar Extension  - 1-2 x daily - 7 x weekly - 1 sets - 10 reps  ASSESSMENT:  CLINICAL IMPRESSION:  Neck and back pain have improved but she has limited with standing activity due to knee pain. PT recommending she have knee checked out again by MD due to continued pain and she shows understanding of this recommendation.  Based off experience c traction, recommend discontinued use due to pain exacerbations.   OBJECTIVE IMPAIRMENTS Abnormal gait, decreased activity tolerance, decreased coordination, decreased endurance, decreased mobility, difficulty walking, decreased ROM, decreased strength, hypomobility, impaired perceived functional ability, impaired  flexibility, impaired UE functional use, improper body mechanics, postural dysfunction, and pain.   ACTIVITY LIMITATIONS carrying, lifting, bending, sitting, standing, squatting, sleeping, stairs, transfers, bed mobility, reach over head, and locomotion level  PARTICIPATION LIMITATIONS: meal prep, cleaning, laundry, interpersonal relationship, shopping, and community activity  PERSONAL FACTORS  Multiple treatment areas, length of time since onset, surgical history, DDD lumbar, depression, DM, HTN, hypothyroidism, obesity, arthritis. lumbar fusion and cervical fusion c4-   are also affecting patient's functional outcome.   REHAB POTENTIAL: Fair to good  CLINICAL DECISION MAKING: Evolving/moderate complexity  EVALUATION COMPLEXITY: Moderate   GOALS: Goals reviewed with patient? Yes  Short term PT Goals (target date for Short term goals are 3 weeks 10/10/2021) Patient will demonstrate independent use of home exercise program to maintain progress from in clinic treatments. Goal status: met 10/09/2021  Long term PT goals (target dates for all long term goals are 10 weeks  11/28/2021 )  1. Patient will demonstrate/report pain at worst less than or equal to 2/10 to facilitate minimal limitation in daily activity secondary to pain symptoms. Goal status: ongoing 10/16/21  2. Patient will demonstrate independent use of home exercise program to facilitate ability to maintain/progress functional gains from skilled physical therapy services. Goal status: ongoing 10/16/21  3. Patient will demonstrate FOTO knee outcome > or = 55 % to indicate reduced disability due to condition. Goal status: ongoing 10/09/21  4.  Patient will demonstrate Lt LE MMT 5/5 throughout to facilitate ability to perform usual standing, walking, stairs at PLOF s limitation due to symptoms. Goal status: ongoing 10/09/21  5.  Patient will demonstrate lumbar AROM > or = 75% WFL to facilitate upright standing/walking posture at  PLOF.    Goal status: ongoing 10/09/21  6.  Patient will demonstrate cervical AROM improvements > or = 10 degrees on average to facilitate improved cervical mobility c daily activity.   Goal status: ongoing 10/09/21  7.  Patient will demonstrate/report ability to perform sitting > 1hr, standing and walking > 30 mins each for improvement in tolerance to static postures.  a.  Goal Status: ongoing 10/09/21  8. Patient will demonstrate/report ascending/descending stairs s UE assist c reciprocal gait pattern for house entry.  a.  Goal Status: ongoing 10/09/21  PLAN: PT FREQUENCY: 1-2x/week  PT DURATION: 10 weeks  PLANNED INTERVENTIONS: Therapeutic exercises, Therapeutic activity, Neuro Muscular re-education, Balance training, Gait training, Patient/Family education, Joint mobilization, Stair training, DME instructions, Dry Needling, Electrical stimulation, Cryotherapy, Moist heat, Taping, Ultrasound, Ionotophoresis 49m/ml Dexamethasone, and Manual therapy.  All included unless contraindicated.  PLAN FOR NEXT SESSION: continue to progress general strength and mobility, promote lumbar and thoracic mobility with strengthening cervical and scapular retraction, progressive WB exercise if pain allows and if not then strengthening in NWB  BElsie Ra PT, DPT 10/23/21 3:28 PM

## 2021-10-27 ENCOUNTER — Encounter: Payer: Self-pay | Admitting: Family Medicine

## 2021-10-28 ENCOUNTER — Telehealth: Payer: 59 | Admitting: Family Medicine

## 2021-10-28 ENCOUNTER — Encounter: Payer: Self-pay | Admitting: Rehabilitative and Restorative Service Providers"

## 2021-10-28 ENCOUNTER — Ambulatory Visit (INDEPENDENT_AMBULATORY_CARE_PROVIDER_SITE_OTHER): Payer: 59 | Admitting: Rehabilitative and Restorative Service Providers"

## 2021-10-28 DIAGNOSIS — M5459 Other low back pain: Secondary | ICD-10-CM | POA: Diagnosis not present

## 2021-10-28 DIAGNOSIS — M6281 Muscle weakness (generalized): Secondary | ICD-10-CM

## 2021-10-28 DIAGNOSIS — M25562 Pain in left knee: Secondary | ICD-10-CM

## 2021-10-28 DIAGNOSIS — R293 Abnormal posture: Secondary | ICD-10-CM | POA: Diagnosis not present

## 2021-10-28 DIAGNOSIS — G8929 Other chronic pain: Secondary | ICD-10-CM

## 2021-10-28 DIAGNOSIS — M542 Cervicalgia: Secondary | ICD-10-CM | POA: Diagnosis not present

## 2021-10-28 DIAGNOSIS — R262 Difficulty in walking, not elsewhere classified: Secondary | ICD-10-CM

## 2021-10-28 NOTE — Therapy (Addendum)
OUTPATIENT PHYSICAL THERAPY TREATMENT /DISCHARGE   Patient Name: [name] [name] MRN: [id number] DOB:[birth year], 56 y.o., female Today's Date: 10/28/2021   End of Session:  PT End of Session - 10/28/21 1524     Visit Number 10    Number of Visits 20    Date for PT Re-Evaluation 11/28/21    Authorization Type [employer] no copay    Progress Note Due on Visit 15    PT Start Time 1525    PT Stop Time 1554    PT Time Calculation (min) 29 min    Activity Tolerance Patient tolerated treatment well    Behavior During Therapy [facility] for tasks assessed/performed               Past Medical History:  Diagnosis Date   Acute bronchitis due to COVID-19 virus 09/06/2020   Anxiety 01/12/2013   Arthralgia 07/08/2016   Asthma    Atrial fibrillation ([place]) 12/06/2018   B12 deficiency 01/31/2018   Chicken pox    Chronic insomnia 11/24/2016   Clostridium difficile infection 04/02/2014, 02/06/2014   Complex tear of medial meniscus of left knee as current injury 12/01/2018   Complex tear of medial meniscus of left knee as current injury 12/01/2018   DDD (degenerative disc disease), lumbar 09/09/2018   Depression    Diabetes mellitus without complication ([place]) 51/10/8414   Dyslipidemia    Elevated hemoglobin A1c 07/08/2016   Estrogen deficiency 12/06/2018   Foraminal stenosis of lumbar region 09/09/2018   Hair loss 07/20/2017   History of lumbar fusion 09/09/2018   Hormone replacement therapy (HRT) 07/20/2017   HTN (hypertension)    Hypertension 07/08/2016   Hypokalemia 12/06/2018   Hypothyroidism    Left medial tibial plateau fracture 11/10/2018   Lumbar radiculopathy 09/09/2018   Referred to NS 09/09/2018   Migraine    Mild memory disturbance 11/13/2013   Morbid obesity ([place]) 07/08/2016   Multiple allergies    Obesity    Rash associated with COVID-19 10/02/2020   Restless legs syndrome (RLS) 01/12/2013   Follows with Dr. Jannifer Franklin who prescribes Requip and gabapentin   RLS (restless legs syndrome)     Vitamin D deficiency 12/06/2018   Wears glasses 07/08/2016   Past Surgical History:  Procedure Laterality Date   BREAST BIOPSY  6063,0160   x 2 benign lesion   CERVICAL DISCECTOMY  2003   CERVICAL FUSION  2010   x2   Hand fracture surgery     LUMBAR FUSION  2016   VAGINAL HYSTERECTOMY  2009   Patient Active Problem List   Diagnosis Date Noted   Primary osteoarthritis of left knee 08/05/2021   Cervical fusion syndrome 07/09/2021   Chronic pain syndrome 07/09/2021   Myofascial pain syndrome of thoracic spine 05/29/2021   Cervical radiculopathy 05/29/2021   Chicken pox 04/21/2021   Asthma 04/21/2021   Stress fracture of rib 04/15/2021   Scapular dysfunction 04/02/2021   Effusion, left shoulder 03/26/2021   AC joint derangement 03/26/2021   BMI 45.0-49.9, adult ([place]) 03/26/2021   Neuropathy 02/18/2021   Multiple allergies 10/02/2020   Acquired thrombophilia (HCC)-eliquis 12/20/2019   Hypokalemia 12/06/2018   Atrial fibrillation ([place]) 12/06/2018   Vitamin D deficiency 12/06/2018   Estrogen deficiency 12/06/2018   Clostridium difficile infection 12/01/2018   DDD (degenerative disc disease), lumbar 09/09/2018   Foraminal stenosis of lumbar region 09/09/2018   History of lumbar fusion 09/09/2018   Lumbar radiculopathy 09/09/2018   B12 deficiency 01/31/2018   Hormone replacement therapy (  HRT) 07/20/2017   Chronic insomnia 11/24/2016   Elevated hemoglobin A1c 07/08/2016   Hypertension 07/08/2016   Arthralgia 07/08/2016   Morbid obesity ([place]) 07/08/2016   Hypothyroidism    Dyslipidemia    Depression    Migraine 11/13/2013   Restless legs syndrome (RLS) 01/12/2013   Anxiety 01/12/2013    PCP: Howard Pouch DO  REFERRING PROVIDER: Marybelle Killings, MD   REFERRING DIAG: [id number] (ICD-10-CM) - Primary osteoarthritis of left knee Z98.1 (ICD-10-CM) - History of lumbar fusion M54.2 (ICD-10-CM) - Neck pain  Rationale for Evaluation and Treatment Rehabilitation  THERAPY DIAG:   Other low back pain  Cervicalgia  Chronic pain of left knee  Abnormal posture  Muscle weakness (generalized)  Difficulty in walking, not elsewhere classified  ONSET DATE: 03/02/2021  SUBJECTIVE:                                                                                                                                                                                           SUBJECTIVE STATEMENT:  Pt indicated possibly having gel shots for knee.  Pt indicated planning to try to hold off until first of year for insurance stuff.  Pt indicated she has been trying to get back to routine (sleeping in bed, etc).  Pt indicated having pain troubles getting use.   PERTINENT HISTORY:  DDD lumbar, depression, DM, HTN, hypothyroidism, obesity, arthritis. lumbar fusion and cervical fusion c4-7  PAIN:  NPRS scale: knee pain 8/10   - low back/hip Rt : 4/10 Pain location: Back, neck/shoulders, Lt knee Pain description: back: consistent, dull, intense   neck: shooting   Lt knee: giving way/intense - sharp Aggravating factors:  walking, standing, bending for knee.  Relieving factors: massage at home, heat pad, cervical traction over door.    PRECAUTIONS: history of lumbar and cervical fusions  WEIGHT BEARING RESTRICTIONS No  FALLS:  Has patient fallen in last 6 months? No  LIVING ENVIRONMENT: Lives with: lives with their family Lives in: House/apartment Stairs: 4 stairs to enter, has rails but can use only one  OCCUPATION: [employer] work with sitting required.   PLOF: Independent, housework, yardwork  PATIENT GOALS   "get pain under control"   OBJECTIVE:   DIAGNOSTIC FINDINGS:  09/19/2021:  Multiple imaging over time, see in Epic chart  PATIENT SURVEYS:   09/19/2021 FOTO knee: 33, goal 59  SCREENING FOR RED FLAGS: 09/19/2021 Bowel or bladder incontinence: No Cauda equina syndrome: No  COGNITION: 09/19/2021 Overall cognitive status: Within functional limits for tasks  assessed     SENSATION: 09/19/2021 unremarkable for changes  MUSCLE LENGTH: 09/19/2021 No specific tested today  POSTURE:  09/19/2021  Forward head posture, rounded shoulders, reduced lumbar lordosis  PALPATION:   10/16/2021 Tenderness over spinous processes in upper thoracic spine, none in lower cervical spine or paraspinals  09/19/2021 General tenderness noted in Lt middle rhomboids, lower trap, infraspinatus.  Bilateral upper trap tenderness and increased tightness.  Mild tenderness Rt QL, inferior lumbar paraspinals.    Tenderness to touch anterior/lateral joint line Lt knee.   CERVICAL/ LUMBAR ROM:   Active  AROM  09/19/2021 AROM 10/09/2021 AROM 10/28/2021  Lumbar Flexion To patella To midshin   Lumbar  Extension 25 % in standing 50% in standing   Lumbar  Right lateral flexion To mid thigh c Rt lumbar pain To midthigh, painfree   Lumbar  Left lateral flexion To mid thigh c Rt lumbar pain To midthigh, painfree        Cervical flexion 30 46 50  Cervical extension 45 43 42  Cervical Lt rotation 45 59 53  Cervical Rt rotation 58 53 50   (Blank rows = not tested)  LOWER EXTREMITY ROM:      Right 09/19/2021 Left 09/19/2021  Hip flexion 95 Passive ROM in supine 95 Passive ROM in supine  Hip extension    Hip abduction    Hip adduction    Hip internal rotation    Hip external rotation 45 Passive ROM in supine 40 Passive ROM in supine  Knee flexion    Knee extension 0 AROM in seated LAQ 0 AROM in seated LAQ c pain noted  Ankle dorsiflexion    Ankle plantarflexion    Ankle inversion    Ankle eversion     (Blank rows = not tested)  LOWER EXTREMITY MMT:     09/19/2021:  General resisted static testing in supine hooklying : bilateral hip abd and add strong and painless MMT Right 09/19/2021 Left 09/19/2021 Left 09/19/2021  Hip flexion 5/5 5/5   Hip extension     Hip abduction     Hip adduction     Hip internal rotation     Hip external rotation     Knee flexion 5/5 5/5    Knee extension 5/5 4/5 4/5  Ankle dorsiflexion     Ankle plantarflexion     Ankle inversion     Ankle eversion      (Blank rows = not tested)  LUMBAR SPECIAL TESTS:  09/19/2021  Slump test Lt:     Rt:     Spurling's test neg bil.  FUNCTIONAL TESTS:   09/19/2021:    5x Sit to stand:  18.99 seconds    18 inch chair transfer:  able to perform without s UE  GAIT: 09/19/2021 Independent ambulation c mild deviation of reduced stance phase on Lt knee, mild forward trunk lean.  Household based distances observed in clinic.     TODAY'S TREATMENT   10/28/2021: Manual: Percussive device to Rt glute max, Rt QL/paraspinal  Therex: Seated tband rows green x 15 Seated red pball squeezes 5 sec x10 Seated UE lift up into table multifidi isometric 5 sec hold x 10  Seated horizontal abduction green band x 15 Seated cervical rotation x5   10/23/21 -Seated bilat shoulder ER green x15 -Seated H abd with green X15 -Seated rows with green X15 -Seated bilat shoulder flexion 2# bar X10 -Seated lumbar flexion stretch with pball 5 sec X 10 -Seated ab set with pball isometric shoulder extension 5 sec hold X10 -Seated ab set with pball isometric hip flexion 5 sec X 10 bilat -  Seated cervical retractions X 10 bilat -Seated LAQ 2# X 10 bilat -Seated SLR X 10 bilat -Standing hamstring curl X 10 bilat -Standing hip abd X 10 bilat   PATIENT EDUCATION:  Education details: HEP, POC Person educated: Patient Education method: Explanation, Demonstration, Verbal cues, and Handouts Education comprehension: verbalized understanding, returned demonstration, and verbal cues required   HOME EXERCISE PROGRAM: Access Code: [email or web address] URL: [email or web address] Date: 10/16/2021 Prepared by: Scot Jun  Exercises - Supine Lower Trunk Rotation  - 2-3 x daily - 7 x weekly - 1 sets - 3-5 reps - 30 hold - Supine Bridge  - 1-2 x daily - 7 x weekly - 2-3 sets - 10 reps - Supine Cervical  Retraction with Towel  - 1-2 x daily - 7 x weekly - 2-3 sets - 10 reps - 5 hold - Seated Cervical Retraction  - 1-2 x daily - 7 x weekly - 3 sets - 10 reps - Standing Shoulder Row with Anchored Resistance  - 1-2 x daily - 7 x weekly - 1-2 sets - 10 reps - Seated Straight Leg Heel Taps  - 1-2 x daily - 7 x weekly - 3 sets - 10 reps - Hooklying Clamshell with Resistance  - 1-2 x daily - 7 x weekly - 1-2 sets - 10 reps - Shoulder External Rotation and Scapular Retraction with Resistance  - 2-3 x daily - 7 x weekly - 1-2 sets - 10 reps - Seated Thoracic Lumbar Extension  - 1-2 x daily - 7 x weekly - 1 sets - 10 reps  ASSESSMENT:  CLINICAL IMPRESSION:   Continued combination of neck/back, shoulder muscular activation techniques to improve recruitment and endurance for postural support.  Soft tissue treatment for lower Rt lumbar to help aid symptoms in myofascial tightness noted today.   Based off experience c traction, recommend discontinued use due to pain exacerbations.   OBJECTIVE IMPAIRMENTS Abnormal gait, decreased activity tolerance, decreased coordination, decreased endurance, decreased mobility, difficulty walking, decreased ROM, decreased strength, hypomobility, impaired perceived functional ability, impaired flexibility, impaired UE functional use, improper body mechanics, postural dysfunction, and pain.   ACTIVITY LIMITATIONS carrying, lifting, bending, sitting, standing, squatting, sleeping, stairs, transfers, bed mobility, reach over head, and locomotion level  PARTICIPATION LIMITATIONS: meal prep, cleaning, laundry, interpersonal relationship, shopping, and community activity  PERSONAL FACTORS  Multiple treatment areas, length of time since onset, surgical history, DDD lumbar, depression, DM, HTN, hypothyroidism, obesity, arthritis. lumbar fusion and cervical fusion c4-   are also affecting patient's functional outcome.   REHAB POTENTIAL: Fair to good  CLINICAL DECISION MAKING:  Evolving/moderate complexity  EVALUATION COMPLEXITY: Moderate   GOALS: Goals reviewed with patient? Yes  Short term PT Goals (target date for Short term goals are 3 weeks 10/10/2021) Patient will demonstrate independent use of home exercise program to maintain progress from in clinic treatments. Goal status: met 10/09/2021   Long term PT goals (target dates for all long term goals are 10 weeks  11/28/2021 )  1. Patient will demonstrate/report pain at worst less than or equal to 2/10 to facilitate minimal limitation in daily activity secondary to pain symptoms. Goal status: ongoing 10/16/21  2. Patient will demonstrate independent use of home exercise program to facilitate ability to maintain/progress functional gains from skilled physical therapy services. Goal status: ongoing 10/16/21  3. Patient will demonstrate FOTO knee outcome > or = 55 % to indicate reduced disability due to condition. Goal status: ongoing 10/09/21  4.  Patient will demonstrate Lt LE  MMT 5/5 throughout to facilitate ability to perform usual standing, walking, stairs at PLOF s limitation due to symptoms. Goal status: ongoing 10/09/21  5.  Patient will demonstrate lumbar AROM > or = 75% WFL to facilitate upright standing/walking posture at PLOF.    Goal status: ongoing 10/09/21  6.  Patient will demonstrate cervical AROM improvements > or = 10 degrees on average to facilitate improved cervical mobility c daily activity.   Goal status: ongoing 10/09/21  7.  Patient will demonstrate/report ability to perform sitting > 1hr, standing and walking > 30 mins each for improvement in tolerance to static postures.  a.  Goal Status: ongoing 10/09/21  8. Patient will demonstrate/report ascending/descending stairs s UE assist c reciprocal gait pattern for house entry.  a.  Goal Status: ongoing 10/09/21  PLAN: PT FREQUENCY: 1-2x/week  PT DURATION: 10 weeks  PLANNED INTERVENTIONS: Therapeutic exercises, Therapeutic activity,  Neuro Muscular re-education, Balance training, Gait training, Patient/Family education, Joint mobilization, Stair training, DME instructions, Dry Needling, Electrical stimulation, Cryotherapy, Moist heat, Taping, Ultrasound, Ionotophoresis 9m/ml Dexamethasone, and Manual therapy.  All included unless contraindicated.  PLAN FOR NEXT SESSION: Plan for reassessment for possible transitioning to HEP as patient desires.     MScot Jun PT, DPT, OCS, ATC 10/28/21  3:54 PM   PHYSICAL THERAPY DISCHARGE SUMMARY  Visits from Start of Care: 10  Current functional level related to goals / functional outcomes: See note   Remaining deficits: See note   Education / Equipment: HEP  Patient goals were partially met. Patient is being discharged due to not returning since the last visit.  MScot Jun PT, DPT, OCS, ATC 12/31/21  11:39 AM

## 2021-10-28 NOTE — Telephone Encounter (Signed)
Submitted request for gel injections to [employer] today. Uploaded last office note and imaging.

## 2021-10-30 ENCOUNTER — Encounter: Payer: 59 | Admitting: Rehabilitative and Restorative Service Providers"

## 2021-10-31 NOTE — Telephone Encounter (Signed)
Update from [employer]:  As per rep Prior Authorization is required for the code [id number]. As per rep provider has to download the PA form from website [email or web address] and it should be completely filled and fax it to the fax# along with medical records .   I downloaded the PA form and faxed to them today along with notes.

## 2021-10-31 NOTE — Telephone Encounter (Signed)
Per Baker Janus, patient's [employer] ended on 10/30/21. New plan, [place] is effective today. I put a request in to [employer] to run benefits for orthovisc/monovisc.

## 2021-11-08 NOTE — Telephone Encounter (Signed)
Per [employer] portal:   Deductible applies. Once deductible has been met, patient is responsible for a coinsurance. Once the OOP has been covered at 100%. Deductible is $6000, patient has met $0 to date.

## 2021-11-11 NOTE — Telephone Encounter (Signed)
[employer] requires PA for [employer]. PA form completed, signed and faxed to [facility].

## 2021-11-18 NOTE — Telephone Encounter (Addendum)
I called [employer] to check Monovisc PA status. Rep I spoke to states they never received faxed PA form on 11/11/21. Re- faxing form with clinicals to [phone]. Patient aware.

## 2021-11-20 ENCOUNTER — Telehealth: Payer: Self-pay | Admitting: Family Medicine

## 2021-11-20 DIAGNOSIS — M170 Bilateral primary osteoarthritis of knee: Secondary | ICD-10-CM

## 2021-11-20 NOTE — Telephone Encounter (Signed)
Patient called ask to  be referred to Dr. Denice Bors @ [facility] .  Forwarding request to med asst for review w/provider.

## 2021-11-20 NOTE — Telephone Encounter (Signed)
I called pt- she wants referral for bilateral knees. She states [facility] takes her new insurance. Referral placed and faxed to [facility].

## 2021-11-24 NOTE — Telephone Encounter (Addendum)
Received fax from [facility] is approved 11/18/21 to 11/18/2022. Junie Panning, can you confirm, she owes a (20% coinsurance)?

## 2021-11-26 DIAGNOSIS — M170 Bilateral primary osteoarthritis of knee: Secondary | ICD-10-CM | POA: Diagnosis not present

## 2021-11-26 NOTE — Telephone Encounter (Signed)
Left message for patient to call back to confirm her OOP coinsurance and verify if she wants to proceed with bilateral Monovsc injections.

## 2021-12-01 NOTE — Telephone Encounter (Signed)
Copied from [employer] portal for monovisc:  Pt has 50% coinsurance.

## 2021-12-02 NOTE — Telephone Encounter (Signed)
Left message for patient to call back to inform of below and see if patient wants to proceed with her bilateral Monovisc injections.

## 2021-12-02 NOTE — Telephone Encounter (Signed)
Pt informed of below.  She will contact her plan and call us back with her decision.

## 2021-12-12 ENCOUNTER — Ambulatory Visit: Payer: Self-pay

## 2021-12-12 ENCOUNTER — Ambulatory Visit (INDEPENDENT_AMBULATORY_CARE_PROVIDER_SITE_OTHER): Payer: 59 | Admitting: Family Medicine

## 2021-12-12 ENCOUNTER — Encounter: Payer: Self-pay | Admitting: Family Medicine

## 2021-12-12 VITALS — BP 148/83 | Ht 63.0 in | Wt 240.0 lb

## 2021-12-12 VITALS — BP 120/77 | HR 69 | Temp 98.2°F | Wt 240.2 lb

## 2021-12-12 DIAGNOSIS — E538 Deficiency of other specified B group vitamins: Secondary | ICD-10-CM

## 2021-12-12 DIAGNOSIS — R3121 Asymptomatic microscopic hematuria: Secondary | ICD-10-CM | POA: Diagnosis not present

## 2021-12-12 DIAGNOSIS — E034 Atrophy of thyroid (acquired): Secondary | ICD-10-CM

## 2021-12-12 DIAGNOSIS — R69 Illness, unspecified: Secondary | ICD-10-CM | POA: Diagnosis not present

## 2021-12-12 DIAGNOSIS — M2550 Pain in unspecified joint: Secondary | ICD-10-CM | POA: Diagnosis not present

## 2021-12-12 DIAGNOSIS — M170 Bilateral primary osteoarthritis of knee: Secondary | ICD-10-CM

## 2021-12-12 DIAGNOSIS — E559 Vitamin D deficiency, unspecified: Secondary | ICD-10-CM

## 2021-12-12 DIAGNOSIS — I480 Paroxysmal atrial fibrillation: Secondary | ICD-10-CM | POA: Diagnosis not present

## 2021-12-12 DIAGNOSIS — Z23 Encounter for immunization: Secondary | ICD-10-CM

## 2021-12-12 DIAGNOSIS — F339 Major depressive disorder, recurrent, unspecified: Secondary | ICD-10-CM

## 2021-12-12 DIAGNOSIS — R7309 Other abnormal glucose: Secondary | ICD-10-CM | POA: Diagnosis not present

## 2021-12-12 DIAGNOSIS — E785 Hyperlipidemia, unspecified: Secondary | ICD-10-CM

## 2021-12-12 DIAGNOSIS — D6869 Other thrombophilia: Secondary | ICD-10-CM

## 2021-12-12 DIAGNOSIS — I10 Essential (primary) hypertension: Secondary | ICD-10-CM

## 2021-12-12 DIAGNOSIS — M1711 Unilateral primary osteoarthritis, right knee: Secondary | ICD-10-CM | POA: Diagnosis not present

## 2021-12-12 DIAGNOSIS — M5136 Other intervertebral disc degeneration, lumbar region: Secondary | ICD-10-CM

## 2021-12-12 DIAGNOSIS — J4520 Mild intermittent asthma, uncomplicated: Secondary | ICD-10-CM

## 2021-12-12 LAB — VITAMIN B12: Vitamin B-12: 1029 pg/mL — ABNORMAL HIGH (ref 211–911)

## 2021-12-12 LAB — HEMOGLOBIN A1C: Hgb A1c MFr Bld: 6.2 % (ref 4.6–6.5)

## 2021-12-12 MED ORDER — TELMISARTAN 80 MG PO TABS: 80.0000 mg | ORAL_TABLET | Freq: Every day | ORAL | 1 refills | Status: DC

## 2021-12-12 MED ORDER — BUPROPION HCL ER (XL) 300 MG PO TB24: 300.0000 mg | ORAL_TABLET | Freq: Every day | ORAL | 1 refills | Status: DC

## 2021-12-12 MED ORDER — HYALURONAN 88 MG/4ML IX SOSY
88.0000 mg | PREFILLED_SYRINGE | Freq: Once | INTRA_ARTICULAR | Status: AC
  Administered 2021-12-12: 88 mg via INTRA_ARTICULAR

## 2021-12-12 MED ORDER — DULOXETINE HCL 60 MG PO CPEP: 60.0000 mg | ORAL_CAPSULE | Freq: Every day | ORAL | 1 refills | Status: DC

## 2021-12-12 MED ORDER — METOPROLOL SUCCINATE ER 25 MG PO TB24: 12.5000 mg | ORAL_TABLET | Freq: Every day | ORAL | 1 refills | Status: DC

## 2021-12-12 NOTE — Patient Instructions (Addendum)
Return in about 24 weeks (around 05/29/2022) for Routine chronic condition follow-up.        Great to see you today.  I have refilled the medication(s) we provide.   If labs were collected, we will inform you of lab results once received either by echart message or telephone call.   - echart message- for normal results that have been seen by the patient already.   - telephone call: abnormal results or if patient has not viewed results in their echart.

## 2021-12-12 NOTE — Patient Instructions (Signed)
Good to see you  Please use ice as needed  Please send me a message in MyChart with any questions or updates.  Please see me back in 4 weeks.   --Dr. Amarya Kuehl  

## 2021-12-12 NOTE — Progress Notes (Signed)
[name] [name] - 56 y.o. female MRN [id number]  Date of birth: November 23, 1965  SUBJECTIVE:  Including CC & ROS.  No chief complaint on file.   [name] [name] is a 56 y.o. female that is  presenting with gel injection of the right knee.    Review of Systems See HPI   HISTORY: Past Medical, Surgical, Social, and Family History Reviewed & Updated per EMR.   Pertinent Historical Findings include:  Past Medical History:  Diagnosis Date   AC joint derangement 03/26/2021   Acute bronchitis due to COVID-19 virus 09/06/2020   Anxiety 01/12/2013   Arthralgia 07/08/2016   Asthma    Atrial fibrillation ([place]) 12/06/2018   B12 deficiency 01/31/2018   Chicken pox    Chronic insomnia 11/24/2016   Clostridium difficile infection 04/02/2014, 02/06/2014   Complex tear of medial meniscus of left knee as current injury 12/01/2018   Complex tear of medial meniscus of left knee as current injury 12/01/2018   DDD (degenerative disc disease), lumbar 09/09/2018   Depression    Diabetes mellitus without complication ([place]) 63/0/1601   Dyslipidemia    Elevated hemoglobin A1c 07/08/2016   Estrogen deficiency 12/06/2018   Foraminal stenosis of lumbar region 09/09/2018   Hair loss 07/20/2017   History of lumbar fusion 09/09/2018   Hormone replacement therapy (HRT) 07/20/2017   HTN (hypertension)    Hypertension 07/08/2016   Hypokalemia 12/06/2018   Hypothyroidism    Left medial tibial plateau fracture 11/10/2018   Lumbar radiculopathy 09/09/2018   Referred to NS 09/09/2018   Migraine    Mild memory disturbance 11/13/2013   Morbid obesity ([place]) 07/08/2016   Multiple allergies    Obesity    Rash associated with COVID-19 10/02/2020   Restless legs syndrome (RLS) 01/12/2013   Follows with Dr. Jannifer Franklin who prescribes Requip and gabapentin   RLS (restless legs syndrome)    Scapular dysfunction 04/02/2021   Stress fracture of rib 04/15/2021   Vitamin D deficiency 12/06/2018   Wears glasses 07/08/2016    Past Surgical History:   Procedure Laterality Date   BREAST BIOPSY  0932,3557   x 2 benign lesion   CERVICAL DISCECTOMY  2003   CERVICAL FUSION  2010   x2   Hand fracture surgery     LUMBAR FUSION  2016   VAGINAL HYSTERECTOMY  2009     PHYSICAL EXAM:  VS: BP (!) 148/83 (BP Location: Left Arm, Patient Position: Sitting)   Ht '5\' 3"'$  (1.6 m)   Wt 240 lb (108.9 kg)   BMI 42.51 kg/m  Physical Exam Gen: NAD, alert, cooperative with exam, well-appearing MSK:  Neurovascularly intact     Aspiration/Injection Procedure Note [name] [name] November 25, 1965  Procedure: Injection Indications: Right knee pain  Procedure Details Consent: Risks of procedure as well as the alternatives and risks of each were explained to the (patient/caregiver).  Consent for procedure obtained. Time Out: Verified patient identification, verified procedure, site/side was marked, verified correct patient position, special equipment/implants available, medications/allergies/relevent history reviewed, required imaging and test results available.  Performed.  The area was cleaned with iodine and alcohol swabs.    The right knee superior lateral suprapatellar pouch was injected using 4 cc's of 1% lidocaine with a 22 2" needle.  The syringe was switched and a 4 mL of 22 mg/mL of monovisc was injected. Ultrasound was used. Images were obtained in  Long views showing the injection.    A sterile dressing was applied.  Patient did tolerate procedure  well.    ASSESSMENT & PLAN:   OA (osteoarthritis) of knee monovisc of right knee today.

## 2021-12-12 NOTE — Progress Notes (Signed)
Patient ID: [name] [name], female  DOB: 28-Dec-1965, 56 y.o.   MRN: [id number] Patient Care Team    Relationship Specialty Notifications Start End  [name] Hillock, DO PCP - General Family Medicine  07/07/16   Nahser, Wonda Cheng, MD PCP - Cardiology Cardiology Admissions 12/15/18   Ob/Gyn, [name] [name]    07/08/16   [name] Corner, MD Consulting Physician Gastroenterology  07/08/16   [name] Ducking, MD (Inactive) Consulting Physician Neurology  07/08/16   [name] Herald, MD Attending Physician Endocrinology  07/08/16    Comment: Pt established with [name] Raider, NP at this location. - Integrative med- thyroid  [name] Kief, MD Consulting Physician Rehabilitation  03/06/19    Comment: Neurosurgeon-spine and scoliosis specialist    Chief Complaint  Patient presents with   Hypertension    Pt is not fasting    Subjective: [name] [name] is a 56 y.o.  Female  present for [facility] All past medical history, surgical history, allergies, family history, immunizations, medications and social history were updated in the electronic medical record today. All recent labs, ED visits and hospitalizations within the last year were reviewed.   Elevated hemoglobin A1c Patient reports she has been able to lose weight by portion control, higher protein diet with exercise.    Hypothyroidism due to acquired atrophy of thyroid Pt reports compliance with levo 150 mcg qd on an empty stomach.    Recurrent major depressive disorder, remission status unspecified ([place]) Pt reports compliance with wellbutrin and cymbalta. She feels her condition is well controlled on these meds.    HTN/Paroxysmal atrial fibrillation (HCC)/Acquired thrombophilia (HCC)-eliquis/Morbid obesity (HCC)/dyslipidemia Pt reports compliance with telmisartan 80 mg qd and metoprolol1 2.5 mg qd Blood pressures ranges at home are normal. Patient denies chest pain, shortness of breath or lower extremity edema. Pt is prescribed eliquis.       Arthralgia, unspecified joint/DDD (degenerative disc disease), lumbar Pt reports Cymbalta has helped a great deal.   B12 deficiency/Vitamin D deficiency She is supplemenenting       07/15/2021    1:54 PM 04/29/2021   10:58 AM 03/18/2021    4:06 PM 10/02/2020    8:07 AM 01/18/2020    9:05 AM  Depression screen PHQ 2/9  Decreased Interest 1 0 0 1 0  Down, Depressed, Hopeless 1 0 0 1 0  PHQ - 2 Score 2 0 0 2 0  Altered sleeping 3  0 2   Tired, decreased energy 3  0 1   Change in appetite 3  0 1   Feeling bad or failure about yourself    0 0   Trouble concentrating 3  0 0   Moving slowly or fidgety/restless 0  0 0   Suicidal thoughts 0  0 0   PHQ-9 Score 14  0 6   Difficult doing work/chores   Not difficult at all        07/15/2021    1:54 PM 10/02/2020    8:07 AM  GAD 7 : Generalized Anxiety Score  Nervous, Anxious, on Edge 2 3  Control/stop worrying 1 0  Worry too much - different things 1 0  Trouble relaxing 3 3  Restless 2 1  Easily annoyed or irritable 1 2  Afraid - awful might happen 0 0  Total GAD 7 Score 10 9     Immunization History  Administered Date(s) Administered   Influenza,inj,Quad PF,6+ Mos 01/25/2018, 12/05/2019, 12/12/2021   Influenza,trivalent, recombinat, inj, PF 12/04/2016  Influenza-Unspecified 01/25/2018, 12/05/2019   Tdap 07/15/2021   Zoster Recombinat (Shingrix) 07/15/2021, 12/12/2021    Past Medical History:  Diagnosis Date   AC joint derangement 03/26/2021   Acute bronchitis due to COVID-19 virus 09/06/2020   Anxiety 01/12/2013   Arthralgia 07/08/2016   Asthma    Atrial fibrillation ([place]) 12/06/2018   B12 deficiency 01/31/2018   Chicken pox    Chronic insomnia 11/24/2016   Clostridium difficile infection 04/02/2014, 02/06/2014   Complex tear of medial meniscus of left knee as current injury 12/01/2018   Complex tear of medial meniscus of left knee as current injury 12/01/2018   DDD (degenerative disc disease), lumbar 09/09/2018    Depression    Diabetes mellitus without complication ([place]) 69/05/8544   Dyslipidemia    Elevated hemoglobin A1c 07/08/2016   Estrogen deficiency 12/06/2018   Foraminal stenosis of lumbar region 09/09/2018   Hair loss 07/20/2017   History of lumbar fusion 09/09/2018   Hormone replacement therapy (HRT) 07/20/2017   HTN (hypertension)    Hypertension 07/08/2016   Hypokalemia 12/06/2018   Hypothyroidism    Left medial tibial plateau fracture 11/10/2018   Lumbar radiculopathy 09/09/2018   Referred to NS 09/09/2018   Migraine    Mild memory disturbance 11/13/2013   Morbid obesity ([place]) 07/08/2016   Multiple allergies    Obesity    Rash associated with COVID-19 10/02/2020   Restless legs syndrome (RLS) 01/12/2013   Follows with Dr. Jannifer [name] who prescribes Requip and gabapentin   RLS (restless legs syndrome)    Scapular dysfunction 04/02/2021   Stress fracture of rib 04/15/2021   Vitamin D deficiency 12/06/2018   Wears glasses 07/08/2016   Allergies  Allergen Reactions   Bee Venom Swelling and Anaphylaxis   Yellow Jacket Venom Swelling   Yellow Jacket Venom Swelling   Biaxin [Clarithromycin] Diarrhea and Rash   Robaxin [Methocarbamol] Swelling    Feet, chest pain, headache   Abilify [Aripiprazole] Other (See Comments)    MOOD SWING   Buspar [Buspirone] Diarrhea   Hydrochlorothiazide Rash   Lisinopril Cough   Maxzide [Triamterene-Hctz] Rash   Phentermine Other (See Comments)    MOOD CHANGE    Quetiapine Other (See Comments)    Felt funny MOOD SWINGS   Seroquel [Quetiapine Fumarate] Other (See Comments)    MOOD SWINGS   Past Surgical History:  Procedure Laterality Date   BREAST BIOPSY  2703,5009   x 2 benign lesion   CERVICAL DISCECTOMY  2003   CERVICAL FUSION  2010   x2   Hand fracture surgery     LUMBAR FUSION  2016   VAGINAL HYSTERECTOMY  2009   Family History  Problem Relation Age of Onset   Hypertension Father    Diabetes Father    Heart disease Father    Hearing loss Father     Diabetes Brother    Liver cancer Mother    COPD Mother    Mental illness Mother    Diabetes Mother    Hearing loss Mother    Heart disease Mother    Arthritis/Rheumatoid Mother    Social History   Social History Narrative   Patient is married [name])  2 children [name] and [place])   Patient is right handed.   Patient has a college education. 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    Patient drinks 1 cups daily. Uses herbal remedies, takes a daily vitamin.   Wears her seatbelt, smoke detector in the home.    Allergies as of 12/12/2021  Reactions   Bee Venom Swelling, Anaphylaxis   Yellow Jacket Venom Swelling   Yellow Jacket Venom Swelling   Biaxin [clarithromycin] Diarrhea, Rash   Robaxin [methocarbamol] Swelling   Feet, chest pain, headache   Abilify [aripiprazole] Other (See Comments)   MOOD SWING   Buspar [buspirone] Diarrhea   Hydrochlorothiazide Rash   Lisinopril Cough   Maxzide [triamterene-hctz] Rash   Phentermine Other (See Comments)   MOOD CHANGE   Quetiapine Other (See Comments)   Felt funny MOOD SWINGS   Seroquel [quetiapine Fumarate] Other (See Comments)   MOOD SWINGS        Medication List        Accurate as of December 12, 2021 10:05 AM. If you have any questions, ask your nurse or doctor.          STOP taking these medications    acyclovir 800 MG tablet Commonly known as: ZOVIRAX Stopped by: Howard Pouch, DO   cephALEXin 500 MG capsule Commonly known as: KEFLEX Stopped by: Howard Pouch, DO   clobetasol ointment 0.05 % Commonly known as: TEMOVATE Stopped by: Howard Pouch, DO   estradiol 2 MG tablet Commonly known as: ESTRACE Stopped by: Howard Pouch, DO   HYDROcodone-acetaminophen 5-325 MG tablet Commonly known as: NORCO/VICODIN Stopped by: Howard Pouch, DO   meloxicam 7.5 MG tablet Commonly known as: MOBIC Stopped by: Howard Pouch, DO   phenazopyridine 95 MG tablet Commonly known as: PYRIDIUM Stopped by: Howard Pouch, DO    Potassium Chloride ER 20 MEQ Tbcr Stopped by: Howard Pouch, DO   progesterone 100 MG capsule Commonly known as: PROMETRIUM Stopped by: Howard Pouch, DO       TAKE these medications    albuterol 108 (90 Base) MCG/ACT inhaler Commonly known as: VENTOLIN HFA Inhale 2 puffs into the lungs every 6 (six) hours as needed for wheezing or shortness of breath.   buPROPion 300 MG 24 hr tablet Commonly known as: WELLBUTRIN XL Take 1 tablet (300 mg total) by mouth daily.   cholecalciferol 1000 units tablet Commonly known as: VITAMIN D Take 5,000 Units by mouth daily.   DULoxetine 60 MG capsule Commonly known as: CYMBALTA Take 1 capsule (60 mg total) by mouth daily.   Eliquis 5 MG Tabs tablet Generic drug: apixaban TAKE 1 TABLET BY MOUTH TWICE A DAY   gabapentin 300 MG capsule Commonly known as: NEURONTIN Take 2 capsules (600 mg total) by mouth 2 (two) times daily.   levothyroxine 150 MCG tablet Commonly known as: SYNTHROID Take 1 tablet (150 mcg total) by mouth daily.   magnesium oxide 400 MG tablet Commonly known as: MAG-OX Take 1 tablet (400 mg total) by mouth daily.   metoprolol succinate 25 MG 24 hr tablet Commonly known as: TOPROL-XL Take 0.5 tablets (12.5 mg total) by mouth daily.   rOPINIRole 2 MG tablet Commonly known as: REQUIP Take 1/2 tablet at lunch and 1/2 tablet at bed time.   rOPINIRole 4 MG 24 hr tablet Commonly known as: REQUIP XL Take 1 tablet (4 mg total) by mouth daily.   telmisartan 80 MG tablet Commonly known as: Micardis Take 1 tablet (80 mg total) by mouth daily.        All past medical history, surgical history, allergies, family history, immunizations andmedications were updated in the EMR today and reviewed under the history and medication portions of their EMR.      ROS 14 pt review of systems performed and negative (unless mentioned in an HPI)  Objective: BP  120/77   Pulse 69   Temp 98.2 F (36.8 C)   Wt 240 lb 3.2 oz (109  kg)   SpO2 95%   BMI 42.55 kg/m  Physical Exam Vitals and nursing note reviewed.  Constitutional:      General: She is not in acute distress.    Appearance: Normal appearance. She is not ill-appearing, toxic-appearing or diaphoretic.  HENT:     Head: Normocephalic and atraumatic.  Eyes:     General: No scleral icterus.       Right eye: No discharge.        Left eye: No discharge.     Extraocular Movements: Extraocular movements intact.     Conjunctiva/sclera: Conjunctivae normal.     Pupils: Pupils are equal, round, and reactive to light.  Cardiovascular:     Rate and Rhythm: Normal rate and regular rhythm.  Pulmonary:     Effort: Pulmonary effort is normal. No respiratory distress.     Breath sounds: Normal breath sounds. No wheezing, rhonchi or rales.  Musculoskeletal:     Cervical back: Neck supple. No tenderness.     Right lower leg: No edema.     Left lower leg: No edema.  Lymphadenopathy:     Cervical: No cervical adenopathy.  Skin:    General: Skin is warm and dry.     Coloration: Skin is not jaundiced or pale.     Findings: No erythema or rash.  Neurological:     Mental Status: She is alert and oriented to person, place, and time. Mental status is at baseline.     Motor: No weakness.     Gait: Gait normal.  Psychiatric:        Mood and Affect: Mood normal.        Behavior: Behavior normal.        Thought Content: Thought content normal.        Judgment: Judgment normal.      No results found.  Assessment/plan: [name] [name] is a 56 y.o. female present for [facility] Hypothyroidism/hair loss stable Continue  levothyroxine 150 mcg QD.  -Follow-up 5.5 months   Recurrent major depressive disorder, remission status unspecified (HCC) stable Continue Cymbalta 60 mg daily  Continue Wellbutrin 300 mg daily    Arthralgia, unspecified joint stable -Requip and horizontal are prescribed by her neurology team. - continue Cymbalta  Vitamin D deficiency: -Patient is  taking 5000 units daily.   -Continue current supplementation - vit d collected due next visit   Essential hypertension/morbid obesity/PAF/BMI 45.0-49.9, adult (HCC)/Acquired thrombophilia (HCC)-eliquis stable Continue Micardis 80 mg  Continue   metoprolol 12.5 mg daily Continue  Eliquis prescribed by cardiology Continue  to follow with cardiology Low sodium.    B12 deficiency - Vitamin B12- added today to labs Elevated hemoglobin A1c - a1c collected today- she has lost over 20 lbs!!!  Asymptomatic microscopic hematuria Repeat today to ensure resolution.  - Urinalysis w microscopic + reflex cultur  Influenza vaccine needed - Flu Vaccine QUAD 81moIM (Fluarix, Fluzone & Alfiuria Quad P Need for vaccination for zoster - Varicella-zoster vaccine IM  Return in about 24 weeks (around 05/29/2022) for Routine chronic condition follow-up.    Orders Placed This Encounter  Procedures   Varicella-zoster vaccine IM   Flu Vaccine QUAD 662moM (Fluarix, Fluzone & Alfiuria Quad PF)   Urinalysis w microscopic + reflex cultur   CBC w/Diff   Hemoglobin A1c   B12   Meds ordered this encounter  Medications  buPROPion (WELLBUTRIN XL) 300 MG 24 hr tablet    Sig: Take 1 tablet (300 mg total) by mouth daily.    Dispense:  90 tablet    Refill:  1   DULoxetine (CYMBALTA) 60 MG capsule    Sig: Take 1 capsule (60 mg total) by mouth daily.    Dispense:  90 capsule    Refill:  1   metoprolol succinate (TOPROL-XL) 25 MG 24 hr tablet    Sig: Take 0.5 tablets (12.5 mg total) by mouth daily.    Dispense:  45 tablet    Refill:  1   telmisartan (MICARDIS) 80 MG tablet    Sig: Take 1 tablet (80 mg total) by mouth daily.    Dispense:  90 tablet    Refill:  1   Referral Orders  No referral(s) requested today     Electronically signed by: Howard Pouch, [place]

## 2021-12-12 NOTE — Assessment & Plan Note (Signed)
monovisc of right knee today.

## 2021-12-13 LAB — URINALYSIS W MICROSCOPIC + REFLEX CULTURE
Bacteria, UA: NONE SEEN /HPF
Bilirubin Urine: NEGATIVE
Glucose, UA: NEGATIVE
Hgb urine dipstick: NEGATIVE
Hyaline Cast: NONE SEEN /LPF
Ketones, ur: NEGATIVE
Leukocyte Esterase: NEGATIVE
Nitrites, Initial: NEGATIVE
Protein, ur: NEGATIVE
RBC / HPF: NONE SEEN /HPF (ref 0–2)
Specific Gravity, Urine: 1.026 (ref 1.001–1.035)
WBC, UA: NONE SEEN /HPF (ref 0–5)
pH: 5.5 (ref 5.0–8.0)

## 2021-12-13 LAB — NO CULTURE INDICATED

## 2021-12-15 LAB — CBC WITH DIFFERENTIAL/PLATELET
Basophils Absolute: 0.1 10*3/uL (ref 0.0–0.1)
Basophils Relative: 0.7 % (ref 0.0–3.0)
Eosinophils Absolute: 0.2 10*3/uL (ref 0.0–0.7)
Eosinophils Relative: 2.6 % (ref 0.0–5.0)
HCT: 48.8 % — ABNORMAL HIGH (ref 36.0–46.0)
Hemoglobin: 15.4 g/dL — ABNORMAL HIGH (ref 12.0–15.0)
Lymphocytes Relative: 25.3 % (ref 12.0–46.0)
Lymphs Abs: 2.1 10*3/uL (ref 0.7–4.0)
MCHC: 31.6 g/dL (ref 30.0–36.0)
MCV: 95.5 fl (ref 78.0–100.0)
Monocytes Absolute: 0.5 10*3/uL (ref 0.1–1.0)
Monocytes Relative: 6.7 % (ref 3.0–12.0)
Neutro Abs: 5.2 10*3/uL (ref 1.4–7.7)
Neutrophils Relative %: 64.5 % (ref 43.0–77.0)
Platelets: 310 10*3/uL (ref 150.0–400.0)
RBC: 5.11 Mil/uL (ref 3.87–5.11)
RDW: 12.3 % (ref 11.5–15.5)
WBC: 8.1 10*3/uL (ref 4.0–10.5)

## 2021-12-16 ENCOUNTER — Telehealth: Payer: Self-pay | Admitting: Family Medicine

## 2021-12-16 NOTE — Telephone Encounter (Signed)
Please call patient Blood cell counts are normal with the exception of mildly elevated hemoglobin and hematocrit.  These are not concerning and themselves, I would recommend she work on hydration. A1c was just mildly elevated at 6.2.  She has been doing great with her diet and weight loss.  I encouraged her to continue and we will see this decrease over time.

## 2021-12-18 ENCOUNTER — Encounter: Payer: Self-pay | Admitting: Family Medicine

## 2021-12-18 NOTE — Telephone Encounter (Signed)
Spoke with patient regarding results/recommendations.  

## 2021-12-26 DIAGNOSIS — M170 Bilateral primary osteoarthritis of knee: Secondary | ICD-10-CM | POA: Diagnosis not present

## 2022-01-28 DIAGNOSIS — M170 Bilateral primary osteoarthritis of knee: Secondary | ICD-10-CM | POA: Diagnosis not present

## 2022-02-04 ENCOUNTER — Telehealth: Payer: Self-pay

## 2022-02-04 NOTE — Telephone Encounter (Signed)
   Pre-operative Risk Assessment    Patient Name: [name] [name]  DOB: 09/05/1965 MRN: [id number]      Request for Surgical Clearance    Procedure:   left knee arthroplasty  Date of Surgery:  Clearance 03/31/22                                 Surgeon:  Dr. Melrose Nakayama Surgeon's Group or Practice Name:  [facility] Phone number:  [phone] Fax number:  [phone]   Type of Clearance Requested:   - Medical    Type of Anesthesia:  General    Additional requests/questions:    SignedLowella Grip   02/04/2022, 1:46 PM

## 2022-02-04 NOTE — Telephone Encounter (Signed)
Surgical clearance forms received on 02/04/22. Patient has been scheduled on n/a to surgical clearance appt. Patient was seen on 12/12/21. Forms have been placed on PCP desk for review and completion.

## 2022-02-04 NOTE — Telephone Encounter (Signed)
Patient with diagnosis of afib on Eliquis for anticoagulation.    Procedure: left knee arthroplasty Date of procedure: 03/31/22  CHA2DS2-VASc Score = 3  This indicates a 3.2% annual risk of stroke. The patient's score is based upon: CHF History: 0 HTN History: 1 Diabetes History: 1 Stroke History: 0 Vascular Disease History: 0 Age Score: 0 Gender Score: 1  Questionable TIA in 2021, not on PMH. Follows with neuro who have only commented on her RLS.  CrCl >129m/min Platelet count 310K  Per office protocol, patient can hold Eliquis for 3 days prior to procedure.    **This guidance is not considered finalized until pre-operative APP has relayed final recommendations.**

## 2022-02-04 NOTE — Telephone Encounter (Signed)
   Name: [name] [name]  DOB: March 29, 1965  MRN: [id number]  Primary Cardiologist: Mertie Moores, MD  Chart reviewed as part of pre-operative protocol coverage. Because of [name]'s past medical history and time since last visit, she will require a follow-up in-office visit in order to better assess preoperative cardiovascular risk.  Pre-op covering staff: - Please schedule appointment and call patient to inform them. If patient already had an upcoming appointment within acceptable timeframe, please add "pre-op clearance" to the appointment notes so provider is aware. - Please contact requesting surgeon's office via preferred method (i.e, phone, fax) to inform them of need for appointment prior to surgery.  This message will also be routed to pharmacy pool for input on holding Eliquis as requested below so that this information is available to the clearing provider at time of patient's appointment.   [name], NP  02/04/2022, 2:18 PM

## 2022-02-05 NOTE — Telephone Encounter (Signed)
S/w pt made appt will need appt for surgical clearance on Dec 11.

## 2022-02-05 NOTE — Telephone Encounter (Signed)
Faxed

## 2022-02-05 NOTE — Telephone Encounter (Signed)
completed

## 2022-02-09 ENCOUNTER — Encounter: Payer: Self-pay | Admitting: Nurse Practitioner

## 2022-02-09 ENCOUNTER — Ambulatory Visit: Payer: 59 | Attending: Nurse Practitioner | Admitting: Nurse Practitioner

## 2022-02-09 VITALS — BP 136/72 | HR 69 | Ht 63.0 in | Wt 222.4 lb

## 2022-02-09 DIAGNOSIS — I10 Essential (primary) hypertension: Secondary | ICD-10-CM | POA: Diagnosis not present

## 2022-02-09 DIAGNOSIS — R0683 Snoring: Secondary | ICD-10-CM

## 2022-02-09 DIAGNOSIS — E785 Hyperlipidemia, unspecified: Secondary | ICD-10-CM

## 2022-02-09 DIAGNOSIS — Z01810 Encounter for preprocedural cardiovascular examination: Secondary | ICD-10-CM | POA: Diagnosis not present

## 2022-02-09 DIAGNOSIS — I480 Paroxysmal atrial fibrillation: Secondary | ICD-10-CM | POA: Diagnosis not present

## 2022-02-09 NOTE — Patient Instructions (Signed)
Medication Instructions:  Your physician recommends that you continue on your current medications as directed. Please refer to the Current Medication list given to you today.   *If you need a refill on your cardiac medications before your next appointment, please call your pharmacy*   Lab Work: NONE ordered at this time of appointment   If you have labs (blood work) drawn today and your tests are completely normal, you will receive your results only by: [place] (if you have MyChart) OR A paper copy in the mail If you have any lab test that is abnormal or we need to change your treatment, we will call you to review the results.   Testing/Procedures: NONE ordered at this time of appointment     Follow-Up: At [facility], you and your health needs are our priority.  As part of our continuing mission to provide you with exceptional heart care, we have created designated Provider Care Teams.  These Care Teams include your primary Cardiologist (physician) and Advanced Practice Providers (APPs -  Physician Assistants and Nurse Practitioners) who all work together to provide you with the care you need, when you need it.  We recommend signing up for the patient portal called "MyChart".  Sign up information is provided on this After Visit Summary.  MyChart is used to connect with patients for Virtual Visits (Telemedicine).  Patients are able to view lab/test results, encounter notes, upcoming appointments, etc.  Non-urgent messages can be sent to your provider as well.   To learn more about what you can do with MyChart, go to [email or web address].    Your next appointment:   1 year(s)  The format for your next appointment:   In Person  Provider:   Jenne Campus, MD    Other Instructions   Important Information About Sugar

## 2022-02-09 NOTE — Progress Notes (Signed)
Office Visit    Patient Name: [name] [name] Date of Encounter: 02/09/2022  Primary Care Provider:  [name], DO Primary Cardiologist:  Jenne Campus, MD  Chief Complaint    56 year old female with a history of paroxysmal atrial fibrillation, hypertension, hyperlipidemia, hypothyroidism, and obesity who presents for follow-up related to atrial fibrillation and for preoperative cardiac evaluation.  Past Medical History    Past Medical History:  Diagnosis Date   AC joint derangement 03/26/2021   Acute bronchitis due to COVID-19 virus 09/06/2020   Anxiety 01/12/2013   Arthralgia 07/08/2016   Asthma    Atrial fibrillation ([place]) 12/06/2018   B12 deficiency 01/31/2018   Chicken pox    Chronic insomnia 11/24/2016   Clostridium difficile infection 04/02/2014, 02/06/2014   Complex tear of medial meniscus of left knee as current injury 12/01/2018   Complex tear of medial meniscus of left knee as current injury 12/01/2018   DDD (degenerative disc disease), lumbar 09/09/2018   Depression    Diabetes mellitus without complication ([place]) 56/05/1495   Dyslipidemia    Elevated hemoglobin A1c 07/08/2016   Estrogen deficiency 12/06/2018   Foraminal stenosis of lumbar region 09/09/2018   Hair loss 07/20/2017   History of lumbar fusion 09/09/2018   Hormone replacement therapy (HRT) 07/20/2017   HTN (hypertension)    Hypertension 07/08/2016   Hypokalemia 12/06/2018   Hypothyroidism    Left medial tibial plateau fracture 11/10/2018   Lumbar radiculopathy 09/09/2018   Referred to NS 09/09/2018   Migraine    Mild memory disturbance 11/13/2013   Morbid obesity ([place]) 07/08/2016   Multiple allergies    Obesity    Rash associated with COVID-19 10/02/2020   Restless legs syndrome (RLS) 01/12/2013   Follows with Dr. Jannifer Franklin who prescribes Requip and gabapentin   RLS (restless legs syndrome)    Scapular dysfunction 04/02/2021   Stress fracture of rib 04/15/2021   Vitamin D deficiency 12/06/2018   Wears glasses  07/08/2016   Past Surgical History:  Procedure Laterality Date   BREAST BIOPSY  0263,7858   x 2 benign lesion   CERVICAL DISCECTOMY  2003   CERVICAL FUSION  2010   x2   Hand fracture surgery     LUMBAR FUSION  2016   VAGINAL HYSTERECTOMY  2009    Allergies  Allergies  Allergen Reactions   Bee Venom Swelling and Anaphylaxis   Yellow Jacket Venom Swelling   Yellow Jacket Venom Swelling   Biaxin [Clarithromycin] Diarrhea and Rash   Robaxin [Methocarbamol] Swelling    Feet, chest pain, headache   Abilify [Aripiprazole] Other (See Comments)    MOOD SWING   Buspar [Buspirone] Diarrhea   Hydrochlorothiazide Rash   Lisinopril Cough   Maxzide [Triamterene-Hctz] Rash   Phentermine Other (See Comments)    MOOD CHANGE    Quetiapine Other (See Comments)    Felt funny MOOD SWINGS   Seroquel [Quetiapine Fumarate] Other (See Comments)    MOOD SWINGS    History of Present Illness    56 year old female with the above past medical history including paroxysmal atrial fibrillation, hypertension, hyperlipidemia, hypothyroidism, and obesity.  She has 1 documented episode of paroxysmal atrial fibrillation and has been anticoagulated since that time.  Recent echocardiogram in 03/2021 in the setting of dyspnea on exertion showed EF 60 to 65%, normal LV function, no RWMA, normal RV systolic function, no significant valvular abnormalities, no significant change from prior echo.  BNP was normal.  She was last seen in the office  on 04/24/2021 and was stable overall from a cardiac standpoint.  Her BP was somewhat elevated.  Possible addition of spironolactone was mentioned, however, this was never initiated. Outpatient sleep study was ordered but was never completed.  She presents today for follow-up and for preoperative cardiac exam for upcoming left knee arthroplasty on 03/31/2022 with Dr. Melrose Nakayama of [facility] [place]. Since her last visit she has done well from a  cardiac standpoint.  She states that her shortness of breath was due to a drug reaction from Robaxin.  Once she stopped the medication, her symptoms completely resolved.  She has lost 45 pounds in the past year with diet and exercise.  Overall, she reports feeling well.  Home Medications    Current Outpatient Medications  Medication Sig Dispense Refill   albuterol (VENTOLIN HFA) 108 (90 Base) MCG/ACT inhaler Inhale 2 puffs into the lungs every 6 (six) hours as needed for wheezing or shortness of breath. 6.7 each 1   apixaban (ELIQUIS) 5 MG TABS tablet TAKE 1 TABLET BY MOUTH TWICE A DAY 60 tablet 11   buPROPion (WELLBUTRIN XL) 300 MG 24 hr tablet Take 1 tablet (300 mg total) by mouth daily. 90 tablet 1   cholecalciferol (VITAMIN D) 1000 units tablet Take 5,000 Units by mouth daily.      DULoxetine (CYMBALTA) 60 MG capsule Take 1 capsule (60 mg total) by mouth daily. 90 capsule 1   gabapentin (NEURONTIN) 300 MG capsule Take 2 capsules (600 mg total) by mouth 2 (two) times daily. 360 capsule 3   levothyroxine (SYNTHROID) 150 MCG tablet Take 1 tablet (150 mcg total) by mouth daily. 90 tablet 3   magnesium oxide (MAG-OX) 400 MG tablet Take 1 tablet (400 mg total) by mouth daily. 90 tablet 3   metoprolol succinate (TOPROL-XL) 25 MG 24 hr tablet Take 0.5 tablets (12.5 mg total) by mouth daily. 45 tablet 1   rOPINIRole (REQUIP XL) 4 MG 24 hr tablet Take 1 tablet (4 mg total) by mouth daily. 90 tablet 3   rOPINIRole (REQUIP) 2 MG tablet Take 1/2 tablet at lunch and 1/2 tablet at bed time. 90 tablet 3   telmisartan (MICARDIS) 80 MG tablet Take 1 tablet (80 mg total) by mouth daily. 90 tablet 1   No current facility-administered medications for this visit.     Review of Systems    She denies chest pain, palpitations, dyspnea, pnd, orthopnea, n, v, dizziness, syncope, edema, weight gain, or early satiety. All other systems reviewed and are otherwise negative except as noted above.   Physical Exam     VS:  BP 136/72   Pulse 69   Ht '5\' 3"'$  (1.6 m)   Wt 222 lb 6.4 oz (100.9 kg)   SpO2 96%   BMI 39.40 kg/m  GEN: Well nourished, well developed, in no acute distress. HEENT: normal. Neck: Supple, no JVD, carotid bruits, or masses. Cardiac: RRR, no murmurs, rubs, or gallops. No clubbing, cyanosis, edema.  Radials/DP/PT 2+ and equal bilaterally.  Respiratory:  Respirations regular and unlabored, clear to auscultation bilaterally. GI: Soft, nontender, nondistended, BS + x 4. MS: no deformity or atrophy. Skin: warm and dry, no rash. Neuro:  Strength and sensation are intact. Psych: Normal affect.  Accessory Clinical Findings    ECG personally reviewed by me today -sinus rhythm, 69 bpm, PACs- no acute changes.   Lab Results  Component Value Date   WBC 8.1 12/12/2021   HGB 15.4 (H) 12/12/2021  HCT 48.8 (H) 12/12/2021   MCV 95.5 12/12/2021   PLT 310.0 12/12/2021   Lab Results  Component Value Date   CREATININE 0.53 (L) 04/24/2021   BUN 14 04/24/2021   NA 141 04/24/2021   K 3.8 04/24/2021   CL 103 04/24/2021   CO2 27 04/24/2021   Lab Results  Component Value Date   ALT 21 12/06/2018   AST 11 12/06/2018   ALKPHOS 75 12/06/2018   BILITOT 0.4 12/06/2018   Lab Results  Component Value Date   CHOL 198 07/15/2021   HDL 68 07/15/2021   LDLCALC 108 (H) 07/15/2021   TRIG 111 07/15/2021   CHOLHDL 2.9 07/15/2021    Lab Results  Component Value Date   HGBA1C 6.2 12/12/2021    Assessment & Plan    1. Paroxysmal atrial fibrillation: Maintaining NSR. Continue metoprolol, Eliquis.  2. Hypertension: BP well controlled. Continue current antihypertensive regimen.   3. Hyperlipidemia: LDL was 108 in 06/2021.  She is not on statin therapy.  4. H/o snoring: Declined sleep study.  Stable.  5. Preoperative cardiac exam: According to the Revised Cardiac Risk Index (RCRI), her Perioperative Risk of Major Cardiac Event is (%): 0.4. Her Functional Capacity in METs is: 8.97 according to  the Duke Activity Status Index (DASI). Therefore, based on ACC/AHA guidelines, patient would be at acceptable risk for the planned procedure without further cardiovascular testing. Per office protocol, patient can hold Eliquis for 3 days prior to procedure.  Please resume Eliquis as soon as possible postprocedure, the discretion of the surgeon.  I will route this recommendation to the requesting party via Epic fax function.   6. Disposition: Follow-up in 1 year.       [name], NP 02/09/2022, 11:54 AM

## 2022-02-12 DIAGNOSIS — M25662 Stiffness of left knee, not elsewhere classified: Secondary | ICD-10-CM | POA: Diagnosis not present

## 2022-02-12 DIAGNOSIS — R262 Difficulty in walking, not elsewhere classified: Secondary | ICD-10-CM | POA: Diagnosis not present

## 2022-02-12 DIAGNOSIS — M1732 Unilateral post-traumatic osteoarthritis, left knee: Secondary | ICD-10-CM | POA: Diagnosis not present

## 2022-02-26 ENCOUNTER — Other Ambulatory Visit: Payer: Self-pay | Admitting: Orthopaedic Surgery

## 2022-03-09 ENCOUNTER — Ambulatory Visit
Admission: RE | Admit: 2022-03-09 | Discharge: 2022-03-09 | Disposition: A | Discharge status: Home / Self Care | Payer: 59 | Source: Ambulatory Visit | Attending: Family Medicine | Admitting: Family Medicine

## 2022-03-09 DIAGNOSIS — Z1231 Encounter for screening mammogram for malignant neoplasm of breast: Secondary | ICD-10-CM | POA: Diagnosis not present

## 2022-03-13 ENCOUNTER — Encounter: Payer: Self-pay | Admitting: Family Medicine

## 2022-03-13 ENCOUNTER — Telehealth (INDEPENDENT_AMBULATORY_CARE_PROVIDER_SITE_OTHER): Payer: 59 | Admitting: Family Medicine

## 2022-03-13 VITALS — Temp 101.2°F

## 2022-03-13 DIAGNOSIS — R197 Diarrhea, unspecified: Secondary | ICD-10-CM

## 2022-03-13 DIAGNOSIS — K2900 Acute gastritis without bleeding: Secondary | ICD-10-CM | POA: Diagnosis not present

## 2022-03-13 MED ORDER — ONDANSETRON 4 MG PO TBDP: 4.0000 mg | ORAL_TABLET | Freq: Three times a day (TID) | ORAL | 0 refills | Status: DC | PRN

## 2022-03-13 MED ORDER — PROMETHAZINE HCL 25 MG RE SUPP: 25.0000 mg | Freq: Four times a day (QID) | RECTAL | 0 refills | Status: DC | PRN

## 2022-03-13 MED ORDER — OMEPRAZOLE 40 MG PO CPDR: 40.0000 mg | DELAYED_RELEASE_CAPSULE | Freq: Every day | ORAL | 0 refills | Status: DC

## 2022-03-13 MED ORDER — SUCRALFATE 1 G PO TABS: 1.0000 g | ORAL_TABLET | Freq: Three times a day (TID) | ORAL | 0 refills | Status: DC

## 2022-03-13 MED ORDER — DICYCLOMINE HCL 20 MG PO TABS: 20.0000 mg | ORAL_TABLET | Freq: Four times a day (QID) | ORAL | 0 refills | Status: DC

## 2022-03-13 NOTE — Progress Notes (Signed)
VIRTUAL VISIT VIA VIDEO  I connected with [name] [name] on 03/13/22 at  4:00 PM EST by a video enabled telemedicine application and verified that I am speaking with the correct person using two identifiers. Location patient: Home Location provider: [facility], Office Persons participating in the virtual visit: Patient, Dr. Raoul Pitch and Cyndra Numbers, CMA  I discussed the limitations of evaluation and management by telemedicine and the availability of in person appointments. The patient expressed understanding and agreed to proceed.   [name] [name] , [birth year], 57 y.o., female MRN: [id number] Patient Care Team    Relationship Specialty Notifications Start End  [name], DO PCP - General Family Medicine  07/07/16   Park Liter, MD PCP - Cardiology Cardiology  02/09/22   Ob/Gyn, [name]    07/08/16   Wilford Corner, MD Consulting Physician Gastroenterology  07/08/16   [name], MD (Inactive) Consulting Physician Neurology  07/08/16   [name], MD Attending Physician Endocrinology  07/08/16    Comment: Pt established with Si Raider, NP at this location. - Integrative med- thyroid  [name], MD Consulting Physician Rehabilitation  03/06/19    Comment: Neurosurgeon-spine and scoliosis specialist    Chief Complaint  Patient presents with   Diarrhea    Since Monday, chills fever vomiting, no covid test     Subjective: Pt presents for an OV with complaints of nausea, vomiting, diarrhea and chills going on the fifth day.  [name] [name] states [name] [name] recently visited [facility] and believes [name] [name] picked up the virus at that time.  [name] [name] has not really been able to keep anything on her stomach.  When [name] [name] does keep some fluids on her stomach [name] [name] reports [name] [name] will then have diarrhea that is watery.  [name] [name] has up to 8 watery BMs a day.  [name] [name] has been taking Tylenol for the fevers.  [name] [name] has been drinking Gatorade, water and Sprite to help keep her hydrated.  [name] [name] reports  [name] [name] is lost 8 pounds since Monday.     07/15/2021    1:54 PM 04/29/2021   10:58 AM 03/18/2021    4:06 PM 10/02/2020    8:07 AM 01/18/2020    9:05 AM  Depression screen PHQ 2/9  Decreased Interest 1 0 0 1 0  Down, Depressed, Hopeless 1 0 0 1 0  PHQ - 2 Score 2 0 0 2 0  Altered sleeping 3  0 2   Tired, decreased energy 3  0 1   Change in appetite 3  0 1   Feeling bad or failure about yourself    0 0   Trouble concentrating 3  0 0   Moving slowly or fidgety/restless 0  0 0   Suicidal thoughts 0  0 0   PHQ-9 Score 14  0 6   Difficult doing work/chores   Not difficult at all      Allergies  Allergen Reactions   Bee Venom Swelling and Anaphylaxis   Yellow Jacket Venom Swelling   Yellow Jacket Venom Swelling   Biaxin [Clarithromycin] Diarrhea and Rash   Robaxin [Methocarbamol] Swelling    Feet, chest pain, headache   Abilify [Aripiprazole] Other (See Comments)    MOOD SWING   Buspar [Buspirone] Diarrhea   Hydrochlorothiazide Rash   Lisinopril Cough   Maxzide [Triamterene-Hctz] Rash   Phentermine Other (See Comments)    MOOD CHANGE    Quetiapine Other (See Comments)    Felt  funny MOOD SWINGS   Seroquel [Quetiapine Fumarate] Other (See Comments)    MOOD SWINGS   Social History   Social History Narrative   Patient is married [name] [name])  2 children [name] [name] and [name] [name])   Patient is right handed.   Patient has a college education. 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    Patient drinks 1 cups daily. Uses herbal remedies, takes a daily vitamin.   Wears her seatbelt, smoke detector in the home.   Past Medical History:  Diagnosis Date   AC joint derangement 03/26/2021   Acute bronchitis due to COVID-19 virus 09/06/2020   Anxiety 01/12/2013   Arthralgia 07/08/2016   Asthma    Atrial fibrillation ([place]) 12/06/2018   B12 deficiency 01/31/2018   Chicken pox    Chronic insomnia 11/24/2016   Clostridium difficile infection 04/02/2014, 02/06/2014   Complex tear of medial meniscus of left knee as  current injury 12/01/2018   Complex tear of medial meniscus of left knee as current injury 12/01/2018   DDD (degenerative disc disease), lumbar 09/09/2018   Depression    Diabetes mellitus without complication ([place]) 72/07/3662   Dyslipidemia    Elevated hemoglobin A1c 07/08/2016   Estrogen deficiency 12/06/2018   Foraminal stenosis of lumbar region 09/09/2018   Hair loss 07/20/2017   History of lumbar fusion 09/09/2018   Hormone replacement therapy (HRT) 07/20/2017   HTN (hypertension)    Hypertension 07/08/2016   Hypokalemia 12/06/2018   Hypothyroidism    Left medial tibial plateau fracture 11/10/2018   Lumbar radiculopathy 09/09/2018   Referred to NS 09/09/2018   Migraine    Mild memory disturbance 11/13/2013   Morbid obesity ([place]) 07/08/2016   Multiple allergies    Obesity    Rash associated with COVID-19 10/02/2020   Restless legs syndrome (RLS) 01/12/2013   Follows with Dr. Jannifer Franklin who prescribes Requip and gabapentin   RLS (restless legs syndrome)    Scapular dysfunction 04/02/2021   Stress fracture of rib 04/15/2021   Vitamin D deficiency 12/06/2018   Wears glasses 07/08/2016   Past Surgical History:  Procedure Laterality Date   BREAST BIOPSY  4034,7425   x 2 benign lesion   CERVICAL DISCECTOMY  2003   CERVICAL FUSION  2010   x2   Hand fracture surgery     LUMBAR FUSION  2016   VAGINAL HYSTERECTOMY  2009   Family History  Problem Relation Age of Onset   Hypertension Father    Diabetes Father    Heart disease Father    Hearing loss Father    Diabetes Brother    Liver cancer Mother    COPD Mother    Mental illness Mother    Diabetes Mother    Hearing loss Mother    Heart disease Mother    Arthritis/Rheumatoid Mother    Allergies as of 03/13/2022       Reactions   Bee Venom Swelling, Anaphylaxis   Yellow Jacket Venom Swelling   Yellow Jacket Venom Swelling   Biaxin [clarithromycin] Diarrhea, Rash   Robaxin [methocarbamol] Swelling   Feet, chest pain, headache   Abilify  [aripiprazole] Other (See Comments)   MOOD SWING   Buspar [buspirone] Diarrhea   Hydrochlorothiazide Rash   Lisinopril Cough   Maxzide [triamterene-hctz] Rash   Phentermine Other (See Comments)   MOOD CHANGE   Quetiapine Other (See Comments)   Felt funny MOOD SWINGS   Seroquel [quetiapine Fumarate] Other (See Comments)   MOOD SWINGS  Medication List        Accurate as of March 13, 2022  4:16 PM. If you have any questions, ask your nurse or doctor.          albuterol 108 (90 Base) MCG/ACT inhaler Commonly known as: VENTOLIN HFA Inhale 2 puffs into the lungs every 6 (six) hours as needed for wheezing or shortness of breath.   buPROPion 300 MG 24 hr tablet Commonly known as: WELLBUTRIN XL Take 1 tablet (300 mg total) by mouth daily.   cholecalciferol 1000 units tablet Commonly known as: VITAMIN D Take 5,000 Units by mouth daily.   dicyclomine 20 MG tablet Commonly known as: BENTYL Take 1 tablet (20 mg total) by mouth every 6 (six) hours. Started by: Howard Pouch, DO   DULoxetine 60 MG capsule Commonly known as: CYMBALTA Take 1 capsule (60 mg total) by mouth daily.   Eliquis 5 MG Tabs tablet Generic drug: apixaban TAKE 1 TABLET BY MOUTH TWICE A DAY   gabapentin 300 MG capsule Commonly known as: NEURONTIN Take 2 capsules (600 mg total) by mouth 2 (two) times daily.   levothyroxine 150 MCG tablet Commonly known as: SYNTHROID Take 1 tablet (150 mcg total) by mouth daily.   magnesium oxide 400 MG tablet Commonly known as: MAG-OX Take 1 tablet (400 mg total) by mouth daily.   metoprolol succinate 25 MG 24 hr tablet Commonly known as: TOPROL-XL Take 0.5 tablets (12.5 mg total) by mouth daily.   omeprazole 40 MG capsule Commonly known as: PRILOSEC Take 1 capsule (40 mg total) by mouth daily. Started by: Howard Pouch, DO   ondansetron 4 MG disintegrating tablet Commonly known as: ZOFRAN-ODT Take 1 tablet (4 mg total) by mouth every 8 (eight)  hours as needed for nausea or vomiting. Started by: Howard Pouch, DO   promethazine 25 MG suppository Commonly known as: PHENERGAN Place 1 suppository (25 mg total) rectally every 6 (six) hours as needed for nausea or vomiting. Started by: Howard Pouch, DO   rOPINIRole 2 MG tablet Commonly known as: REQUIP Take 1/2 tablet at lunch and 1/2 tablet at bed time.   rOPINIRole 4 MG 24 hr tablet Commonly known as: REQUIP XL Take 1 tablet (4 mg total) by mouth daily.   sucralfate 1 g tablet Commonly known as: Carafate Take 1 tablet (1 g total) by mouth 4 (four) times daily -  with meals and at bedtime. Started by: Howard Pouch, DO   telmisartan 80 MG tablet Commonly known as: Micardis Take 1 tablet (80 mg total) by mouth daily.        All past medical history, surgical history, allergies, family history, immunizations andmedications were updated in the EMR today and reviewed under the history and medication portions of their EMR.     ROS Negative, with the exception of above mentioned in HPI   Objective:  Temp (!) 101.2 F (38.4 C)  There is no height or weight on file to calculate BMI.  Physical Exam Vitals and nursing note reviewed.  Constitutional:      General: [name] [name] is not in acute distress.    Appearance: Normal appearance. [name] [name] is not toxic-appearing.  HENT:     Head: Normocephalic and atraumatic.  Eyes:     General: No scleral icterus.       Right eye: No discharge.        Left eye: No discharge.     Conjunctiva/sclera: Conjunctivae normal.  Pulmonary:     Effort: Pulmonary effort is  normal.  Musculoskeletal:     Cervical back: Normal range of motion.  Skin:    Findings: No rash.  Neurological:     Mental Status: [name] [name] is alert and oriented to person, place, and time. Mental status is at baseline.  Psychiatric:        Mood and Affect: Mood normal.        Behavior: Behavior normal.        Thought Content: Thought content normal.        Judgment: Judgment  normal.      No results found. No results found. No results found for this or any previous visit (from the past 24 hour(s)).  Assessment/Plan: KATJA BLUE is a 57 y.o. female present for OV for  Acute gastritis without hemorrhage, unspecified gastritis type/Diarrhea, unspecified type Rest, continue to push fluids. Start Zofran every 8 hours scheduled for at least 2-3 days then as needed. Start Carafate and Bentyl every 6 hours-scheduled for the first 2 to 3 days Start omeprazole daily x 14 days Lastly, prescribed Phenergan suppositories if needed. If unable to hydrate well or tolerate p.o. on the above regimen, would encourage her to seek urgent care and receive IV fluids  Reviewed expectations re: course of current medical issues. Discussed self-management of symptoms. Outlined signs and symptoms indicating need for more acute intervention. Patient verbalized understanding and all questions were answered. Patient received an After-Visit Summary.    No orders of the defined types were placed in this encounter.  Meds ordered this encounter  Medications   dicyclomine (BENTYL) 20 MG tablet    Sig: Take 1 tablet (20 mg total) by mouth every 6 (six) hours.    Dispense:  40 tablet    Refill:  0   sucralfate (CARAFATE) 1 g tablet    Sig: Take 1 tablet (1 g total) by mouth 4 (four) times daily -  with meals and at bedtime.    Dispense:  120 tablet    Refill:  0   omeprazole (PRILOSEC) 40 MG capsule    Sig: Take 1 capsule (40 mg total) by mouth daily.    Dispense:  14 capsule    Refill:  0   promethazine (PHENERGAN) 25 MG suppository    Sig: Place 1 suppository (25 mg total) rectally every 6 (six) hours as needed for nausea or vomiting.    Dispense:  12 each    Refill:  0   ondansetron (ZOFRAN-ODT) 4 MG disintegrating tablet    Sig: Take 1 tablet (4 mg total) by mouth every 8 (eight) hours as needed for nausea or vomiting.    Dispense:  30 tablet    Refill:  0   Referral  Orders  No referral(s) requested today     Note is dictated utilizing voice recognition software. Although note has been proof read prior to signing, occasional typographical errors still can be missed. If any questions arise, please do not hesitate to call for verification.   electronically signed by:  Howard Pouch, DO  [place]

## 2022-03-17 ENCOUNTER — Encounter: Payer: Self-pay | Admitting: Family Medicine

## 2022-03-18 ENCOUNTER — Ambulatory Visit (INDEPENDENT_AMBULATORY_CARE_PROVIDER_SITE_OTHER): Payer: 59 | Admitting: Family Medicine

## 2022-03-18 ENCOUNTER — Encounter: Payer: Self-pay | Admitting: Family Medicine

## 2022-03-18 VITALS — BP 111/73 | HR 73 | Temp 98.3°F | Ht 63.0 in | Wt 214.0 lb

## 2022-03-18 DIAGNOSIS — R109 Unspecified abdominal pain: Secondary | ICD-10-CM

## 2022-03-18 DIAGNOSIS — R197 Diarrhea, unspecified: Secondary | ICD-10-CM

## 2022-03-18 NOTE — Progress Notes (Signed)
[name] [name] , January 14, 1966, 57 y.o., female MRN: [id number] Patient Care Team    Relationship Specialty Notifications Start End  [name], DO PCP - General Family Medicine  07/07/16   Park Liter, MD PCP - Cardiology Cardiology  02/09/22   Ob/Gyn, [name]    07/08/16   Wilford Corner, MD Consulting Physician Gastroenterology  07/08/16   [name], MD (Inactive) Consulting Physician Neurology  07/08/16   [name], MD Attending Physician Endocrinology  07/08/16    Comment: Pt established with Si Raider, NP at this location. - Integrative med- thyroid  [name], MD Consulting Physician Rehabilitation  03/06/19    Comment: Neurosurgeon-spine and scoliosis specialist    Chief Complaint  Patient presents with   Diarrhea    Pt c/o nausea, and 6 watery BM, lower abd pain; pt reports some improvement in the few hours;    Subjective: [name] [name] is a 57 y.o. female present for persistent diarrhea of 9 days. She had started treatment for presumed GI virus 5 days ago, with PPI, zofran, bentyl. She required UC visit for IVF. Pt presented to UC dehydrated and hypotensive. Today she reports she actually is starting to feel better as of this morning.  She reports she had some loose stool earlier this morning, but has not needed to have a bowel movement since 9 AM.  She also tolerated her lunch today without vomiting or diarrhea following.  She states she still is a little nauseous.  She is tolerating fluids.  Her blood pressure has remained stable since her urgent care visit.  She has not had any recurrence of her fever.  Prior note: Pt presents for an OV with complaints of nausea, vomiting, diarrhea and chills going on the fifth day.  She states she recently visited [facility] and believes she picked up the virus at that time.  She has not really been able to keep anything on her stomach.  When she does keep some fluids on her stomach she reports she will  then have diarrhea that is watery.  She has up to 8 watery BMs a day.  She has been taking Tylenol for the fevers.  She has been drinking Gatorade, water and Sprite to help keep her hydrated.  She reports she is lost 8 pounds since Monday.     07/15/2021    1:54 PM 04/29/2021   10:58 AM 03/18/2021    4:06 PM 10/02/2020    8:07 AM 01/18/2020    9:05 AM  Depression screen PHQ 2/9  Decreased Interest 1 0 0 1 0  Down, Depressed, Hopeless 1 0 0 1 0  PHQ - 2 Score 2 0 0 2 0  Altered sleeping 3  0 2   Tired, decreased energy 3  0 1   Change in appetite 3  0 1   Feeling bad or failure about yourself    0 0   Trouble concentrating 3  0 0   Moving slowly or fidgety/restless 0  0 0   Suicidal thoughts 0  0 0   PHQ-9 Score 14  0 6   Difficult doing work/chores   Not difficult at all      Allergies  Allergen Reactions   Bee Venom Swelling and Anaphylaxis   Yellow Jacket Venom Swelling   Yellow Jacket Venom Swelling   Biaxin [Clarithromycin] Diarrhea and Rash   Robaxin [Methocarbamol] Swelling    Feet, chest pain, headache  Abilify [Aripiprazole] Other (See Comments)    MOOD SWING   Buspar [Buspirone] Diarrhea   Hydrochlorothiazide Rash   Lisinopril Cough   Maxzide [Triamterene-Hctz] Rash   Phentermine Other (See Comments)    MOOD CHANGE    Quetiapine Other (See Comments)    Felt funny MOOD SWINGS   Seroquel [Quetiapine Fumarate] Other (See Comments)    MOOD SWINGS   Social History   Social History Narrative   Patient is married [name])  2 children [name] and Mellette)   Patient is right handed.   Patient has a college education. 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    Patient drinks 1 cups daily. Uses herbal remedies, takes a daily vitamin.   Wears her seatbelt, smoke detector in the home.   Past Medical History:  Diagnosis Date   AC joint derangement 03/26/2021   Acute bronchitis due to COVID-19 virus 09/06/2020   Anxiety 01/12/2013   Arthralgia 07/08/2016   Asthma    Atrial fibrillation  ([place]) 12/06/2018   B12 deficiency 01/31/2018   Chicken pox    Chronic insomnia 11/24/2016   Clostridium difficile infection 04/02/2014, 02/06/2014   Complex tear of medial meniscus of left knee as current injury 12/01/2018   Complex tear of medial meniscus of left knee as current injury 12/01/2018   DDD (degenerative disc disease), lumbar 09/09/2018   Depression    Diabetes mellitus without complication ([place]) 78/04/9560   Dyslipidemia    Elevated hemoglobin A1c 07/08/2016   Estrogen deficiency 12/06/2018   Foraminal stenosis of lumbar region 09/09/2018   Hair loss 07/20/2017   History of lumbar fusion 09/09/2018   Hormone replacement therapy (HRT) 07/20/2017   HTN (hypertension)    Hypertension 07/08/2016   Hypokalemia 12/06/2018   Hypothyroidism    Left medial tibial plateau fracture 11/10/2018   Lumbar radiculopathy 09/09/2018   Referred to NS 09/09/2018   Migraine    Mild memory disturbance 11/13/2013   Morbid obesity ([place]) 07/08/2016   Multiple allergies    Obesity    Rash associated with COVID-19 10/02/2020   Restless legs syndrome (RLS) 01/12/2013   Follows with Dr. Jannifer Franklin who prescribes Requip and gabapentin   RLS (restless legs syndrome)    Scapular dysfunction 04/02/2021   Stress fracture of rib 04/15/2021   Vitamin D deficiency 12/06/2018   Wears glasses 07/08/2016   Past Surgical History:  Procedure Laterality Date   BREAST BIOPSY  1308,6578   x 2 benign lesion   CERVICAL DISCECTOMY  2003   CERVICAL FUSION  2010   x2   Hand fracture surgery     LUMBAR FUSION  2016   VAGINAL HYSTERECTOMY  2009   Family History  Problem Relation Age of Onset   Hypertension Father    Diabetes Father    Heart disease Father    Hearing loss Father    Diabetes Brother    Liver cancer Mother    COPD Mother    Mental illness Mother    Diabetes Mother    Hearing loss Mother    Heart disease Mother    Arthritis/Rheumatoid Mother    Allergies as of 03/18/2022       Reactions   Bee Venom Swelling,  Anaphylaxis   Yellow Jacket Venom Swelling   Yellow Jacket Venom Swelling   Biaxin [clarithromycin] Diarrhea, Rash   Robaxin [methocarbamol] Swelling   Feet, chest pain, headache   Abilify [aripiprazole] Other (See Comments)   MOOD SWING   Buspar [buspirone] Diarrhea   Hydrochlorothiazide Rash  Lisinopril Cough   Maxzide [triamterene-hctz] Rash   Phentermine Other (See Comments)   MOOD CHANGE   Quetiapine Other (See Comments)   Felt funny MOOD SWINGS   Seroquel [quetiapine Fumarate] Other (See Comments)   MOOD SWINGS        Medication List        Accurate as of March 18, 2022 11:59 PM. If you have any questions, ask your nurse or doctor.          albuterol 108 (90 Base) MCG/ACT inhaler Commonly known as: VENTOLIN HFA Inhale 2 puffs into the lungs every 6 (six) hours as needed for wheezing or shortness of breath.   buPROPion 300 MG 24 hr tablet Commonly known as: WELLBUTRIN XL Take 1 tablet (300 mg total) by mouth daily.   dicyclomine 20 MG tablet Commonly known as: BENTYL Take 1 tablet (20 mg total) by mouth every 6 (six) hours.   DULoxetine 60 MG capsule Commonly known as: CYMBALTA Take 1 capsule (60 mg total) by mouth daily.   Eliquis 5 MG Tabs tablet Generic drug: apixaban TAKE 1 TABLET BY MOUTH TWICE A DAY   FISH OIL PO Take 1 capsule by mouth daily.   gabapentin 300 MG capsule Commonly known as: NEURONTIN Take 2 capsules (600 mg total) by mouth 2 (two) times daily.   levothyroxine 150 MCG tablet Commonly known as: SYNTHROID Take 1 tablet (150 mcg total) by mouth daily.   magnesium oxide 400 MG tablet Commonly known as: MAG-OX Take 1 tablet (400 mg total) by mouth daily.   metoprolol succinate 25 MG 24 hr tablet Commonly known as: TOPROL-XL Take 0.5 tablets (12.5 mg total) by mouth daily.   omeprazole 40 MG capsule Commonly known as: PRILOSEC Take 1 capsule (40 mg total) by mouth daily.   ondansetron 4 MG disintegrating  tablet Commonly known as: ZOFRAN-ODT Take 1 tablet (4 mg total) by mouth every 8 (eight) hours as needed for nausea or vomiting.   promethazine 25 MG suppository Commonly known as: PHENERGAN Place 1 suppository (25 mg total) rectally every 6 (six) hours as needed for nausea or vomiting.   rOPINIRole 2 MG tablet Commonly known as: REQUIP Take 1/2 tablet at lunch and 1/2 tablet at bed time.   rOPINIRole 4 MG 24 hr tablet Commonly known as: REQUIP XL Take 1 tablet (4 mg total) by mouth daily.   sucralfate 1 g tablet Commonly known as: Carafate Take 1 tablet (1 g total) by mouth 4 (four) times daily -  with meals and at bedtime.   telmisartan 80 MG tablet Commonly known as: Micardis Take 1 tablet (80 mg total) by mouth daily.   Vitamin D 125 MCG (5000 UT) Caps Take 5,000 Units by mouth daily.        All past medical history, surgical history, allergies, family history, immunizations andmedications were updated in the EMR today and reviewed under the history and medication portions of their EMR.     ROS Negative, with the exception of above mentioned in HPI   Objective:  BP 111/73   Pulse 73   Temp 98.3 F (36.8 C) (Oral)   Ht '5\' 3"'$  (1.6 m)   Wt 214 lb (97.1 kg)   SpO2 97%   BMI 37.91 kg/m  Body mass index is 37.91 kg/m. Physical Exam Vitals and nursing note reviewed.  Constitutional:      General: She is not in acute distress.    Appearance: Normal appearance. She is not toxic-appearing.  HENT:  Head: Normocephalic and atraumatic.  Eyes:     General: No scleral icterus.       Right eye: No discharge.        Left eye: No discharge.     Conjunctiva/sclera: Conjunctivae normal.  Pulmonary:     Effort: Pulmonary effort is normal.  Musculoskeletal:     Cervical back: Normal range of motion.  Skin:    Findings: No rash.  Neurological:     Mental Status: She is alert and oriented to person, place, and time. Mental status is at baseline.  Psychiatric:         Mood and Affect: Mood normal.        Behavior: Behavior normal.        Thought Content: Thought content normal.        Judgment: Judgment normal.     No results found. No results found. Results for orders placed or performed in visit on 03/18/22 (from the past 24 hour(s))  C-reactive protein     Status: None   Collection Time: 03/18/22  3:24 PM  Result Value Ref Range   CRP 6.1 <8.0 mg/L  CBC w/Diff     Status: None   Collection Time: 03/18/22  3:24 PM  Result Value Ref Range   WBC 9.2 3.8 - 10.8 Thousand/uL   RBC 4.75 3.80 - 5.10 Million/uL   Hemoglobin 14.4 11.7 - 15.5 g/dL   HCT 41.7 35.0 - 45.0 %   MCV 87.8 80.0 - 100.0 fL   MCH 30.3 27.0 - 33.0 pg   MCHC 34.5 32.0 - 36.0 g/dL   RDW 13.0 11.0 - 15.0 %   Platelets 314 140 - 400 Thousand/uL   MPV 10.8 7.5 - 12.5 fL   Neutro Abs 4,582 1,500 - 7,800 cells/uL   Lymphs Abs 3,652 850 - 3,900 cells/uL   Absolute Monocytes 616 200 - 950 cells/uL   Eosinophils Absolute 276 15 - 500 cells/uL   Basophils Absolute 74 0 - 200 cells/uL   Neutrophils Relative % 49.8 %   Total Lymphocyte 39.7 %   Monocytes Relative 6.7 %   Eosinophils Relative 3.0 %   Basophils Relative 0.8 %   Smear Review    Comp Met (CMET)     Status: Abnormal   Collection Time: 03/18/22  3:24 PM  Result Value Ref Range   Glucose, Bld 90 65 - 99 mg/dL   BUN 15 7 - 25 mg/dL   Creat 0.66 0.50 - 1.03 mg/dL   BUN/Creatinine Ratio SEE NOTE: 6 - 22 (calc)   Sodium 140 135 - 146 mmol/L   Potassium 3.6 3.5 - 5.3 mmol/L   Chloride 107 98 - 110 mmol/L   CO2 22 20 - 32 mmol/L   Calcium 9.6 8.6 - 10.4 mg/dL   Total Protein 6.4 6.1 - 8.1 g/dL   Albumin 4.0 3.6 - 5.1 g/dL   Globulin 2.4 1.9 - 3.7 g/dL (calc)   AG Ratio 1.7 1.0 - 2.5 (calc)   Total Bilirubin 0.3 0.2 - 1.2 mg/dL   Alkaline phosphatase (APISO) 128 37 - 153 U/L   AST 24 10 - 35 U/L   ALT 54 (H) 6 - 29 U/L    Assessment/Plan: [name] [name] is a 57 y.o. female present for OV for  Acute gastritis  without hemorrhage, unspecified gastritis type/Diarrhea, unspecified type Rest, continue to push fluids. Continue Zofran as needed Continue Carafate and Bentyl every 6 hours until no diarrhea for 24 hours, then start  to taper off. Finish omeprazole daily x 14 days CBC, CMP and CRP collected today.  Patient was provided with labs.  Culture and C. difficile in the event her symptoms rebound within the next week. Hopefully, today is the turning point for her and she will continue to improve.  Reviewed expectations re: course of current medical issues. Discussed self-management of symptoms. Outlined signs and symptoms indicating need for more acute intervention. Patient verbalized understanding and all questions were answered. Patient received an After-Visit Summary.    Orders Placed This Encounter  Procedures   Stool culture   Clostridium Difficile by PCR([facility])   C-reactive protein   CBC w/Diff   Comp Met (CMET)   No orders of the defined types were placed in this encounter.  Referral Orders  No referral(s) requested today     Note is dictated utilizing voice recognition software. Although note has been proof read prior to signing, occasional typographical errors still can be missed. If any questions arise, please do not hesitate to call for verification.   electronically signed by:  Howard Pouch, DO  [place]

## 2022-03-18 NOTE — Patient Instructions (Signed)
No follow-ups on file.        Great to see you today.   If labs were collected, we will inform you of lab results once received either by echart message or telephone call.   - echart message- for normal results that have been seen by the patient already.   - telephone call: abnormal results or if patient has not viewed results in their echart.    

## 2022-03-19 LAB — CBC WITH DIFFERENTIAL/PLATELET
Absolute Monocytes: 616 cells/uL (ref 200–950)
Basophils Absolute: 74 cells/uL (ref 0–200)
Basophils Relative: 0.8 %
Eosinophils Absolute: 276 cells/uL (ref 15–500)
Eosinophils Relative: 3 %
HCT: 41.7 % (ref 35.0–45.0)
Hemoglobin: 14.4 g/dL (ref 11.7–15.5)
Lymphs Abs: 3652 cells/uL (ref 850–3900)
MCH: 30.3 pg (ref 27.0–33.0)
MCHC: 34.5 g/dL (ref 32.0–36.0)
MCV: 87.8 fL (ref 80.0–100.0)
MPV: 10.8 fL (ref 7.5–12.5)
Monocytes Relative: 6.7 %
Neutro Abs: 4582 cells/uL (ref 1500–7800)
Neutrophils Relative %: 49.8 %
Platelets: 314 10*3/uL (ref 140–400)
RBC: 4.75 10*6/uL (ref 3.80–5.10)
RDW: 13 % (ref 11.0–15.0)
Total Lymphocyte: 39.7 %
WBC: 9.2 10*3/uL (ref 3.8–10.8)

## 2022-03-19 LAB — COMPREHENSIVE METABOLIC PANEL
AG Ratio: 1.7 (calc) (ref 1.0–2.5)
ALT: 54 U/L — ABNORMAL HIGH (ref 6–29)
AST: 24 U/L (ref 10–35)
Albumin: 4 g/dL (ref 3.6–5.1)
Alkaline phosphatase (APISO): 128 U/L (ref 37–153)
BUN: 15 mg/dL (ref 7–25)
CO2: 22 mmol/L (ref 20–32)
Calcium: 9.6 mg/dL (ref 8.6–10.4)
Chloride: 107 mmol/L (ref 98–110)
Creat: 0.66 mg/dL (ref 0.50–1.03)
Globulin: 2.4 g/dL (calc) (ref 1.9–3.7)
Glucose, Bld: 90 mg/dL (ref 65–99)
Potassium: 3.6 mmol/L (ref 3.5–5.3)
Sodium: 140 mmol/L (ref 135–146)
Total Bilirubin: 0.3 mg/dL (ref 0.2–1.2)
Total Protein: 6.4 g/dL (ref 6.1–8.1)

## 2022-03-19 LAB — C-REACTIVE PROTEIN: CRP: 6.1 mg/L (ref <8.0)

## 2022-03-19 NOTE — Patient Instructions (Signed)
DUE TO COVID-19 ONLY TWO VISITORS  (aged 56 and older)  ARE ALLOWED TO COME WITH YOU AND STAY IN THE WAITING ROOM ONLY DURING PRE OP AND PROCEDURE.   **NO VISITORS ARE ALLOWED IN THE SHORT STAY AREA OR RECOVERY ROOM!!**  IF YOU WILL BE ADMITTED INTO THE HOSPITAL YOU ARE ALLOWED ONLY FOUR SUPPORT PEOPLE DURING VISITATION HOURS ONLY (7 AM -8PM)   The support person(s) must pass our screening, gel in and out, and wear a mask at all times, including in the patient's room. Patients must also wear a mask when staff or their support person are in the room. Visitors GUEST BADGE MUST BE WORN VISIBLY  One adult visitor may remain with you overnight and MUST be in the room by 8 P.M.     Your procedure is scheduled on: 03/31/22   Report to [facility] Main Entrance    Report to admitting at : 7:15 AM   Call this number if you have problems the morning of surgery [phone]   Do not eat food :After Midnight.   After Midnight you may have the following liquids until : 6:45 AM DAY OF SURGERY  Water Black Coffee (sugar ok, NO MILK/CREAM OR CREAMERS)  Tea (sugar ok, NO MILK/CREAM OR CREAMERS) regular and decaf                             Plain Jell-O (NO RED)                                           Fruit ices (not with fruit pulp, NO RED)                                     Popsicles (NO RED)                                                                  Juice: apple, WHITE grape, WHITE cranberry Sports drinks like Gatorade (NO RED)                The day of surgery:  Drink ONE (1) Pre-Surgery Clear Ensure or G2 at : 6:45 AM the morning of surgery. Drink in one sitting. Do not sip.  This drink was given to you during your hospital  pre-op appointment visit. Nothing else to drink after completing the  Pre-Surgery Clear Ensure or G2.          If you have questions, please contact your surgeon's office.   Oral Hygiene is also important to reduce your risk of infection.                                     Remember - BRUSH YOUR TEETH THE MORNING OF SURGERY WITH YOUR REGULAR TOOTHPASTE  DENTURES WILL BE REMOVED PRIOR TO SURGERY PLEASE DO NOT APPLY "Poly grip" OR ADHESIVES!!!   Do NOT smoke after Midnight   Take these medicines the morning  of surgery with A SIP OF WATER: gabapentin,bupropion,duloxetine,ropinirole,metoprolol,levothyroxine.l  Bring CPAP mask and tubing day of surgery.                              You may not have any metal on your body including hair pins, jewelry, and body piercing             Do not wear make-up, lotions, powders, perfumes/cologne, or deodorant  Do not wear nail polish including gel and S&S, artificial/acrylic nails, or any other type of covering on natural nails including finger and toenails. If you have artificial nails, gel coating, etc. that needs to be removed by a nail salon please have this removed prior to surgery or surgery may need to be canceled/ delayed if the surgeon/ anesthesia feels like they are unable to be safely monitored.   Do not shave  48 hours prior to surgery.    Do not bring valuables to the hospital. [place].   Contacts, glasses, or bridgework may not be worn into surgery.   Bring small overnight bag day of surgery.   DO NOT [place]. PHARMACY WILL DISPENSE MEDICATIONS LISTED ON YOUR MEDICATION LIST TO YOU DURING YOUR ADMISSION [place]!    Patients discharged on the day of surgery will not be allowed to drive home.  Someone NEEDS to stay with you for the first 24 hours after anesthesia.   Special Instructions: Bring a copy of your healthcare power of attorney and living will documents         the day of surgery if you haven't scanned them before.              Please read over the following fact sheets you were given: IF YOU HAVE QUESTIONS ABOUT YOUR PRE-OP INSTRUCTIONS PLEASE CALL [phone]    [facility] -  Preparing for Surgery Before surgery, you can play an important role.  Because skin is not sterile, your skin needs to be as free of germs as possible.  You can reduce the number of germs on your skin by washing with CHG (chlorahexidine gluconate) soap before surgery.  CHG is an antiseptic cleaner which kills germs and bonds with the skin to continue killing germs even after washing. Please DO NOT use if you have an allergy to CHG or antibacterial soaps.  If your skin becomes reddened/irritated stop using the CHG and inform your nurse when you arrive at Short Stay. Do not shave (including legs and underarms) for at least 48 hours prior to the first CHG shower.  You may shave your face/neck. Please follow these instructions carefully:  1.  Shower with CHG Soap the night before surgery and the  morning of Surgery.  2.  If you choose to wash your hair, wash your hair first as usual with your  normal  shampoo.  3.  After you shampoo, rinse your hair and body thoroughly to remove the  shampoo.                           4.  Use CHG as you would any other liquid soap.  You can apply chg directly  to the skin and wash  Gently with a scrungie or clean washcloth.  5.  Apply the CHG Soap to your body ONLY FROM THE NECK DOWN.   Do not use on face/ open                           Wound or open sores. Avoid contact with eyes, ears mouth and genitals (private parts).                       Wash face,  Genitals (private parts) with your normal soap.             6.  Wash thoroughly, paying special attention to the area where your surgery  will be performed.  7.  Thoroughly rinse your body with warm water from the neck down.  8.  DO NOT shower/wash with your normal soap after using and rinsing off  the CHG Soap.                9.  Pat yourself dry with a clean towel.            10.  Wear clean pajamas.            11.  Place clean sheets on your bed the night of your first shower and do not  sleep  with pets. Day of Surgery : Do not apply any lotions/deodorants the morning of surgery.  Please wear clean clothes to the hospital/surgery center.  FAILURE TO FOLLOW THESE INSTRUCTIONS MAY RESULT IN THE CANCELLATION OF YOUR SURGERY PATIENT SIGNATURE_________________________________  NURSE SIGNATURE__________________________________  ________________________________________________________________________  Adam Phenix  An incentive spirometer is a tool that can help keep your lungs clear and active. This tool measures how well you are filling your lungs with each breath. Taking long deep breaths may help reverse or decrease the chance of developing breathing (pulmonary) problems (especially infection) following: A long period of time when you are unable to move or be active. BEFORE THE PROCEDURE  If the spirometer includes an indicator to show your best effort, your nurse or respiratory therapist will set it to a desired goal. If possible, sit up straight or lean slightly forward. Try not to slouch. Hold the incentive spirometer in an upright position. INSTRUCTIONS FOR USE  Sit on the edge of your bed if possible, or sit up as far as you can in bed or on a chair. Hold the incentive spirometer in an upright position. Breathe out normally. Place the mouthpiece in your mouth and seal your lips tightly around it. Breathe in slowly and as deeply as possible, raising the piston or the ball toward the top of the column. Hold your breath for 3-5 seconds or for as long as possible. Allow the piston or ball to fall to the bottom of the column. Remove the mouthpiece from your mouth and breathe out normally. Rest for a few seconds and repeat Steps 1 through 7 at least 10 times every 1-2 hours when you are awake. Take your time and take a few normal breaths between deep breaths. The spirometer may include an indicator to show your best effort. Use the indicator as a goal to work toward during  each repetition. After each set of 10 deep breaths, practice coughing to be sure your lungs are clear. If you have an incision (the cut made at the time of surgery), support your incision when coughing by placing a pillow or rolled up towels firmly  against it. Once you are able to get out of bed, walk around indoors and cough well. You may stop using the incentive spirometer when instructed by your caregiver.  RISKS AND COMPLICATIONS Take your time so you do not get dizzy or light-headed. If you are in pain, you may need to take or ask for pain medication before doing incentive spirometry. It is harder to take a deep breath if you are having pain. AFTER USE Rest and breathe slowly and easily. It can be helpful to keep track of a log of your progress. Your caregiver can provide you with a simple table to help with this. If you are using the spirometer at home, follow these instructions: [place] IF:  You are having difficultly using the spirometer. You have trouble using the spirometer as often as instructed. Your pain medication is not giving enough relief while using the spirometer. You develop fever of 100.5 F (38.1 C) or higher. SEEK IMMEDIATE MEDICAL CARE IF:  You cough up bloody sputum that had not been present before. You develop fever of 102 F (38.9 C) or greater. You develop worsening pain at or near the incision site. MAKE SURE YOU:  Understand these instructions. Will watch your condition. Will get help right away if you are not doing well or get worse. Document Released: 06/29/2006 Document Revised: 05/11/2011 Document Reviewed: 08/30/2006 [facility] Patient Information 2014 [place], [place].   ________________________________________________________________________

## 2022-03-20 ENCOUNTER — Ambulatory Visit (HOSPITAL_COMMUNITY)
Admission: RE | Admit: 2022-03-20 | Discharge: 2022-03-20 | Disposition: A | Discharge status: Home / Self Care | Payer: 59 | Source: Ambulatory Visit | Attending: Orthopaedic Surgery | Admitting: Orthopaedic Surgery

## 2022-03-20 ENCOUNTER — Encounter (HOSPITAL_COMMUNITY)
Admission: RE | Admit: 2022-03-20 | Discharge: 2022-03-20 | Disposition: A | Discharge status: Home / Self Care | Payer: 59 | Source: Ambulatory Visit | Attending: Orthopaedic Surgery | Admitting: Orthopaedic Surgery

## 2022-03-20 ENCOUNTER — Other Ambulatory Visit: Payer: Self-pay

## 2022-03-20 ENCOUNTER — Encounter (HOSPITAL_COMMUNITY): Payer: Self-pay

## 2022-03-20 VITALS — BP 167/84 | HR 66 | Temp 98.7°F | Ht 63.0 in | Wt 217.0 lb

## 2022-03-20 DIAGNOSIS — Z01818 Encounter for other preprocedural examination: Secondary | ICD-10-CM | POA: Insufficient documentation

## 2022-03-20 DIAGNOSIS — M1712 Unilateral primary osteoarthritis, left knee: Secondary | ICD-10-CM | POA: Diagnosis not present

## 2022-03-20 DIAGNOSIS — J45909 Unspecified asthma, uncomplicated: Secondary | ICD-10-CM | POA: Diagnosis not present

## 2022-03-20 DIAGNOSIS — R7309 Other abnormal glucose: Secondary | ICD-10-CM

## 2022-03-20 DIAGNOSIS — E119 Type 2 diabetes mellitus without complications: Secondary | ICD-10-CM | POA: Insufficient documentation

## 2022-03-20 DIAGNOSIS — I10 Essential (primary) hypertension: Secondary | ICD-10-CM | POA: Diagnosis not present

## 2022-03-20 DIAGNOSIS — I480 Paroxysmal atrial fibrillation: Secondary | ICD-10-CM | POA: Diagnosis not present

## 2022-03-20 HISTORY — DX: Cardiac arrhythmia, unspecified: I49.9

## 2022-03-20 HISTORY — DX: Other complications of anesthesia, initial encounter: T88.59XA

## 2022-03-20 HISTORY — DX: Nausea with vomiting, unspecified: Z98.890

## 2022-03-20 HISTORY — DX: Nausea with vomiting, unspecified: R11.2

## 2022-03-20 LAB — SURGICAL PCR SCREEN
MRSA, PCR: NEGATIVE
Staphylococcus aureus: NEGATIVE

## 2022-03-20 LAB — HEMOGLOBIN A1C
Hgb A1c MFr Bld: 5.6 % (ref 4.8–5.6)
Mean Plasma Glucose: 114.02 mg/dL

## 2022-03-20 LAB — GLUCOSE, CAPILLARY: Glucose-Capillary: 105 mg/dL — ABNORMAL HIGH (ref 70–99)

## 2022-03-20 NOTE — Progress Notes (Addendum)
For Short Stay: [place] appointment date:  Bowel Prep reminder:   For Anesthesia: PCP - DO: Renee A Kuneff. LOV: 03/18/22 Cardiologist - Dr. Jenne Campus Clearance: Diona Browner: PA-C: 02/09/22 Chest x-ray -  EKG -02/09/22  Stress Test -  ECHO - 03/26/21 Cardiac Cath -  Pacemaker/ICD device last checked: Pacemaker orders received: Device Rep notified:  Spinal Cord Stimulator:  Sleep Study -  CPAP -   Fasting Blood Sugar -  Checks Blood Sugar _____ times a day Date and result of last Hgb A1c-5.9: 12/12/21  Last dose of GLP1 agonist-  GLP1 instructions:   Last dose of SGLT-2 inhibitors-  SGLT-2 instructions:   Blood Thinner Instructions: Eliquis will be held 3 days before surgery: [name] [name]: PAC. Aspirin Instructions: Last Dose:  Activity level: Can go up a flight of stairs and activities of daily living without stopping and without chest pain and/or shortness of breath   Able to exercise without chest pain and/or shortness of breath  Anesthesia review: Hx: HTN,Afib,DIA  Patient denies shortness of breath, fever, cough and chest pain at PAT appointment   Patient verbalized understanding of instructions that were given to them at the PAT appointment. Patient was also instructed that they will need to review over the PAT instructions again at home before surgery.

## 2022-03-23 NOTE — Progress Notes (Addendum)
Anesthesia Chart Review   Case: [id number] Date/Time: 03/31/22 0936   Procedure: LEFT TOTAL KNEE ARTHROPLASTY (Left: Knee)   Anesthesia type: Spinal   Pre-op diagnosis: LEFT KNEE DEGENERATIVE JOINT DISEASE   Location: [facility] ROOM 06 / [facility]   Surgeons: Melrose Nakayama, MD       DISCUSSION:56 y.o. never smoker with h/o PONV, HTN, asthma, paroxysmal atrial fibrillation, DM II, left knee djd scheduled for above procedure 03/31/2022 with Dr. Melrose Nakayama.   Pt seen by cardiology 02/09/2022. Per OV note, "Preoperative cardiac exam: According to the Revised Cardiac Risk Index (RCRI), her Perioperative Risk of Major Cardiac Event is (%): 0.4. Her Functional Capacity in METs is: 8.97 according to the Duke Activity Status Index (DASI). Therefore, based on ACC/AHA guidelines, patient would be at acceptable risk for the planned procedure without further cardiovascular testing. Per office protocol, patient can hold Eliquis for 3 days prior to procedure.  Please resume Eliquis as soon as possible postprocedure, the discretion of the surgeon.  "  Pt reports last dose of Eliquis will be the evening dose 03/27/2022.   Anticipate pt can proceed with planned procedure barring acute status change.   VS: BP (!) 167/84   Pulse 66   Temp 37.1 C (Oral)   Ht '5\' 3"'$  (1.6 m)   Wt 98.4 kg   SpO2 99%   BMI 38.44 kg/m   PROVIDERS: [name], DO is PCP   Primary Cardiologist:  Jenne Campus, MD  LABS: Labs reviewed: Acceptable for surgery. (all labs ordered are listed, but only abnormal results are displayed)  Labs Reviewed  GLUCOSE, CAPILLARY - Abnormal; Notable for the following components:      Result Value   Glucose-Capillary 105 (*)    All other components within normal limits  SURGICAL PCR SCREEN  HEMOGLOBIN A1C     IMAGES:   EKG:   CV: Echo 03/26/2021 1. Left ventricular ejection fraction, by estimation, is 60 to 65%. The  left ventricle has normal function. The left ventricle has  no regional  wall motion abnormalities. Left ventricular diastolic parameters were  normal.   2. Right ventricular systolic function is normal. The right ventricular  size is normal.   3. The mitral valve is normal in structure. Trivial mitral valve  regurgitation.   4. The aortic valve is tricuspid. There is mild calcification of the  aortic valve. There is mild thickening of the aortic valve. Aortic valve  regurgitation is trivial.   5. The inferior vena cava is dilated in size with >50% respiratory  variability, suggesting right atrial pressure of 8 mmHg.  Past Medical History:  Diagnosis Date   AC joint derangement 03/26/2021   Acute bronchitis due to COVID-19 virus 09/06/2020   Anxiety 01/12/2013   Arthralgia 07/08/2016   Asthma    Atrial fibrillation ([place]) 12/06/2018   B12 deficiency 01/31/2018   Chicken pox    Chronic insomnia 11/24/2016   Clostridium difficile infection 04/02/2014, 02/06/2014   Complex tear of medial meniscus of left knee as current injury 12/01/2018   Complex tear of medial meniscus of left knee as current injury 42/35/3614   Complication of anesthesia    DDD (degenerative disc disease), lumbar 09/09/2018   Depression    Diabetes mellitus without complication ([place]) 43/15/4008   Dyslipidemia    Dysrhythmia    Elevated hemoglobin A1c 07/08/2016   Estrogen deficiency 12/06/2018   Foraminal stenosis of lumbar region 09/09/2018   Hair loss 07/20/2017   History of lumbar  fusion 09/09/2018   Hormone replacement therapy (HRT) 07/20/2017   HTN (hypertension)    Hypertension 07/08/2016   Hypokalemia 12/06/2018   Hypothyroidism    Left medial tibial plateau fracture 11/10/2018   Lumbar radiculopathy 09/09/2018   Referred to NS 09/09/2018   Migraine    Mild memory disturbance 11/13/2013   Morbid obesity ([place]) 07/08/2016   Multiple allergies    Obesity    PONV (postoperative nausea and vomiting)    Rash associated with COVID-19 10/02/2020   Restless legs  syndrome (RLS) 01/12/2013   Follows with Dr. Jannifer Franklin who prescribes Requip and gabapentin   RLS (restless legs syndrome)    Scapular dysfunction 04/02/2021   Stress fracture of rib 04/15/2021   Vitamin D deficiency 12/06/2018   Wears glasses 07/08/2016    Past Surgical History:  Procedure Laterality Date   BREAST BIOPSY  6553,7482   x 2 benign lesion   CERVICAL DISCECTOMY  2003   CERVICAL FUSION  2010   x2   Hand fracture surgery     LUMBAR FUSION  2016   VAGINAL HYSTERECTOMY  2009    MEDICATIONS:  albuterol (VENTOLIN HFA) 108 (90 Base) MCG/ACT inhaler   apixaban (ELIQUIS) 5 MG TABS tablet   buPROPion (WELLBUTRIN XL) 300 MG 24 hr tablet   Cholecalciferol (VITAMIN D) 125 MCG (5000 UT) CAPS   DULoxetine (CYMBALTA) 60 MG capsule   gabapentin (NEURONTIN) 300 MG capsule   levothyroxine (SYNTHROID) 150 MCG tablet   magnesium oxide (MAG-OX) 400 MG tablet   metoprolol succinate (TOPROL-XL) 25 MG 24 hr tablet   Omega-3 Fatty Acids (FISH OIL PO)   rOPINIRole (REQUIP XL) 4 MG 24 hr tablet   rOPINIRole (REQUIP) 2 MG tablet   telmisartan (MICARDIS) 80 MG tablet   No current facility-administered medications for this encounter.    Konrad Felix Ward, PA-C [facility] [phone]

## 2022-03-27 NOTE — Anesthesia Preprocedure Evaluation (Signed)
Anesthesia Evaluation  Patient identified by MRN, date of birth, ID band Patient awake    Reviewed: Allergy & Precautions, NPO status , Patient's Chart, lab work & pertinent test results  History of Anesthesia Complications (+) PONV and history of anesthetic complications  Airway Mallampati: II  TM Distance: >3 FB Neck ROM: Full    Dental no notable dental hx. (+) Implants, Dental Advisory Given, Teeth Intact   Pulmonary asthma    Pulmonary exam normal breath sounds clear to auscultation       Cardiovascular hypertension, Normal cardiovascular exam+ dysrhythmias Atrial Fibrillation  Rhythm:Regular Rate:Normal  Echo 03/26/2021 1. Left ventricular ejection fraction, by estimation, is 60 to 65%. The  left ventricle has normal function. The left ventricle has no regional  wall motion abnormalities. Left ventricular diastolic parameters were  normal.   2. Right ventricular systolic function is normal. The right ventricular  size is normal.   3. The mitral valve is normal in structure. Trivial mitral valve  regurgitation.   4. The aortic valve is tricuspid. There is mild calcification of the  aortic valve. There is mild thickening of the aortic valve. Aortic valve  regurgitation is trivial.   5. The inferior vena cava is dilated in size with >50% respiratory  variability, suggesting right atrial pressure of 8 mmHg.     Neuro/Psych  PSYCHIATRIC DISORDERS Anxiety Depression       GI/Hepatic negative GI ROS, Neg liver ROS,,,  Endo/Other  diabetes    Renal/GU Lab Results      Component                Value               Date                      CREATININE               0.66                03/18/2022                     K                        3.6                 03/18/2022                     Musculoskeletal  (+) Arthritis ,    Abdominal   Peds  Hematology Lab Results      Component                Value                Date                            HGB                      14.4                03/18/2022                     PLT                      314  03/18/2022              Anesthesia Other Findings All: See list  Reproductive/Obstetrics                              Anesthesia Physical Anesthesia Plan  ASA: 3  Anesthesia Plan: Regional and Spinal   Post-op Pain Management: Regional block* and Minimal or no pain anticipated   Induction:   PONV Risk Score and Plan: Treatment may vary due to age or medical condition and Propofol infusion  Airway Management Planned: Natural Airway and Nasal Cannula  Additional Equipment: None  Intra-op Plan:   Post-operative Plan:   Informed Consent: I have reviewed the patients History and Physical, chart, labs and discussed the procedure including the risks, benefits and alternatives for the proposed anesthesia with the patient or authorized representative who has indicated his/her understanding and acceptance.     Dental advisory given  Plan Discussed with: CRNA, Anesthesiologist and Surgeon  Anesthesia Plan Comments: (See PAT note 03/20/2022  L Adductor canal)        Anesthesia Quick Evaluation

## 2022-03-30 ENCOUNTER — Telehealth: Payer: Self-pay | Admitting: Family Medicine

## 2022-03-30 NOTE — Telephone Encounter (Signed)
Spoke with and informed her that our office is booked for the day. Pt decline to see another [facility] provider.

## 2022-03-30 NOTE — Telephone Encounter (Signed)
Please advise 

## 2022-03-30 NOTE — Telephone Encounter (Signed)
I am booked, but she can be schedule with any provider in [place] or go to [facility]

## 2022-03-30 NOTE — Telephone Encounter (Signed)
Pt calls and reports she is getting total knee replacement tomorrow. She is asking to be seen today, which I informed her we has no appointments. She mentions that her below her diaphragm hurts and has been since last night. She wants to make sure this is not an issue before her surgery tomorrow.

## 2022-03-30 NOTE — H&P (Signed)
TOTAL KNEE ADMISSION H&P   Patient is being admitted for left total knee arthroplasty.  Subjective:  Chief Complaint:left knee pain.  HPI: [name] [name], 57 y.o. female, has a history of pain and functional disability in the left knee due to arthritis and has failed non-surgical conservative treatments for greater than 12 weeks to includeNSAID's and/or analgesics, corticosteriod injections, flexibility and strengthening excercises, use of assistive devices, weight reduction as appropriate, and activity modification.  Onset of symptoms was gradual, starting 5 years ago with gradually worsening course since that time. The patient noted no past surgery on the left knee(s).  Patient currently rates pain in the left knee(s) at 10 out of 10 with activity. Patient has night pain, worsening of pain with activity and weight bearing, pain that interferes with activities of daily living, crepitus, and joint swelling.  Patient has evidence of subchondral cysts, subchondral sclerosis, periarticular osteophytes, and joint space narrowing by imaging studies. There is no active infection.  Patient Active Problem List   Diagnosis Date Noted   OA (osteoarthritis) of knee 08/05/2021   Cervical fusion syndrome 07/09/2021   Chronic pain syndrome 07/09/2021   Myofascial pain syndrome of thoracic spine 05/29/2021   Cervical radiculopathy 05/29/2021   Asthma 04/21/2021   Neuropathy 02/18/2021   Multiple allergies 10/02/2020   Acquired thrombophilia (HCC)-eliquis 12/20/2019   Atrial fibrillation ([place]) 12/06/2018   Vitamin D deficiency 12/06/2018   DDD (degenerative disc disease), lumbar 09/09/2018   Foraminal stenosis of lumbar region 09/09/2018   History of lumbar fusion 09/09/2018   Lumbar radiculopathy 09/09/2018   B12 deficiency 01/31/2018   Elevated hemoglobin A1c 07/08/2016   Hypertension 07/08/2016   Arthralgia 07/08/2016   Morbid obesity ([place]) 07/08/2016   Hypothyroidism    Dyslipidemia     Depression    Restless legs syndrome (RLS) 01/12/2013   Past Medical History:  Diagnosis Date   AC joint derangement 03/26/2021   Acute bronchitis due to COVID-19 virus 09/06/2020   Anxiety 01/12/2013   Arthralgia 07/08/2016   Asthma    Atrial fibrillation ([place]) 12/06/2018   B12 deficiency 01/31/2018   Chicken pox    Chronic insomnia 11/24/2016   Clostridium difficile infection 04/02/2014, 02/06/2014   Complex tear of medial meniscus of left knee as current injury 12/01/2018   Complex tear of medial meniscus of left knee as current injury 99/24/2683   Complication of anesthesia    DDD (degenerative disc disease), lumbar 09/09/2018   Depression    Diabetes mellitus without complication ([place]) 41/96/2229   Dyslipidemia    Dysrhythmia    Elevated hemoglobin A1c 07/08/2016   Estrogen deficiency 12/06/2018   Foraminal stenosis of lumbar region 09/09/2018   Hair loss 07/20/2017   History of lumbar fusion 09/09/2018   Hormone replacement therapy (HRT) 07/20/2017   HTN (hypertension)    Hypertension 07/08/2016   Hypokalemia 12/06/2018   Hypothyroidism    Left medial tibial plateau fracture 11/10/2018   Lumbar radiculopathy 09/09/2018   Referred to NS 09/09/2018   Migraine    Mild memory disturbance 11/13/2013   Morbid obesity ([place]) 07/08/2016   Multiple allergies    Obesity    PONV (postoperative nausea and vomiting)    Rash associated with COVID-19 10/02/2020   Restless legs syndrome (RLS) 01/12/2013   Follows with Dr. Jannifer Franklin who prescribes Requip and gabapentin   RLS (restless legs syndrome)    Scapular dysfunction 04/02/2021   Stress fracture of rib 04/15/2021   Vitamin D deficiency 12/06/2018   Wears glasses  07/08/2016    Past Surgical History:  Procedure Laterality Date   BREAST BIOPSY  5701,7793   x 2 benign lesion   CERVICAL DISCECTOMY  2003   CERVICAL FUSION  2010   x2   Hand fracture surgery     LUMBAR FUSION  2016   VAGINAL HYSTERECTOMY  2009    No current  facility-administered medications for this encounter.   Current Outpatient Medications  Medication Sig Dispense Refill Last Dose   albuterol (VENTOLIN HFA) 108 (90 Base) MCG/ACT inhaler Inhale 2 puffs into the lungs every 6 (six) hours as needed for wheezing or shortness of breath. 6.7 each 1    apixaban (ELIQUIS) 5 MG TABS tablet TAKE 1 TABLET BY MOUTH TWICE A DAY 60 tablet 11    buPROPion (WELLBUTRIN XL) 300 MG 24 hr tablet Take 1 tablet (300 mg total) by mouth daily. 90 tablet 1    Cholecalciferol (VITAMIN D) 125 MCG (5000 UT) CAPS Take 5,000 Units by mouth daily.      DULoxetine (CYMBALTA) 60 MG capsule Take 1 capsule (60 mg total) by mouth daily. 90 capsule 1    gabapentin (NEURONTIN) 300 MG capsule Take 2 capsules (600 mg total) by mouth 2 (two) times daily. 360 capsule 3    levothyroxine (SYNTHROID) 150 MCG tablet Take 1 tablet (150 mcg total) by mouth daily. 90 tablet 3    magnesium oxide (MAG-OX) 400 MG tablet Take 1 tablet (400 mg total) by mouth daily. 90 tablet 3    metoprolol succinate (TOPROL-XL) 25 MG 24 hr tablet Take 0.5 tablets (12.5 mg total) by mouth daily. 45 tablet 1    Omega-3 Fatty Acids (FISH OIL PO) Take 1 capsule by mouth daily.      rOPINIRole (REQUIP XL) 4 MG 24 hr tablet Take 1 tablet (4 mg total) by mouth daily. 90 tablet 3    rOPINIRole (REQUIP) 2 MG tablet Take 1/2 tablet at lunch and 1/2 tablet at bed time. 90 tablet 3    telmisartan (MICARDIS) 80 MG tablet Take 1 tablet (80 mg total) by mouth daily. 90 tablet 1    Allergies  Allergen Reactions   Bee Venom Swelling and Anaphylaxis   Yellow Jacket Venom Swelling   Biaxin [Clarithromycin] Diarrhea and Rash   Robaxin [Methocarbamol] Swelling    Feet, chest pain, headache   Abilify [Aripiprazole] Other (See Comments)    MOOD SWING   Buspar [Buspirone] Diarrhea   Hydrochlorothiazide Rash   Lisinopril Cough   Maxzide [Triamterene-Hctz] Rash   Phentermine Other (See Comments)    MOOD CHANGE    Quetiapine  Other (See Comments)    Felt funny MOOD SWINGS   Seroquel [Quetiapine Fumarate] Other (See Comments)    MOOD SWINGS    Social History   Tobacco Use   Smoking status: Never    Passive exposure: Never   Smokeless tobacco: Never  Substance Use Topics   Alcohol use: No    Alcohol/week: 0.0 standard drinks of alcohol    Family History  Problem Relation Age of Onset   Hypertension Father    Diabetes Father    Heart disease Father    Hearing loss Father    Diabetes Brother    Liver cancer Mother    COPD Mother    Mental illness Mother    Diabetes Mother    Hearing loss Mother    Heart disease Mother    Arthritis/Rheumatoid Mother      Review of Systems  Musculoskeletal:  Positive for arthralgias.       Left knee  All other systems reviewed and are negative.   Objective:  Physical Exam Constitutional:      Appearance: Normal appearance.  HENT:     Head: Normocephalic and atraumatic.     Mouth/Throat:     Pharynx: Oropharynx is clear.  Eyes:     Extraocular Movements: Extraocular movements intact.  Pulmonary:     Effort: Pulmonary effort is normal.  Abdominal:     Palpations: Abdomen is soft.  Musculoskeletal:     Cervical back: Normal range of motion.     Comments: Examination of bilateral knee shows range of motion from just shy of full extension on the left to 120 of flexion.  Right knee has full range of motion.  She has tenderness palpation along her joint lines.  Mild crepitation bilaterally.  No effusion bilaterally.  Ligaments are stable.  Both calves are soft and nontender.  She is neurovascularly intact distally bilaterally.   Skin:    General: Skin is warm and dry.  Neurological:     General: No focal deficit present.     Mental Status: She is alert and oriented to person, place, and time. Mental status is at baseline.  Psychiatric:        Mood and Affect: Mood normal.        Behavior: Behavior normal.        Thought Content: Thought content normal.         Judgment: Judgment normal.     Vital signs in last 24 hours:    Labs:   Estimated body mass index is 38.44 kg/m as calculated from the following:   Height as of 03/20/22: '5\' 3"'$  (1.6 m).   Weight as of 03/20/22: 98.4 kg.   Imaging Review Plain radiographs demonstrate severe degenerative joint disease of the left knee(s). The overall alignment isneutral. The bone quality appears to be good for age and reported activity level.      Assessment/Plan:  End stage primary arthritis, left knee   The patient history, physical examination, clinical judgment of the provider and imaging studies are consistent with end stage degenerative joint disease of the left knee(s) and total knee arthroplasty is deemed medically necessary. The treatment options including medical management, injection therapy arthroscopy and arthroplasty were discussed at length. The risks and benefits of total knee arthroplasty were presented and reviewed. The risks due to aseptic loosening, infection, stiffness, patella tracking problems, thromboembolic complications and other imponderables were discussed. The patient acknowledged the explanation, agreed to proceed with the plan and consent was signed. Patient is being admitted for inpatient treatment for surgery, pain control, PT, OT, prophylactic antibiotics, VTE prophylaxis, progressive ambulation and ADL's and discharge planning. The patient is planning to be discharged home with home health services   Patient's anticipated LOS is less than 2 midnights, meeting these requirements: - Younger than 66 - Lives within 1 hour of care - Has a competent adult at home to recover with post-op recover - NO history of  - Chronic pain requiring opiods  - Diabetes  - Coronary Artery Disease  - Heart failure  - Heart attack  - Stroke  - DVT/VTE  - Cardiac arrhythmia  - Respiratory Failure/COPD  - Renal failure  - Anemia  - Advanced Liver disease

## 2022-03-31 ENCOUNTER — Ambulatory Visit (HOSPITAL_COMMUNITY): Payer: 59 | Admitting: Physician Assistant

## 2022-03-31 ENCOUNTER — Other Ambulatory Visit: Payer: Self-pay

## 2022-03-31 ENCOUNTER — Ambulatory Visit (HOSPITAL_BASED_OUTPATIENT_CLINIC_OR_DEPARTMENT_OTHER): Payer: 59 | Admitting: Certified Registered Nurse Anesthetist

## 2022-03-31 ENCOUNTER — Ambulatory Visit (HOSPITAL_COMMUNITY)
Admission: RE | Admit: 2022-03-31 | Discharge: 2022-03-31 | Disposition: A | Discharge status: Home / Self Care | Payer: 59 | Source: Ambulatory Visit | Attending: Orthopaedic Surgery | Admitting: Orthopaedic Surgery

## 2022-03-31 ENCOUNTER — Encounter (HOSPITAL_COMMUNITY): Payer: Self-pay | Admitting: Orthopaedic Surgery

## 2022-03-31 ENCOUNTER — Encounter (HOSPITAL_COMMUNITY)
Admission: RE | Disposition: A | Discharge status: Home / Self Care | Payer: Self-pay | Source: Ambulatory Visit | Attending: Orthopaedic Surgery

## 2022-03-31 DIAGNOSIS — J45909 Unspecified asthma, uncomplicated: Secondary | ICD-10-CM

## 2022-03-31 DIAGNOSIS — I4891 Unspecified atrial fibrillation: Secondary | ICD-10-CM | POA: Diagnosis not present

## 2022-03-31 DIAGNOSIS — E119 Type 2 diabetes mellitus without complications: Secondary | ICD-10-CM | POA: Diagnosis not present

## 2022-03-31 DIAGNOSIS — I10 Essential (primary) hypertension: Secondary | ICD-10-CM | POA: Diagnosis not present

## 2022-03-31 DIAGNOSIS — M1712 Unilateral primary osteoarthritis, left knee: Secondary | ICD-10-CM | POA: Diagnosis not present

## 2022-03-31 DIAGNOSIS — F32A Depression, unspecified: Secondary | ICD-10-CM | POA: Insufficient documentation

## 2022-03-31 DIAGNOSIS — E1140 Type 2 diabetes mellitus with diabetic neuropathy, unspecified: Secondary | ICD-10-CM | POA: Diagnosis not present

## 2022-03-31 DIAGNOSIS — F419 Anxiety disorder, unspecified: Secondary | ICD-10-CM | POA: Insufficient documentation

## 2022-03-31 DIAGNOSIS — J4489 Other specified chronic obstructive pulmonary disease: Secondary | ICD-10-CM | POA: Diagnosis not present

## 2022-03-31 DIAGNOSIS — R69 Illness, unspecified: Secondary | ICD-10-CM | POA: Diagnosis not present

## 2022-03-31 DIAGNOSIS — Z96652 Presence of left artificial knee joint: Secondary | ICD-10-CM | POA: Diagnosis not present

## 2022-03-31 DIAGNOSIS — G8918 Other acute postprocedural pain: Secondary | ICD-10-CM | POA: Diagnosis not present

## 2022-03-31 HISTORY — PX: TOTAL KNEE ARTHROPLASTY: SHX125

## 2022-03-31 LAB — GLUCOSE, CAPILLARY
Glucose-Capillary: 120 mg/dL — ABNORMAL HIGH (ref 70–99)
Glucose-Capillary: 113 mg/dL — ABNORMAL HIGH (ref 70–99)

## 2022-03-31 SURGERY — ARTHROPLASTY, KNEE, TOTAL
Anesthesia: Regional | Site: Knee | Laterality: Left

## 2022-03-31 MED ORDER — METOCLOPRAMIDE HCL 5 MG PO TABS: 5.0000 mg | ORAL_TABLET | Freq: Three times a day (TID) | ORAL | Status: DC | PRN

## 2022-03-31 MED ORDER — BUPIVACAINE LIPOSOME 1.3 % IJ SUSP
INTRAMUSCULAR | Status: AC
  Filled 2022-03-31: qty 20

## 2022-03-31 MED ORDER — ROPIVACAINE HCL 5 MG/ML IJ SOLN
INTRAMUSCULAR | Status: DC | PRN
  Administered 2022-03-31: 30 mL via PERINEURAL

## 2022-03-31 MED ORDER — ONDANSETRON HCL 4 MG/2ML IJ SOLN
INTRAMUSCULAR | Status: AC
  Filled 2022-03-31: qty 2

## 2022-03-31 MED ORDER — FENTANYL CITRATE (PF) 100 MCG/2ML IJ SOLN
INTRAMUSCULAR | Status: AC
  Filled 2022-03-31: qty 2

## 2022-03-31 MED ORDER — ACETAMINOPHEN 325 MG PO TABS: 325.0000 mg | ORAL_TABLET | Freq: Four times a day (QID) | ORAL | Status: DC | PRN

## 2022-03-31 MED ORDER — OXYCODONE HCL 5 MG/5ML PO SOLN: 5.0000 mg | Freq: Once | ORAL | Status: DC | PRN

## 2022-03-31 MED ORDER — STERILE WATER FOR IRRIGATION IR SOLN
Status: DC | PRN
  Administered 2022-03-31 (×2): 1000 mL

## 2022-03-31 MED ORDER — TRANEXAMIC ACID-NACL 1000-0.7 MG/100ML-% IV SOLN
1000.0000 mg | INTRAVENOUS | Status: AC
  Administered 2022-03-31: 1000 mg via INTRAVENOUS
  Filled 2022-03-31: qty 100

## 2022-03-31 MED ORDER — SODIUM CHLORIDE (PF) 0.9 % IJ SOLN
INTRAMUSCULAR | Status: AC
  Filled 2022-03-31: qty 30

## 2022-03-31 MED ORDER — ONDANSETRON HCL 4 MG/2ML IJ SOLN
INTRAMUSCULAR | Status: DC | PRN
  Administered 2022-03-31: 4 mg via INTRAVENOUS

## 2022-03-31 MED ORDER — ONDANSETRON HCL 4 MG/2ML IJ SOLN: 4.0000 mg | Freq: Once | INTRAMUSCULAR | Status: DC | PRN

## 2022-03-31 MED ORDER — PROPOFOL 10 MG/ML IV BOLUS
INTRAVENOUS | Status: DC | PRN
  Administered 2022-03-31 (×2): 30 mg via INTRAVENOUS
  Administered 2022-03-31: 150 mg via INTRAVENOUS

## 2022-03-31 MED ORDER — ACETAMINOPHEN 10 MG/ML IV SOLN
INTRAVENOUS | Status: DC | PRN
  Administered 2022-03-31: 1000 mg via INTRAVENOUS

## 2022-03-31 MED ORDER — CHLORHEXIDINE GLUCONATE 0.12 % MT SOLN
15.0000 mL | Freq: Once | OROMUCOSAL | Status: AC
  Administered 2022-03-31: 15 mL via OROMUCOSAL

## 2022-03-31 MED ORDER — PROPOFOL 1000 MG/100ML IV EMUL
INTRAVENOUS | Status: AC
  Filled 2022-03-31: qty 100

## 2022-03-31 MED ORDER — TIZANIDINE HCL 4 MG PO TABS: 4.0000 mg | ORAL_TABLET | Freq: Four times a day (QID) | ORAL | 1 refills | Status: DC | PRN

## 2022-03-31 MED ORDER — FENTANYL CITRATE (PF) 100 MCG/2ML IJ SOLN
INTRAMUSCULAR | Status: DC | PRN
  Administered 2022-03-31 (×4): 50 ug via INTRAVENOUS
  Administered 2022-03-31: 100 ug via INTRAVENOUS

## 2022-03-31 MED ORDER — AMISULPRIDE (ANTIEMETIC) 5 MG/2ML IV SOLN: 10.0000 mg | Freq: Once | INTRAVENOUS | Status: DC | PRN

## 2022-03-31 MED ORDER — DEXAMETHASONE SODIUM PHOSPHATE 10 MG/ML IJ SOLN
INTRAMUSCULAR | Status: DC | PRN
  Administered 2022-03-31: 4 mg via INTRAVENOUS

## 2022-03-31 MED ORDER — MIDAZOLAM HCL 5 MG/5ML IJ SOLN
INTRAMUSCULAR | Status: DC | PRN
  Administered 2022-03-31: 2 mg via INTRAVENOUS

## 2022-03-31 MED ORDER — LACTATED RINGERS IV BOLUS
250.0000 mL | Freq: Once | INTRAVENOUS | Status: AC
  Administered 2022-03-31: 250 mL via INTRAVENOUS

## 2022-03-31 MED ORDER — ACETAMINOPHEN 10 MG/ML IV SOLN
INTRAVENOUS | Status: AC
  Filled 2022-03-31: qty 100

## 2022-03-31 MED ORDER — HYDROCODONE-ACETAMINOPHEN 5-325 MG PO TABS: 1.0000 | ORAL_TABLET | Freq: Four times a day (QID) | ORAL | 0 refills | Status: DC | PRN

## 2022-03-31 MED ORDER — HYDROMORPHONE HCL 1 MG/ML IJ SOLN: 0.2500 mg | INTRAMUSCULAR | Status: DC | PRN

## 2022-03-31 MED ORDER — PROPOFOL 500 MG/50ML IV EMUL
INTRAVENOUS | Status: DC | PRN
  Administered 2022-03-31: 175 ug/kg/min via INTRAVENOUS

## 2022-03-31 MED ORDER — ACETAMINOPHEN 10 MG/ML IV SOLN: 1000.0000 mg | Freq: Once | INTRAVENOUS | Status: DC | PRN

## 2022-03-31 MED ORDER — BUPIVACAINE HCL 0.5 % IJ SOLN
INTRAMUSCULAR | Status: DC | PRN
  Administered 2022-03-31: 30 mL

## 2022-03-31 MED ORDER — LACTATED RINGERS IV BOLUS
500.0000 mL | Freq: Once | INTRAVENOUS | Status: AC
  Administered 2022-03-31: 500 mL via INTRAVENOUS

## 2022-03-31 MED ORDER — CEFAZOLIN SODIUM-DEXTROSE 2-4 GM/100ML-% IV SOLN: 2.0000 g | Freq: Four times a day (QID) | INTRAVENOUS | Status: DC

## 2022-03-31 MED ORDER — HYDROCODONE-ACETAMINOPHEN 5-325 MG PO TABS
ORAL_TABLET | ORAL | Status: AC
  Filled 2022-03-31: qty 1

## 2022-03-31 MED ORDER — BUPIVACAINE LIPOSOME 1.3 % IJ SUSP
INTRAMUSCULAR | Status: DC | PRN
  Administered 2022-03-31: 20 mL

## 2022-03-31 MED ORDER — MIDAZOLAM HCL 2 MG/2ML IJ SOLN
INTRAMUSCULAR | Status: AC
  Filled 2022-03-31: qty 2

## 2022-03-31 MED ORDER — OXYCODONE HCL 5 MG PO TABS: 5.0000 mg | ORAL_TABLET | Freq: Once | ORAL | Status: DC | PRN

## 2022-03-31 MED ORDER — CLONIDINE HCL (ANALGESIA) 100 MCG/ML EP SOLN
EPIDURAL | Status: DC | PRN
  Administered 2022-03-31: 100 ug

## 2022-03-31 MED ORDER — TRANEXAMIC ACID 1000 MG/10ML IV SOLN
2000.0000 mg | INTRAVENOUS | Status: DC
  Filled 2022-03-31 (×2): qty 20

## 2022-03-31 MED ORDER — ONDANSETRON HCL 4 MG/2ML IJ SOLN: 4.0000 mg | Freq: Four times a day (QID) | INTRAMUSCULAR | Status: DC | PRN

## 2022-03-31 MED ORDER — TRANEXAMIC ACID 1000 MG/10ML IV SOLN
INTRAVENOUS | Status: DC | PRN
  Administered 2022-03-31: 2000 mg via TOPICAL

## 2022-03-31 MED ORDER — ONDANSETRON HCL 4 MG PO TABS: 4.0000 mg | ORAL_TABLET | Freq: Four times a day (QID) | ORAL | Status: DC | PRN

## 2022-03-31 MED ORDER — ACETAMINOPHEN 500 MG PO TABS: 500.0000 mg | ORAL_TABLET | Freq: Four times a day (QID) | ORAL | Status: DC

## 2022-03-31 MED ORDER — BUPIVACAINE HCL (PF) 0.5 % IJ SOLN
INTRAMUSCULAR | Status: AC
  Filled 2022-03-31: qty 30

## 2022-03-31 MED ORDER — KETOROLAC TROMETHAMINE 15 MG/ML IJ SOLN
15.0000 mg | Freq: Four times a day (QID) | INTRAMUSCULAR | Status: DC
  Administered 2022-03-31: 15 mg via INTRAVENOUS

## 2022-03-31 MED ORDER — PROPOFOL 500 MG/50ML IV EMUL
INTRAVENOUS | Status: AC
  Filled 2022-03-31: qty 50

## 2022-03-31 MED ORDER — KETOROLAC TROMETHAMINE 15 MG/ML IJ SOLN
INTRAMUSCULAR | Status: AC
  Filled 2022-03-31: qty 1

## 2022-03-31 MED ORDER — LACTATED RINGERS IV SOLN: INTRAVENOUS | Status: DC

## 2022-03-31 MED ORDER — DEXMEDETOMIDINE HCL IN NACL 80 MCG/20ML IV SOLN
INTRAVENOUS | Status: DC | PRN
  Administered 2022-03-31 (×2): 8 ug via BUCCAL
  Administered 2022-03-31: 4 ug via BUCCAL

## 2022-03-31 MED ORDER — CEFAZOLIN SODIUM-DEXTROSE 2-4 GM/100ML-% IV SOLN
2.0000 g | INTRAVENOUS | Status: AC
  Administered 2022-03-31: 2 g via INTRAVENOUS
  Filled 2022-03-31: qty 100

## 2022-03-31 MED ORDER — POVIDONE-IODINE 10 % EX SWAB
2.0000 | Freq: Once | CUTANEOUS | Status: AC
  Administered 2022-03-31: 2 via TOPICAL

## 2022-03-31 MED ORDER — METOCLOPRAMIDE HCL 5 MG/ML IJ SOLN: 5.0000 mg | Freq: Three times a day (TID) | INTRAMUSCULAR | Status: DC | PRN

## 2022-03-31 MED ORDER — PHENYLEPHRINE 80 MCG/ML (10ML) SYRINGE FOR IV PUSH (FOR BLOOD PRESSURE SUPPORT)
PREFILLED_SYRINGE | INTRAVENOUS | Status: DC | PRN
  Administered 2022-03-31 (×3): 160 ug via INTRAVENOUS

## 2022-03-31 MED ORDER — HYDROCODONE-ACETAMINOPHEN 7.5-325 MG PO TABS: 1.0000 | ORAL_TABLET | ORAL | Status: DC | PRN

## 2022-03-31 MED ORDER — BUPIVACAINE LIPOSOME 1.3 % IJ SUSP: 20.0000 mL | Freq: Once | INTRAMUSCULAR | Status: DC

## 2022-03-31 MED ORDER — ORAL CARE MOUTH RINSE: 15.0000 mL | Freq: Once | OROMUCOSAL | Status: AC

## 2022-03-31 MED ORDER — 0.9 % SODIUM CHLORIDE (POUR BTL) OPTIME
TOPICAL | Status: DC | PRN
  Administered 2022-03-31: 1000 mL

## 2022-03-31 MED ORDER — DEXAMETHASONE SODIUM PHOSPHATE 10 MG/ML IJ SOLN
INTRAMUSCULAR | Status: AC
  Filled 2022-03-31: qty 1

## 2022-03-31 MED ORDER — HYDROCODONE-ACETAMINOPHEN 5-325 MG PO TABS: 1.0000 | ORAL_TABLET | ORAL | Status: DC | PRN

## 2022-03-31 MED ORDER — TRANEXAMIC ACID-NACL 1000-0.7 MG/100ML-% IV SOLN: 1000.0000 mg | Freq: Once | INTRAVENOUS | Status: DC

## 2022-03-31 MED ORDER — TRAMADOL HCL 50 MG PO TABS
ORAL_TABLET | ORAL | Status: AC
  Filled 2022-03-31: qty 1

## 2022-03-31 MED ORDER — SODIUM CHLORIDE (PF) 0.9 % IJ SOLN
INTRAMUSCULAR | Status: DC | PRN
  Administered 2022-03-31 (×3): 10 mL

## 2022-03-31 MED ORDER — MORPHINE SULFATE (PF) 2 MG/ML IV SOLN: 0.5000 mg | INTRAVENOUS | Status: DC | PRN

## 2022-03-31 SURGICAL SUPPLY — 54 items
ATTUNE PS FEM LT SZ 3 CEM KNEE (Femur) IMPLANT
ATTUNE PSRP INSR SZ3 6 KNEE (Insert) IMPLANT
BAG COUNTER SPONGE SURGICOUNT (BAG) ×2 IMPLANT
BAG DECANTER FOR FLEXI CONT (MISCELLANEOUS) ×2 IMPLANT
BAG SPEC THK2 15X12 ZIP CLS (MISCELLANEOUS) ×1
BAG SPNG CNTER NS LX DISP (BAG) ×1
BAG ZIPLOCK 12X15 (MISCELLANEOUS) ×2 IMPLANT
BASE TIBIAL ROT PLAT SZ 5 KNEE (Knees) IMPLANT
BLADE SAGITTAL 25.0X1.19X90 (BLADE) ×2 IMPLANT
BLADE SAW SGTL 11.0X1.19X90.0M (BLADE) ×2 IMPLANT
BLADE SURG SZ10 CARB STEEL (BLADE) ×2 IMPLANT
BNDG ELASTIC 6X5.8 VLCR STR LF (GAUZE/BANDAGES/DRESSINGS) ×2 IMPLANT
BOOTIES KNEE HIGH SLOAN (MISCELLANEOUS) ×2 IMPLANT
BOWL SMART MIX CTS (DISPOSABLE) ×2 IMPLANT
BSPLAT TIB 5 CMNT ROT PLAT STR (Knees) ×1 IMPLANT
CEMENT HV SMART SET (Cement) ×4 IMPLANT
COVER SURGICAL LIGHT HANDLE (MISCELLANEOUS) ×2 IMPLANT
CUFF TOURN SGL QUICK 34 (TOURNIQUET CUFF) ×1
CUFF TRNQT CYL 34X4.125X (TOURNIQUET CUFF) ×2 IMPLANT
DRAPE SHEET LG 3/4 BI-LAMINATE (DRAPES) ×2 IMPLANT
DRAPE TOP 10253 STERILE (DRAPES) ×2 IMPLANT
DRAPE U-SHAPE 47X51 STRL (DRAPES) ×2 IMPLANT
DRSG AQUACEL AG ADV 3.5X10 (GAUZE/BANDAGES/DRESSINGS) ×2 IMPLANT
DURAPREP 26ML APPLICATOR (WOUND CARE) ×4 IMPLANT
ELECT REM PT RETURN 15FT ADLT (MISCELLANEOUS) ×2 IMPLANT
GLOVE BIO SURGEON STRL SZ8 (GLOVE) ×4 IMPLANT
GLOVE BIOGEL PI IND STRL 8 (GLOVE) ×4 IMPLANT
GOWN STRL REUS W/ TWL XL LVL3 (GOWN DISPOSABLE) ×4 IMPLANT
GOWN STRL REUS W/TWL XL LVL3 (GOWN DISPOSABLE) ×2
HANDPIECE INTERPULSE COAX TIP (DISPOSABLE) ×1
HOLDER FOLEY CATH W/STRAP (MISCELLANEOUS) IMPLANT
HOOD PEEL AWAY T7 (MISCELLANEOUS) ×6 IMPLANT
KIT TURNOVER KIT A (KITS) IMPLANT
MANIFOLD NEPTUNE II (INSTRUMENTS) ×2 IMPLANT
NEEDLE HYPO 22GX1.5 SAFETY (NEEDLE) ×2 IMPLANT
NS IRRIG 1000ML POUR BTL (IV SOLUTION) ×2 IMPLANT
PACK TOTAL KNEE CUSTOM (KITS) ×2 IMPLANT
PAD ARMBOARD 7.5X6 YLW CONV (MISCELLANEOUS) ×2 IMPLANT
PATELLA MEDIAL ATTUN 35MM KNEE (Knees) IMPLANT
PIN STEINMAN FIXATION KNEE (PIN) IMPLANT
PROTECTOR NERVE ULNAR (MISCELLANEOUS) ×2 IMPLANT
SET HNDPC FAN SPRY TIP SCT (DISPOSABLE) ×2 IMPLANT
SPIKE FLUID TRANSFER (MISCELLANEOUS) ×4 IMPLANT
SUT ETHIBOND NAB CT1 #1 30IN (SUTURE) ×4 IMPLANT
SUT VIC AB 0 CT1 36 (SUTURE) ×2 IMPLANT
SUT VIC AB 2-0 CT1 27 (SUTURE) ×1
SUT VIC AB 2-0 CT1 TAPERPNT 27 (SUTURE) ×2 IMPLANT
SUT VICRYL AB 3-0 FS1 BRD 27IN (SUTURE) ×2 IMPLANT
SUT VLOC 180 0 24IN GS25 (SUTURE) ×2 IMPLANT
TIBIAL BASE ROT PLAT SZ 5 KNEE (Knees) ×1 IMPLANT
TRAY FOLEY MTR SLVR 16FR STAT (SET/KITS/TRAYS/PACK) IMPLANT
WATER STERILE IRR 1000ML POUR (IV SOLUTION) ×2 IMPLANT
WRAP KNEE MAXI GEL POST OP (GAUZE/BANDAGES/DRESSINGS) ×2 IMPLANT
YANKAUER SUCT BULB TIP NO VENT (SUCTIONS) ×2 IMPLANT

## 2022-03-31 NOTE — Interval H&P Note (Signed)
History and Physical Interval Note:  03/31/2022 7:25 AM  [name] [name]  has presented today for surgery, with the diagnosis of LEFT KNEE DEGENERATIVE JOINT DISEASE.  The various methods of treatment have been discussed with the patient and family. After consideration of risks, benefits and other options for treatment, the patient has consented to  Procedure(s): LEFT TOTAL KNEE ARTHROPLASTY (Left) as a surgical intervention.  The patient's history has been reviewed, patient examined, no change in status, stable for surgery.  I have reviewed the patient's chart and labs.  Questions were answered to the patient's satisfaction.     Hessie Dibble

## 2022-03-31 NOTE — Transfer of Care (Signed)
Immediate Anesthesia Transfer of Care Note  [name]  Procedure(s) Performed: LEFT TOTAL KNEE ARTHROPLASTY (Left: Knee)  Patient Location: PACU  Anesthesia Type:GA combined with regional for post-op pain  Level of Consciousness: drowsy  Airway & Oxygen Therapy: Patient Spontanous Breathing and Patient connected to face mask oxygen  Post-op Assessment: Report given to RN and Post -op Vital signs reviewed and stable  Post vital signs: Reviewed and stable  Last Vitals:  Vitals Value Taken Time  BP    Temp    Pulse 64 03/31/22 0938  Resp 22 03/31/22 0938  SpO2 99 % 03/31/22 0938  Vitals shown include unvalidated device data.  Last Pain:  Vitals:   03/31/22 0616  TempSrc:   PainSc: 4       Patients Stated Pain Goal: 4 (72/09/47 0962)  Complications: No notable events documented.

## 2022-03-31 NOTE — Anesthesia Postprocedure Evaluation (Signed)
Anesthesia Post Note  [name]  Procedure(s) Performed: LEFT TOTAL KNEE ARTHROPLASTY (Left: Knee)     Patient location during evaluation: PACU Anesthesia Type: Regional and General Level of consciousness: awake and alert Pain management: pain level controlled Vital Signs Assessment: post-procedure vital signs reviewed and stable Respiratory status: spontaneous breathing, nonlabored ventilation, respiratory function stable and patient connected to nasal cannula oxygen Cardiovascular status: blood pressure returned to baseline and stable Postop Assessment: no apparent nausea or vomiting Anesthetic complications: no  No notable events documented.  Last Vitals:  Vitals:   03/31/22 1052 03/31/22 1100  BP: 135/74 (!) 150/82  Pulse: 76 82  Resp:    Temp:    SpO2: 94% 94%    Last Pain:  Vitals:   03/31/22 1130  TempSrc:   PainSc: 0-No pain                 Barnet Glasgow

## 2022-03-31 NOTE — Op Note (Signed)
PREOP DIAGNOSIS: DJD LEFT KNEE POSTOP DIAGNOSIS:  same PROCEDURE: LEFT TKR ANESTHESIA: general and block ATTENDING SURGEON: Hessie Dibble ASSISTANT: Loni Dolly PA  INDICATIONS FOR PROCEDURE: [name] [name] is a 57 y.o. female who has struggled for a long time with pain due to degenerative arthritis of the left knee.  The patient has failed many conservative non-operative measures and at this point has pain which limits the ability to sleep and walk.  The patient is offered total knee replacement.  Informed operative consent was obtained after discussion of possible risks of anesthesia, infection, neurovascular injury, DVT, and death.  The importance of the post-operative rehabilitation protocol to optimize result was stressed extensively with the patient.  SUMMARY OF FINDINGS AND PROCEDURE:  [name] [name] was taken to the operative suite where under the above anesthesia a left knee replacement was performed.  There were advanced degenerative changes and the bone quality was excellent.  We used the DePuyAttune system and placed size 3 femur, 5 tibia, 35 mm all polyethylene patella, and a size 6 mm spacer.  Loni Dolly PA-C assisted throughout and was invaluable to the completion of the case in that he helped retract and maintain exposure while I placed the components.  He also helped close thereby minimizing OR time.  The patient was admitted for appropriate post-op care to include perioperative antibiotics and mechanical and pharmacologic measures for DVT prophylaxis.  DESCRIPTION OF PROCEDURE:  [name] [name] was taken to the operative suite where the above anesthesia was applied.  The patient was positioned supine and prepped and draped in normal sterile fashion.  An appropriate time out was performed.  After the administration of kefzol pre-op antibiotic the leg was elevated and exsanguinated and a tourniquet inflated.  A standard longitudinal incision was made on the anterior knee.  Dissection  was carried down to the extensor mechanism.  All appropriate anti-infective measures were used including the pre-operative antibiotic, betadine impregnated drape, and closed hooded exhaust systems for each member of the surgical team.  A medial parapatellar incision was made in the extensor mechanism and the knee cap flipped and the knee flexed.  Some residual meniscal tissues were removed along with any remaining ACL/PCL tissue.  A guide was placed on the tibia and a flat cut was made on it's superior surface.  An intramedullary guide was placed in the femur and was utilized to make anterior and posterior cuts creating an appropriate flexion gap.  A second intramedullary guide was placed in the femur to make a distal cut properly balancing the knee with an extension gap equal to the flexion gap.  The three bones sized to the above mentioned sizes and the appropriate guides were placed and utilized.  A trial reduction was done and the knee easily came to full extension and the patella tracked well on flexion.  The trial components were removed and all bones were cleaned with pulsatile lavage and then dried thoroughly.  Cement was mixed and was pressurized onto the bones followed by placement of the aforementioned components.  Excess cement was trimmed and pressure was held on the components until the cement had hardened.  The tourniquet was deflated and a small amount of bleeding was controlled with cautery and pressure.  The knee was irrigated thoroughly.  The extensor mechanism was re-approximated with #1 ethibond in interrupted fashion.  The knee was flexed and the repair was solid.  The subcutaneous tissues were re-approximated with #0 and #2-0 vicryl and the skin  closed with a subcuticular stitch and steristrips.  A sterile dressing was applied.  Intraoperative fluids, EBL, and tourniquet time can be obtained from anesthesia records.  DISPOSITION:  The patient was taken to recovery room in stable condition  and scheduled to potentially go home same day depending on ability to walk and tolerate liquids.  [name] [name] 03/31/2022, 9:02 AM

## 2022-03-31 NOTE — Anesthesia Procedure Notes (Signed)
Spinal  Patient location during procedure: OR Start time: 03/31/2022 7:30 AM End time: 03/31/2022 7:38 AM Reason for block: surgical anesthesia Staffing Performed: anesthesiologist  Anesthesiologist: Barnet Glasgow, MD Performed by: Barnet Glasgow, MD Authorized by: Barnet Glasgow, MD   Preanesthetic Checklist Completed: patient identified, IV checked, risks and benefits discussed, surgical consent, monitors and equipment checked, pre-op evaluation and timeout performed Spinal Block Patient position: sitting Prep: DuraPrep and site prepped and draped Patient monitoring: heart rate, cardiac monitor, continuous pulse ox and blood pressure Approach: midline Location: L3-4 Injection technique: single-shot Needle Needle type: Pencan  Needle gauge: 24 G Needle length: 10 cm Assessment Sensory level: T4 Events: CSF return Additional Notes  1 Attempt (s) @ L 2-3 unsuccessful, 1 Attempt L 3-4 unsuccessful moved to R paramedian also unsuccessful . Pt tolerated procedure well.

## 2022-03-31 NOTE — Evaluation (Signed)
Physical Therapy Evaluation Patient Details Name: [name] [name] MRN: [id number] DOB: Apr 24, 1965 Today's Date: 03/31/2022  History of Present Illness  Pt is a 57yo female presenting s/p L-TKA on 03/31/22. PMH: afib, DM, HLD, lumbar fusion, HTN, RLS, anxiety & depression, cervical fusion.   Clinical Impression  [name] [name] is a 58 y.o. female POD 0 s/p L-TKA. Patient reports independence with mobility at baseline. Patient is now limited by functional impairments (see PT problem list below) and requires min guard for transfers and gait with RW. Patient was able to ambulate 80 feet with RW and min guard and cues for safe walker management. Patient educated on safe sequencing for stair mobility and verbalized safe guarding position for people assisting with mobility. Patient instructed in exercises to facilitate ROM and circulation. Patient will benefit from continued skilled PT interventions to address impairments and progress towards PLOF. Patient has met mobility goals at adequate level for discharge home; will continue to follow if pt continues acute stay to progress towards Mod I goals.`       Recommendations for follow up therapy are one component of a multi-disciplinary discharge planning process, led by the attending physician.  Recommendations may be updated based on patient status, additional functional criteria and insurance authorization.  Follow Up Recommendations Follow physician's recommendations for discharge plan and follow up therapies      Assistance Recommended at Discharge Frequent or constant Supervision/Assistance  Patient can return home with the following  A little help with walking and/or transfers;A little help with bathing/dressing/bathroom;Assistance with cooking/housework;Assist for transportation;Help with stairs or ramp for entrance    Equipment Recommendations Rolling walker (2 wheels)  Recommendations for Other Services       Functional Status Assessment  Patient has had a recent decline in their functional status and demonstrates the ability to make significant improvements in function in a reasonable and predictable amount of time.     Precautions / Restrictions Precautions Precautions: Fall;Knee Precaution Booklet Issued: No Precaution Comments: no pillow under the knee Restrictions Weight Bearing Restrictions: No Other Position/Activity Restrictions: wbat      Mobility  Bed Mobility Overal bed mobility: Needs Assistance Bed Mobility: Supine to Sit     Supine to sit: Supervision, HOB elevated     General bed mobility comments: For safety ony    Transfers Overall transfer level: Needs assistance Equipment used: Rolling walker (2 wheels) Transfers: Sit to/from Stand Sit to Stand: Min guard, From elevated surface           General transfer comment: For safety only from PACU stretcher, VCs for sequencing    Ambulation/Gait Ambulation/Gait assistance: Min guard Gait Distance (Feet): 80 Feet Assistive device: Rolling walker (2 wheels) Gait Pattern/deviations: Step-to pattern Gait velocity: decreased     General Gait Details: Pt ambulated with RW and min guard, no physical assist required or overt LOB noted, VCs for sequencing.  Stairs Stairs: Yes Stairs assistance: Min guard Stair Management: Two rails, Step to pattern, Forwards Number of Stairs: 2 General stair comments: Pt completed stair training with min guard no overt LOB noted o rphysical assist required, VCs for sequencing  Wheelchair Mobility    Modified Rankin (Stroke Patients Only)       Balance Overall balance assessment: Needs assistance Sitting-balance support: Feet supported, No upper extremity supported Sitting balance-Leahy Scale: Good     Standing balance support: Reliant on assistive device for balance, During functional activity, Bilateral upper extremity supported Standing balance-Leahy Scale: Poor  Pertinent Vitals/Pain Pain Assessment Pain Assessment: 0-10 Pain Score: 3  Pain Location: left knee Pain Descriptors / Indicators: Operative site guarding Pain Intervention(s): Limited activity within patient's tolerance, Monitored during session, Repositioned, Ice applied    Home Living Family/patient expects to be discharged to:: Private residence Living Arrangements: Spouse/significant other Available Help at Discharge: Family;Available 24 hours/day Type of Home: House Home Access: Stairs to enter Entrance Stairs-Rails: Right;Left;Can reach both Entrance Stairs-Number of Steps: 4   Home Layout: One level Home Equipment: Cane - single point      Prior Function Prior Level of Function : Independent/Modified Independent;Working/employed;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[employer], retail)             Mobility Comments: IND ADLs Comments: IND     Hand Dominance        Extremity/Trunk Assessment   Upper Extremity Assessment Upper Extremity Assessment: Overall WFL for tasks assessed    Lower Extremity Assessment Lower Extremity Assessment: RLE deficits/detail;LLE deficits/detail RLE Deficits / Details: MMT ank DF/PF 5/5 RLE Sensation: WNL LLE Deficits / Details: MMT ank DF/PF 3+/5, able to perform SLR, mild extensor lag noted, LLE Sensation: WNL    Cervical / Trunk Assessment Cervical / Trunk Assessment: Normal  Communication   Communication: No difficulties  Cognition Arousal/Alertness: Awake/alert Behavior During Therapy: WFL for tasks assessed/performed Overall Cognitive Status: Within Functional Limits for tasks assessed                                          General Comments General comments (skin integrity, edema, etc.): DIL Beverlee Nims present    Exercises Total Joint Exercises Ankle Circles/Pumps: AROM, Both, 20 reps Quad Sets: AROM, Left, Other reps (comment) (3) Short Arc Quad: AROM, Left, Other reps (comment) (3) Heel Slides: AROM, Left, Other reps  (comment) (2) Hip ABduction/ADduction: AROM, Left, Other reps (comment) (3) Straight Leg Raises: AROM, Left, Other reps (comment) (3) Goniometric ROM: -5-40deg by gross visual approximation   Assessment/Plan    PT Assessment Patient needs continued PT services  PT Problem List Decreased strength;Decreased range of motion;Decreased activity tolerance;Decreased balance;Decreased mobility;Pain       PT Treatment Interventions Gait training;DME instruction;Stair training;Functional mobility training;Therapeutic activities;Therapeutic exercise;Balance training;Neuromuscular re-education;Patient/family education    PT Goals (Current goals can be found in the Care Plan section)  Acute Rehab PT Goals Patient Stated Goal: to go home PT Goal Formulation: With patient Time For Goal Achievement: 04/07/22 Potential to Achieve Goals: Good    Frequency 7X/week     Co-evaluation               AM-PAC PT "6 Clicks" Mobility  Outcome Measure Help needed turning from your back to your side while in a flat bed without using bedrails?: None Help needed moving from lying on your back to sitting on the side of a flat bed without using bedrails?: A Little Help needed moving to and from a bed to a chair (including a wheelchair)?: A Little Help needed standing up from a chair using your arms (e.g., wheelchair or bedside chair)?: A Little Help needed to walk in hospital room?: A Little Help needed climbing 3-5 steps with a railing? : A Little 6 Click Score: 19    End of Session Equipment Utilized During Treatment: Gait belt Activity Tolerance: Patient tolerated treatment well Patient left: in chair;with call bell/phone within reach;with family/visitor present Nurse Communication: Mobility status PT Visit Diagnosis: Pain;Difficulty in  walking, not elsewhere classified (R26.2) Pain - Right/Left: Left Pain - part of body: Knee    Time: 0459-1368 PT Time Calculation (min) (ACUTE ONLY): 33  min   Charges:   PT Evaluation $PT Eval Low Complexity: 1 Low PT Treatments $Gait Training: 8-22 mins        Coolidge Breeze, PT, DPT [facility] Department Office: [phone] Weekend pager: [phone]  Coolidge Breeze 03/31/2022, 12:59 PM

## 2022-03-31 NOTE — Addendum Note (Signed)
Addendum  created 03/31/22 1237 by Barnet Glasgow, MD   Child order released for a procedure order, Clinical Note Signed, Intraprocedure Blocks edited, Intraprocedure Event edited, SmartForm saved

## 2022-03-31 NOTE — Anesthesia Procedure Notes (Signed)
Procedure Name: LMA Insertion Date/Time: 03/31/2022 7:47 AM  Performed by: West Pugh, CRNAPre-anesthesia Checklist: Patient identified, Emergency Drugs available, Suction available, Patient being monitored and Timeout performed Patient Re-evaluated:Patient Re-evaluated prior to induction Oxygen Delivery Method: Circle system utilized Preoxygenation: Pre-oxygenation with 100% oxygen Induction Type: IV induction Ventilation: Mask ventilation without difficulty LMA: LMA with gastric port inserted Number of attempts: 1 Placement Confirmation: positive ETCO2 Tube secured with: Tape Dental Injury: Teeth and Oropharynx as per pre-operative assessment

## 2022-03-31 NOTE — Anesthesia Procedure Notes (Signed)
Anesthesia Regional Block: Adductor canal block   Pre-Anesthetic Checklist: , timeout performed,  Correct Patient, Correct Site, Correct Laterality,  Correct Procedure, Correct Position, site marked,  Risks and benefits discussed,  Surgical consent,  Pre-op evaluation,  At surgeon's request and post-op pain management  Laterality: Lower and Left  Prep: chloraprep       Needles:  Injection technique: Single-shot  Needle Type: Echogenic Needle     Needle Length: 9cm  Needle Gauge: 22     Additional Needles:   Procedures:,,,, ultrasound used (permanent image in chart),,    Narrative:  Start time: 03/31/2022 6:41 AM End time: 03/31/2022 6:46 AM Injection made incrementally with aspirations every 5 mL.  Performed by: Personally  Anesthesiologist: Barnet Glasgow, MD  Additional Notes: Block assessed prior to surgery. Pt tolerated procedure well.

## 2022-04-02 ENCOUNTER — Encounter (HOSPITAL_COMMUNITY): Payer: Self-pay | Admitting: Orthopaedic Surgery

## 2022-04-02 DIAGNOSIS — R69 Illness, unspecified: Secondary | ICD-10-CM | POA: Diagnosis not present

## 2022-04-02 DIAGNOSIS — M48061 Spinal stenosis, lumbar region without neurogenic claudication: Secondary | ICD-10-CM | POA: Diagnosis not present

## 2022-04-02 DIAGNOSIS — G43909 Migraine, unspecified, not intractable, without status migrainosus: Secondary | ICD-10-CM | POA: Diagnosis not present

## 2022-04-02 DIAGNOSIS — D6869 Other thrombophilia: Secondary | ICD-10-CM | POA: Diagnosis not present

## 2022-04-02 DIAGNOSIS — G2581 Restless legs syndrome: Secondary | ICD-10-CM | POA: Diagnosis not present

## 2022-04-02 DIAGNOSIS — J45909 Unspecified asthma, uncomplicated: Secondary | ICD-10-CM | POA: Diagnosis not present

## 2022-04-02 DIAGNOSIS — I4891 Unspecified atrial fibrillation: Secondary | ICD-10-CM | POA: Diagnosis not present

## 2022-04-02 DIAGNOSIS — G894 Chronic pain syndrome: Secondary | ICD-10-CM | POA: Diagnosis not present

## 2022-04-02 DIAGNOSIS — E785 Hyperlipidemia, unspecified: Secondary | ICD-10-CM | POA: Diagnosis not present

## 2022-04-02 DIAGNOSIS — Z7901 Long term (current) use of anticoagulants: Secondary | ICD-10-CM | POA: Diagnosis not present

## 2022-04-02 DIAGNOSIS — Z8616 Personal history of COVID-19: Secondary | ICD-10-CM | POA: Diagnosis not present

## 2022-04-02 DIAGNOSIS — Q761 Klippel-Feil syndrome: Secondary | ICD-10-CM | POA: Diagnosis not present

## 2022-04-02 DIAGNOSIS — E039 Hypothyroidism, unspecified: Secondary | ICD-10-CM | POA: Diagnosis not present

## 2022-04-02 DIAGNOSIS — Z96652 Presence of left artificial knee joint: Secondary | ICD-10-CM | POA: Diagnosis not present

## 2022-04-02 DIAGNOSIS — F419 Anxiety disorder, unspecified: Secondary | ICD-10-CM | POA: Diagnosis not present

## 2022-04-02 DIAGNOSIS — E538 Deficiency of other specified B group vitamins: Secondary | ICD-10-CM | POA: Diagnosis not present

## 2022-04-02 DIAGNOSIS — Z981 Arthrodesis status: Secondary | ICD-10-CM | POA: Diagnosis not present

## 2022-04-02 DIAGNOSIS — E559 Vitamin D deficiency, unspecified: Secondary | ICD-10-CM | POA: Diagnosis not present

## 2022-04-02 DIAGNOSIS — I10 Essential (primary) hypertension: Secondary | ICD-10-CM | POA: Diagnosis not present

## 2022-04-02 DIAGNOSIS — G4700 Insomnia, unspecified: Secondary | ICD-10-CM | POA: Diagnosis not present

## 2022-04-02 DIAGNOSIS — Z471 Aftercare following joint replacement surgery: Secondary | ICD-10-CM | POA: Diagnosis not present

## 2022-04-02 DIAGNOSIS — M5116 Intervertebral disc disorders with radiculopathy, lumbar region: Secondary | ICD-10-CM | POA: Diagnosis not present

## 2022-04-02 DIAGNOSIS — E1140 Type 2 diabetes mellitus with diabetic neuropathy, unspecified: Secondary | ICD-10-CM | POA: Diagnosis not present

## 2022-04-04 DIAGNOSIS — G894 Chronic pain syndrome: Secondary | ICD-10-CM | POA: Diagnosis not present

## 2022-04-04 DIAGNOSIS — E039 Hypothyroidism, unspecified: Secondary | ICD-10-CM | POA: Diagnosis not present

## 2022-04-04 DIAGNOSIS — G43909 Migraine, unspecified, not intractable, without status migrainosus: Secondary | ICD-10-CM | POA: Diagnosis not present

## 2022-04-04 DIAGNOSIS — R69 Illness, unspecified: Secondary | ICD-10-CM | POA: Diagnosis not present

## 2022-04-04 DIAGNOSIS — Z7901 Long term (current) use of anticoagulants: Secondary | ICD-10-CM | POA: Diagnosis not present

## 2022-04-04 DIAGNOSIS — M5116 Intervertebral disc disorders with radiculopathy, lumbar region: Secondary | ICD-10-CM | POA: Diagnosis not present

## 2022-04-04 DIAGNOSIS — Z471 Aftercare following joint replacement surgery: Secondary | ICD-10-CM | POA: Diagnosis not present

## 2022-04-04 DIAGNOSIS — E559 Vitamin D deficiency, unspecified: Secondary | ICD-10-CM | POA: Diagnosis not present

## 2022-04-04 DIAGNOSIS — G4700 Insomnia, unspecified: Secondary | ICD-10-CM | POA: Diagnosis not present

## 2022-04-04 DIAGNOSIS — E538 Deficiency of other specified B group vitamins: Secondary | ICD-10-CM | POA: Diagnosis not present

## 2022-04-04 DIAGNOSIS — Z8616 Personal history of COVID-19: Secondary | ICD-10-CM | POA: Diagnosis not present

## 2022-04-04 DIAGNOSIS — Z96652 Presence of left artificial knee joint: Secondary | ICD-10-CM | POA: Diagnosis not present

## 2022-04-04 DIAGNOSIS — F419 Anxiety disorder, unspecified: Secondary | ICD-10-CM | POA: Diagnosis not present

## 2022-04-04 DIAGNOSIS — M48061 Spinal stenosis, lumbar region without neurogenic claudication: Secondary | ICD-10-CM | POA: Diagnosis not present

## 2022-04-04 DIAGNOSIS — E785 Hyperlipidemia, unspecified: Secondary | ICD-10-CM | POA: Diagnosis not present

## 2022-04-04 DIAGNOSIS — Z981 Arthrodesis status: Secondary | ICD-10-CM | POA: Diagnosis not present

## 2022-04-04 DIAGNOSIS — J45909 Unspecified asthma, uncomplicated: Secondary | ICD-10-CM | POA: Diagnosis not present

## 2022-04-04 DIAGNOSIS — Q761 Klippel-Feil syndrome: Secondary | ICD-10-CM | POA: Diagnosis not present

## 2022-04-04 DIAGNOSIS — D6869 Other thrombophilia: Secondary | ICD-10-CM | POA: Diagnosis not present

## 2022-04-04 DIAGNOSIS — I4891 Unspecified atrial fibrillation: Secondary | ICD-10-CM | POA: Diagnosis not present

## 2022-04-04 DIAGNOSIS — I10 Essential (primary) hypertension: Secondary | ICD-10-CM | POA: Diagnosis not present

## 2022-04-04 DIAGNOSIS — G2581 Restless legs syndrome: Secondary | ICD-10-CM | POA: Diagnosis not present

## 2022-04-04 DIAGNOSIS — E1140 Type 2 diabetes mellitus with diabetic neuropathy, unspecified: Secondary | ICD-10-CM | POA: Diagnosis not present

## 2022-04-07 DIAGNOSIS — G894 Chronic pain syndrome: Secondary | ICD-10-CM | POA: Diagnosis not present

## 2022-04-07 DIAGNOSIS — E538 Deficiency of other specified B group vitamins: Secondary | ICD-10-CM | POA: Diagnosis not present

## 2022-04-07 DIAGNOSIS — Q761 Klippel-Feil syndrome: Secondary | ICD-10-CM | POA: Diagnosis not present

## 2022-04-07 DIAGNOSIS — F419 Anxiety disorder, unspecified: Secondary | ICD-10-CM | POA: Diagnosis not present

## 2022-04-07 DIAGNOSIS — G2581 Restless legs syndrome: Secondary | ICD-10-CM | POA: Diagnosis not present

## 2022-04-07 DIAGNOSIS — Z96652 Presence of left artificial knee joint: Secondary | ICD-10-CM | POA: Diagnosis not present

## 2022-04-07 DIAGNOSIS — D6869 Other thrombophilia: Secondary | ICD-10-CM | POA: Diagnosis not present

## 2022-04-07 DIAGNOSIS — I10 Essential (primary) hypertension: Secondary | ICD-10-CM | POA: Diagnosis not present

## 2022-04-07 DIAGNOSIS — R69 Illness, unspecified: Secondary | ICD-10-CM | POA: Diagnosis not present

## 2022-04-07 DIAGNOSIS — E559 Vitamin D deficiency, unspecified: Secondary | ICD-10-CM | POA: Diagnosis not present

## 2022-04-07 DIAGNOSIS — J45909 Unspecified asthma, uncomplicated: Secondary | ICD-10-CM | POA: Diagnosis not present

## 2022-04-07 DIAGNOSIS — M48061 Spinal stenosis, lumbar region without neurogenic claudication: Secondary | ICD-10-CM | POA: Diagnosis not present

## 2022-04-07 DIAGNOSIS — E039 Hypothyroidism, unspecified: Secondary | ICD-10-CM | POA: Diagnosis not present

## 2022-04-07 DIAGNOSIS — E1140 Type 2 diabetes mellitus with diabetic neuropathy, unspecified: Secondary | ICD-10-CM | POA: Diagnosis not present

## 2022-04-07 DIAGNOSIS — E785 Hyperlipidemia, unspecified: Secondary | ICD-10-CM | POA: Diagnosis not present

## 2022-04-07 DIAGNOSIS — M5116 Intervertebral disc disorders with radiculopathy, lumbar region: Secondary | ICD-10-CM | POA: Diagnosis not present

## 2022-04-07 DIAGNOSIS — I4891 Unspecified atrial fibrillation: Secondary | ICD-10-CM | POA: Diagnosis not present

## 2022-04-07 DIAGNOSIS — G4700 Insomnia, unspecified: Secondary | ICD-10-CM | POA: Diagnosis not present

## 2022-04-07 DIAGNOSIS — G43909 Migraine, unspecified, not intractable, without status migrainosus: Secondary | ICD-10-CM | POA: Diagnosis not present

## 2022-04-07 DIAGNOSIS — Z471 Aftercare following joint replacement surgery: Secondary | ICD-10-CM | POA: Diagnosis not present

## 2022-04-07 DIAGNOSIS — Z8616 Personal history of COVID-19: Secondary | ICD-10-CM | POA: Diagnosis not present

## 2022-04-07 DIAGNOSIS — Z981 Arthrodesis status: Secondary | ICD-10-CM | POA: Diagnosis not present

## 2022-04-07 DIAGNOSIS — Z7901 Long term (current) use of anticoagulants: Secondary | ICD-10-CM | POA: Diagnosis not present

## 2022-04-08 DIAGNOSIS — M1712 Unilateral primary osteoarthritis, left knee: Secondary | ICD-10-CM | POA: Diagnosis not present

## 2022-04-09 ENCOUNTER — Other Ambulatory Visit: Payer: Self-pay | Admitting: Family Medicine

## 2022-05-11 DIAGNOSIS — M67912 Unspecified disorder of synovium and tendon, left shoulder: Secondary | ICD-10-CM | POA: Diagnosis not present

## 2022-05-11 DIAGNOSIS — M19012 Primary osteoarthritis, left shoulder: Secondary | ICD-10-CM | POA: Diagnosis not present

## 2022-05-29 ENCOUNTER — Ambulatory Visit: Payer: 59 | Admitting: Family Medicine

## 2022-06-03 ENCOUNTER — Ambulatory Visit (INDEPENDENT_AMBULATORY_CARE_PROVIDER_SITE_OTHER): Payer: 59 | Admitting: Family Medicine

## 2022-06-03 ENCOUNTER — Encounter: Payer: Self-pay | Admitting: Family Medicine

## 2022-06-03 VITALS — BP 130/67 | HR 71 | Temp 98.1°F | Wt 213.2 lb

## 2022-06-03 DIAGNOSIS — F339 Major depressive disorder, recurrent, unspecified: Secondary | ICD-10-CM | POA: Diagnosis not present

## 2022-06-03 DIAGNOSIS — E559 Vitamin D deficiency, unspecified: Secondary | ICD-10-CM

## 2022-06-03 DIAGNOSIS — R7309 Other abnormal glucose: Secondary | ICD-10-CM | POA: Diagnosis not present

## 2022-06-03 DIAGNOSIS — E034 Atrophy of thyroid (acquired): Secondary | ICD-10-CM | POA: Diagnosis not present

## 2022-06-03 DIAGNOSIS — I10 Essential (primary) hypertension: Secondary | ICD-10-CM | POA: Diagnosis not present

## 2022-06-03 DIAGNOSIS — J4520 Mild intermittent asthma, uncomplicated: Secondary | ICD-10-CM

## 2022-06-03 DIAGNOSIS — M5136 Other intervertebral disc degeneration, lumbar region: Secondary | ICD-10-CM

## 2022-06-03 DIAGNOSIS — E538 Deficiency of other specified B group vitamins: Secondary | ICD-10-CM

## 2022-06-03 DIAGNOSIS — M2550 Pain in unspecified joint: Secondary | ICD-10-CM

## 2022-06-03 DIAGNOSIS — E785 Hyperlipidemia, unspecified: Secondary | ICD-10-CM

## 2022-06-03 DIAGNOSIS — I480 Paroxysmal atrial fibrillation: Secondary | ICD-10-CM

## 2022-06-03 DIAGNOSIS — D6869 Other thrombophilia: Secondary | ICD-10-CM

## 2022-06-03 LAB — T4, FREE: Free T4: 1.85 ng/dL — ABNORMAL HIGH (ref 0.60–1.60)

## 2022-06-03 LAB — TSH: TSH: 3.14 u[IU]/mL (ref 0.35–5.50)

## 2022-06-03 MED ORDER — ALBUTEROL SULFATE HFA 108 (90 BASE) MCG/ACT IN AERS: 2.0000 | INHALATION_SPRAY | Freq: Four times a day (QID) | RESPIRATORY_TRACT | 1 refills | Status: DC | PRN

## 2022-06-03 MED ORDER — MAGNESIUM OXIDE 400 MG PO TABS: 1.0000 | ORAL_TABLET | Freq: Every day | ORAL | 3 refills | Status: DC

## 2022-06-03 MED ORDER — METOPROLOL SUCCINATE ER 25 MG PO TB24: 12.5000 mg | ORAL_TABLET | Freq: Every day | ORAL | 1 refills | Status: DC

## 2022-06-03 MED ORDER — DULOXETINE HCL 60 MG PO CPEP: 60.0000 mg | ORAL_CAPSULE | Freq: Every day | ORAL | 1 refills | Status: DC

## 2022-06-03 MED ORDER — TELMISARTAN 80 MG PO TABS: 80.0000 mg | ORAL_TABLET | Freq: Every day | ORAL | 1 refills | Status: DC

## 2022-06-03 MED ORDER — BUPROPION HCL ER (XL) 300 MG PO TB24: 300.0000 mg | ORAL_TABLET | Freq: Every day | ORAL | 1 refills | Status: DC

## 2022-06-03 NOTE — Progress Notes (Signed)
Patient ID: [name] [name], female  DOB: [birth year], 56 y.o.   MRN: [id number] Patient Care Team    Relationship Specialty Notifications Start End  [name] Hillock, DO PCP - General Family Medicine  07/07/16   [name] Liter, MD PCP - Cardiology Cardiology  02/09/22   Ob/Gyn, [name] [name]    07/08/16   [name] Corner, MD Consulting Physician Gastroenterology  07/08/16   [name] Ducking, MD (Inactive) Consulting Physician Neurology  07/08/16   [name] Herald, MD Attending Physician Endocrinology  07/08/16    Comment: Pt established with Si Raider, NP at this location. - [facility]- thyroid  [name] Kief, MD Consulting Physician Rehabilitation  03/06/19    Comment: Neurosurgeon-spine and scoliosis specialist    Chief Complaint  Patient presents with   Depression    [place]; Pt is not fasting    Subjective: [name] [name] is a 57 y.o.  Female  present for [facility] All past medical history, surgical history, allergies, family history, immunizations, medications and social history were updated in the electronic medical record today. All recent labs, ED visits and hospitalizations within the last year were reviewed.  Elevated hemoglobin A1c Patient reports she has been able to lose weight by portion control, higher protein diet with exercise.    Hypothyroidism due to acquired atrophy of thyroid Pt reports compliance with levo 150 mcg qd on an empty stomach.    Recurrent major depressive disorder, remission status unspecified ([place]) Pt reports compliance with wellbutrin and cymbalta. She feels her condition is well-controlled on these meds.    HTN/Paroxysmal atrial fibrillation (HCC)/Acquired thrombophilia (HCC)-eliquis/Morbid obesity (HCC)/dyslipidemia Pt reports compliance with telmisartan 80 mg qd and Patient denies chest pain, shortness of breath, dizziness or lower extremity edema.  metoprolol 12.5 mg qd   Pt is prescribed eliquis.   Arthralgia, unspecified joint/DDD  (degenerative disc disease), lumbar Pt reports Cymbalta is helping a great deal and keeping her active.  B12 deficiency/Vitamin D deficiency She is supplemenenting     06/03/2022   10:48 AM 07/15/2021    1:54 PM 04/29/2021   10:58 AM 03/18/2021    4:06 PM 10/02/2020    8:07 AM  Depression screen PHQ 2/9  Decreased Interest 0 1 0 0 1  Down, Depressed, Hopeless 0 1 0 0 1  PHQ - 2 Score 0 2 0 0 2  Altered sleeping 3 3  0 2  Tired, decreased energy 0 3  0 1  Change in appetite 0 3  0 1  Feeling bad or failure about yourself  0   0 0  Trouble concentrating 3 3  0 0  Moving slowly or fidgety/restless 0 0  0 0  Suicidal thoughts 0 0  0 0  PHQ-9 Score 6 14  0 6  Difficult doing work/chores    Not difficult at all       07/15/2021    1:54 PM 10/02/2020    8:07 AM  GAD 7 : Generalized Anxiety Score  Nervous, Anxious, on Edge 2 3  Control/stop worrying 1 0  Worry too much - different things 1 0  Trouble relaxing 3 3  Restless 2 1  Easily annoyed or irritable 1 2  Afraid - awful might happen 0 0  Total GAD 7 Score 10 9     Immunization History  Administered Date(s) Administered   Influenza,inj,Quad PF,6+ Mos 01/25/2018, 12/05/2019, 12/12/2021   Influenza,trivalent, recombinat, inj, PF 12/04/2016   Influenza-Unspecified 01/25/2018, 12/05/2019  Tdap 07/15/2021   Zoster Recombinat (Shingrix) 07/15/2021, 12/12/2021    Past Medical History:  Diagnosis Date   AC joint derangement 03/26/2021   Acute bronchitis due to COVID-19 virus 09/06/2020   Anxiety 01/12/2013   Arthralgia 07/08/2016   Asthma    Atrial fibrillation 12/06/2018   B12 deficiency 01/31/2018   Chicken pox    Chronic insomnia 11/24/2016   Clostridium difficile infection 04/02/2014, 02/06/2014   Complex tear of medial meniscus of left knee as current injury 12/01/2018   Complex tear of medial meniscus of left knee as current injury [id number]   Complication of anesthesia    DDD (degenerative disc disease), lumbar  09/09/2018   Depression    Diabetes mellitus without complication [id number]   Dyslipidemia    Dysrhythmia    Elevated hemoglobin A1c 07/08/2016   Estrogen deficiency 12/06/2018   Foraminal stenosis of lumbar region 09/09/2018   Hair loss 07/20/2017   History of lumbar fusion 09/09/2018   Hormone replacement therapy (HRT) 07/20/2017   HTN (hypertension)    Hypertension 07/08/2016   Hypokalemia 12/06/2018   Hypothyroidism    Left medial tibial plateau fracture 11/10/2018   Lumbar radiculopathy 09/09/2018   Referred to [facility] 09/09/2018   Migraine    Mild memory disturbance 11/13/2013   Morbid obesity 07/08/2016   Multiple allergies    Obesity    PONV (postoperative nausea and vomiting)    Rash associated with COVID-19 10/02/2020   Restless legs syndrome (RLS) 01/12/2013   Follows with Dr. Jannifer Franklin who prescribes Requip and gabapentin   RLS (restless legs syndrome)    Scapular dysfunction 04/02/2021   Stress fracture of rib 04/15/2021   Vitamin D deficiency 12/06/2018   Wears glasses 07/08/2016   Allergies  Allergen Reactions   Bee Venom Swelling and Anaphylaxis   Yellow Jacket Venom Swelling   Biaxin [Clarithromycin] Diarrhea and Rash   Robaxin [Methocarbamol] Swelling    Feet, chest pain, headache   Abilify [Aripiprazole] Other (See Comments)    MOOD SWING   Buspar [Buspirone] Diarrhea   Hydrochlorothiazide Rash   Lisinopril Cough   Maxzide [Triamterene-Hctz] Rash   Phentermine Other (See Comments)    MOOD CHANGE    Quetiapine Other (See Comments)    Felt funny MOOD SWINGS   Seroquel [Quetiapine Fumarate] Other (See Comments)    MOOD SWINGS   Past Surgical History:  Procedure Laterality Date   BREAST BIOPSY  [id number]   x 2 benign lesion   CERVICAL DISCECTOMY  2003   CERVICAL FUSION  2010   x2   Hand fracture surgery     LUMBAR FUSION  2016   TOTAL KNEE ARTHROPLASTY Left 03/31/2022   Procedure: LEFT TOTAL KNEE ARTHROPLASTY;  Surgeon: Melrose Nakayama, MD;   Location: [facility];  Service: Orthopedics;  Laterality: Left;   VAGINAL HYSTERECTOMY  2009   Family History  Problem Relation Age of Onset   Hypertension Father    Diabetes Father    Heart disease Father    Hearing loss Father    Diabetes Brother    Liver cancer Mother    COPD Mother    Mental illness Mother    Diabetes Mother    Hearing loss Mother    Heart disease Mother    Arthritis/Rheumatoid Mother    Social History   Social History Narrative   Patient is married [name])  2 children [name] and [place])   Patient is right handed.   Patient has a college education. 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    Patient drinks 1 cups daily. Uses herbal remedies, takes a daily vitamin.   Wears her seatbelt, smoke detector in the home.    Allergies as of 06/03/2022       Reactions   Bee Venom Swelling, Anaphylaxis   Yellow Jacket Venom Swelling   Biaxin [clarithromycin] Diarrhea, Rash   Robaxin [methocarbamol] Swelling   Feet, chest pain, headache   Abilify [aripiprazole] Other (See Comments)   MOOD SWING   Buspar [buspirone] Diarrhea   Hydrochlorothiazide Rash   Lisinopril Cough   Maxzide [triamterene-hctz] Rash   Phentermine Other (See Comments)   MOOD CHANGE   Quetiapine Other (See Comments)   Felt funny MOOD SWINGS   Seroquel [quetiapine Fumarate] Other (See Comments)   MOOD SWINGS        Medication List        Accurate as of June 03, 2022 11:00 AM. If you have any questions, ask your nurse or doctor.          STOP taking these medications    FISH OIL PO Stopped by: Howard Pouch, DO   HYDROcodone-acetaminophen 5-325 MG tablet Commonly known as: NORCO/VICODIN Stopped by: Howard Pouch, DO   tiZANidine 4 MG tablet Commonly known as: Zanaflex Stopped by: Howard Pouch, DO       TAKE these medications    albuterol 108 (90 Base) MCG/ACT inhaler Commonly known as: VENTOLIN HFA Inhale 2 puffs into the lungs every 6 (six) hours as needed for wheezing or shortness  of breath.   buPROPion 300 MG 24 hr tablet Commonly known as: WELLBUTRIN XL Take 1 tablet (300 mg total) by mouth daily.   DULoxetine 60 MG capsule Commonly known as: CYMBALTA Take 1 capsule (60 mg total) by mouth daily.   Eliquis 5 MG Tabs tablet Generic drug: apixaban TAKE 1 TABLET BY MOUTH TWICE A DAY   gabapentin 300 MG capsule Commonly known as: NEURONTIN Take 2 capsules (600 mg total) by mouth 2 (two) times daily.   levothyroxine 150 MCG tablet Commonly known as: SYNTHROID Take 1 tablet (150 mcg total) by mouth daily.   magnesium oxide 400 MG tablet Commonly known as: MAG-OX Take 1 tablet (400 mg total) by mouth daily.   metoprolol succinate 25 MG 24 hr tablet Commonly known as: TOPROL-XL Take 0.5 tablets (12.5 mg total) by mouth daily.   rOPINIRole 2 MG tablet Commonly known as: REQUIP Take 1/2 tablet at lunch and 1/2 tablet at bed time.   rOPINIRole 4 MG 24 hr tablet Commonly known as: REQUIP XL Take 1 tablet (4 mg total) by mouth daily.   telmisartan 80 MG tablet Commonly known as: Micardis Take 1 tablet (80 mg total) by mouth daily.   Vitamin D 125 MCG (5000 UT) Caps Take 5,000 Units by mouth daily.        All past medical history, surgical history, allergies, family history, immunizations andmedications were updated in the EMR today and reviewed under the history and medication portions of their EMR.      ROS 14 pt review of systems performed and negative (unless mentioned in an HPI)  Objective: BP 130/67   Pulse 71   Temp 98.1 F (36.7 C)   Wt 213 lb 3.2 oz (96.7 kg)   SpO2 95%   BMI 37.77 kg/m  Physical Exam Vitals and nursing note reviewed.  Constitutional:      General: She is not in acute distress.    Appearance: Normal appearance. She is not ill-appearing,  toxic-appearing or diaphoretic.  HENT:     Head: Normocephalic and atraumatic.  Eyes:     General: No scleral icterus.       Right eye: No discharge.        Left eye: No  discharge.     Extraocular Movements: Extraocular movements intact.     Conjunctiva/sclera: Conjunctivae normal.     Pupils: Pupils are equal, round, and reactive to light.  Cardiovascular:     Rate and Rhythm: Normal rate and regular rhythm.  Pulmonary:     Effort: Pulmonary effort is normal. No respiratory distress.     Breath sounds: Normal breath sounds. No wheezing, rhonchi or rales.  Musculoskeletal:     Right lower leg: No edema.     Left lower leg: No edema.  Skin:    General: Skin is warm.     Findings: No rash.  Neurological:     Mental Status: She is alert and oriented to person, place, and time. Mental status is at baseline.     Motor: No weakness.     Gait: Gait normal.  Psychiatric:        Mood and Affect: Mood normal.        Behavior: Behavior normal.        Thought Content: Thought content normal.        Judgment: Judgment normal.      No results found.  Assessment/plan: SOMER DUKE is a 57 y.o. female present for chronic condition management Hypothyroidism/hair loss Stable Continue levothyroxine 150 mcg QD.  TSH and T4 free collected today   Recurrent major depressive disorder, remission status unspecified (HCC)/arthralgia Stable Continue Cymbalta 60 mg daily  Continue Wellbutrin 300 mg daily  -Requip and horizontal are prescribed by her neurology team.  Vitamin D deficiency: -Patient is taking 5000 units daily.   -Continue current supplementation - vit d collected today   Essential hypertension/morbid obesity/PAF/BMI 45.0-49.9, adult (HCC)/Acquired thrombophilia (HCC)-eliquis Stable Continue Micardis 80 mg  Continue   metoprolol 12.5 mg daily Continue Eliquis prescribed by cardiology Continue  to follow with cardiology Low sodium.   B12 deficiency -Continue supplementing vitamin B12   Return in about 24 weeks (around 11/18/2022) for Routine chronic condition follow-up.  Orders Placed This Encounter  Procedures   TSH   T4, free    Meds ordered this encounter  Medications   buPROPion (WELLBUTRIN XL) 300 MG 24 hr tablet    Sig: Take 1 tablet (300 mg total) by mouth daily.    Dispense:  90 tablet    Refill:  1   metoprolol succinate (TOPROL-XL) 25 MG 24 hr tablet    Sig: Take 0.5 tablets (12.5 mg total) by mouth daily.    Dispense:  45 tablet    Refill:  1   telmisartan (MICARDIS) 80 MG tablet    Sig: Take 1 tablet (80 mg total) by mouth daily.    Dispense:  90 tablet    Refill:  1   albuterol (VENTOLIN HFA) 108 (90 Base) MCG/ACT inhaler    Sig: Inhale 2 puffs into the lungs every 6 (six) hours as needed for wheezing or shortness of breath.    Dispense:  6.7 each    Refill:  1   DULoxetine (CYMBALTA) 60 MG capsule    Sig: Take 1 capsule (60 mg total) by mouth daily.    Dispense:  90 capsule    Refill:  1   magnesium oxide (MAG-OX) 400 MG tablet  Sig: Take 1 tablet (400 mg total) by mouth daily.    Dispense:  90 tablet    Refill:  3   Referral Orders  No referral(s) requested today     Electronically signed by: Howard Pouch, [place]

## 2022-06-03 NOTE — Patient Instructions (Addendum)
Return in about 24 weeks (around 11/18/2022) for Routine chronic condition follow-up.        Great to see you today.  I have refilled the medication(s) we provide.   If labs were collected, we will inform you of lab results once received either by echart message or telephone call.   - echart message- for normal results that have been seen by the patient already.   - telephone call: abnormal results or if patient has not viewed results in their echart.

## 2022-06-04 ENCOUNTER — Other Ambulatory Visit: Payer: Self-pay | Admitting: Family Medicine

## 2022-06-04 MED ORDER — LEVOTHYROXINE SODIUM 150 MCG PO TABS: 150.0000 ug | ORAL_TABLET | Freq: Every day | ORAL | 3 refills | Status: DC

## 2022-06-15 ENCOUNTER — Encounter: Payer: Self-pay | Admitting: *Deleted

## 2022-07-01 ENCOUNTER — Ambulatory Visit (INDEPENDENT_AMBULATORY_CARE_PROVIDER_SITE_OTHER): Payer: 59 | Admitting: Family Medicine

## 2022-07-01 ENCOUNTER — Encounter: Payer: Self-pay | Admitting: Family Medicine

## 2022-07-01 ENCOUNTER — Ambulatory Visit (HOSPITAL_BASED_OUTPATIENT_CLINIC_OR_DEPARTMENT_OTHER)
Admission: RE | Admit: 2022-07-01 | Discharge: 2022-07-01 | Disposition: A | Discharge status: Home / Self Care | Payer: 59 | Source: Ambulatory Visit | Attending: Family Medicine | Admitting: Family Medicine

## 2022-07-01 VITALS — BP 145/85 | HR 61 | Temp 98.0°F | Wt 211.0 lb

## 2022-07-01 DIAGNOSIS — M5090 Cervical disc disorder, unspecified, unspecified cervical region: Secondary | ICD-10-CM

## 2022-07-01 DIAGNOSIS — Z981 Arthrodesis status: Secondary | ICD-10-CM | POA: Diagnosis not present

## 2022-07-01 DIAGNOSIS — M5412 Radiculopathy, cervical region: Secondary | ICD-10-CM

## 2022-07-01 DIAGNOSIS — M542 Cervicalgia: Secondary | ICD-10-CM | POA: Diagnosis not present

## 2022-07-01 MED ORDER — METHYLPREDNISOLONE ACETATE 80 MG/ML IJ SUSP
80.0000 mg | Freq: Once | INTRAMUSCULAR | Status: AC
  Administered 2022-07-01: 80 mg via INTRAMUSCULAR

## 2022-07-01 MED ORDER — PREGABALIN 150 MG PO CAPS: 150.0000 mg | ORAL_CAPSULE | Freq: Two times a day (BID) | ORAL | 1 refills | Status: DC

## 2022-07-01 MED ORDER — PREDNISONE 20 MG PO TABS: ORAL_TABLET | ORAL | 0 refills | Status: DC

## 2022-07-01 MED ORDER — TIZANIDINE HCL 4 MG PO TABS: 4.0000 mg | ORAL_TABLET | Freq: Three times a day (TID) | ORAL | 5 refills | Status: DC | PRN

## 2022-07-01 NOTE — Progress Notes (Signed)
[name] [name] , [birth year], 57 y.o., female MRN: [id number] Patient Care Team    Relationship Specialty Notifications Start End  Natalia Leatherwood, DO PCP - General Family Medicine  07/07/16   Georgeanna Lea, MD PCP - Cardiology Cardiology  02/09/22   Ob/Gyn, Nestor Ramp    07/08/16   Charlott Rakes, MD Consulting Physician Gastroenterology  07/08/16   York Spaniel, MD (Inactive) Consulting Physician Neurology  07/08/16   Jones Bales, MD Attending Physician Endocrinology  07/08/16    Comment: Pt established with Ailene Ravel, NP at this location. - [facility]- thyroid  Letta Kocher, MD Consulting Physician Rehabilitation  03/06/19    Comment: Neurosurgeon-spine and scoliosis specialist    Chief Complaint  Patient presents with   Neck Pain    Started about a month ago. Gradually ha progressed; Pain and pops.has only been taking tylenol. More painful at night      Subjective: [name] [name] is a 57 y.o. Pt presents for an OV with complaints of neck pain of 4 weeks duration.  Associated symptoms include numbness/tingling sensation intermittently to left shoulder.  She endorses pain/popping sensation, especially first thing in the morning.  She reports after laying down/sleeping it is worse.  She believes she turns her head to the left during her sleeping.  She is prescribed gabapentin 600 mg twice daily, she feels this has not been working as well for for the conditions it is prescribed for her. Patient has a history of cervical fusion. She denies any recent injury.     07/01/2022   11:13 AM 06/03/2022   10:48 AM 07/15/2021    1:54 PM 04/29/2021   10:58 AM 03/18/2021    4:06 PM  Depression screen PHQ 2/9  Decreased Interest 0 0 1 0 0  Down, Depressed, Hopeless 1 0 1 0 0  PHQ - 2 Score 1 0 2 0 0  Altered sleeping 3 3 3   0  Tired, decreased energy 1 0 3  0  Change in appetite 1 0 3  0  Feeling bad or failure about yourself  0 0   0  Trouble concentrating 0 3 3  0   Moving slowly or fidgety/restless 0 0 0  0  Suicidal thoughts 0 0 0  0  PHQ-9 Score 6 6 14   0  Difficult doing work/chores     Not difficult at all    Allergies  Allergen Reactions   Bee Venom Swelling and Anaphylaxis   Yellow Jacket Venom Swelling   Biaxin [Clarithromycin] Diarrhea and Rash   Robaxin [Methocarbamol] Swelling    Feet, chest pain, headache   Abilify [Aripiprazole] Other (See Comments)    MOOD SWING   Buspar [Buspirone] Diarrhea   Hydrochlorothiazide Rash   Lisinopril Cough   Maxzide [Triamterene-Hctz] Rash   Phentermine Other (See Comments)    MOOD CHANGE    Quetiapine Other (See Comments)    Felt funny MOOD SWINGS   Seroquel [Quetiapine Fumarate] Other (See Comments)    MOOD SWINGS   Social History   Social History Narrative   Patient is married Gery Pray)  2 children Molly Maduro and Clyde)   Patient is right handed.   Patient has a college education. 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    Patient drinks 1 cups daily. Uses herbal remedies, takes a daily vitamin.   Wears her seatbelt, smoke detector in the home.   Past Medical History:  Diagnosis Date  AC joint derangement 03/26/2021   Acute bronchitis due to COVID-19 virus 09/06/2020   Anxiety 01/12/2013   Arthralgia 07/08/2016   Asthma    Atrial fibrillation (HCC) 12/06/2018   B12 deficiency 01/31/2018   Chicken pox    Chronic insomnia 11/24/2016   Clostridium difficile infection 04/02/2014, 02/06/2014   Complex tear of medial meniscus of left knee as current injury 12/01/2018   Complex tear of medial meniscus of left knee as current injury 12/01/2018   Complication of anesthesia    DDD (degenerative disc disease), lumbar 09/09/2018   Depression    Diabetes mellitus without complication (HCC) 12/07/2018   Dyslipidemia    Dysrhythmia    Elevated hemoglobin A1c 07/08/2016   Estrogen deficiency 12/06/2018   Foraminal stenosis of lumbar region 09/09/2018   Hair loss 07/20/2017   History of lumbar fusion  09/09/2018   Hormone replacement therapy (HRT) 07/20/2017   HTN (hypertension)    Hypertension 07/08/2016   Hypokalemia 12/06/2018   Hypothyroidism    Left medial tibial plateau fracture 11/10/2018   Lumbar radiculopathy 09/09/2018   Referred to [facility] 09/09/2018   Migraine    Mild memory disturbance 11/13/2013   Morbid obesity (HCC) 07/08/2016   Multiple allergies    Obesity    PONV (postoperative nausea and vomiting)    Rash associated with COVID-19 10/02/2020   Restless legs syndrome (RLS) 01/12/2013   Follows with Dr. Anne Hahn who prescribes Requip and gabapentin   RLS (restless legs syndrome)    Scapular dysfunction 04/02/2021   Stress fracture of rib 04/15/2021   Vitamin D deficiency 12/06/2018   Wears glasses 07/08/2016   Past Surgical History:  Procedure Laterality Date   BREAST BIOPSY  6578,4696   x 2 benign lesion   CERVICAL DISCECTOMY  2003   CERVICAL FUSION  2010   x2- C4-C6   Hand fracture surgery     LUMBAR FUSION  2016   TOTAL KNEE ARTHROPLASTY Left 03/31/2022   Procedure: LEFT TOTAL KNEE ARTHROPLASTY;  Surgeon: Marcene Corning, MD;  Location: [facility];  Service: Orthopedics;  Laterality: Left;   VAGINAL HYSTERECTOMY  2009   Family History  Problem Relation Age of Onset   Hypertension Father    Diabetes Father    Heart disease Father    Hearing loss Father    Diabetes Brother    Liver cancer Mother    COPD Mother    Mental illness Mother    Diabetes Mother    Hearing loss Mother    Heart disease Mother    Arthritis/Rheumatoid Mother    Allergies as of 07/01/2022       Reactions   Bee Venom Swelling, Anaphylaxis   Yellow Jacket Venom Swelling   Biaxin [clarithromycin] Diarrhea, Rash   Robaxin [methocarbamol] Swelling   Feet, chest pain, headache   Abilify [aripiprazole] Other (See Comments)   MOOD SWING   Buspar [buspirone] Diarrhea   Hydrochlorothiazide Rash   Lisinopril Cough   Maxzide [triamterene-hctz] Rash   Phentermine Other (See Comments)    MOOD CHANGE   Quetiapine Other (See Comments)   Felt funny MOOD SWINGS   Seroquel [quetiapine Fumarate] Other (See Comments)   MOOD SWINGS        Medication List        Accurate as of Jul 01, 2022 11:59 PM. If you have any questions, ask your nurse or doctor.          STOP taking these medications    gabapentin 300 MG capsule Commonly  known as: NEURONTIN Stopped by: Felix Pacini, DO       TAKE these medications    albuterol 108 (90 Base) MCG/ACT inhaler Commonly known as: VENTOLIN HFA Inhale 2 puffs into the lungs every 6 (six) hours as needed for wheezing or shortness of breath.   buPROPion 300 MG 24 hr tablet Commonly known as: WELLBUTRIN XL Take 1 tablet (300 mg total) by mouth daily.   DULoxetine 60 MG capsule Commonly known as: CYMBALTA Take 1 capsule (60 mg total) by mouth daily.   Eliquis 5 MG Tabs tablet Generic drug: apixaban TAKE 1 TABLET BY MOUTH TWICE A DAY   levothyroxine 150 MCG tablet Commonly known as: SYNTHROID Take 1 tablet (150 mcg total) by mouth daily.   magnesium oxide 400 MG tablet Commonly known as: MAG-OX Take 1 tablet (400 mg total) by mouth daily.   metoprolol succinate 25 MG 24 hr tablet Commonly known as: TOPROL-XL Take 0.5 tablets (12.5 mg total) by mouth daily.   predniSONE 20 MG tablet Commonly known as: DELTASONE 60 mg x2d, 40 mg x3d, 20 mg x2d, 10 mg x2d Started by: Felix Pacini, DO   pregabalin 150 MG capsule Commonly known as: Lyrica Take 1 capsule (150 mg total) by mouth 2 (two) times daily. Started by: Felix Pacini, DO   rOPINIRole 2 MG tablet Commonly known as: REQUIP Take 1/2 tablet at lunch and 1/2 tablet at bed time.   rOPINIRole 4 MG 24 hr tablet Commonly known as: REQUIP XL Take 1 tablet (4 mg total) by mouth daily.   telmisartan 80 MG tablet Commonly known as: Micardis Take 1 tablet (80 mg total) by mouth daily.   tiZANidine 4 MG tablet Commonly known as: Zanaflex Take 1 tablet (4 mg total)  by mouth every 8 (eight) hours as needed for muscle spasms. Started by: Felix Pacini, DO   Vitamin D 125 MCG (5000 UT) Caps Take 5,000 Units by mouth daily.        All past medical history, surgical history, allergies, family history, immunizations andmedications were updated in the EMR today and reviewed under the history and medication portions of their EMR.     ROS Negative, with the exception of above mentioned in HPI   Objective:  BP (!) 145/85   Pulse 61   Temp 98 F (36.7 C)   Wt 211 lb (95.7 kg)   SpO2 96%   BMI 37.38 kg/m  Body mass index is 37.38 kg/m. Physical Exam Vitals and nursing note reviewed.  Constitutional:      General: She is not in acute distress.    Appearance: Normal appearance. She is normal weight. She is not ill-appearing or toxic-appearing.  HENT:     Head: Normocephalic and atraumatic.  Eyes:     General: No scleral icterus.       Right eye: No discharge.        Left eye: No discharge.     Extraocular Movements: Extraocular movements intact.     Conjunctiva/sclera: Conjunctivae normal.     Pupils: Pupils are equal, round, and reactive to light.  Musculoskeletal:     Comments: Cervical spine: no erythema or swelling. TTP C4,5,6 (left side). DROM b/l SB and  rotation. Arm abd/adduction WNL. NV intact distally.    Skin:    Findings: No rash.  Neurological:     Mental Status: She is alert and oriented to person, place, and time. Mental status is at baseline.     Motor: No weakness.  Coordination: Coordination normal.     Gait: Gait normal.  Psychiatric:        Mood and Affect: Mood normal.        Behavior: Behavior normal.        Thought Content: Thought content normal.        Judgment: Judgment normal.     No results found. No results found. No results found for this or any previous visit (from the past 24 hour(s)).  Assessment/Plan: [name] [name] is a 57 y.o. female present for OV for  Cervical neck pain with evidence of  disc disease cervical radiculopathy/history of cervical fusion of the spine Currently has decreased range of motion of her cervical spine and bony tenderness at the level of her spinal fusion.  Will obtain x-rays today to ensure hardware appears intact. IM Depo-Medrol injection provided today.  Start prednisone taper tomorrow. Zanaflex/muscle relaxant prescribed to take at least nightly, if able could take through the day if needed. Discontinue gabapentin 600 twice daily and start Lyrica 150 twice daily - DG Cervical Spine Complete; Future - methylPREDNISolone acetate (DEPO-MEDROL) injection 80 mg Follow-up dependent upon x-ray results.  Patient will be called with results and follow-up plan will be discussed at that time.   Reviewed expectations re: course of current medical issues. Discussed self-management of symptoms. Outlined signs and symptoms indicating need for more acute intervention. Patient verbalized understanding and all questions were answered. Patient received an After-Visit Summary.    Orders Placed This Encounter  Procedures   DG Cervical Spine Complete   Meds ordered this encounter  Medications   predniSONE (DELTASONE) 20 MG tablet    Sig: 60 mg x2d, 40 mg x3d, 20 mg x2d, 10 mg x2d    Dispense:  15 tablet    Refill:  0   tiZANidine (ZANAFLEX) 4 MG tablet    Sig: Take 1 tablet (4 mg total) by mouth every 8 (eight) hours as needed for muscle spasms.    Dispense:  90 tablet    Refill:  5   methylPREDNISolone acetate (DEPO-MEDROL) injection 80 mg   pregabalin (LYRICA) 150 MG capsule    Sig: Take 1 capsule (150 mg total) by mouth 2 (two) times daily.    Dispense:  180 capsule    Refill:  1    DC gabapentin   Referral Orders  No referral(s) requested today     Note is dictated utilizing voice recognition software. Although note has been proof read prior to signing, occasional typographical errors still can be missed. If any questions arise, please do not  hesitate to call for verification.   electronically signed by:  Felix Pacini, DO  [facility] - [place]

## 2022-07-01 NOTE — Patient Instructions (Addendum)
Return if symptoms worsen or fail to improve.        Great to see you today.  I have refilled the medication(s) we provide.   If labs were collected, we will inform you of lab results once received either by echart message or telephone call.   - echart message- for normal results that have been seen by the patient already.   - telephone call: abnormal results or if patient has not viewed results in their echart.  

## 2022-07-07 DIAGNOSIS — M67912 Unspecified disorder of synovium and tendon, left shoulder: Secondary | ICD-10-CM | POA: Diagnosis not present

## 2022-07-07 DIAGNOSIS — M4722 Other spondylosis with radiculopathy, cervical region: Secondary | ICD-10-CM | POA: Diagnosis not present

## 2022-07-15 ENCOUNTER — Ambulatory Visit (INDEPENDENT_AMBULATORY_CARE_PROVIDER_SITE_OTHER): Payer: 59 | Admitting: Family Medicine

## 2022-07-15 ENCOUNTER — Encounter: Payer: Self-pay | Admitting: Family Medicine

## 2022-07-15 ENCOUNTER — Other Ambulatory Visit: Payer: Self-pay | Admitting: Cardiology

## 2022-07-15 VITALS — BP 131/73 | HR 67 | Temp 98.2°F | Wt 216.6 lb

## 2022-07-15 DIAGNOSIS — G894 Chronic pain syndrome: Secondary | ICD-10-CM | POA: Diagnosis not present

## 2022-07-15 DIAGNOSIS — Q761 Klippel-Feil syndrome: Secondary | ICD-10-CM | POA: Diagnosis not present

## 2022-07-15 DIAGNOSIS — M5412 Radiculopathy, cervical region: Secondary | ICD-10-CM | POA: Diagnosis not present

## 2022-07-15 MED ORDER — HYDROCODONE-ACETAMINOPHEN 10-325 MG PO TABS: 1.0000 | ORAL_TABLET | Freq: Four times a day (QID) | ORAL | 0 refills | Status: AC | PRN

## 2022-07-15 MED ORDER — METAXALONE 800 MG PO TABS: 800.0000 mg | ORAL_TABLET | Freq: Three times a day (TID) | ORAL | 5 refills | Status: DC | PRN

## 2022-07-15 NOTE — Progress Notes (Signed)
[name] [name] , 1966-01-20, 57 y.o., female MRN: [id number] Patient Care Team    Relationship Specialty Notifications Start End  Natalia Leatherwood, DO PCP - General Family Medicine  07/07/16   Georgeanna Lea, MD PCP - Cardiology Cardiology  02/09/22   Ob/Gyn, Nestor Ramp    07/08/16   Charlott Rakes, MD Consulting Physician Gastroenterology  07/08/16   York Spaniel, MD (Inactive) Consulting Physician Neurology  07/08/16   Jones Bales, MD Attending Physician Endocrinology  07/08/16    Comment: Pt established with Ailene Ravel, NP at this location. - [facility]- [facility]  Letta Kocher, MD Consulting Physician Rehabilitation  03/06/19    Comment: Neurosurgeon-spine and scoliosis specialist    Chief Complaint  Patient presents with   Neck Pain    1.5 month; left side; ortho order MRI for 05/22 -[facility]     Subjective: [name] [name] is a 57 y.o. Pt presents for an OV with complaints of continued worsening neck pain.  She was seen 2 weeks ago for this complaint, at that time pain had been present for 4 to 6 weeks.  She was provided with a steroid burst, muscle relaxant and transition from gabapentin to Lyrica.  She reports she did see some improvement with the start of Lyrica and the prednisone.  However after prednisone was finished, pain resurfaced.  She reports now it is tender to even touch the left side of her neck.  She has a history of cervical fusion.  X-ray completed 2 weeks ago did not mention hardware complication. Prior note: of 4 weeks duration.  Associated symptoms include numbness/tingling sensation intermittently to left shoulder.  She endorses pain/popping sensation, especially first thing in the morning.  She reports after laying down/sleeping it is worse.  She believes she turns her head to the left during her sleeping.  She is prescribed gabapentin 600 mg twice daily, she feels this has not been working as well for for the conditions it is  prescribed for her. Patient has a history of cervical fusion. She denies any recent injury.  Cervical spine x-ray 07/01/2022 Anterior cervical spine fusion C4-C5 Degenerative disc disease C3-C4 Upper cervical spine facet degenerative changes endplate osteophytosis C3-4.  Cervical spine MRI 05/2021 Multilevel degenerative change greatest at C7-T1. Anterior listhesis of 2-3 mm mild to moderate canal stenosis and moderate bilateral foraminal stenosis at C7-T1. C4-C7 solid bony fusion with C4-C5 ACDF.     07/15/2022   11:06 AM 07/01/2022   11:13 AM 06/03/2022   10:48 AM 07/15/2021    1:54 PM 04/29/2021   10:58 AM  Depression screen PHQ 2/9  Decreased Interest 0 0 0 1 0  Down, Depressed, Hopeless 0 1 0 1 0  PHQ - 2 Score 0 1 0 2 0  Altered sleeping 3 3 3 3    Tired, decreased energy 2 1 0 3   Change in appetite 0 1 0 3   Feeling bad or failure about yourself  0 0 0    Trouble concentrating 1 0 3 3   Moving slowly or fidgety/restless 0 0 0 0   Suicidal thoughts 0 0 0 0   PHQ-9 Score 6 6 6 14      Allergies  Allergen Reactions   Bee Venom Swelling and Anaphylaxis   Yellow Jacket Venom Swelling   Biaxin [Clarithromycin] Diarrhea and Rash   Robaxin [Methocarbamol] Swelling    Feet, chest pain, headache   Abilify [Aripiprazole] Other (  See Comments)    MOOD SWING   Buspar [Buspirone] Diarrhea   Hydrochlorothiazide Rash   Lisinopril Cough   Maxzide [Triamterene-Hctz] Rash   Phentermine Other (See Comments)    MOOD CHANGE    Quetiapine Other (See Comments)    Felt funny MOOD SWINGS   Seroquel [Quetiapine Fumarate] Other (See Comments)    MOOD SWINGS   Social History   Social History Narrative   Patient is married Gery Pray)  2 children Molly Maduro and [place])   Patient is right handed.   Patient has a college education. 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    Patient drinks 1 cups daily. Uses herbal remedies, takes a daily vitamin.   Wears her seatbelt, smoke detector in the home.   Past  Medical History:  Diagnosis Date   AC joint derangement 03/26/2021   Acute bronchitis due to COVID-19 virus 09/06/2020   Anxiety 01/12/2013   Arthralgia 07/08/2016   Asthma    Atrial fibrillation (HCC) 12/06/2018   B12 deficiency 01/31/2018   Chicken pox    Chronic insomnia 11/24/2016   Clostridium difficile infection 04/02/2014, 02/06/2014   Complex tear of medial meniscus of left knee as current injury 12/01/2018   Complex tear of medial meniscus of left knee as current injury 12/01/2018   Complication of anesthesia    DDD (degenerative disc disease), lumbar 09/09/2018   Depression    Diabetes mellitus without complication (HCC) 12/07/2018   Dyslipidemia    Dysrhythmia    Elevated hemoglobin A1c 07/08/2016   Estrogen deficiency 12/06/2018   Foraminal stenosis of lumbar region 09/09/2018   Hair loss 07/20/2017   History of lumbar fusion 09/09/2018   Hormone replacement therapy (HRT) 07/20/2017   HTN (hypertension)    Hypertension 07/08/2016   Hypokalemia 12/06/2018   Hypothyroidism    Left medial tibial plateau fracture 11/10/2018   Lumbar radiculopathy 09/09/2018   Referred to NS 09/09/2018   Migraine    Mild memory disturbance 11/13/2013   Morbid obesity (HCC) 07/08/2016   Multiple allergies    Obesity    PONV (postoperative nausea and vomiting)    Rash associated with COVID-19 10/02/2020   Restless legs syndrome (RLS) 01/12/2013   Follows with Dr. Anne Hahn who prescribes Requip and gabapentin   RLS (restless legs syndrome)    Scapular dysfunction 04/02/2021   Stress fracture of rib 04/15/2021   Vitamin D deficiency 12/06/2018   Wears glasses 07/08/2016   Past Surgical History:  Procedure Laterality Date   BREAST BIOPSY  1610,9604   x 2 benign lesion   CERVICAL DISCECTOMY  2003   CERVICAL FUSION  2010   x2- C4-C6   Hand fracture surgery     LUMBAR FUSION  2016   TOTAL KNEE ARTHROPLASTY Left 03/31/2022   Procedure: LEFT TOTAL KNEE ARTHROPLASTY;  Surgeon:  Marcene Corning, MD;  Location: [facility];  Service: Orthopedics;  Laterality: Left;   VAGINAL HYSTERECTOMY  2009   Family History  Problem Relation Age of Onset   Hypertension Father    Diabetes Father    Heart disease Father    Hearing loss Father    Diabetes Brother    Liver cancer Mother    COPD Mother    Mental illness Mother    Diabetes Mother    Hearing loss Mother    Heart disease Mother    Arthritis/Rheumatoid Mother    Allergies as of 07/15/2022       Reactions   Bee Venom Swelling, Anaphylaxis   Yellow Jacket  Venom Swelling   Biaxin [clarithromycin] Diarrhea, Rash   Robaxin [methocarbamol] Swelling   Feet, chest pain, headache   Abilify [aripiprazole] Other (See Comments)   MOOD SWING   Buspar [buspirone] Diarrhea   Hydrochlorothiazide Rash   Lisinopril Cough   Maxzide [triamterene-hctz] Rash   Phentermine Other (See Comments)   MOOD CHANGE   Quetiapine Other (See Comments)   Felt funny MOOD SWINGS   Seroquel [quetiapine Fumarate] Other (See Comments)   MOOD SWINGS        Medication List        Accurate as of Jul 15, 2022 11:27 AM. If you have any questions, ask your nurse or doctor.          STOP taking these medications    predniSONE 20 MG tablet Commonly known as: DELTASONE Stopped by: Felix Pacini, DO       TAKE these medications    albuterol 108 (90 Base) MCG/ACT inhaler Commonly known as: VENTOLIN HFA Inhale 2 puffs into the lungs every 6 (six) hours as needed for wheezing or shortness of breath.   buPROPion 300 MG 24 hr tablet Commonly known as: WELLBUTRIN XL Take 1 tablet (300 mg total) by mouth daily.   DULoxetine 60 MG capsule Commonly known as: CYMBALTA Take 1 capsule (60 mg total) by mouth daily.   Eliquis 5 MG Tabs tablet Generic drug: apixaban TAKE 1 TABLET BY MOUTH TWICE A DAY   HYDROcodone-acetaminophen 10-325 MG tablet Commonly known as: NORCO Take 1 tablet by mouth every 6 (six) hours as needed for up to 7  days. Started by: Felix Pacini, DO   levothyroxine 150 MCG tablet Commonly known as: SYNTHROID Take 1 tablet (150 mcg total) by mouth daily.   magnesium oxide 400 MG tablet Commonly known as: MAG-OX Take 1 tablet (400 mg total) by mouth daily.   metaxalone 800 MG tablet Commonly known as: SKELAXIN Take 1 tablet (800 mg total) by mouth 3 (three) times daily as needed for muscle spasms. Started by: Felix Pacini, DO   metoprolol succinate 25 MG 24 hr tablet Commonly known as: TOPROL-XL Take 0.5 tablets (12.5 mg total) by mouth daily.   pregabalin 150 MG capsule Commonly known as: Lyrica Take 1 capsule (150 mg total) by mouth 2 (two) times daily.   rOPINIRole 2 MG tablet Commonly known as: REQUIP Take 1/2 tablet at lunch and 1/2 tablet at bed time.   rOPINIRole 4 MG 24 hr tablet Commonly known as: REQUIP XL Take 1 tablet (4 mg total) by mouth daily.   telmisartan 80 MG tablet Commonly known as: Micardis Take 1 tablet (80 mg total) by mouth daily.   tiZANidine 4 MG tablet Commonly known as: Zanaflex Take 1 tablet (4 mg total) by mouth every 8 (eight) hours as needed for muscle spasms.   Vitamin D 125 MCG (5000 UT) Caps Take 5,000 Units by mouth daily.        All past medical history, surgical history, allergies, family history, immunizations andmedications were updated in the EMR today and reviewed under the history and medication portions of their EMR.     ROS Negative, with the exception of above mentioned in HPI   Objective:  BP 131/73   Pulse 67   Temp 98.2 F (36.8 C)   Wt 216 lb 9.6 oz (98.2 kg)   SpO2 98%   BMI 38.37 kg/m  Body mass index is 38.37 kg/m. Physical Exam Vitals and nursing note reviewed.  Constitutional:  General: She is not in acute distress.    Appearance: Normal appearance. She is normal weight. She is not ill-appearing or toxic-appearing.  HENT:     Head: Normocephalic and atraumatic.  Eyes:     General: No scleral  icterus.       Right eye: No discharge.        Left eye: No discharge.     Extraocular Movements: Extraocular movements intact.     Conjunctiva/sclera: Conjunctivae normal.     Pupils: Pupils are equal, round, and reactive to light.  Musculoskeletal:     Comments: Cervical spine: no erythema or swelling. TTP C4,5,6 (left side). DROM b/l SB and  rotation. Arm abd/adduction WNL. NV intact distally.    Skin:    Findings: No rash.  Neurological:     Mental Status: She is alert and oriented to person, place, and time. Mental status is at baseline.     Motor: No weakness.     Coordination: Coordination normal.     Gait: Gait normal.  Psychiatric:        Mood and Affect: Mood normal.        Behavior: Behavior normal.        Thought Content: Thought content normal.        Judgment: Judgment normal.     No results found. No results found. No results found for this or any previous visit (from the past 24 hour(s)).  Assessment/Plan: [name] [name] is a 57 y.o. female present for OV for  Cervical neck pain with evidence of disc disease cervical radiculopathy/history of cervical fusion of the spine Currently has decreased range of motion of her cervical spine and bony tenderness at the level of her spinal fusion.  Would try to refrain from another round of steroids. She has a scheduled MRI on May 22 through [facility] Continue Lyrica 150 twice daily (discontinued gabapentin) Chronic NSAIDs contraindicated with chronic anticoagulation needed. DC Zanaflex, start Skelaxin 3 times daily Hydrocodone 4 times daily as needed for pain x 7 days.  Will not be able to refill this medicine for her without a face-to-face visit if needed.  [place] controlled substance database reviewed and appropriate.  I also encourage her to call the orthopedic team and let them know that her pain is worsening to see if they can move up the MRI ordered through them.  Reviewed expectations re: course of  current medical issues. Discussed self-management of symptoms. Outlined signs and symptoms indicating need for more acute intervention. Patient verbalized understanding and all questions were answered. Patient received an After-Visit Summary.    No orders of the defined types were placed in this encounter.  Meds ordered this encounter  Medications   HYDROcodone-acetaminophen (NORCO) 10-325 MG tablet    Sig: Take 1 tablet by mouth every 6 (six) hours as needed for up to 7 days.    Dispense:  28 tablet    Refill:  0   metaxalone (SKELAXIN) 800 MG tablet    Sig: Take 1 tablet (800 mg total) by mouth 3 (three) times daily as needed for muscle spasms.    Dispense:  90 tablet    Refill:  5   Referral Orders  No referral(s) requested today     Note is dictated utilizing voice recognition software. Although note has been proof read prior to signing, occasional typographical errors still can be missed. If any questions arise, please do not hesitate to call for verification.   electronically signed by:  Howard Pouch, DO  [facility] - OR

## 2022-07-15 NOTE — Telephone Encounter (Signed)
Prescription refill request for Eliquis received. Indication:afib Last office visit:12/23 Scr:0.6 1/24 Age: 57 Weight:98.2  kg  Prescription refilled

## 2022-07-20 DIAGNOSIS — M542 Cervicalgia: Secondary | ICD-10-CM | POA: Diagnosis not present

## 2022-07-22 ENCOUNTER — Ambulatory Visit: Payer: 59 | Attending: Cardiology | Admitting: Cardiology

## 2022-07-22 ENCOUNTER — Encounter: Payer: Self-pay | Admitting: Cardiology

## 2022-07-22 VITALS — BP 124/84 | HR 72 | Ht 62.5 in | Wt 213.0 lb

## 2022-07-22 DIAGNOSIS — I480 Paroxysmal atrial fibrillation: Secondary | ICD-10-CM

## 2022-07-22 DIAGNOSIS — I10 Essential (primary) hypertension: Secondary | ICD-10-CM

## 2022-07-22 DIAGNOSIS — E785 Hyperlipidemia, unspecified: Secondary | ICD-10-CM

## 2022-07-22 NOTE — Progress Notes (Signed)
Cardiology Office Note:    Date:  07/22/2022   ID:  [name] [name], DOB 28-May-1965, MRN [id number]  PCP:  Natalia Leatherwood, DO  Cardiologist:  Gypsy Balsam, MD    Referring MD: Natalia Leatherwood, DO   Chief Complaint  Patient presents with   Follow-up  Doing fine  History of Present Illness:    [name] [name] is a 57 y.o. female past medical history significant for paroxysmal atrial fibrillation, she does have 1 documented episode of atrial fibrillation, anticoagulated, still have some palpitation but those are rare, chest vascular equals 3 for high blood pressure being a woman history of congestive heart failure.  There is also some question about potentially having TIA so she is anticoagulated.  Comes today to months for follow-up overall doing well denies have any chest pain tightness squeezing pressure burning chest no palpitation dizziness swelling of lower extremities palpitations are very rare  Past Medical History:  Diagnosis Date   AC joint derangement 03/26/2021   Acute bronchitis due to COVID-19 virus 09/06/2020   Anxiety 01/12/2013   Arthralgia 07/08/2016   Asthma    Atrial fibrillation (HCC) 12/06/2018   B12 deficiency 01/31/2018   Chicken pox    Chronic insomnia 11/24/2016   Clostridium difficile infection 04/02/2014, 02/06/2014   Complex tear of medial meniscus of left knee as current injury 12/01/2018   Complex tear of medial meniscus of left knee as current injury 12/01/2018   Complication of anesthesia    DDD (degenerative disc disease), lumbar 09/09/2018   Depression    Diabetes mellitus without complication (HCC) 12/07/2018   Dyslipidemia    Dysrhythmia    Elevated hemoglobin A1c 07/08/2016   Estrogen deficiency 12/06/2018   Foraminal stenosis of lumbar region 09/09/2018   Hair loss 07/20/2017   History of lumbar fusion 09/09/2018   Hormone replacement therapy (HRT) 07/20/2017   HTN (hypertension)    Hypertension 07/08/2016   Hypokalemia 12/06/2018    Hypothyroidism    Left medial tibial plateau fracture 11/10/2018   Lumbar radiculopathy 09/09/2018   Referred to NS 09/09/2018   Migraine    Mild memory disturbance 11/13/2013   Morbid obesity (HCC) 07/08/2016   Multiple allergies    Obesity    PONV (postoperative nausea and vomiting)    Rash associated with COVID-19 10/02/2020   Restless legs syndrome (RLS) 01/12/2013   Follows with Dr. Anne Hahn who prescribes Requip and gabapentin   RLS (restless legs syndrome)    Scapular dysfunction 04/02/2021   Stress fracture of rib 04/15/2021   Vitamin D deficiency 12/06/2018   Wears glasses 07/08/2016    Past Surgical History:  Procedure Laterality Date   BREAST BIOPSY  [id number]   x 2 benign lesion   CERVICAL DISCECTOMY  2003   CERVICAL FUSION  2010   x2- C4-C6   Hand fracture surgery     LUMBAR FUSION  2016   TOTAL KNEE ARTHROPLASTY Left 03/31/2022   Procedure: LEFT TOTAL KNEE ARTHROPLASTY;  Surgeon: Marcene Corning, MD;  Location: [facility];  Service: Orthopedics;  Laterality: Left;   VAGINAL HYSTERECTOMY  2009    Current Medications: Current Meds  Medication Sig   albuterol (VENTOLIN HFA) 108 (90 Base) MCG/ACT inhaler Inhale 2 puffs into the lungs every 6 (six) hours as needed for wheezing or shortness of breath.   buPROPion (WELLBUTRIN XL) 300 MG 24 hr tablet Take 1 tablet (300 mg total) by mouth daily.   Cholecalciferol (VITAMIN D) 125 MCG (5000 UT) CAPS Take  5,000 Units by mouth daily.   DULoxetine (CYMBALTA) 60 MG capsule Take 1 capsule (60 mg total) by mouth daily.   ELIQUIS 5 MG TABS tablet TAKE 1 TABLET BY MOUTH TWICE A DAY (Patient taking differently: Take 5 mg by mouth 2 (two) times daily.)   HYDROcodone-acetaminophen (NORCO) 10-325 MG tablet Take 1 tablet by mouth every 6 (six) hours as needed for up to 7 days. (Patient taking differently: Take 1 tablet by mouth every 6 (six) hours as needed for moderate pain or severe pain.)   levothyroxine (SYNTHROID) 150 MCG tablet  Take 1 tablet (150 mcg total) by mouth daily.   magnesium oxide (MAG-OX) 400 MG tablet Take 1 tablet (400 mg total) by mouth daily.   metaxalone (SKELAXIN) 800 MG tablet Take 1 tablet (800 mg total) by mouth 3 (three) times daily as needed for muscle spasms.   metoprolol succinate (TOPROL-XL) 25 MG 24 hr tablet Take 0.5 tablets (12.5 mg total) by mouth daily.   pregabalin (LYRICA) 150 MG capsule Take 1 capsule (150 mg total) by mouth 2 (two) times daily.   rOPINIRole (REQUIP XL) 4 MG 24 hr tablet Take 1 tablet (4 mg total) by mouth daily.   rOPINIRole (REQUIP) 2 MG tablet Take 1/2 tablet at lunch and 1/2 tablet at bed time. (Patient taking differently: Take 1 mg by mouth 2 (two) times daily. Take 1/2 tablet at lunch and 1/2 tablet at bed time.)   telmisartan (MICARDIS) 80 MG tablet Take 1 tablet (80 mg total) by mouth daily.   tiZANidine (ZANAFLEX) 4 MG tablet Take 1 tablet (4 mg total) by mouth every 8 (eight) hours as needed for muscle spasms.     Allergies:   Bee venom, Yellow jacket venom, Biaxin [clarithromycin], Robaxin [methocarbamol], Abilify [aripiprazole], Buspar [buspirone], Hydrochlorothiazide, Lisinopril, Maxzide [triamterene-hctz], Phentermine, Quetiapine, and Seroquel [quetiapine fumarate]   Social History   Socioeconomic History   Marital status: Married    Spouse name: Gery Pray   Number of children: 2   Years of education: COLLEGE   Highest education level: Associate degree: occupational, Scientist, product/process development, or vocational program  Occupational History   Occupation: OWNER    Employer: [employer]  Tobacco Use   Smoking status: Never    Passive exposure: Never   Smokeless tobacco: Never  Vaping Use   Vaping Use: Never used  Substance and Sexual Activity   Alcohol use: No    Alcohol/week: 0.0 standard drinks of alcohol   Drug use: No   Sexual activity: Yes    Partners: Male    Birth control/protection: None  Other Topics Concern   Not on file  Social History Narrative    Patient is married Gery Pray)  2 children Molly [place]uro and [place])   Patient is right handed.   Patient has a college education. 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    Patient drinks 1 cups daily. Uses herbal remedies, takes a daily vitamin.   Wears her seatbelt, smoke detector in the home.   Social Determinants of Health   Financial Resource Strain: Low Risk  (09/09/2021)   Overall Financial Resource Strain (CARDIA)    Difficulty of Paying Living Expenses: Not very hard  Food Insecurity: No Food Insecurity (09/09/2021)   Hunger Vital Sign    Worried About Running Out of Food in the Last Year: Never true    Ran Out of Food in the Last Year: Never true  Transportation Needs: No Transportation Needs (09/09/2021)   PRAPARE - Transportation    Lack of  Transportation (Medical): No    Lack of Transportation (Non-Medical): No  Physical Activity: Insufficiently Active (09/09/2021)   Exercise Vital Sign    Days of Exercise per Week: 1 day    Minutes of Exercise per Session: 10 min  Stress: No Stress Concern Present (09/09/2021)   [employer] of Occupational Health - Occupational Stress Questionnaire    Feeling of Stress : Only a little  Social Connections: Socially Integrated (09/09/2021)   Social Connection and Isolation Panel [NHANES]    Frequency of Communication with Friends and Family: More than three times a week    Frequency of Social Gatherings with Friends and Family: Once a week    Attends Religious Services: More than 4 times per year    Active Member of [employer] or Organizations: Yes    Attends Engineer, structural: More than 4 times per year    Marital Status: Married     Family History: The patient's family history includes Arthritis/Rheumatoid in her mother; COPD in her mother; Diabetes in her brother, father, and mother; Hearing loss in her father and mother; Heart disease in her father and mother; Hypertension in her father; Liver cancer in her mother; Mental illness in her  mother. ROS:   Please see the history of present illness.    All 14 point review of systems negative except as described per history of present illness  EKGs/Labs/Other Studies Reviewed:      Recent Labs: 03/18/2022: ALT 54; BUN 15; Creat 0.66; Hemoglobin 14.4; Platelets 314; Potassium 3.6; Sodium 140 06/03/2022: TSH 3.14  Recent Lipid Panel    Component Value Date/Time   CHOL 198 07/15/2021 1405   CHOL 164 04/24/2021 1434   TRIG 111 07/15/2021 1405   HDL 68 07/15/2021 1405   HDL 47 04/24/2021 1434   CHOLHDL 2.9 07/15/2021 1405   VLDL 36.6 08/02/2018 0902   LDLCALC 108 (H) 07/15/2021 1405    Physical Exam:    VS:  BP 124/84 (BP Location: Left Arm, Patient Position: Sitting)   Pulse 72   Ht 5' 2.5" (1.588 m)   Wt 213 lb (96.6 kg)   SpO2 97%   BMI 38.34 kg/m     Wt Readings from Last 3 Encounters:  07/22/22 213 lb (96.6 kg)  07/15/22 216 lb 9.6 oz (98.2 kg)  07/01/22 211 lb (95.7 kg)     GEN:  Well nourished, well developed in no acute distress HEENT: Normal NECK: No JVD; No carotid bruits LYMPHATICS: No lymphadenopathy CARDIAC: RRR, no murmurs, no rubs, no gallops RESPIRATORY:  Clear to auscultation without rales, wheezing or rhonchi  ABDOMEN: Soft, non-tender, non-distended MUSCULOSKELETAL:  No edema; No deformity  SKIN: Warm and dry LOWER EXTREMITIES: no swelling NEUROLOGIC:  Alert and oriented x 3 PSYCHIATRIC:  Normal affect   ASSESSMENT:    1. Paroxysmal atrial fibrillation (HCC)   2. Primary hypertension   3. Dyslipidemia    PLAN:    In order of problems listed above:  Paroxysmal atrial fibrillation, anticoagulated with rare palpitations happening maybe once a month for short period of time.  I recommended to get Apple Watch or Kardia device to record her EKG when she got palpitation. Essential hypertension blood pressure well-controlled continue present management. Dyslipidemia I did review K PN which show LDL 108 HDL 68.  Will continue present  management   Medication Adjustments/Labs and Tests Ordered: Current medicines are reviewed at length with the patient today.  Concerns regarding medicines are outlined above.  No orders of  the defined types were placed in this encounter.  Medication changes: No orders of the defined types were placed in this encounter.   Signed, Georgeanna Lea, MD, [facility] 07/22/2022 4:43 PM    [facility]

## 2022-07-22 NOTE — Patient Instructions (Signed)

## 2022-07-23 DIAGNOSIS — M542 Cervicalgia: Secondary | ICD-10-CM | POA: Diagnosis not present

## 2022-07-23 DIAGNOSIS — M4722 Other spondylosis with radiculopathy, cervical region: Secondary | ICD-10-CM | POA: Diagnosis not present

## 2022-07-31 DIAGNOSIS — M47812 Spondylosis without myelopathy or radiculopathy, cervical region: Secondary | ICD-10-CM | POA: Diagnosis not present

## 2022-08-07 ENCOUNTER — Encounter: Payer: Self-pay | Admitting: Family Medicine

## 2022-08-07 ENCOUNTER — Ambulatory Visit (INDEPENDENT_AMBULATORY_CARE_PROVIDER_SITE_OTHER): Payer: 59 | Admitting: Family Medicine

## 2022-08-07 VITALS — BP 143/75 | HR 74 | Temp 98.8°F | Wt 220.4 lb

## 2022-08-07 DIAGNOSIS — B370 Candidal stomatitis: Secondary | ICD-10-CM | POA: Diagnosis not present

## 2022-08-07 DIAGNOSIS — R21 Rash and other nonspecific skin eruption: Secondary | ICD-10-CM

## 2022-08-07 DIAGNOSIS — B353 Tinea pedis: Secondary | ICD-10-CM | POA: Diagnosis not present

## 2022-08-07 MED ORDER — NYSTATIN 100000 UNIT/ML MT SUSP: 5.0000 mL | Freq: Four times a day (QID) | OROMUCOSAL | 0 refills | Status: AC

## 2022-08-07 MED ORDER — TERBINAFINE HCL 250 MG PO TABS: 250.0000 mg | ORAL_TABLET | Freq: Every day | ORAL | 0 refills | Status: AC

## 2022-08-07 NOTE — Patient Instructions (Signed)
No follow-ups on file.        Great to see you today.  I have refilled the medication(s) we provide.   If labs were collected, we will inform you of lab results once received either by echart message or telephone call.   - echart message- for normal results that have been seen by the patient already.   - telephone call: abnormal results or if patient has not viewed results in their echart.  

## 2022-08-07 NOTE — Progress Notes (Signed)
[name] [name] , 1965/03/18, 57 y.o., female MRN: [id number] Patient Care Team    Relationship Specialty Notifications Start End  Natalia Leatherwood, DO PCP - General Family Medicine  07/07/16   Georgeanna Lea, MD PCP - Cardiology Cardiology  02/09/22   Ob/Gyn, Nestor Ramp    07/08/16   Charlott Rakes, MD Consulting Physician Gastroenterology  07/08/16   York Spaniel, MD (Inactive) Consulting Physician Neurology  07/08/16   Jones Bales, MD Attending Physician Endocrinology  07/08/16    Comment: Pt established with Ailene Ravel, NP at this location. - Integrative med- thyroid  Letta Kocher, MD Consulting Physician Rehabilitation  03/06/19    Comment: Neurosurgeon-spine and scoliosis specialist    Chief Complaint  Patient presents with   Rash    Rash on feet for about a month; denies itching but does burn occasionally when dry. Also has film on tongue     Subjective: [name] [name] is a 57 y.o. Pt presents for an OV with complaints-on feet for about a month sometimes becomes dry and scaly, not painful or very itchy.  Can feel burning sensation at times.  Has had this rash in the past and had been resistant to steroid cream.  Seem to improve a little with clotrimazole but not resolved.  She has been seen by dermatology for condition.  She states it will go away and then I will come back.  Could have possibly been related to COVID but since has returned.  She also has a white layer on her tongue that she has been unable to resolve.  She states she scraped it off but it keeps coming back.     08/07/2022    2:31 PM 07/15/2022   11:06 AM 07/01/2022   11:13 AM 06/03/2022   10:48 AM 07/15/2021    1:54 PM  Depression screen PHQ 2/9  Decreased Interest 1 0 0 0 1  Down, Depressed, Hopeless 1 0 1 0 1  PHQ - 2 Score 2 0 1 0 2  Altered sleeping 2 3 3 3 3   Tired, decreased energy 3 2 1  0 3  Change in appetite 2 0 1 0 3  Feeling bad or failure about yourself  0 0 0 0   Trouble  concentrating 2 1 0 3 3  Moving slowly or fidgety/restless 1 0 0 0 0  Suicidal thoughts 0 0 0 0 0  PHQ-9 Score 12 6 6 6 14     Allergies  Allergen Reactions   Bee Venom Swelling and Anaphylaxis   Yellow Jacket Venom Swelling   Biaxin [Clarithromycin] Diarrhea and Rash   Robaxin [Methocarbamol] Swelling    Feet, chest pain, headache   Abilify [Aripiprazole] Other (See Comments)    MOOD SWING   Buspar [Buspirone] Diarrhea   Hydrochlorothiazide Rash   Lisinopril Cough   Maxzide [Triamterene-Hctz] Rash   Phentermine Other (See Comments)    MOOD CHANGE    Quetiapine Other (See Comments)    Felt funny MOOD SWINGS   Seroquel [Quetiapine Fumarate] Other (See Comments)    MOOD SWINGS   Social History   Social History Narrative   Patient is married Gery Pray)  2 children Molly Maduro and Lyndon)   Patient is right handed.   Patient has a college education. 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    Patient drinks 1 cups daily. Uses herbal remedies, takes a daily vitamin.   Wears her seatbelt, smoke detector in the home.  Past Medical History:  Diagnosis Date   AC joint derangement 03/26/2021   Acute bronchitis due to COVID-19 virus 09/06/2020   Anxiety 01/12/2013   Arthralgia 07/08/2016   Asthma    Atrial fibrillation (HCC) 12/06/2018   B12 deficiency 01/31/2018   Chicken pox    Chronic insomnia 11/24/2016   Clostridium difficile infection 04/02/2014, 02/06/2014   Complex tear of medial meniscus of left knee as current injury 12/01/2018   Complex tear of medial meniscus of left knee as current injury 12/01/2018   Complication of anesthesia    DDD (degenerative disc disease), lumbar 09/09/2018   Depression    Diabetes mellitus without complication (HCC) 12/07/2018   Dyslipidemia    Dysrhythmia    Elevated hemoglobin A1c 07/08/2016   Estrogen deficiency 12/06/2018   Foraminal stenosis of lumbar region 09/09/2018   Hair loss 07/20/2017   History of lumbar fusion 09/09/2018   Hormone  replacement therapy (HRT) 07/20/2017   HTN (hypertension)    Hypertension 07/08/2016   Hypokalemia 12/06/2018   Hypothyroidism    Left medial tibial plateau fracture 11/10/2018   Lumbar radiculopathy 09/09/2018   Referred to [facility] 09/09/2018   Migraine    Mild memory disturbance 11/13/2013   Morbid obesity (HCC) 07/08/2016   Multiple allergies    Obesity    PONV (postoperative nausea and vomiting)    Rash associated with COVID-19 10/02/2020   Restless legs syndrome (RLS) 01/12/2013   Follows with Dr. Anne Hahn who prescribes Requip and gabapentin   RLS (restless legs syndrome)    Scapular dysfunction 04/02/2021   Stress fracture of rib 04/15/2021   Vitamin D deficiency 12/06/2018   Wears glasses 07/08/2016   Past Surgical History:  Procedure Laterality Date   BREAST BIOPSY  1610,9604   x 2 benign lesion   CERVICAL DISCECTOMY  2003   CERVICAL FUSION  2010   x2- C4-C6   Hand fracture surgery     LUMBAR FUSION  2016   TOTAL KNEE ARTHROPLASTY Left 03/31/2022   Procedure: LEFT TOTAL KNEE ARTHROPLASTY;  Surgeon: Marcene Corning, MD;  Location: [facility];  Service: Orthopedics;  Laterality: Left;   VAGINAL HYSTERECTOMY  2009   Family History  Problem Relation Age of Onset   Hypertension Father    Diabetes Father    Heart disease Father    Hearing loss Father    Diabetes Brother    Liver cancer Mother    COPD Mother    Mental illness Mother    Diabetes Mother    Hearing loss Mother    Heart disease Mother    Arthritis/Rheumatoid Mother    Allergies as of 08/07/2022       Reactions   Bee Venom Swelling, Anaphylaxis   Yellow Jacket Venom Swelling   Biaxin [clarithromycin] Diarrhea, Rash   Robaxin [methocarbamol] Swelling   Feet, chest pain, headache   Abilify [aripiprazole] Other (See Comments)   MOOD SWING   Buspar [buspirone] Diarrhea   Hydrochlorothiazide Rash   Lisinopril Cough   Maxzide [triamterene-hctz] Rash   Phentermine Other (See Comments)   MOOD CHANGE    Quetiapine Other (See Comments)   Felt funny MOOD SWINGS   Seroquel [quetiapine Fumarate] Other (See Comments)   MOOD SWINGS        Medication List        Accurate as of August 07, 2022  4:05 PM. If you have any questions, ask your nurse or doctor.          STOP taking these  medications    tiZANidine 4 MG tablet Commonly known as: Zanaflex Stopped by: Felix Pacini, DO       TAKE these medications    albuterol 108 (90 Base) MCG/ACT inhaler Commonly known as: VENTOLIN HFA Inhale 2 puffs into the lungs every 6 (six) hours as needed for wheezing or shortness of breath.   buPROPion 300 MG 24 hr tablet Commonly known as: WELLBUTRIN XL Take 1 tablet (300 mg total) by mouth daily.   DULoxetine 60 MG capsule Commonly known as: CYMBALTA Take 1 capsule (60 mg total) by mouth daily.   Eliquis 5 MG Tabs tablet Generic drug: apixaban TAKE 1 TABLET BY MOUTH TWICE A DAY What changed: how much to take   levothyroxine 150 MCG tablet Commonly known as: SYNTHROID Take 1 tablet (150 mcg total) by mouth daily.   magnesium oxide 400 MG tablet Commonly known as: MAG-OX Take 1 tablet (400 mg total) by mouth daily.   metaxalone 800 MG tablet Commonly known as: SKELAXIN Take 1 tablet (800 mg total) by mouth 3 (three) times daily as needed for muscle spasms.   metoprolol succinate 25 MG 24 hr tablet Commonly known as: TOPROL-XL Take 0.5 tablets (12.5 mg total) by mouth daily.   nystatin 100000 UNIT/ML suspension Commonly known as: MYCOSTATIN Take 5 mLs (500,000 Units total) by mouth 4 (four) times daily for 5 days. 5 ml swish for 30 seconds and then spit out, QID, do not swallow. Started by: Felix Pacini, DO   pregabalin 150 MG capsule Commonly known as: Lyrica Take 1 capsule (150 mg total) by mouth 2 (two) times daily.   rOPINIRole 2 MG tablet Commonly known as: REQUIP Take 1/2 tablet at lunch and 1/2 tablet at bed time. What changed:  how much to take how to take  this when to take this   rOPINIRole 4 MG 24 hr tablet Commonly known as: REQUIP XL Take 1 tablet (4 mg total) by mouth daily. What changed: Another medication with the same name was changed. Make sure you understand how and when to take each.   telmisartan 80 MG tablet Commonly known as: Micardis Take 1 tablet (80 mg total) by mouth daily.   terbinafine 250 MG tablet Commonly known as: LAMISIL Take 1 tablet (250 mg total) by mouth daily for 14 days. Started by: Felix Pacini, DO   Vitamin D 125 MCG (5000 UT) Caps Take 5,000 Units by mouth daily.        All past medical history, surgical history, allergies, family history, immunizations andmedications were updated in the EMR today and reviewed under the history and medication portions of their EMR.     ROS Negative, with the exception of above mentioned in HPI   Objective:  BP (!) 143/75   Pulse 74   Temp 98.8 F (37.1 C)   Wt 220 lb 6.4 oz (100 kg)   SpO2 96%   BMI 39.67 kg/m  Body mass index is 39.67 kg/m. Physical Exam Vitals and nursing note reviewed.  Constitutional:      General: She is not in acute distress.    Appearance: Normal appearance. She is normal weight. She is not ill-appearing or toxic-appearing.  HENT:     Head: Normocephalic and atraumatic.     Mouth/Throat:     Mouth: Mucous membranes are moist.     Pharynx: No oropharyngeal exudate or posterior oropharyngeal erythema.     Comments: Mild thrush of tongue on exam. Eyes:     General: No  scleral icterus.       Right eye: No discharge.        Left eye: No discharge.     Extraocular Movements: Extraocular movements intact.     Conjunctiva/sclera: Conjunctivae normal.     Pupils: Pupils are equal, round, and reactive to light.  Skin:    Findings: Rash (Bilateral feet with erythema macular papular plaques over/ between to the toes, affecting most of the forefoot only) present.  Neurological:     Mental Status: She is alert and oriented to  person, place, and time. Mental status is at baseline.     Motor: No weakness.     Coordination: Coordination normal.     Gait: Gait normal.  Psychiatric:        Mood and Affect: Mood normal.        Behavior: Behavior normal.        Thought Content: Thought content normal.        Judgment: Judgment normal.      No results found. No results found. No results found for this or any previous visit (from the past 24 hour(s)).  Assessment/Plan: [name] [name] is a 57 y.o. female present for OV for  Thrush Statin swish 4 times daily x 5 days Replace toothbrush  Rash/Tinea pedis of both feet Rash possible fungal in nature, but has been resistant to multiple types of creams in the past.  Dermatology reportedly had done a scraping and said it was fungal. Trial of terbinafine 250 mg daily x 2 weeks Follow-up in 2 weeks if rash is not resolved.  Could consider different antifungal at that time if necessary Reviewed expectations re: course of current medical issues. Discussed self-management of symptoms. Outlined signs and symptoms indicating need for more acute intervention. Patient verbalized understanding and all questions were answered. Patient received an After-Visit Summary.  Return if symptoms worsen or fail to improve.   No orders of the defined types were placed in this encounter.  Meds ordered this encounter  Medications   terbinafine (LAMISIL) 250 MG tablet    Sig: Take 1 tablet (250 mg total) by mouth daily for 14 days.    Dispense:  14 tablet    Refill:  0   nystatin (MYCOSTATIN) 100000 UNIT/ML suspension    Sig: Take 5 mLs (500,000 Units total) by mouth 4 (four) times daily for 5 days. 5 ml swish for 30 seconds and then spit out, QID, do not swallow.    Dispense:  100 mL    Refill:  0   Referral Orders  No referral(s) requested today     Note is dictated utilizing voice recognition software. Although note has been proof read prior to signing, occasional  typographical errors still can be missed. If any questions arise, please do not hesitate to call for verification.   electronically signed by:  Felix Pacini, DO  [facility] - [place]

## 2022-08-17 DIAGNOSIS — M47812 Spondylosis without myelopathy or radiculopathy, cervical region: Secondary | ICD-10-CM | POA: Diagnosis not present

## 2022-08-26 ENCOUNTER — Telehealth (INDEPENDENT_AMBULATORY_CARE_PROVIDER_SITE_OTHER): Payer: 59 | Admitting: Neurology

## 2022-08-26 DIAGNOSIS — G2581 Restless legs syndrome: Secondary | ICD-10-CM | POA: Diagnosis not present

## 2022-08-26 MED ORDER — ROPINIROLE HCL 2 MG PO TABS: ORAL_TABLET | ORAL | 1 refills | Status: DC

## 2022-08-26 MED ORDER — ROPINIROLE HCL ER 4 MG PO TB24: 4.0000 mg | ORAL_TABLET | Freq: Every day | ORAL | 1 refills | Status: DC

## 2022-08-26 NOTE — Patient Instructions (Signed)
Great to see you today I am glad you are doing so well! Continue current regimen of Requip, I sent refills today Please follow-up with your PCP for refills going forward, follow up here for new or worsening symptoms. Thanks!!

## 2022-08-26 NOTE — Progress Notes (Signed)
   Virtual Visit via Video Note  I connected with [name] [name] on 08/26/22 at  3:15 PM EDT by a video enabled telemedicine application and verified that I am speaking with the correct person using two identifiers.  Location: Patient: in the car  Provider: in the office    I discussed the limitations of evaluation and management by telemedicine and the availability of in person appointments. The patient expressed understanding and agreed to proceed.  History of Present Illness: Today August 26, 2022 SS:  Via VV, doing great with RLS.  Remains on Requip XL 4 mg in the AM, Requip 2 mg 1/2 tablet twice daily.  No longer on gabapentin.  PCP has started Lyrica 150 mg twice daily for neck pain, but reports it has also helped RLS symptoms.  She is pleased with her RLS symptoms.  Feels they are well-managed.  Has no new issues or concerns. Does not not to reduce dose of Requip.   08/21/21 SS: [name] [name] is here today for follow-up. Iron, TIBC, Ferritin was normal, ferritin was 136 on 07/15/21.  Her dose of gabapentin was increased from 300 mg twice a day to 600 mg twice a day by orthopedics to help with neck pain.  It was helpful.  Wishes to continue this dose.  She has been referred for orthopedic surgical consult for cervical spine.  Has had some knee issues, had knee x-rays today.  Her RLS symptoms are under good control with gabapentin, Requip 2 mg, 1/2 tablet at lunch, 1/2 tablet at bedtime. Requip XL 4 mg in the evening.    Observations/Objective: Via video visit, is alert and oriented, speech is clear and concise, noted to be in her car driving  Assessment and Plan: 1.  Restless leg syndrome  -Under good control -Can continue current regimen Requip XL 4 mg daily AM, Requip 2 mg 1/2 tablet twice daily, also on Lyrica from PCP -I have sent refills for Requip today, we discussed following up with PCP for refills going forward, return here for new or worsening symptoms  Meds ordered this encounter   Medications   rOPINIRole (REQUIP) 2 MG tablet    Sig: Take 1/2 tablet at lunch and 1/2 tablet at bed time.    Dispense:  90 tablet    Refill:  1   rOPINIRole (REQUIP XL) 4 MG 24 hr tablet    Sig: Take 1 tablet (4 mg total) by mouth daily.    Dispense:  90 tablet    Refill:  1    Please consider 90 day supplies to promote better adherence   Follow Up Instructions: PRN   I discussed the assessment and treatment plan with the patient. The patient was provided an opportunity to ask questions and all were answered. The patient agreed with the plan and demonstrated an understanding of the instructions.   The patient was advised to call back or seek an in-person evaluation if the symptoms worsen or if the condition fails to improve as anticipated.  Otila Kluver, DNP  [facility] [address], [address] [place], [place] [address] [phone]

## 2022-10-02 ENCOUNTER — Ambulatory Visit (HOSPITAL_COMMUNITY)
Admission: RE | Admit: 2022-10-02 | Discharge: 2022-10-02 | Disposition: A | Discharge status: Home / Self Care | Payer: 59 | Source: Ambulatory Visit | Attending: Family Medicine | Admitting: Family Medicine

## 2022-10-02 ENCOUNTER — Telehealth: Payer: Self-pay | Admitting: Family Medicine

## 2022-10-02 ENCOUNTER — Ambulatory Visit (INDEPENDENT_AMBULATORY_CARE_PROVIDER_SITE_OTHER): Payer: 59 | Admitting: Family Medicine

## 2022-10-02 ENCOUNTER — Encounter: Payer: Self-pay | Admitting: Family Medicine

## 2022-10-02 VITALS — BP 118/76 | HR 72 | Temp 98.2°F | Wt 194.0 lb

## 2022-10-02 DIAGNOSIS — R4702 Dysphasia: Secondary | ICD-10-CM | POA: Insufficient documentation

## 2022-10-02 DIAGNOSIS — R42 Dizziness and giddiness: Secondary | ICD-10-CM | POA: Diagnosis not present

## 2022-10-02 DIAGNOSIS — G4452 New daily persistent headache (NDPH): Secondary | ICD-10-CM

## 2022-10-02 DIAGNOSIS — I480 Paroxysmal atrial fibrillation: Secondary | ICD-10-CM | POA: Diagnosis not present

## 2022-10-02 DIAGNOSIS — D6869 Other thrombophilia: Secondary | ICD-10-CM

## 2022-10-02 DIAGNOSIS — M791 Myalgia, unspecified site: Secondary | ICD-10-CM

## 2022-10-02 DIAGNOSIS — R29818 Other symptoms and signs involving the nervous system: Secondary | ICD-10-CM | POA: Diagnosis not present

## 2022-10-02 DIAGNOSIS — R2689 Other abnormalities of gait and mobility: Secondary | ICD-10-CM | POA: Diagnosis not present

## 2022-10-02 LAB — COMPREHENSIVE METABOLIC PANEL
ALT: 42 U/L — ABNORMAL HIGH (ref 0–35)
AST: 23 U/L (ref 0–37)
Albumin: 4.2 g/dL (ref 3.5–5.2)
Alkaline Phosphatase: 118 U/L — ABNORMAL HIGH (ref 39–117)
BUN: 20 mg/dL (ref 6–23)
CO2: 30 mEq/L (ref 19–32)
Calcium: 9.6 mg/dL (ref 8.4–10.5)
Chloride: 103 mEq/L (ref 96–112)
Creatinine, Ser: 0.57 mg/dL (ref 0.40–1.20)
GFR: 101.69 mL/min (ref >60.00)
Glucose, Bld: 134 mg/dL — ABNORMAL HIGH (ref 70–99)
Potassium: 4.6 mEq/L (ref 3.5–5.1)
Sodium: 140 mEq/L (ref 135–145)
Total Bilirubin: 0.3 mg/dL (ref 0.2–1.2)
Total Protein: 6.8 g/dL (ref 6.0–8.3)

## 2022-10-02 LAB — CBC WITH DIFFERENTIAL/PLATELET
Basophils Absolute: 0.1 10*3/uL (ref 0.0–0.1)
Basophils Relative: 1 % (ref 0.0–3.0)
Eosinophils Absolute: 0.2 10*3/uL (ref 0.0–0.7)
Eosinophils Relative: 2.5 % (ref 0.0–5.0)
HCT: 46.2 % — ABNORMAL HIGH (ref 36.0–46.0)
Hemoglobin: 15 g/dL (ref 12.0–15.0)
Lymphocytes Relative: 29.1 % (ref 12.0–46.0)
Lymphs Abs: 2 10*3/uL (ref 0.7–4.0)
MCHC: 32.6 g/dL (ref 30.0–36.0)
MCV: 91.3 fl (ref 78.0–100.0)
Monocytes Absolute: 0.5 10*3/uL (ref 0.1–1.0)
Monocytes Relative: 7 % (ref 3.0–12.0)
Neutro Abs: 4.1 10*3/uL (ref 1.4–7.7)
Neutrophils Relative %: 60.4 % (ref 43.0–77.0)
Platelets: 282 10*3/uL (ref 150.0–400.0)
RBC: 5.05 Mil/uL (ref 3.87–5.11)
RDW: 14 % (ref 11.5–15.5)
WBC: 6.7 10*3/uL (ref 4.0–10.5)

## 2022-10-02 LAB — CK: Total CK: 84 U/L (ref 7–177)

## 2022-10-02 LAB — SEDIMENTATION RATE: Sed Rate: 20 mm/hr (ref 0–30)

## 2022-10-02 MED ORDER — GADOBUTROL 1 MMOL/ML IV SOLN
8.0000 mL | Freq: Once | INTRAVENOUS | Status: AC | PRN
  Administered 2022-10-02: 8 mL via INTRAVENOUS

## 2022-10-02 NOTE — Progress Notes (Signed)
[name] [name] , [birth year], 57 y.o., female MRN: [id number] Patient Care Team    Relationship Specialty Notifications Start End  Natalia Leatherwood, DO PCP - General Family Medicine  07/07/16   Georgeanna Lea, MD PCP - Cardiology Cardiology  02/09/22   Ob/Gyn, Nestor Ramp    07/08/16   Charlott Rakes, MD Consulting Physician Gastroenterology  07/08/16   York Spaniel, MD (Inactive) Consulting Physician Neurology  07/08/16   Jones Bales, MD Attending Physician Endocrinology  07/08/16    Comment: Pt established with Ailene Ravel, NP at this location. - Integrative med- thyroid  Letta Kocher, MD Consulting Physician Rehabilitation  03/06/19    Comment: Neurosurgeon-spine and scoliosis specialist    Chief Complaint  Patient presents with   Allergic Reaction    C/o body pain all over body, hurts to do ADL. Also falls asleep more during day activities.     Subjective: [name] [name] is a 57 y.o. Pt presents for an OV with complaints of "allergic reaction"  She believes the reaction came from penicillin because Reaction occurred within 15 minutes after taking penicillin, prior to a dental procedure, while patient was at home on 09/22/2022.  Patient reports she was in the shower when symptoms abruptly started and she fell back against the knobs to her shower hitting her back.  She denies syncope, shortness of breath or chest pain.  Patient reaction consist of anxiety, hypertension, unbalance, dizziness, nausea, vomit, dysphagia and leaning while walking. She reports she was walking zig zag, vision feels like a kaleidescope Speech deficit returned to normal in 4 minutes. She has felt exhausted and falling asleep inappropriately throughout the day since event.  Patient was not evaluated for event.  Patient has a history of atrial fibrillation and prescribed Eliquis to twice daily as well as metoprolol.  Eliquis not held prior to procedure. Patient has had penicillin and amoxicillin  on multiple occasions in the past without reaction.  Since event she has felt occasional dizziness, and feeling wobbly every day. She reports her muscles ache all over. She is dropping objects and falling asleep easily throughout the day.  She is prescribed many medications that would require renal dosing if there has been a change.      10/02/2022   10:14 AM 08/07/2022    2:31 PM 07/15/2022   11:06 AM 07/01/2022   11:13 AM 06/03/2022   10:48 AM  Depression screen PHQ 2/9  Decreased Interest 1 1 0 0 0  Down, Depressed, Hopeless 0 1 0 1 0  PHQ - 2 Score 1 2 0 1 0  Altered sleeping 3 2 3 3 3   Tired, decreased energy 3 3 2 1  0  Change in appetite 3 2 0 1 0  Feeling bad or failure about yourself  1 0 0 0 0  Trouble concentrating 3 2 1  0 3  Moving slowly or fidgety/restless 3 1 0 0 0  Suicidal thoughts 0 0 0 0 0  PHQ-9 Score 17 12 6 6 6     Allergies  Allergen Reactions   Bee Venom Swelling and Anaphylaxis   Yellow Jacket Venom Swelling   Biaxin [Clarithromycin] Diarrhea and Rash   Robaxin [Methocarbamol] Swelling    Feet, chest pain, headache   Abilify [Aripiprazole] Other (See Comments)    MOOD SWING   Buspar [Buspirone] Diarrhea   Hydrochlorothiazide Rash   Lisinopril Cough   Maxzide [Triamterene-Hctz] Rash   Penicillins Anxiety, Other (See  Comments) and Hypertension    Nausea, vomiting, dizziness-possible allergy versus neurological event   Phentermine Other (See Comments)    MOOD CHANGE    Quetiapine Other (See Comments)    Felt funny MOOD SWINGS   Seroquel [Quetiapine Fumarate] Other (See Comments)    MOOD SWINGS   Social History   Social History Narrative   Patient is married Gery Pray)  2 children Molly [place] and [place])   Patient is right handed.   Patient has a college education. 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    Patient drinks 1 cups daily. Uses herbal remedies, takes a daily vitamin.   Wears her seatbelt, smoke detector in the home.   Past Medical History:  Diagnosis Date    AC joint derangement 03/26/2021   Acute bronchitis due to COVID-19 virus 09/06/2020   Anxiety 01/12/2013   Arthralgia 07/08/2016   Asthma    Atrial fibrillation (HCC) 12/06/2018   B12 deficiency 01/31/2018   Chicken pox    Chronic insomnia 11/24/2016   Clostridium difficile infection 04/02/2014, 02/06/2014   Complex tear of medial meniscus of left knee as current injury 12/01/2018   Complex tear of medial meniscus of left knee as current injury 12/01/2018   Complication of anesthesia    DDD (degenerative disc disease), lumbar 09/09/2018   Depression    Diabetes mellitus without complication (HCC) 12/07/2018   Dyslipidemia    Dysrhythmia    Elevated hemoglobin A1c 07/08/2016   Estrogen deficiency 12/06/2018   Foraminal stenosis of lumbar region 09/09/2018   Hair loss 07/20/2017   History of lumbar fusion 09/09/2018   Hormone replacement therapy (HRT) 07/20/2017   HTN (hypertension)    Hypertension 07/08/2016   Hypokalemia 12/06/2018   Hypothyroidism    Left medial tibial plateau fracture 11/10/2018   Lumbar radiculopathy 09/09/2018   Referred to [facility] 09/09/2018   Migraine    Mild memory disturbance 11/13/2013   Morbid obesity (HCC) 07/08/2016   Multiple allergies    Obesity    PONV (postoperative nausea and vomiting)    Rash associated with COVID-19 10/02/2020   Restless legs syndrome (RLS) 01/12/2013   Follows with Dr. Anne Hahn who prescribes Requip and gabapentin   RLS (restless legs syndrome)    Scapular dysfunction 04/02/2021   Stress fracture of rib 04/15/2021   Vitamin D deficiency 12/06/2018   Wears glasses 07/08/2016   Past Surgical History:  Procedure Laterality Date   BREAST BIOPSY  0981,1914   x 2 benign lesion   CERVICAL DISCECTOMY  2003   CERVICAL FUSION  2010   x2- C4-C6   Hand fracture surgery     LUMBAR FUSION  2016   TOTAL KNEE ARTHROPLASTY Left 03/31/2022   Procedure: LEFT TOTAL KNEE ARTHROPLASTY;  Surgeon: Marcene Corning, MD;  Location: [facility];   Service: Orthopedics;  Laterality: Left;   VAGINAL HYSTERECTOMY  2009   Family History  Problem Relation Age of Onset   Hypertension Father    Diabetes Father    Heart disease Father    Hearing loss Father    Diabetes Brother    Liver cancer Mother    COPD Mother    Mental illness Mother    Diabetes Mother    Hearing loss Mother    Heart disease Mother    Arthritis/Rheumatoid Mother    Allergies as of 10/02/2022       Reactions   Bee Venom Swelling, Anaphylaxis   Yellow Jacket Venom Swelling   Biaxin [clarithromycin] Diarrhea, Rash   Robaxin [methocarbamol]  Swelling   Feet, chest pain, headache   Abilify [aripiprazole] Other (See Comments)   MOOD SWING   Buspar [buspirone] Diarrhea   Hydrochlorothiazide Rash   Lisinopril Cough   Maxzide [triamterene-hctz] Rash   Penicillins Anxiety, Other (See Comments), Hypertension   Nausea, vomiting, dizziness-possible allergy versus neurological event   Phentermine Other (See Comments)   MOOD CHANGE   Quetiapine Other (See Comments)   Felt funny MOOD SWINGS   Seroquel [quetiapine Fumarate] Other (See Comments)   MOOD SWINGS        Medication List        Accurate as of October 02, 2022 11:28 AM. If you have any questions, ask your nurse or doctor.          albuterol 108 (90 Base) MCG/ACT inhaler Commonly known as: VENTOLIN HFA Inhale 2 puffs into the lungs every 6 (six) hours as needed for wheezing or shortness of breath.   buPROPion 300 MG 24 hr tablet Commonly known as: WELLBUTRIN XL Take 1 tablet (300 mg total) by mouth daily.   DULoxetine 60 MG capsule Commonly known as: CYMBALTA Take 1 capsule (60 mg total) by mouth daily.   Eliquis 5 MG Tabs tablet Generic drug: apixaban TAKE 1 TABLET BY MOUTH TWICE A DAY What changed: how much to take   levothyroxine 150 MCG tablet Commonly known as: SYNTHROID Take 1 tablet (150 mcg total) by mouth daily.   magnesium oxide 400 MG tablet Commonly known as: MAG-OX Take  1 tablet (400 mg total) by mouth daily.   metaxalone 800 MG tablet Commonly known as: SKELAXIN Take 1 tablet (800 mg total) by mouth 3 (three) times daily as needed for muscle spasms.   metoprolol succinate 25 MG 24 hr tablet Commonly known as: TOPROL-XL Take 0.5 tablets (12.5 mg total) by mouth daily.   pregabalin 150 MG capsule Commonly known as: Lyrica Take 1 capsule (150 mg total) by mouth 2 (two) times daily.   progesterone 100 MG capsule Commonly known as: PROMETRIUM Take 100 mg by mouth at bedtime.   rOPINIRole 2 MG tablet Commonly known as: REQUIP Take 1/2 tablet at lunch and 1/2 tablet at bed time.   rOPINIRole 4 MG 24 hr tablet Commonly known as: REQUIP XL Take 1 tablet (4 mg total) by mouth daily.   telmisartan 80 MG tablet Commonly known as: Micardis Take 1 tablet (80 mg total) by mouth daily.   tiZANidine 4 MG tablet Commonly known as: ZANAFLEX Take 4 mg by mouth every 8 (eight) hours as needed.   Vitamin D 125 MCG (5000 UT) Caps Take 5,000 Units by mouth daily.        All past medical history, surgical history, allergies, family history, immunizations andmedications were updated in the EMR today and reviewed under the history and medication portions of their EMR.     ROS Negative, with the exception of above mentioned in HPI   Objective:  BP 118/76   Pulse 72   Temp 98.2 F (36.8 C)   Wt 194 lb (88 kg)   SpO2 96%   BMI 34.92 kg/m  Body mass index is 34.92 kg/m. Physical Exam Vitals and nursing note reviewed.  Constitutional:      General: She is not in acute distress.    Appearance: Normal appearance. She is normal weight. She is not ill-appearing, toxic-appearing or diaphoretic.     Comments: Smiling and laughing today  HENT:     Head: Normocephalic and atraumatic.  Right Ear: Tympanic membrane and ear canal normal.     Left Ear: Tympanic membrane and ear canal normal.     Mouth/Throat:     Mouth: Mucous membranes are moist.   Eyes:     General: Visual field deficit present. No scleral icterus.       Right eye: No discharge.        Left eye: No discharge.     Extraocular Movements: Extraocular movements intact.     Conjunctiva/sclera: Conjunctivae normal.     Pupils: Pupils are equal, round, and reactive to light.  Cardiovascular:     Rate and Rhythm: Normal rate and regular rhythm.     Heart sounds: No murmur heard. Pulmonary:     Effort: Pulmonary effort is normal. No respiratory distress.     Breath sounds: Normal breath sounds. No wheezing or rales.  Musculoskeletal:     Cervical back: Neck supple.     Right lower leg: No edema.     Left lower leg: No edema.  Skin:    Findings: No rash.  Neurological:     Mental Status: She is alert and oriented to person, place, and time.     Cranial Nerves: Facial asymmetry present.     Motor: Weakness present. No pronator drift.     Coordination: Coordination abnormal. Finger-Nose-Finger Test abnormal.     Gait: Gait abnormal.     Comments: Weak grip bilaterally.  Shoulder shrug weak. Possible mild facial asymmetry present. Left lateral visual field abnormal/absent.   Psychiatric:        Mood and Affect: Mood normal.        Behavior: Behavior normal.        Thought Content: Thought content normal.        Judgment: Judgment normal.    No results found. No results found. No results found for this or any previous visit (from the past 24 hour(s)).  Assessment/Plan: [name] [name] is a 57 y.o. female present for OV for  Balance problem/Headache, new daily persistent (NDPH)/dizziness/dysphasia Concerned pt may have had a stroke or neurological event, on 7/23, 10 days ago. Exam today identified neurological deficits.  - MR BRAIN W WO CONTRAST; Future - CBC w/Diff - Comp Met (CMET) - Sedimentation rate - CK Pt understands we we will attempt to move quickly with outpatient workup, however if her symptoms are worsening she needs to go to ED immediately.   Referral to her neuro placed as urgent  Paroxysmal atrial fibrillation (HCC)/ Acquired thrombophilia (HCC)-eliquis Concern for neurological event had occurred 09/22/2022 Continue cardiac medications and eliquis - MR BRAIN W WO CONTRAST; Future - CBC w/Diff - Comp Met (CMET) - Sedimentation rate - CK  Myalgia If aki/rhabdo present, medications will need to be renally dosed and pt would need admission for treatment.  AKI/rhabdo could be because of her myalgia and her sedation secondary to her chronic medication - CBC w/Diff - Comp Met (CMET) - Sedimentation rate - CK  Reviewed expectations re: course of current medical issues. Discussed self-management of symptoms. Outlined signs and symptoms indicating need for more acute intervention. Patient verbalized understanding and all questions were answered. Patient received an After-Visit Summary.  65 minutes spent during patient encounter on date of service working out possible emergent neurological event.  Orders Placed This Encounter  Procedures   MR BRAIN W WO CONTRAST   CBC w/Diff   Comp Met (CMET)   Sedimentation rate   CK   Ambulatory referral to Neurology  No orders of the defined types were placed in this encounter.  Referral Orders         Ambulatory referral to Neurology       Note is dictated utilizing voice recognition software. Although note has been proof read prior to signing, occasional typographical errors still can be missed. If any questions arise, please do not hesitate to call for verification.   electronically signed by:  Felix Pacini, DO  [facility] - [place]

## 2022-10-02 NOTE — Telephone Encounter (Signed)
Patient is aware of results and had no additional concerns or questions

## 2022-10-02 NOTE — Telephone Encounter (Signed)
LM for pt to return call to discuss.  

## 2022-10-02 NOTE — Patient Instructions (Signed)

## 2022-10-02 NOTE — Telephone Encounter (Signed)
noted 

## 2022-10-02 NOTE — Telephone Encounter (Signed)
Please call patient: Her blood cell counts are normal, no signs of infection or anemia. Her kidney function is normal and so are her electrolytes. Her inflammatory marker and CK (muscle enzyme) are both normal. Liver enzyme is stable. She does have very mild elevation in the alkaline phosphatase at 118, normal is less than 117.  This is not diagnostic for anything particular for her symptoms because.   Awaiting MRI result. I also placed referral back to her neurologist urgently, so she should be hearing from them soon.

## 2022-10-05 ENCOUNTER — Telehealth: Payer: Self-pay | Admitting: Family Medicine

## 2022-10-05 NOTE — Telephone Encounter (Signed)
Please call patient: -With her labs normal and her MRI is also reassuring, next step would be evaluated by neurology.  I had placed that referral for her urgently back to her neurology team.  Would encourage her to call them today as well and make sure they have received and to make an appointment. If she is still having visual field changes, would also encourage her to be evaluated by her eye doctor immediately.  She does not have one currently, we can place a referral to ophthalmology for her.  I currently do not have an answer for her new onset of symptoms.

## 2022-10-05 NOTE — Telephone Encounter (Signed)
Spoke with patient regarding results/recommendations.  

## 2022-10-08 ENCOUNTER — Ambulatory Visit (INDEPENDENT_AMBULATORY_CARE_PROVIDER_SITE_OTHER): Payer: 59 | Admitting: Neurology

## 2022-10-08 ENCOUNTER — Encounter: Payer: Self-pay | Admitting: Neurology

## 2022-10-08 VITALS — BP 128/82 | HR 69 | Ht 62.0 in | Wt 239.0 lb

## 2022-10-08 DIAGNOSIS — G4710 Hypersomnia, unspecified: Secondary | ICD-10-CM | POA: Diagnosis not present

## 2022-10-08 DIAGNOSIS — R42 Dizziness and giddiness: Secondary | ICD-10-CM

## 2022-10-08 DIAGNOSIS — G2581 Restless legs syndrome: Secondary | ICD-10-CM | POA: Diagnosis not present

## 2022-10-08 NOTE — Progress Notes (Signed)
Chief Complaint  Patient presents with   New Patient (Initial Visit)    Rm 15, NP, reaction to PCN on 09/22/22. Symptoms of dysphasia, balance and headaches started after that. Dizziness has resolved      ASSESSMENT AND PLAN  [name] [name] is a 57 y.o. female   Transient dizziness, nausea,  Symptoms suggest a presyncope episode,  MRI of the brain showed no significant abnormality,  Suggested to continue to document event, check blood pressure regularly,  High risk for obstructive sleep apnea  Loud snoring, poor sleep quality, excessive daytime sleepiness, obesity, narrow oropharyngeal space,  Refer to sleep study  DIAGNOSTIC DATA (LABS, IMAGING, TESTING) - I reviewed patient records, labs, notes, testing and imaging myself where available.   MEDICAL HISTORY:  [name] [name], is a 57 year old female seen in request by her primary care physician Dr. Claiborne Billings, Renee A, for evaluation of dizziness episode, excessive sleepiness, initial evaluation October 08, 2022  I reviewed and summarized the referring note. PMHX HTN Restless leg syndrome Hypothyroidism Atrial fibrillation Depression Cervical decompression surgery x2, left cervical radiculopathy Lumbar decompression  Left knee replacement in Jan 2024  She was given penicillin prior to her dental procedure on September 22, 2022, stepping out of the car to the dentist office, she felt unsteady, dizziness, during a dental cleaning, she began to perspire profusely, also had nausea, lightheaded, confusion, lasting for 20 minutes, reported blood pressure 160/90 heart rate was in 80s, her husband has to be called to take her back home, she rested in the afternoon, symptoms gradually improved, she contributed to allergic reaction to penicillin, denies rash,  For that reason, she had MRI of the brain with without contrast October 02, 2022, personally reviewed the film, that was normal  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  PHYSICAL EXAMNIATION:  Gen: NAD, conversant, well nourised, well groomed                     Cardiovascular: Regular rate rhythm, no peripheral edema, warm, nontender. Eyes: Conjunctivae clear without exudates or hemorrhage Neck: Supple, no carotid bruits. Pulmonary: Clear to auscultation bilaterally   NEUROLOGICAL EXAM:  MENTAL STATUS: Speech/cognition: Awake, alert, oriented to history taking and casual conversation CRANIAL NERVES: CN II: Visual fields are full to confrontation. Pupils are round equal and briskly reactive to light. CN III, IV, VI: extraocular movement are normal. No ptosis. CN V: Facial sensation is intact to light touch CN VII: Face is symmetric with normal eye closure  CN VIII: Hearing is normal to causal conversation. CN IX, X: Phonation is normal. CN XI: Head turning and shoulder shrug are intact  MOTOR: There is no pronator drift of out-stretched arms. Muscle bulk and tone are normal. Muscle strength is normal.  REFLEXES: Reflexes are 1 and symmetric at the biceps, triceps, knees, and ankles. Plantar responses are flexor.  SENSORY: Intact to light touch, pinprick and vibratory sensation are intact in fingers and toes.  COORDINATION: There is no trunk or limb dysmetria noted.  GAIT/STANCE: Need to push off to get up from seated position, antalgic due to left knee pain,  REVIEW OF SYSTEMS:  Full 14 system review of systems performed and notable only for as above All other review of systems were negative.   ALLERGIES: Allergies  Allergen Reactions   Bee Venom Swelling and Anaphylaxis   Yellow Jacket Venom Swelling   Biaxin [Clarithromycin]  Diarrhea and Rash   Robaxin [Methocarbamol] Swelling    Feet, chest pain, headache   Abilify [Aripiprazole] Other (See Comments)    MOOD SWING   Buspar [Buspirone] Diarrhea   Hydrochlorothiazide Rash   Lisinopril Cough   Maxzide [Triamterene-Hctz] Rash   Penicillins Anxiety, Other (See Comments) and Hypertension    Nausea, vomiting, dizziness-possible allergy versus neurological event   Phentermine Other (See Comments)    MOOD CHANGE    Quetiapine Other (See Comments)    Felt funny MOOD SWINGS   Seroquel [Quetiapine Fumarate] Other (See Comments)    MOOD SWINGS    HOME MEDICATIONS: Current Outpatient Medications  Medication Sig Dispense Refill   albuterol (VENTOLIN HFA) 108 (90 Base) MCG/ACT inhaler Inhale 2 puffs into the lungs every 6 (six) hours as needed for wheezing or shortness of breath. 6.7 each 1   buPROPion (WELLBUTRIN XL) 300 MG 24 hr tablet Take 1 tablet (300 mg total) by mouth daily. 90 tablet 1   Cholecalciferol (VITAMIN D) 125 MCG (5000 UT) CAPS Take 5,000 Units by mouth daily.     DULoxetine (CYMBALTA) 60 MG capsule Take 1 capsule (60 mg total) by mouth daily. 90 capsule 1   ELIQUIS 5 MG TABS tablet TAKE 1 TABLET BY MOUTH TWICE A DAY (Patient taking differently: Take 5 mg by mouth 2 (two) times daily.) 60 tablet 11   levothyroxine (SYNTHROID) 150 MCG tablet Take 1 tablet (150 mcg total) by mouth daily. 90 tablet 3   magnesium oxide (MAG-OX) 400 MG tablet Take 1 tablet (400 mg total) by mouth daily. 90 tablet 3   metaxalone (SKELAXIN) 800 MG tablet Take 1 tablet (800 mg total) by mouth 3 (three) times daily as needed for muscle spasms. (Patient taking differently: Take 800 mg by mouth 2 (two) times daily as needed for muscle spasms.) 90 tablet 5   metoprolol succinate (TOPROL-XL) 25 MG 24 hr tablet Take 0.5 tablets (12.5 mg total) by mouth daily. 45 tablet 1   pregabalin (LYRICA) 150 MG capsule Take 1 capsule (150 mg total) by mouth 2 (two) times daily. 180 capsule 1    progesterone (PROMETRIUM) 100 MG capsule Take 100 mg by mouth at bedtime.     rOPINIRole (REQUIP XL) 4 MG 24 hr tablet Take 1 tablet (4 mg total) by mouth daily. 90 tablet 1   rOPINIRole (REQUIP) 2 MG tablet Take 1/2 tablet at lunch and 1/2 tablet at bed time. 90 tablet 1   telmisartan (MICARDIS) 80 MG tablet Take 1 tablet (80 mg total) by mouth daily. 90 tablet 1   tiZANidine (ZANAFLEX) 4 MG tablet Take 4 mg by mouth every 8 (eight) hours as needed.     No current facility-administered medications for this visit.    PAST MEDICAL HISTORY: Past Medical History:  Diagnosis Date   AC joint derangement 03/26/2021   Acute bronchitis due to COVID-19 virus 09/06/2020   Anxiety 01/12/2013   Arthralgia 07/08/2016   Asthma    Atrial fibrillation (HCC) 12/06/2018   B12 deficiency 01/31/2018   Chicken pox    Chronic insomnia 11/24/2016   Clostridium difficile infection 04/02/2014, 02/06/2014   Complex tear of medial meniscus of left knee as current injury 12/01/2018  Complex tear of medial meniscus of left knee as current injury 12/01/2018   Complication of anesthesia    DDD (degenerative disc disease), lumbar 09/09/2018   Depression    Diabetes mellitus without complication (HCC) 12/07/2018   Dyslipidemia    Dysrhythmia    Elevated hemoglobin A1c 07/08/2016   Estrogen deficiency 12/06/2018   Foraminal stenosis of lumbar region 09/09/2018   Hair loss 07/20/2017   History of lumbar fusion 09/09/2018   Hormone replacement therapy (HRT) 07/20/2017   HTN (hypertension)    Hypertension 07/08/2016   Hypokalemia 12/06/2018   Hypothyroidism    Left medial tibial plateau fracture 11/10/2018   Lumbar radiculopathy 09/09/2018   Referred to NS 09/09/2018   Migraine    Mild memory disturbance 11/13/2013   Morbid obesity (HCC) 07/08/2016   Multiple allergies    Obesity    PONV (postoperative nausea and vomiting)    Rash associated with COVID-19 10/02/2020   Restless legs syndrome (RLS)  01/12/2013   Follows with Dr. Anne Hahn who prescribes Requip and gabapentin   RLS (restless legs syndrome)    Scapular dysfunction 04/02/2021   Stress fracture of rib 04/15/2021   Vitamin D deficiency 12/06/2018   Wears glasses 07/08/2016    PAST SURGICAL HISTORY: Past Surgical History:  Procedure Laterality Date   BREAST BIOPSY  2956,2130   x 2 benign lesion   CERVICAL DISCECTOMY  2003   CERVICAL FUSION  2010   x2- C4-C6   Hand fracture surgery     LUMBAR FUSION  2016   TOTAL KNEE ARTHROPLASTY Left 03/31/2022   Procedure: LEFT TOTAL KNEE ARTHROPLASTY;  Surgeon: Marcene Corning, MD;  Location: [facility];  Service: Orthopedics;  Laterality: Left;   VAGINAL HYSTERECTOMY  2009    FAMILY HISTORY: Family History  Problem Relation Age of Onset   Hypertension Father    Diabetes Father    Heart disease Father    Hearing loss Father    Diabetes Brother    Liver cancer Mother    COPD Mother    Mental illness Mother    Diabetes Mother    Hearing loss Mother    Heart disease Mother    Arthritis/Rheumatoid Mother     SOCIAL HISTORY: Social History   Socioeconomic History   Marital status: Married    Spouse name: Gery Pray   Number of children: 2   Years of education: COLLEGE   Highest education level: Associate degree: occupational, Scientist, product/process development, or vocational program  Occupational History   Occupation: OWNER    Employer: [employer]  Tobacco Use   Smoking status: Never    Passive exposure: Never   Smokeless tobacco: Never  Vaping Use   Vaping status: Never Used  Substance and Sexual Activity   Alcohol use: No    Comment: occasionally   Drug use: No   Sexual activity: Yes    Partners: Male    Birth control/protection: None  Other Topics Concern   Not on file  Social History Narrative   Patient is married Gery Pray)  2 children Molly Maduro and [place])   Patient is right handed.   Patient has a college education. 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    Patient drinks 1 cups daily. Uses  herbal remedies, takes a daily vitamin.   Wears her seatbelt, smoke detector in the home.   Social Determinants of Health   Financial Resource Strain: Low Risk  (10/01/2022)   Overall Financial Resource Strain (CARDIA)    Difficulty of Paying Living Expenses: Not  hard at all  Food Insecurity: No Food Insecurity (10/01/2022)   Hunger Vital Sign    Worried About Running Out of Food in the Last Year: Never true    Ran Out of Food in the Last Year: Never true  Transportation Needs: No Transportation Needs (10/01/2022)   PRAPARE - Administrator, Civil Service (Medical): No    Lack of Transportation (Non-Medical): No  Physical Activity: Insufficiently Active (10/01/2022)   Exercise Vital Sign    Days of Exercise per Week: 2 days    Minutes of Exercise per Session: 40 min  Stress: Patient Declined (10/01/2022)   [employer] of Occupational Health - Occupational Stress Questionnaire    Feeling of Stress : Patient declined  Social Connections: Socially Integrated (10/01/2022)   Social Connection and Isolation Panel [NHANES]    Frequency of Communication with Friends and Family: More than three times a week    Frequency of Social Gatherings with Friends and Family: Once a week    Attends Religious Services: More than 4 times per year    Active Member of [employer] or Organizations: Yes    Attends Engineer, structural: More than 4 times per year    Marital Status: Married  Catering manager Violence: Not on file      Levert Feinstein, M.D. Ph.D.  [facility] [address], [address] [place], [place] [address] Ph: [phone] Fax: [phone]  CC:  Natalia Leatherwood, DO [address],  [place] [address]  Claiborne Billings, Renee A, DO

## 2022-10-13 ENCOUNTER — Ambulatory Visit (INDEPENDENT_AMBULATORY_CARE_PROVIDER_SITE_OTHER): Payer: 59 | Admitting: Family Medicine

## 2022-10-13 ENCOUNTER — Encounter: Payer: Self-pay | Admitting: Family Medicine

## 2022-10-13 VITALS — BP 135/84 | HR 77 | Temp 98.0°F | Wt 241.4 lb

## 2022-10-13 DIAGNOSIS — R202 Paresthesia of skin: Secondary | ICD-10-CM

## 2022-10-13 DIAGNOSIS — H5340 Unspecified visual field defects: Secondary | ICD-10-CM | POA: Diagnosis not present

## 2022-10-13 DIAGNOSIS — R200 Anesthesia of skin: Secondary | ICD-10-CM

## 2022-10-13 DIAGNOSIS — R42 Dizziness and giddiness: Secondary | ICD-10-CM

## 2022-10-13 DIAGNOSIS — R2689 Other abnormalities of gait and mobility: Secondary | ICD-10-CM | POA: Diagnosis not present

## 2022-10-13 MED ORDER — PREDNISONE 20 MG PO TABS
ORAL_TABLET | ORAL | 0 refills | Status: DC
Start: 2022-10-13 — End: 2022-11-17

## 2022-10-13 NOTE — Progress Notes (Unsigned)
[name] [name] , 1966-02-07, 57 y.o., female MRN: [id number] Patient Care Team    Relationship Specialty Notifications Start End  Natalia Leatherwood, DO PCP - General Family Medicine  07/07/16   Georgeanna Lea, MD PCP - Cardiology Cardiology  02/09/22   Ob/Gyn, Nestor Ramp    07/08/16   Charlott Rakes, MD Consulting Physician Gastroenterology  07/08/16   York Spaniel, MD (Inactive) Consulting Physician Neurology  07/08/16   Jones Bales, MD Attending Physician Endocrinology  07/08/16    Comment: Pt established with Ailene Ravel, NP at this location. - [facility]- thyroid  Letta Kocher, MD Consulting Physician Rehabilitation  03/06/19    Comment: Neurosurgeon-spine and scoliosis specialist    Chief Complaint  Patient presents with   Numbness    For the last few weeks left arm, fingers, toes (both feet the last few days)     Subjective: [name] [name] is a 57 y.o. Pt presents for an OV with complaints of lateral hand and feet numbness.  She reports the pain has become unbearable.  She has a history of cervical fusion C4-C7.  She has been under the care of of orthopedics for this condition and reports she has had a recent CT, after April 2023's MRI which showed stability.  She recently had TIA-like symptoms affecting her left visual field, feeling extremely fatigued with new daily headache.  MRI brain was reassuring.  Patient did follow-up with neurology, [facility] in which all of her symptoms were resolved at that time.  She reports the symptoms she is complaining of today quickly returned.  She reports her fingertips are so numb typing is difficult for her which is part of her job.   April 2023 cervical MRI IMPRESSION: 1. Multilevel degenerative change, greatest at C7-T1 where there is 2-3 mm of anterolisthesis, mild to moderate canal stenosis, and moderate bilateral foraminal stenosis. 2. C4-C7 solid bony fusion with C4-C5 ACDF.      10/02/2022   10:14 AM 08/07/2022    2:31 PM 07/15/2022   11:06 AM 07/01/2022   11:13 AM 06/03/2022   10:48 AM  Depression screen PHQ 2/9  Decreased Interest 1 1 0 0 0  Down, Depressed, Hopeless 0 1 0 1 0  PHQ - 2 Score 1 2 0 1 0  Altered sleeping 3 2 3 3 3   Tired, decreased energy 3 3 2 1  0  Change in appetite 3 2 0 1 0  Feeling bad or failure about yourself  1 0 0 0 0  Trouble concentrating 3 2 1  0 3  Moving slowly or fidgety/restless 3 1 0 0 0  Suicidal thoughts 0 0 0 0 0  PHQ-9 Score 17 12 6 6 6     Allergies  Allergen Reactions   Bee Venom Swelling and Anaphylaxis   Yellow Jacket Venom Swelling   Biaxin [Clarithromycin] Diarrhea and Rash   Robaxin [Methocarbamol] Swelling    Feet, chest pain, headache   Abilify [Aripiprazole] Other (See Comments)    MOOD SWING   Buspar [Buspirone] Diarrhea   Hydrochlorothiazide Rash   Lisinopril Cough   Maxzide [Triamterene-Hctz] Rash   Penicillins Anxiety, Other (See Comments) and Hypertension    Nausea, vomiting, dizziness-possible allergy versus neurological event   Phentermine Other (See Comments)    MOOD CHANGE    Quetiapine Other (See Comments)    Felt funny MOOD SWINGS   Seroquel [Quetiapine Fumarate] Other (See Comments)    MOOD  SWINGS   Social History   Social History Narrative   Patient is married Gery Pray)  2 children Molly Maduro and Harrah)   Patient is right handed.   Patient has a college education. 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    Patient drinks 1 cups daily. Uses herbal remedies, takes a daily vitamin.   Wears her seatbelt, smoke detector in the home.   Past Medical History:  Diagnosis Date   AC joint derangement 03/26/2021   Acute bronchitis due to COVID-19 virus 09/06/2020   Anxiety 01/12/2013   Arthralgia 07/08/2016   Asthma    Atrial fibrillation (HCC) 12/06/2018   B12 deficiency 01/31/2018   Chicken pox    Chronic insomnia 11/24/2016   Clostridium difficile infection 04/02/2014, 02/06/2014   Complex tear of medial  meniscus of left knee as current injury 12/01/2018   Complex tear of medial meniscus of left knee as current injury 12/01/2018   Complication of anesthesia    DDD (degenerative disc disease), lumbar 09/09/2018   Depression    Diabetes mellitus without complication (HCC) 12/07/2018   Dyslipidemia    Dysrhythmia    Elevated hemoglobin A1c 07/08/2016   Estrogen deficiency 12/06/2018   Foraminal stenosis of lumbar region 09/09/2018   Hair loss 07/20/2017   History of lumbar fusion 09/09/2018   Hormone replacement therapy (HRT) 07/20/2017   HTN (hypertension)    Hypertension 07/08/2016   Hypokalemia 12/06/2018   Hypothyroidism    Left medial tibial plateau fracture 11/10/2018   Lumbar radiculopathy 09/09/2018   Referred to NS 09/09/2018   Migraine    Mild memory disturbance 11/13/2013   Morbid obesity (HCC) 07/08/2016   Multiple allergies    Obesity    PONV (postoperative nausea and vomiting)    Rash associated with COVID-19 10/02/2020   Restless legs syndrome (RLS) 01/12/2013   Follows with Dr. Anne Hahn who prescribes Requip and gabapentin   RLS (restless legs syndrome)    Scapular dysfunction 04/02/2021   Stress fracture of rib 04/15/2021   Vitamin D deficiency 12/06/2018   Wears glasses 07/08/2016   Past Surgical History:  Procedure Laterality Date   BREAST BIOPSY  1610,9604   x 2 benign lesion   CERVICAL DISCECTOMY  2003   CERVICAL FUSION  2010   x2- C4-C6   Hand fracture surgery     LUMBAR FUSION  2016   TOTAL KNEE ARTHROPLASTY Left 03/31/2022   Procedure: LEFT TOTAL KNEE ARTHROPLASTY;  Surgeon: Marcene Corning, MD;  Location: [facility];  Service: Orthopedics;  Laterality: Left;   VAGINAL HYSTERECTOMY  2009   Family History  Problem Relation Age of Onset   Hypertension Father    Diabetes Father    Heart disease Father    Hearing loss Father    Diabetes Brother    Liver cancer Mother    COPD Mother    Mental illness Mother    Diabetes Mother    Hearing loss Mother     Heart disease Mother    Arthritis/Rheumatoid Mother    Allergies as of 10/13/2022       Reactions   Bee Venom Swelling, Anaphylaxis   Yellow Jacket Venom Swelling   Biaxin [clarithromycin] Diarrhea, Rash   Robaxin [methocarbamol] Swelling   Feet, chest pain, headache   Abilify [aripiprazole] Other (See Comments)   MOOD SWING   Buspar [buspirone] Diarrhea   Hydrochlorothiazide Rash   Lisinopril Cough   Maxzide [triamterene-hctz] Rash   Penicillins Anxiety, Other (See Comments), Hypertension   Nausea, vomiting, dizziness-possible allergy  versus neurological event   Phentermine Other (See Comments)   MOOD CHANGE   Quetiapine Other (See Comments)   Felt funny MOOD SWINGS   Seroquel [quetiapine Fumarate] Other (See Comments)   MOOD SWINGS        Medication List        Accurate as of October 13, 2022  3:28 PM. If you have any questions, ask your nurse or doctor.          albuterol 108 (90 Base) MCG/ACT inhaler Commonly known as: VENTOLIN HFA Inhale 2 puffs into the lungs every 6 (six) hours as needed for wheezing or shortness of breath.   buPROPion 300 MG 24 hr tablet Commonly known as: WELLBUTRIN XL Take 1 tablet (300 mg total) by mouth daily.   DULoxetine 60 MG capsule Commonly known as: CYMBALTA Take 1 capsule (60 mg total) by mouth daily.   Eliquis 5 MG Tabs tablet Generic drug: apixaban TAKE 1 TABLET BY MOUTH TWICE A DAY What changed: how much to take   levothyroxine 150 MCG tablet Commonly known as: SYNTHROID Take 1 tablet (150 mcg total) by mouth daily.   magnesium oxide 400 MG tablet Commonly known as: MAG-OX Take 1 tablet (400 mg total) by mouth daily.   metaxalone 800 MG tablet Commonly known as: SKELAXIN Take 1 tablet (800 mg total) by mouth 3 (three) times daily as needed for muscle spasms. What changed: when to take this   metoprolol succinate 25 MG 24 hr tablet Commonly known as: TOPROL-XL Take 0.5 tablets (12.5 mg total) by mouth  daily.   predniSONE 20 MG tablet Commonly known as: DELTASONE 60 mg x3d, 40 mg x3d, 20 mg x2d, 10 mg x2d Started by: Felix Pacini   pregabalin 150 MG capsule Commonly known as: Lyrica Take 1 capsule (150 mg total) by mouth 2 (two) times daily.   progesterone 100 MG capsule Commonly known as: PROMETRIUM Take 100 mg by mouth at bedtime.   rOPINIRole 2 MG tablet Commonly known as: REQUIP Take 1/2 tablet at lunch and 1/2 tablet at bed time.   rOPINIRole 4 MG 24 hr tablet Commonly known as: REQUIP XL Take 1 tablet (4 mg total) by mouth daily.   telmisartan 80 MG tablet Commonly known as: Micardis Take 1 tablet (80 mg total) by mouth daily.   tiZANidine 4 MG tablet Commonly known as: ZANAFLEX Take 4 mg by mouth every 8 (eight) hours as needed.   Vitamin D 125 MCG (5000 UT) Caps Take 5,000 Units by mouth daily.        All past medical history, surgical history, allergies, family history, immunizations andmedications were updated in the EMR today and reviewed under the history and medication portions of their EMR.     ROS Negative, with the exception of above mentioned in HPI   Objective:  BP 135/84   Pulse 77   Temp 98 F (36.7 C)   Wt 241 lb 6.4 oz (109.5 kg)   SpO2 96%   BMI 44.15 kg/m  Body mass index is 44.15 kg/m. Physical Exam Vitals and nursing note reviewed.  Constitutional:      General: She is not in acute distress.    Appearance: Normal appearance. She is not ill-appearing, toxic-appearing or diaphoretic.  HENT:     Head: Normocephalic and atraumatic.  Eyes:     General: No visual field deficit or scleral icterus.       Right eye: No discharge.        Left  eye: No discharge.     Extraocular Movements: Extraocular movements intact.     Conjunctiva/sclera: Conjunctivae normal.     Pupils: Pupils are equal, round, and reactive to light.  Pulmonary:     Effort: Pulmonary effort is normal.  Musculoskeletal:     Right lower leg: No edema.      Left lower leg: No edema.  Skin:    General: Skin is warm.     Findings: No rash.  Neurological:     Mental Status: She is alert and oriented to person, place, and time. Mental status is at baseline.     Cranial Nerves: Cranial nerve deficit present. No dysarthria or facial asymmetry.     Sensory: Sensory deficit present.     Motor: No weakness.     Coordination: Coordination is intact.     Gait: Gait normal.     Comments: + tinels left wrist and elbow.  MS 5/5 b/l UE with exception shoulder shrug on left mildly diminished in comparison.   Psychiatric:        Mood and Affect: Mood normal.        Behavior: Behavior normal.        Thought Content: Thought content normal.        Judgment: Judgment normal.      No results found. No results found. No results found for this or any previous visit (from the past 24 hour(s)).  Assessment/Plan: [name] [name] is a 57 y.o. female present for OV for  Bilateral hand numbness/Numbness and tingling of both feet - she will obtain copies of any cervical spine imaging at ortho after 05/2021. - prednisone taper provided to see if helps ease symptoms for her.  - reassuring her esr was normal 1 week ago.  - sister recently diagnosed w/ transverse myelitis and found to have carotid artery stenosis.  - we discussed possibility of cervical spine etiology, symptoms are at level of fusion vs carpal tunnel, ulnar nerve impingement, and other less common conditions such as heavy metal exposure, autoimmune disorders etc, pt elected to have work up initiated today - ordered nerve conduction studies of upper extremities> referral to ortho/hand if positive for CT or ulnar nerve etiology of discomfort.   - Heavy Metals Panel, Blood - B12 and Folate Panel - IBC + Ferritin - Homocysteine - PTH, Intact and Calcium - predniSONE (DELTASONE) 20 MG tablet; 60 mg x3d, 40 mg x3d, 20 mg x2d, 10 mg x2d  Dispense: 18 tablet; Refill: 0 - ANA, IFA Comprehensive  Panel-([facility]) - Nerve conduction test; Future - Protein,Total and Electrophor w/IFE - If all test unrevealing, consider psychosomatic etiology- her sister recently diagnosed with neurological condition, with symptoms starting similar. There is currently not a known trigger/cause to her sisters nerve condition.   Dizziness/ Balance problem/ Visual field defect She has an appt with ophthalmology 9/3 to work possible TIA affecting her left lateral vision.  MRI reassuring.  Vision returned to normal - US Carotid Duplex Bilateral; Future - continued compliance with eliquis   Reviewed expectations re: course of current medical issues. Discussed self-management of symptoms. Outlined signs and symptoms indicating need for more acute intervention. Patient verbalized understanding and all questions were answered. Patient received an After-Visit Summary.    Orders Placed This Encounter  Procedures   US Carotid Duplex Bilateral   Heavy Metals Panel, Blood   B12 and Folate Panel   IBC + Ferritin   Homocysteine   PTH, Intact and Calcium  ANA, IFA Comprehensive Panel-([facility])   Protein,Total and Electrophor w/IFE   Nerve conduction test   Meds ordered this encounter  Medications   predniSONE (DELTASONE) 20 MG tablet    Sig: 60 mg x3d, 40 mg x3d, 20 mg x2d, 10 mg x2d    Dispense:  18 tablet    Refill:  0   Referral Orders  No referral(s) requested today   52 minutes on day of service spent in patient encounter initiating neurological evaluation and patient counseling and education.   Note is dictated utilizing voice recognition software. Although note has been proof read prior to signing, occasional typographical errors still can be missed. If any questions arise, please do not hesitate to call for verification.   electronically signed by:  Felix Pacini, DO  [facility] - OR

## 2022-10-15 ENCOUNTER — Encounter (INDEPENDENT_AMBULATORY_CARE_PROVIDER_SITE_OTHER): Payer: Self-pay

## 2022-10-15 ENCOUNTER — Other Ambulatory Visit: Payer: Self-pay

## 2022-10-15 ENCOUNTER — Telehealth: Payer: Self-pay | Admitting: Family Medicine

## 2022-10-15 DIAGNOSIS — E538 Deficiency of other specified B group vitamins: Secondary | ICD-10-CM

## 2022-10-15 DIAGNOSIS — R7989 Other specified abnormal findings of blood chemistry: Secondary | ICD-10-CM | POA: Insufficient documentation

## 2022-10-15 MED ORDER — CYANOCOBALAMIN 1000 MCG/ML IJ SOLN
1000.0000 ug | INTRAMUSCULAR | Status: AC
  Administered 2022-11-04 – 2022-11-18 (×2): 1000 ug via INTRAMUSCULAR

## 2022-10-15 NOTE — Telephone Encounter (Signed)
Patient has low B12 and elevated homocystine levels. Please set her up for B12 injections every 2 weeks for 4 doses (see results tab note for f full details in the event patient has not seen it yet.).   There are still 2 labs pending for her.  All other labs that were resulted patient was sent my responses and the results tab.

## 2022-10-15 NOTE — Telephone Encounter (Signed)
Pt advised and scheduled for 1st injection.

## 2022-10-16 ENCOUNTER — Ambulatory Visit
Admission: RE | Admit: 2022-10-16 | Discharge: 2022-10-16 | Disposition: A | Discharge status: Home / Self Care | Payer: 59 | Source: Ambulatory Visit | Attending: Family Medicine | Admitting: Family Medicine

## 2022-10-16 DIAGNOSIS — R42 Dizziness and giddiness: Secondary | ICD-10-CM

## 2022-10-16 DIAGNOSIS — G459 Transient cerebral ischemic attack, unspecified: Secondary | ICD-10-CM | POA: Diagnosis not present

## 2022-10-16 DIAGNOSIS — H5340 Unspecified visual field defects: Secondary | ICD-10-CM

## 2022-10-16 DIAGNOSIS — R2689 Other abnormalities of gait and mobility: Secondary | ICD-10-CM

## 2022-10-19 ENCOUNTER — Telehealth: Payer: Self-pay | Admitting: Family Medicine

## 2022-10-19 NOTE — Telephone Encounter (Signed)
  Please inform patient her carotid artery ultrasounds are normal. No significant stenosis of internal carotid arteries.

## 2022-10-19 NOTE — Telephone Encounter (Signed)
Results given to pt

## 2022-10-20 LAB — PROTEIN,TOTAL AND ELECTROPHOR W/IFE
Beta 2: 0.3 g/dL (ref 0.2–0.5)
Gamma Globulin: 1 g/dL (ref 0.8–1.7)

## 2022-10-21 ENCOUNTER — Ambulatory Visit (INDEPENDENT_AMBULATORY_CARE_PROVIDER_SITE_OTHER): Payer: 59

## 2022-10-21 DIAGNOSIS — E538 Deficiency of other specified B group vitamins: Secondary | ICD-10-CM

## 2022-10-21 MED ORDER — CYANOCOBALAMIN 1000 MCG/ML IJ SOLN
1000.0000 ug | Freq: Once | INTRAMUSCULAR | Status: AC
Start: 2022-10-21 — End: 2022-11-03
  Administered 2022-11-03: 1000 ug via INTRAMUSCULAR

## 2022-10-21 NOTE — Progress Notes (Signed)
Pt here for monthly B12 injection per Dr. Claiborne Billings   B12 given IM, and pt tolerated injection well.   Next B12 injection scheduled for in  2 weeks.

## 2022-11-03 DIAGNOSIS — E538 Deficiency of other specified B group vitamins: Secondary | ICD-10-CM | POA: Diagnosis not present

## 2022-11-04 ENCOUNTER — Ambulatory Visit (INDEPENDENT_AMBULATORY_CARE_PROVIDER_SITE_OTHER): Payer: 59

## 2022-11-04 DIAGNOSIS — E538 Deficiency of other specified B group vitamins: Secondary | ICD-10-CM | POA: Diagnosis not present

## 2022-11-04 NOTE — Progress Notes (Signed)
Pt here for biweekly B12 injection per Dr. Claiborne Billings   B12 given IM, and pt tolerated injection well.   Next B12 injection scheduled for in  2 weeks.   Please sign in absence of PCP

## 2022-11-06 ENCOUNTER — Telehealth: Payer: Self-pay

## 2022-11-06 NOTE — Telephone Encounter (Signed)
Message sent to coordinator for status update

## 2022-11-06 NOTE — Telephone Encounter (Signed)
Patient calling to check status of nerve conduction test.  She states Dr. Claiborne Billings ordered scan middle of August (10/13/22).  Please advise.

## 2022-11-10 ENCOUNTER — Encounter: Payer: Self-pay | Admitting: Family Medicine

## 2022-11-10 NOTE — Telephone Encounter (Signed)
Please call to see why nerve conduction test is not scheduled yet. This should have been completed by now.  Please let me know when this is scheduled, then I can better advise her if we could get her to a neurosurgeon instead of one of her current neurologist.

## 2022-11-12 ENCOUNTER — Telehealth: Payer: Self-pay | Admitting: Neurology

## 2022-11-12 NOTE — Telephone Encounter (Signed)
Called pt and scheduled ncv/emg with Dr. Terrace Arabia for 11/13 @ 7:30 am.

## 2022-11-12 NOTE — Telephone Encounter (Signed)
Erie Noe CMA at [facility], Luster Landsberg A, DO office called wanting to know when the pt's Nerve conduction be scheduled. She states her office sent the order last month. Please advise.

## 2022-11-12 NOTE — Telephone Encounter (Signed)
[facility] is to call office to give more information on scheduling

## 2022-11-12 NOTE — Telephone Encounter (Signed)
Ok  to schedule emg

## 2022-11-13 NOTE — Telephone Encounter (Signed)
Noted  

## 2022-11-13 NOTE — Addendum Note (Signed)
Addended by: Filomena Jungling on: 11/13/2022 04:22 PM   Modules accepted: Orders

## 2022-11-13 NOTE — Telephone Encounter (Signed)
Order updated

## 2022-11-13 NOTE — Telephone Encounter (Signed)
Scheduled 11/13 @ 8:30 scheduler states there is nothing sooner at this time

## 2022-11-13 NOTE — Telephone Encounter (Signed)
noted 

## 2022-11-13 NOTE — Addendum Note (Signed)
Addended by: Filomena Jungling on: 11/13/2022 11:04 AM   Modules accepted: Orders

## 2022-11-16 NOTE — Telephone Encounter (Signed)
Any update?

## 2022-11-17 ENCOUNTER — Encounter: Payer: Self-pay | Admitting: Neurology

## 2022-11-17 ENCOUNTER — Ambulatory Visit (INDEPENDENT_AMBULATORY_CARE_PROVIDER_SITE_OTHER): Payer: 59 | Admitting: Neurology

## 2022-11-17 VITALS — BP 150/82 | HR 82 | Ht 63.0 in | Wt 240.0 lb

## 2022-11-17 DIAGNOSIS — I480 Paroxysmal atrial fibrillation: Secondary | ICD-10-CM

## 2022-11-17 DIAGNOSIS — G4719 Other hypersomnia: Secondary | ICD-10-CM

## 2022-11-17 DIAGNOSIS — Z9189 Other specified personal risk factors, not elsewhere classified: Secondary | ICD-10-CM | POA: Diagnosis not present

## 2022-11-17 DIAGNOSIS — R0683 Snoring: Secondary | ICD-10-CM | POA: Diagnosis not present

## 2022-11-17 DIAGNOSIS — G2581 Restless legs syndrome: Secondary | ICD-10-CM | POA: Diagnosis not present

## 2022-11-17 DIAGNOSIS — R0681 Apnea, not elsewhere classified: Secondary | ICD-10-CM | POA: Diagnosis not present

## 2022-11-17 DIAGNOSIS — R519 Headache, unspecified: Secondary | ICD-10-CM | POA: Diagnosis not present

## 2022-11-17 NOTE — Patient Instructions (Signed)
Thank you for choosing [facility] for your sleep related care! It was nice to meet you today!   Here is what we discussed today:    Based on your symptoms and your exam I believe you are at risk for obstructive sleep apnea (aka OSA). We should proceed with a sleep study to determine whether you do or do not have OSA and how severe it is. Even, if you have mild OSA, I may want you to consider treatment with CPAP, as treatment of even borderline or mild sleep apnea can result and improvement of symptoms such as sleep disruption, daytime sleepiness, nighttime bathroom breaks, restless leg symptoms, improvement of headache syndromes, even improved mood disorder.   As explained, an attended sleep study (meaning you get to stay overnight in the sleep lab), lets [place] monitor sleep-related behaviors such as sleep talking and leg movements in sleep, in addition to monitoring for sleep apnea.  A home sleep test is a screening tool for sleep apnea diagnosis only, but unfortunately, does not help with any other sleep-related diagnoses.  Please remember, the long-term risks and ramifications of untreated moderate to severe obstructive sleep apnea may include (but are not limited to): increased risk for cardiovascular disease, including congestive heart failure, stroke, difficult to control hypertension, treatment resistant obesity, arrhythmias, especially irregular heartbeat commonly known as A. Fib. (atrial fibrillation); even type 2 diabetes has been linked to untreated OSA.   Other correlations that untreated obstructive sleep apnea include macular edema which is swelling of the retina in the eyes, droopy eyelid syndrome, and elevated hemoglobin and hematocrit levels (often referred to as polycythemia).  Sleep apnea can cause disruption of sleep and sleep deprivation in most cases, which, in turn, can cause recurrent headaches, problems with memory, mood, concentration, focus, and vigilance. Most  people with untreated sleep apnea report excessive daytime sleepiness, which can affect their ability to drive. Please do not drive or use heavy equipment or machinery, if you feel sleepy! Patients with sleep apnea can also develop difficulty initiating and maintaining sleep (aka insomnia).   Having sleep apnea may increase your risk for other sleep disorders, including involuntary behaviors sleep such as sleep terrors, sleep talking, sleepwalking.    Having sleep apnea can also increase your risk for restless leg syndrome and leg movements at night.   Please note that untreated obstructive sleep apnea may carry additional perioperative morbidity. Patients with significant obstructive sleep apnea (typically, in the moderate to severe degree) should receive, if possible, perioperative PAP (positive airway pressure) therapy and the surgeons and particularly the anesthesiologists should be informed of the diagnosis and the severity of the sleep disordered breathing.   We will call you or email you through MyChart with regards to your test results and plan a follow-up in sleep clinic accordingly. Most likely, you will hear from one of our nurses.   Our sleep lab administrative assistant will call you to schedule your sleep study and give you further instructions, regarding the check in process for the sleep study, arrival time, what to bring, when you can expect to leave after the study, etc., and to answer any other logistical questions you may have. If you don't hear back from her by about 2 weeks from now, please feel free to call her direct line at [phone] or you can call our general clinic number, or email [place] through My Chart.

## 2022-11-17 NOTE — Progress Notes (Signed)
Subjective:    Patient ID: [name] [name], female    DOB: 21-Sep-1965, 57 y.o.   MRN: [id number]  HPI    Huston Foley, MD, PhD [name] [address], [address] [address] [place], [place] [address]  Dear Vivia Ewing,  I saw your patient, [name] [name], upon your kind request in my sleep clinic today for initial consultation of her sleep disorder, in particular, concern for underlying obstructive sleep apnea.  The patient is unaccompanied today.  As you know, [name] [name] is a 57 year old female with an underlying complex medical history of diabetes, hypertension, hyperlipidemia, restless leg syndrome, arthralgia, hypothyroidism, migraine headaches, vitamin D deficiency, B12 deficiency, history of A-fib, asthma, anxiety, depression, degenerative lumbar disc disease with status post surgery, cervical disc disease with status post surgery, arthritis with status post left knee replacement, and severe obesity with a BMI of over 40, who reports snoring and excessive daytime somnolence.  Her Epworth sleepiness score is 19 out of 24, fatigue severity score is 53 out of 63.  Of note, she is on multiple medications including potentially sedating medications.  She is currently on Cymbalta, fairly high dose of Lyrica and high-dose ropinirole.  She no longer takes Zanaflex and Robaxin.  I reviewed your office note from 10/08/2022.   She has a family history of sleep apnea in her half brother.  She lives with her husband, she has 2 grown children.  They have a small dog in the household.  They have a TV in the bedroom but it is typically not on at night.  She does not feel rested, she does not sleep well at night.  She does not take any sleep aid.  Bedtime is generally around 10:30 PM and rise time around 6 AM.  She works full-time from home, she has her own business.  She does not drink caffeine daily, she does not currently drink any alcohol, she is a non-smoker.  Weight has been fluctuating in the  past 6 months by about 20 pounds up or down.  Over the course of several years she has slowly gained weight.  She had a sleep study some 15 or 20 years ago, as per her recollection she was not diagnosed with sleep apnea and did not need CPAP therapy.  She was supposed to have another sleep study some 2 years ago through cardiology but decided not to pursue it.  She is reluctant to consider CPAP therapy.  Per husband's report she has witnessed apneas and disturbing snoring.  Her Past Medical History Is Significant For: Past Medical History:  Diagnosis Date   AC joint derangement 03/26/2021   Acute bronchitis due to COVID-19 virus 09/06/2020   Anxiety 01/12/2013   Arthralgia 07/08/2016   Asthma    Atrial fibrillation (HCC) 12/06/2018   B12 deficiency 01/31/2018   Chicken pox    Chronic insomnia 11/24/2016   Clostridium difficile infection 04/02/2014, 02/06/2014   Complex tear of medial meniscus of left knee as current injury 12/01/2018   Complex tear of medial meniscus of left knee as current injury 12/01/2018   Complication of anesthesia    DDD (degenerative disc disease), lumbar 09/09/2018   Depression    Diabetes mellitus without complication (HCC) 12/07/2018   Dyslipidemia    Dysrhythmia    Elevated hemoglobin A1c 07/08/2016   Estrogen deficiency 12/06/2018   Foraminal stenosis of lumbar region 09/09/2018   Hair loss 07/20/2017   History of lumbar fusion 09/09/2018   Hormone replacement therapy (  HRT) 07/20/2017   HTN (hypertension)    Hypertension 07/08/2016   Hypokalemia 12/06/2018   Hypothyroidism    Left medial tibial plateau fracture 11/10/2018   Lumbar radiculopathy 09/09/2018   Referred to [facility] 09/09/2018   Migraine    Mild memory disturbance 11/13/2013   Morbid obesity (HCC) 07/08/2016   Multiple allergies    Obesity    PONV (postoperative nausea and vomiting)    Rash associated with COVID-19 10/02/2020   Restless legs syndrome (RLS) 01/12/2013   Follows with Dr.  Anne Hahn who prescribes Requip and gabapentin   RLS (restless legs syndrome)    Scapular dysfunction 04/02/2021   Stress fracture of rib 04/15/2021   Vitamin D deficiency 12/06/2018   Wears glasses 07/08/2016    Her Past Surgical History Is Significant For: Past Surgical History:  Procedure Laterality Date   BREAST BIOPSY  5409,8119   x 2 benign lesion   CERVICAL DISCECTOMY  2003   CERVICAL FUSION  2010   x2- C4-C6   Hand fracture surgery     LUMBAR FUSION  2016   TOTAL KNEE ARTHROPLASTY Left 03/31/2022   Procedure: LEFT TOTAL KNEE ARTHROPLASTY;  Surgeon: Marcene Corning, MD;  Location: [facility];  Service: Orthopedics;  Laterality: Left;   VAGINAL HYSTERECTOMY  2009    Her Family History Is Significant For: Family History  Problem Relation Age of Onset   Hypertension Father    Diabetes Father    Heart disease Father    Hearing loss Father    Diabetes Brother    Liver cancer Mother    COPD Mother    Mental illness Mother    Diabetes Mother    Hearing loss Mother    Heart disease Mother    Arthritis/Rheumatoid Mother     Her Social History Is Significant For: Social History   Socioeconomic History   Marital status: Married    Spouse name: Gery Pray   Number of children: 2   Years of education: COLLEGE   Highest education level: Associate degree: occupational, Scientist, product/process development, or vocational program  Occupational History   Occupation: OWNER    Employer: [employer]  Tobacco Use   Smoking status: Never    Passive exposure: Never   Smokeless tobacco: Never  Vaping Use   Vaping status: Never Used  Substance and Sexual Activity   Alcohol use: No    Comment: occasionally   Drug use: No   Sexual activity: Yes    Partners: Male    Birth control/protection: None  Other Topics Concern   Not on file  Social History Narrative   Patient is married Gery Pray)  2 children Molly Maduro and [place])   Patient is right handed.   Patient has a college education. 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     Patient drinks 1 cups daily. Uses herbal remedies, takes a daily vitamin.   Wears her seatbelt, smoke detector in the home.   Social Determinants of Health   Financial Resource Strain: Low Risk  (10/01/2022)   Overall Financial Resource Strain (CARDIA)    Difficulty of Paying Living Expenses: Not hard at all  Food Insecurity: No Food Insecurity (10/01/2022)   Hunger Vital Sign    Worried About Running Out of Food in the Last Year: Never true    Ran Out of Food in the Last Year: Never true  Transportation Needs: No Transportation Needs (10/01/2022)   PRAPARE - Administrator, Civil Service (Medical): No    Lack of Transportation (Non-Medical):  No  Physical Activity: Insufficiently Active (10/01/2022)   Exercise Vital Sign    Days of Exercise per Week: 2 days    Minutes of Exercise per Session: 40 min  Stress: Patient Declined (10/01/2022)   [employer] of Occupational Health - Occupational Stress Questionnaire    Feeling of Stress : Patient declined  Social Connections: Socially Integrated (10/01/2022)   Social Connection and Isolation Panel [NHANES]    Frequency of Communication with Friends and Family: More than three times a week    Frequency of Social Gatherings with Friends and Family: Once a week    Attends Religious Services: More than 4 times per year    Active Member of [employer] or Organizations: Yes    Attends Engineer, structural: More than 4 times per year    Marital Status: Married    Her Allergies Are:  Allergies  Allergen Reactions   Bee Venom Swelling and Anaphylaxis   Yellow Jacket Venom Swelling   Biaxin [Clarithromycin] Diarrhea and Rash   Robaxin [Methocarbamol] Swelling    Feet, chest pain, headache   Abilify [Aripiprazole] Other (See Comments)    MOOD SWING   Buspar [Buspirone] Diarrhea   Hydrochlorothiazide Rash   Lisinopril Cough   Maxzide [Triamterene-Hctz] Rash   Penicillins Anxiety, Other (See Comments) and Hypertension     Nausea, vomiting, dizziness-possible allergy versus neurological event   Phentermine Other (See Comments)    MOOD CHANGE    Quetiapine Other (See Comments)    Felt funny MOOD SWINGS   Seroquel [Quetiapine Fumarate] Other (See Comments)    MOOD SWINGS  :   Her Current Medications Are:  Outpatient Encounter Medications as of 11/17/2022  Medication Sig   albuterol (VENTOLIN HFA) 108 (90 Base) MCG/ACT inhaler Inhale 2 puffs into the lungs every 6 (six) hours as needed for wheezing or shortness of breath.   buPROPion (WELLBUTRIN XL) 300 MG 24 hr tablet Take 1 tablet (300 mg total) by mouth daily.   Cholecalciferol (VITAMIN D) 125 MCG (5000 UT) CAPS Take 5,000 Units by mouth daily.   Cyanocobalamin (B-12 PO) Take 1 drop by mouth daily.   DULoxetine (CYMBALTA) 60 MG capsule Take 1 capsule (60 mg total) by mouth daily.   ELIQUIS 5 MG TABS tablet TAKE 1 TABLET BY MOUTH TWICE A DAY (Patient taking differently: Take 5 mg by mouth 2 (two) times daily.)   levothyroxine (SYNTHROID) 150 MCG tablet Take 1 tablet (150 mcg total) by mouth daily.   magnesium oxide (MAG-OX) 400 MG tablet Take 1 tablet (400 mg total) by mouth daily.   metoprolol succinate (TOPROL-XL) 25 MG 24 hr tablet Take 0.5 tablets (12.5 mg total) by mouth daily.   pregabalin (LYRICA) 150 MG capsule Take 1 capsule (150 mg total) by mouth 2 (two) times daily.   progesterone (PROMETRIUM) 100 MG capsule Take 100 mg by mouth at bedtime.   rOPINIRole (REQUIP XL) 4 MG 24 hr tablet Take 1 tablet (4 mg total) by mouth daily.   rOPINIRole (REQUIP) 2 MG tablet Take 1/2 tablet at lunch and 1/2 tablet at bed time.   telmisartan (MICARDIS) 80 MG tablet Take 1 tablet (80 mg total) by mouth daily.   [DISCONTINUED] metaxalone (SKELAXIN) 800 MG tablet Take 1 tablet (800 mg total) by mouth 3 (three) times daily as needed for muscle spasms. (Patient taking differently: Take 800 mg by mouth 2 (two) times daily as needed for muscle spasms.)   [DISCONTINUED]  predniSONE (DELTASONE) 20 MG tablet 60 mg  x3d, 40 mg x3d, 20 mg x2d, 10 mg x2d   [DISCONTINUED] tiZANidine (ZANAFLEX) 4 MG tablet Take 4 mg by mouth every 8 (eight) hours as needed.   Facility-Administered Encounter Medications as of 11/17/2022  Medication   cyanocobalamin (VITAMIN B12) injection 1,000 mcg  :   Review of Systems:  Out of a complete 14 point review of systems, all are reviewed and negative with the exception of these symptoms as listed below:   Review of Systems  Neurological:        Rm 5. Alone. NP / Internal - YAN / Loud snoring, poor sleep quality, RLS, excessive daytime sleepiness, obesity, narrow oropharyngeal space. ESS 19 / FSS 53       Objective:   Physical Exam  Physical Examination:   Vitals:   11/17/22 1410  BP: (!) 150/82  Pulse: 82    General Examination: The patient is a very pleasant 57 y.o. female in no acute distress. She appears well-developed and well-nourished and well groomed.   HEENT: Normocephalic, atraumatic, pupils are equal, round and reactive to light, extraocular tracking is good without limitation to gaze excursion or nystagmus noted. Hearing is grossly intact. Face is symmetric with normal facial animation. Speech is clear with no dysarthria noted. There is no hypophonia. There is no lip, neck/head, jaw or voice tremor. Neck is supple with full range of passive and active motion. There are no carotid bruits on auscultation. Oropharynx exam reveals: mild mouth dryness, adequate dental hygiene and moderate airway crowding, due to small airway entry, redundant soft palate, Mallampati class III, tonsillar size about 1+, right side easier to see compared to left.  Tongue protrudes centrally and palate elevates symmetrically, mild overbite noted.  Chest: Clear to auscultation without wheezing, rhonchi or crackles noted.  Heart: S1+S2+0, regular and normal without murmurs, rubs or gallops noted.   Abdomen: Soft, non-tender and  non-distended.  Extremities: There is no pitting edema in the distal lower extremities bilaterally.   Skin: Warm and dry without trophic changes noted.   Musculoskeletal: exam reveals no obvious joint deformities.   Neurologically:  Mental status: The patient is awake, alert and oriented in all 4 spheres. Her immediate and remote memory, attention, language skills and fund of knowledge are appropriate. There is no evidence of aphasia, agnosia, apraxia or anomia. Speech is clear with normal prosody and enunciation. Thought process is linear. Mood is normal and affect is normal.  Cranial nerves II - XII are as described above under HEENT exam.  Motor exam: Normal bulk, strength and tone is noted. There is no obvious action or resting tremor.  Fine motor skills and coordination: grossly intact.  Cerebellar testing: No dysmetria or intention tremor. There is no truncal or gait ataxia.  Sensory exam: intact to light touch in the upper and lower extremities.  Gait, station and balance: She stands easily. No veering to one side is noted. No leaning to one side is noted. Posture is age-appropriate and stance is narrow based. Gait shows normal stride length and normal pace. No problems turning are noted.     Assessment & Plan:  In summary, [name] [name] is a very pleasant 57 y.o.-year old female with an underlying complex medical history of diabetes, hypertension, hyperlipidemia, restless leg syndrome, arthralgia, hypothyroidism, migraine headaches, vitamin D deficiency, B12 deficiency, history of A-fib, asthma, anxiety, depression, degenerative lumbar disc disease with status post surgery, cervical disc disease with status post surgery, arthritis with status post left knee replacement, and severe  obesity with a BMI of over 40, whose history and physical exam are concerning for sleep disordered breathing, particularly obstructive sleep apnea (OSA). A laboratory attended sleep study is typically considered  "gold standard" for evaluation of sleep disordered breathing.   I had a long chat with the patient about my findings and the diagnosis of sleep apnea, particularly OSA, its prognosis and treatment options. We talked about medical/conservative treatments, surgical interventions and non-pharmacological approaches for symptom control. I explained, in particular, the risks and ramifications of untreated moderate to severe OSA, especially with respect to developing cardiovascular disease down the road, including congestive heart failure (CHF), difficult to treat hypertension, cardiac arrhythmias (particularly A-fib), neurovascular complications including TIA, stroke and dementia. Even type 2 diabetes has, in part, been linked to untreated OSA. Symptoms of untreated OSA may include (but may not be limited to) daytime sleepiness, nocturia (i.e. frequent nighttime urination), memory problems, mood irritability and suboptimally controlled or worsening mood disorder such as depression and/or anxiety, lack of energy, lack of motivation, physical discomfort, as well as recurrent headaches, especially morning or nocturnal headaches. We talked about the importance of maintaining a healthy lifestyle and striving for healthy weight.  I recommended a sleep study at this time. I outlined the differences between a laboratory attended sleep study which is considered more comprehensive and accurate over the option of a home sleep test (HST); the latter may lead to underestimation of sleep disordered breathing in some instances and does not help with diagnosing upper airway resistance syndrome and is not accurate enough to diagnose primary central sleep apnea typically. I outlined possible surgical and non-surgical treatment options of OSA, including the use of a positive airway pressure (PAP) device (i.e. CPAP, AutoPAP/APAP or BiPAP in certain circumstances), a custom-made dental device (aka oral appliance, which would require a  referral to a specialist dentist or orthodontist typically, and is generally speaking not considered for patients with full dentures or edentulous state), upper airway surgical options, such as traditional UPPP (which is not considered a first-line treatment) or the Inspire device (hypoglossal nerve stimulator, which would involve a referral for consultation with an ENT surgeon, after careful selection, following inclusion criteria - also not first-line treatment). I explained the PAP treatment option to the patient in detail, as this is generally considered first-line treatment.  The patient indicated that she would be reluctant to consider PAP therapy.   She is at risk of falling asleep at the wheel.  She is reminded not to drive when feeling sleepy.  She is advised that her daytime somnolence may in part be from taking several potentially sedating medications.  She is on a rather high dose of ropinirole for her restless leg syndrome but reports that it helps keep her symptoms at bay. We will pick up our discussion about the next steps and treatment options after testing.  We will keep her posted as to the test results by phone call and/or MyChart messaging where possible.  We will plan to follow-up in sleep clinic accordingly as well.  I answered all her questions today and the patient  was in agreement.   I encouraged her to call with any interim questions, concerns, problems or updates or email [place] through MyChart.  Generally speaking, sleep test authorizations may take up to 2 weeks, sometimes less, sometimes longer, the patient is encouraged to get in touch with [place] if they do not hear back from the sleep lab staff directly within the next 2 weeks.  Thank  you very much for allowing me to participate in the care of this nice patient. If I can be of any further assistance to you please do not hesitate to talk to me.  Sincerely,   Huston Foley, MD, PhD

## 2022-11-18 ENCOUNTER — Encounter: Payer: Self-pay | Admitting: Family Medicine

## 2022-11-18 ENCOUNTER — Ambulatory Visit (INDEPENDENT_AMBULATORY_CARE_PROVIDER_SITE_OTHER): Payer: 59 | Admitting: Family Medicine

## 2022-11-18 VITALS — BP 144/90 | HR 84 | Temp 98.0°F | Wt 243.4 lb

## 2022-11-18 DIAGNOSIS — E538 Deficiency of other specified B group vitamins: Secondary | ICD-10-CM

## 2022-11-18 DIAGNOSIS — M5412 Radiculopathy, cervical region: Secondary | ICD-10-CM | POA: Diagnosis not present

## 2022-11-18 DIAGNOSIS — M4322 Fusion of spine, cervical region: Secondary | ICD-10-CM

## 2022-11-18 DIAGNOSIS — M4802 Spinal stenosis, cervical region: Secondary | ICD-10-CM

## 2022-11-18 DIAGNOSIS — F339 Major depressive disorder, recurrent, unspecified: Secondary | ICD-10-CM | POA: Diagnosis not present

## 2022-11-18 DIAGNOSIS — E785 Hyperlipidemia, unspecified: Secondary | ICD-10-CM

## 2022-11-18 LAB — LIPID PANEL
Cholesterol: 146 mg/dL (ref 0–200)
HDL: 53.7 mg/dL (ref >39.00)
LDL Cholesterol: 70 mg/dL (ref 0–99)
NonHDL: 92.46
Total CHOL/HDL Ratio: 3
Triglycerides: 111 mg/dL (ref 0.0–149.0)
VLDL: 22.2 mg/dL (ref 0.0–40.0)

## 2022-11-18 MED ORDER — DULOXETINE HCL 60 MG PO CPEP
60.0000 mg | ORAL_CAPSULE | Freq: Every day | ORAL | 1 refills | Status: DC
Start: 2022-11-18 — End: 2023-05-04

## 2022-11-18 MED ORDER — BUPROPION HCL ER (XL) 300 MG PO TB24: 300.0000 mg | ORAL_TABLET | Freq: Every day | ORAL | 1 refills | Status: DC

## 2022-11-18 MED ORDER — PREGABALIN 150 MG PO CAPS: 150.0000 mg | ORAL_CAPSULE | Freq: Two times a day (BID) | ORAL | 1 refills | Status: DC

## 2022-11-18 MED ORDER — METOPROLOL SUCCINATE ER 25 MG PO TB24: 12.5000 mg | ORAL_TABLET | Freq: Every day | ORAL | 1 refills | Status: DC

## 2022-11-18 MED ORDER — TELMISARTAN 80 MG PO TABS: 80.0000 mg | ORAL_TABLET | Freq: Every day | ORAL | 1 refills | Status: DC

## 2022-11-18 NOTE — Progress Notes (Unsigned)
Patient ID: [name] [name], female  DOB: 1965-07-14, 57 y.o.   MRN: [id number] Patient Care Team    Relationship Specialty Notifications Start End  Natalia Leatherwood, DO PCP - General Family Medicine  07/07/16   Georgeanna Lea, MD PCP - Cardiology Cardiology  02/09/22   Ob/Gyn, Nestor Ramp    07/08/16   Charlott Rakes, MD Consulting Physician Gastroenterology  07/08/16   York Spaniel, MD (Inactive) Consulting Physician Neurology  07/08/16   Jones Bales, MD Attending Physician Endocrinology  07/08/16    Comment: Pt established with Ailene Ravel, NP at this location. - Integrative med- thyroid  Letta Kocher, MD Consulting Physician Rehabilitation  03/06/19    Comment: Neurosurgeon-spine and scoliosis specialist    Chief Complaint  Patient presents with   Medical Management of Chronic Issues    Pt is fasting no meds taken    Subjective: [name] [name] is a 57 y.o.  Female  present for [facility] All past medical history, surgical history, allergies, family history, immunizations, medications and social history were updated in the electronic medical record today. All recent labs, ED visits and hospitalizations within the last year were reviewed. *** Elevated hemoglobin A1c Patient reports she has been able to lose weight by portion control, higher protein diet with exercise.    Hypothyroidism due to acquired atrophy of thyroid Pt reports compliance with levo 150 mcg qd on an empty stomach.    Recurrent major depressive disorder, remission status unspecified (HCC) Pt reports compliance with wellbutrin and cymbalta. She feels her condition is well-controlled on these meds.    HTN/Paroxysmal atrial fibrillation (HCC)/Acquired thrombophilia (HCC)-eliquis/Morbid obesity (HCC)/dyslipidemia Pt reports compliance with telmisartan 80 mg qd and Patient denies chest pain, shortness of breath, dizziness or lower extremity edema.  metoprolol 12.5 mg qd   Pt is prescribed eliquis.    Arthralgia, unspecified joint/DDD (degenerative disc disease), lumbar Pt reports Cymbalta is helping a great deal and keeping her active.  B12 deficiency/Vitamin D deficiency She is supplemenenting     11/18/2022   10:56 AM 10/02/2022   10:14 AM 08/07/2022    2:31 PM 07/15/2022   11:06 AM 07/01/2022   11:13 AM  Depression screen PHQ 2/9  Decreased Interest 1 1 1  0 0  Down, Depressed, Hopeless 0 0 1 0 1  PHQ - 2 Score 1 1 2  0 1  Altered sleeping 3 3 2 3 3   Tired, decreased energy 3 3 3 2 1   Change in appetite 3 3 2  0 1  Feeling bad or failure about yourself  0 1 0 0 0  Trouble concentrating 2 3 2 1  0  Moving slowly or fidgety/restless 2 3 1  0 0  Suicidal thoughts 0 0 0 0 0  PHQ-9 Score 14 17 12 6 6   Difficult doing work/chores Somewhat difficult          11/18/2022   10:57 AM 10/02/2022   10:14 AM 07/01/2022   11:15 AM 07/15/2021    1:54 PM  GAD 7 : Generalized Anxiety Score  Nervous, Anxious, on Edge 1 3 1 2   Control/stop worrying 0 1 0 1  Worry too much - different things 0 1 0 1  Trouble relaxing 2 3 1 3   Restless 2 2 1 2   Easily annoyed or irritable 2 2 1 1   Afraid - awful might happen 0 2 0 0  Total GAD 7 Score 7 14 4 10   Anxiety Difficulty Not difficult at  all  Somewhat difficult      Immunization History  Administered Date(s) Administered   Influenza,inj,Quad PF,6+ Mos 01/25/2018, 12/05/2019, 12/12/2021   Influenza,trivalent, recombinat, inj, PF 12/04/2016   Influenza-Unspecified 01/25/2018, 12/05/2019   Tdap 07/15/2021   Zoster Recombinant(Shingrix) 07/15/2021, 12/12/2021    Past Medical History:  Diagnosis Date   AC joint derangement 03/26/2021   Acute bronchitis due to COVID-19 virus 09/06/2020   Anxiety 01/12/2013   Arthralgia 07/08/2016   Asthma    Atrial fibrillation (HCC) 12/06/2018   B12 deficiency 01/31/2018   Chicken pox    Chronic insomnia 11/24/2016   Clostridium difficile infection 04/02/2014, 02/06/2014   Complex tear of medial meniscus of  left knee as current injury 12/01/2018   Complex tear of medial meniscus of left knee as current injury 12/01/2018   Complication of anesthesia    DDD (degenerative disc disease), lumbar 09/09/2018   Depression    Diabetes mellitus without complication (HCC) 12/07/2018   Dyslipidemia    Dysrhythmia    Elevated hemoglobin A1c 07/08/2016   Estrogen deficiency 12/06/2018   Foraminal stenosis of lumbar region 09/09/2018   Hair loss 07/20/2017   History of lumbar fusion 09/09/2018   Hormone replacement therapy (HRT) 07/20/2017   HTN (hypertension)    Hypertension 07/08/2016   Hypokalemia 12/06/2018   Hypothyroidism    Left medial tibial plateau fracture 11/10/2018   Lumbar radiculopathy 09/09/2018   Referred to NS 09/09/2018   Migraine    Mild memory disturbance 11/13/2013   Morbid obesity (HCC) 07/08/2016   Multiple allergies    Obesity    PONV (postoperative nausea and vomiting)    Rash associated with COVID-19 10/02/2020   Restless legs syndrome (RLS) 01/12/2013   Follows with Dr. Anne Hahn who prescribes Requip and gabapentin   RLS (restless legs syndrome)    Scapular dysfunction 04/02/2021   Stress fracture of rib 04/15/2021   Vitamin D deficiency 12/06/2018   Wears glasses 07/08/2016   Allergies  Allergen Reactions   Bee Venom Swelling and Anaphylaxis   Yellow Jacket Venom Swelling   Biaxin [Clarithromycin] Diarrhea and Rash   Robaxin [Methocarbamol] Swelling    Feet, chest pain, headache   Abilify [Aripiprazole] Other (See Comments)    MOOD SWING   Buspar [Buspirone] Diarrhea   Hydrochlorothiazide Rash   Lisinopril Cough   Maxzide [Triamterene-Hctz] Rash   Penicillins Anxiety, Other (See Comments) and Hypertension    Nausea, vomiting, dizziness-possible allergy versus neurological event   Phentermine Other (See Comments)    MOOD CHANGE    Quetiapine Other (See Comments)    Felt funny MOOD SWINGS   Seroquel [Quetiapine Fumarate] Other (See Comments)    MOOD  SWINGS   Past Surgical History:  Procedure Laterality Date   BREAST BIOPSY  6144,3154   x 2 benign lesion   CERVICAL DISCECTOMY  2003   CERVICAL FUSION  2010   x2- C4-C6   Hand fracture surgery     LUMBAR FUSION  2016   TOTAL KNEE ARTHROPLASTY Left 03/31/2022   Procedure: LEFT TOTAL KNEE ARTHROPLASTY;  Surgeon: Marcene Corning, MD;  Location: [facility];  Service: Orthopedics;  Laterality: Left;   VAGINAL HYSTERECTOMY  2009   Family History  Problem Relation Age of Onset   Hypertension Father    Diabetes Father    Heart disease Father    Hearing loss Father    Diabetes Brother    Liver cancer Mother    COPD Mother    Mental illness Mother  Diabetes Mother    Hearing loss Mother    Heart disease Mother    Arthritis/Rheumatoid Mother    Social History   Social History Narrative   Patient is married Gery Pray)  2 children Molly Maduro and [place])   Patient is right handed.   Patient has a college education. 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    Patient drinks 1 cups daily. Uses herbal remedies, takes a daily vitamin.   Wears her seatbelt, smoke detector in the home.    Allergies as of 11/18/2022       Reactions   Bee Venom Swelling, Anaphylaxis   Yellow Jacket Venom Swelling   Biaxin [clarithromycin] Diarrhea, Rash   Robaxin [methocarbamol] Swelling   Feet, chest pain, headache   Abilify [aripiprazole] Other (See Comments)   MOOD SWING   Buspar [buspirone] Diarrhea   Hydrochlorothiazide Rash   Lisinopril Cough   Maxzide [triamterene-hctz] Rash   Penicillins Anxiety, Other (See Comments), Hypertension   Nausea, vomiting, dizziness-possible allergy versus neurological event   Phentermine Other (See Comments)   MOOD CHANGE   Quetiapine Other (See Comments)   Felt funny MOOD SWINGS   Seroquel [quetiapine Fumarate] Other (See Comments)   MOOD SWINGS        Medication List        Accurate as of November 18, 2022  2:57 PM. If you have any questions, ask your nurse or doctor.           albuterol 108 (90 Base) MCG/ACT inhaler Commonly known as: VENTOLIN HFA Inhale 2 puffs into the lungs every 6 (six) hours as needed for wheezing or shortness of breath.   B-12 PO Take 1 drop by mouth daily.   buPROPion 300 MG 24 hr tablet Commonly known as: WELLBUTRIN XL Take 1 tablet (300 mg total) by mouth daily.   DULoxetine 60 MG capsule Commonly known as: CYMBALTA Take 1 capsule (60 mg total) by mouth daily.   Eliquis 5 MG Tabs tablet Generic drug: apixaban TAKE 1 TABLET BY MOUTH TWICE A DAY What changed: how much to take   levothyroxine 150 MCG tablet Commonly known as: SYNTHROID Take 1 tablet (150 mcg total) by mouth daily.   magnesium oxide 400 MG tablet Commonly known as: MAG-OX Take 1 tablet (400 mg total) by mouth daily.   metoprolol succinate 25 MG 24 hr tablet Commonly known as: TOPROL-XL Take 0.5 tablets (12.5 mg total) by mouth daily.   pregabalin 150 MG capsule Commonly known as: Lyrica Take 1 capsule (150 mg total) by mouth 2 (two) times daily.   progesterone 100 MG capsule Commonly known as: PROMETRIUM Take 100 mg by mouth at bedtime.   rOPINIRole 2 MG tablet Commonly known as: REQUIP Take 1/2 tablet at lunch and 1/2 tablet at bed time.   rOPINIRole 4 MG 24 hr tablet Commonly known as: REQUIP XL Take 1 tablet (4 mg total) by mouth daily.   telmisartan 80 MG tablet Commonly known as: Micardis Take 1 tablet (80 mg total) by mouth daily.   Vitamin D 125 MCG (5000 UT) Caps Take 5,000 Units by mouth daily.        All past medical history, surgical history, allergies, family history, immunizations andmedications were updated in the EMR today and reviewed under the history and medication portions of their EMR.      ROS 14 pt review of systems performed and negative (unless mentioned in an HPI)  Objective: BP (!) 144/90   Pulse 84   Temp 98  F (36.7 C)   Wt 243 lb 6.4 oz (110.4 kg)   SpO2 96%   BMI 43.12 kg/m  Physical  Exam Vitals and nursing note reviewed.  Constitutional:      General: She is not in acute distress.    Appearance: Normal appearance. She is not ill-appearing, toxic-appearing or diaphoretic.  HENT:     Head: Normocephalic and atraumatic.  Eyes:     General: No scleral icterus.       Right eye: No discharge.        Left eye: No discharge.     Extraocular Movements: Extraocular movements intact.     Conjunctiva/sclera: Conjunctivae normal.     Pupils: Pupils are equal, round, and reactive to light.  Cardiovascular:     Rate and Rhythm: Normal rate and regular rhythm.  Pulmonary:     Effort: Pulmonary effort is normal. No respiratory distress.     Breath sounds: Normal breath sounds. No wheezing, rhonchi or rales.  Musculoskeletal:     Right lower leg: No edema.     Left lower leg: No edema.  Skin:    General: Skin is warm.     Findings: No rash.  Neurological:     Mental Status: She is alert and oriented to person, place, and time. Mental status is at baseline.     Motor: No weakness.     Gait: Gait normal.  Psychiatric:        Mood and Affect: Mood normal.        Behavior: Behavior normal.        Thought Content: Thought content normal.        Judgment: Judgment normal.      No results found.  Assessment/plan: ZYKERIA GAMEL is a 57 y.o. female present for chronic condition management Hypothyroidism/hair loss Stable Continue levothyroxine 150 mcg QD.  TSH and T4 free collected today   Recurrent major depressive disorder, remission status unspecified (HCC)/arthralgia Stable Continue Cymbalta 60 mg daily  Continue Wellbutrin 300 mg daily  -Requip and horizontal are prescribed by her neurology team.  Vitamin D deficiency: -Patient is taking 5000 units daily.   -Continue current supplementation - vit d collected today   Essential hypertension/morbid obesity/PAF/BMI 45.0-49.9, adult (HCC)/Acquired thrombophilia (HCC)-eliquis Stable Continue Micardis 80 mg   Continue   metoprolol 12.5 mg daily Continue Eliquis prescribed by cardiology Continue  to follow with cardiology Low sodium.   B12 deficiency -Continue supplementing vitamin B12   Return in about 24 weeks (around 05/05/2023).  Orders Placed This Encounter  Procedures   Lipid panel   Meds ordered this encounter  Medications   DULoxetine (CYMBALTA) 60 MG capsule    Sig: Take 1 capsule (60 mg total) by mouth daily.    Dispense:  90 capsule    Refill:  1   metoprolol succinate (TOPROL-XL) 25 MG 24 hr tablet    Sig: Take 0.5 tablets (12.5 mg total) by mouth daily.    Dispense:  45 tablet    Refill:  1   telmisartan (MICARDIS) 80 MG tablet    Sig: Take 1 tablet (80 mg total) by mouth daily.    Dispense:  90 tablet    Refill:  1   buPROPion (WELLBUTRIN XL) 300 MG 24 hr tablet    Sig: Take 1 tablet (300 mg total) by mouth daily.    Dispense:  90 tablet    Refill:  1   pregabalin (LYRICA) 150 MG capsule    Sig:  Take 1 capsule (150 mg total) by mouth 2 (two) times daily.    Dispense:  180 capsule    Refill:  1    DC gabapentin   Referral Orders  No referral(s) requested today     Electronically signed by: Felix Pacini, DO [facility] [place]

## 2022-11-18 NOTE — Patient Instructions (Addendum)
Return in about 24 weeks (around 05/05/2023).        Great to see you today.  I have refilled the medication(s) we provide.   If labs were collected or images ordered, we will inform you of  results once we have received them and reviewed. We will contact you either by echart message, or telephone call.  Please give ample time to the testing facility, and our office to run,  receive and review results. Please do not call inquiring of results, even if you can see them in your chart. We will contact you as soon as we are able. If it has been over 1 week since the test was completed, and you have not yet heard from [place], then please call us.    - echart message- for normal results that have been seen by the patient already.   - telephone call: abnormal results or if patient has not viewed results in their echart.  If a referral to a specialist was entered for you, please call us in 2 weeks if you have not heard from the specialist office to schedule.

## 2022-11-18 NOTE — Telephone Encounter (Signed)
Order cancelled. Referral to neuro surg completed in OV on 11/18/22

## 2022-11-19 DIAGNOSIS — M4322 Fusion of spine, cervical region: Secondary | ICD-10-CM | POA: Insufficient documentation

## 2022-11-19 DIAGNOSIS — M4802 Spinal stenosis, cervical region: Secondary | ICD-10-CM | POA: Insufficient documentation

## 2022-11-22 ENCOUNTER — Other Ambulatory Visit: Payer: Self-pay | Admitting: Family Medicine

## 2022-11-23 ENCOUNTER — Telehealth: Payer: Self-pay | Admitting: Neurology

## 2022-11-23 ENCOUNTER — Encounter: Payer: Self-pay | Admitting: Family Medicine

## 2022-11-23 ENCOUNTER — Other Ambulatory Visit (HOSPITAL_COMMUNITY): Payer: Self-pay

## 2022-11-23 ENCOUNTER — Telehealth: Payer: Self-pay

## 2022-11-23 NOTE — Addendum Note (Signed)
Addended by: Filomena Jungling on: 11/23/2022 02:29 PM   Modules accepted: Orders

## 2022-11-23 NOTE — Telephone Encounter (Signed)
Ok to place referral with same diagnosis codes please

## 2022-11-23 NOTE — Telephone Encounter (Signed)
Pharmacy Patient Advocate Encounter   Received notification from CoverMyMeds that prior authorization for Pregabalin 150mg  is required/requested.   Insurance verification completed.   The patient is insured through [facility] .   Per test claim: PA required; PA submitted to [facility] via CoverMyMeds Key/confirmation #/EOC [id number] Status is pending

## 2022-11-23 NOTE — Telephone Encounter (Signed)
NPSG- Aetna pending uploaded notes.  HST- aetna no auth req.

## 2022-11-24 NOTE — Telephone Encounter (Signed)
Pharmacy Patient Advocate Encounter  Received notification from [facility] that Prior Authorization for Pregabalin 150MG  capsules has been APPROVED from 11-23-2022 to 11-23-2023   PA #/Case ID/Reference #: [id number]

## 2022-11-25 NOTE — Telephone Encounter (Signed)
[employer] denied the NPSG.  HST Aetna no auth req

## 2022-12-11 DIAGNOSIS — M545 Low back pain, unspecified: Secondary | ICD-10-CM | POA: Diagnosis not present

## 2022-12-11 DIAGNOSIS — M5412 Radiculopathy, cervical region: Secondary | ICD-10-CM | POA: Diagnosis not present

## 2022-12-15 ENCOUNTER — Ambulatory Visit (INDEPENDENT_AMBULATORY_CARE_PROVIDER_SITE_OTHER): Payer: 59 | Admitting: Neurology

## 2022-12-15 DIAGNOSIS — G2581 Restless legs syndrome: Secondary | ICD-10-CM

## 2022-12-15 DIAGNOSIS — G4733 Obstructive sleep apnea (adult) (pediatric): Secondary | ICD-10-CM | POA: Diagnosis not present

## 2022-12-15 DIAGNOSIS — R0681 Apnea, not elsewhere classified: Secondary | ICD-10-CM

## 2022-12-15 DIAGNOSIS — R0683 Snoring: Secondary | ICD-10-CM

## 2022-12-15 DIAGNOSIS — G4734 Idiopathic sleep related nonobstructive alveolar hypoventilation: Secondary | ICD-10-CM

## 2022-12-15 DIAGNOSIS — G4731 Primary central sleep apnea: Secondary | ICD-10-CM

## 2022-12-15 DIAGNOSIS — I480 Paroxysmal atrial fibrillation: Secondary | ICD-10-CM

## 2022-12-15 DIAGNOSIS — Z9189 Other specified personal risk factors, not elsewhere classified: Secondary | ICD-10-CM

## 2022-12-15 DIAGNOSIS — G4719 Other hypersomnia: Secondary | ICD-10-CM

## 2022-12-15 DIAGNOSIS — R519 Headache, unspecified: Secondary | ICD-10-CM

## 2022-12-16 NOTE — Progress Notes (Unsigned)
See procedure note.

## 2022-12-17 ENCOUNTER — Telehealth: Payer: Self-pay | Admitting: *Deleted

## 2022-12-17 NOTE — Telephone Encounter (Signed)
-----   Message from Huston Foley sent at 12/17/2022  5:26 PM EDT ----- Urgent set up requested on PAP therapy, due to severe OSA. Patient referred by Dr. Terrace Arabia, seen by me on 11/17/2022, patient had a HST on 12/15/2022.    Please call and notify the patient that the recent home sleep test showed obstructive sleep apnea in the severe range. I recommend treatment for this in the form of autoPAP, which means, that we don't have to bring her in for a sleep study with CPAP, but will let her start using a so called autoPAP machine at home, through a DME company (of her choice, or as per insurance requirement). The DME representative will fit the patient with a mask of choice, educate her on how to use the machine, how to put the mask on, etc. I have placed an order in the chart. Please send the order to a local DME, talk to patient, send report to referring MD. Please also reinforce the need for compliance with treatment. We will need a FU in sleep clinic for 10 weeks post-PAP set up, please arrange that with me or one of our NPs. Thanks,   Huston Foley, MD, PhD [facility])

## 2022-12-17 NOTE — Addendum Note (Signed)
Addended by: Huston Foley on: 12/17/2022 05:26 PM   Modules accepted: Orders

## 2022-12-17 NOTE — Telephone Encounter (Signed)
LVM for pt to call back to review sleep study results

## 2022-12-17 NOTE — Procedures (Signed)
[facility]  HOME SLEEP TEST (Watch PAT) REPORT  STUDY DATE: 12/15/22  DOB: June 14, 1965  MRN: [id number]  ORDERING CLINICIAN: Huston Foley, MD, PhD   REFERRING CLINICIAN: Dr. Levert Feinstein  CLINICAL INFORMATION/HISTORY: 57 year old female with an underlying complex medical history of diabetes, hypertension, hyperlipidemia, restless leg syndrome, arthralgia, hypothyroidism, migraine headaches, vitamin D deficiency, B12 deficiency, history of A-fib, asthma, anxiety, depression, degenerative lumbar disc disease with status post surgery, cervical disc disease with status post surgery, arthritis with status post left knee replacement, and severe obesity with a BMI of over 40, who reports snoring and excessive daytime somnolence.   Epworth sleepiness score: 19/24.  BMI: 42.6 kg/m  FINDINGS:   Sleep Summary:   Total Recording Time (hours, min): 7 hours, 24 min  Total Sleep Time (hours, min):  5 hours, 44 min  Percent REM (%):    11%   Respiratory Indices:   Calculated pAHI (per hour):  85.9/hour         REM pAHI:    101.2/hour       NREM pAHI: 83.9/hour  Central pAHI: 12/hour  Oxygen Saturation Statistics:    Oxygen Saturation (%) Mean: 92%   Minimum oxygen saturation (%):                 78%   O2 Saturation Range (%): 78 - 98%    O2 Saturation (minutes) <=88%: 21.7 min  Pulse Rate Statistics:   Pulse Mean (bpm):    69/min    Pulse Range (49- 102/min)   IMPRESSION: OSA (obstructive sleep apnea), severe Central Sleep Apnea  Nocturnal Hypoxemia  RECOMMENDATION:  This home sleep test demonstrates severe obstructive sleep apnea with a total AHI of 85.9/hour and O2 nadir of 78% with significant time below or at 88% saturation of over 20 minutes, indicating nocturnal hypoxemia.  There was a milder central apnea component as well.  Snoring was detected, in the moderate range for most of the night, at times milder.  Urgent treatment with positive airway  pressure is highly recommended. The patient will be advised to proceed with an autoPAP titration/trial at home. A laboratory attended titration study can be considered in the future for optimization of treatment settings and to improve tolerance and compliance. Alternative treatment options are limited secondary to the severity of the patient's sleep disordered breathing, but may include surgical treatment with an implantable hypoglossal nerve stimulator (in carefully selected candidates, meeting criteria).  Concomitant weight loss is recommended, where clinically appropriate. Please note, that untreated obstructive sleep apnea may carry additional perioperative morbidity. Patients with significant obstructive sleep apnea should receive perioperative PAP therapy and the surgeons and particularly the anesthesiologist should be informed of the diagnosis and the severity of the sleep disordered breathing. The patient should be cautioned not to drive, work at heights, or operate dangerous or heavy equipment when tired or sleepy. Review and reiteration of good sleep hygiene measures should be pursued with any patient. Other causes of the patient's symptoms, including circadian rhythm disturbances, an underlying mood disorder, medication effect and/or an underlying medical problem cannot be ruled out based on this test. Clinical correlation is recommended.  The patient and her referring provider will be notified of the test results. The patient will be seen in follow up in sleep clinic at [facility].  I certify that I have reviewed the raw data recording prior to the issuance of this report in accordance with the standards of the American Academy of Sleep Medicine (AASM).  INTERPRETING PHYSICIAN:   Huston Foley, MD, PhD Medical Director, [facility]) Diplomat, ABPN (Neurology and Sleep)   [facility] [address], [address] [place], [place]  [address] [phone]

## 2022-12-18 ENCOUNTER — Encounter: Payer: Self-pay | Admitting: Family Medicine

## 2022-12-18 DIAGNOSIS — G4733 Obstructive sleep apnea (adult) (pediatric): Secondary | ICD-10-CM | POA: Insufficient documentation

## 2022-12-21 ENCOUNTER — Telehealth: Payer: Self-pay | Admitting: *Deleted

## 2022-12-21 NOTE — Telephone Encounter (Signed)
I called pt and gave her the results of sleep study Severe OSA.  Recommend autopap.  DME [facility].  Use 4 hours every night or more if can.  See in 31-89 days for insurance compliance appt.  She verbalized understanding.  [facility] to call her with insurance authorization, then  proceed with machine and appt.  She verbalized understanding.

## 2022-12-21 NOTE — Telephone Encounter (Signed)
LMVM for pt to return call.   

## 2022-12-21 NOTE — Telephone Encounter (Signed)
Pt returned call but had to leave a VM. Please call back when available.

## 2022-12-21 NOTE — Telephone Encounter (Signed)
-----   Message from Huston Foley sent at 12/17/2022  5:26 PM EDT ----- Urgent set up requested on PAP therapy, due to severe OSA. Patient referred by Dr. Terrace Arabia, seen by me on 11/17/2022, patient had a HST on 12/15/2022.    Please call and notify the patient that the recent home sleep test showed obstructive sleep apnea in the severe range. I recommend treatment for this in the form of autoPAP, which means, that we don't have to bring her in for a sleep study with CPAP, but will let her start using a so called autoPAP machine at home, through a DME company (of her choice, or as per insurance requirement). The DME representative will fit the patient with a mask of choice, educate her on how to use the machine, how to put the mask on, etc. I have placed an order in the chart. Please send the order to a local DME, talk to patient, send report to referring MD. Please also reinforce the need for compliance with treatment. We will need a FU in sleep clinic for 10 weeks post-PAP set up, please arrange that with me or one of our NPs. Thanks,   Huston Foley, MD, PhD [facility])

## 2022-12-21 NOTE — Telephone Encounter (Signed)
See other message

## 2022-12-22 NOTE — Telephone Encounter (Signed)
Zott, Hennie Duos, RN; Elsie Lincoln, [place] Got It Thank You     Previous Messages    ----- Message ----- From: Guy Begin, RN Sent: 12/21/2022   4:14 PM EDT To: Marlou Porch Zott Subject: new autopap user for severe OSA                New autopap user for severe OSA.   [name] [name] 57 year old female 01-26-1966  [address] [place] [address]   [phone] (H)  Thanks.  Andrey Campanile

## 2022-12-28 DIAGNOSIS — M542 Cervicalgia: Secondary | ICD-10-CM | POA: Diagnosis not present

## 2022-12-28 DIAGNOSIS — M5412 Radiculopathy, cervical region: Secondary | ICD-10-CM | POA: Diagnosis not present

## 2023-01-01 DIAGNOSIS — G4733 Obstructive sleep apnea (adult) (pediatric): Secondary | ICD-10-CM | POA: Diagnosis not present

## 2023-01-04 DIAGNOSIS — M545 Low back pain, unspecified: Secondary | ICD-10-CM | POA: Diagnosis not present

## 2023-01-07 DIAGNOSIS — M5412 Radiculopathy, cervical region: Secondary | ICD-10-CM | POA: Diagnosis not present

## 2023-01-07 DIAGNOSIS — M545 Low back pain, unspecified: Secondary | ICD-10-CM | POA: Diagnosis not present

## 2023-01-12 ENCOUNTER — Telehealth: Payer: Self-pay

## 2023-01-12 ENCOUNTER — Telehealth: Payer: Self-pay | Admitting: Neurology

## 2023-01-12 DIAGNOSIS — M545 Low back pain, unspecified: Secondary | ICD-10-CM | POA: Diagnosis not present

## 2023-01-12 NOTE — Telephone Encounter (Signed)
Called pt and LVM stating that she is needing to schedule her Initial Cpap visit. DME and between dates are in pt's SnapShot.

## 2023-01-12 NOTE — Telephone Encounter (Signed)
error 

## 2023-01-13 ENCOUNTER — Encounter: Payer: 59 | Admitting: Neurology

## 2023-01-15 DIAGNOSIS — M545 Low back pain, unspecified: Secondary | ICD-10-CM | POA: Diagnosis not present

## 2023-01-18 DIAGNOSIS — M5412 Radiculopathy, cervical region: Secondary | ICD-10-CM | POA: Diagnosis not present

## 2023-01-18 DIAGNOSIS — M545 Low back pain, unspecified: Secondary | ICD-10-CM | POA: Diagnosis not present

## 2023-01-19 ENCOUNTER — Telehealth: Payer: Self-pay

## 2023-01-19 ENCOUNTER — Encounter: Payer: Self-pay | Admitting: Cardiology

## 2023-01-19 DIAGNOSIS — M545 Low back pain, unspecified: Secondary | ICD-10-CM | POA: Diagnosis not present

## 2023-01-19 NOTE — Telephone Encounter (Signed)
   [name] Pre-operative Risk Assessment    Request for surgical clearance:  What type of surgery is being performed? C7-T1 ACDF   When is this surgery scheduled? 02/18/2023   What type of clearance is required (medical clearance vs. Pharmacy clearance to hold med vs. Both)? Both  Are there any medications that need to be held prior to surgery and how long?Hold Eliquis 3 days prior   Practice name and name of physician performing surgery? [facility]    What is your office phone number: [phone] Att: [name] [name]     7.   What is your office fax number: [phone]  8.   Anesthesia type (None, local, MAC, general) ? General Anesthesia    [name] [name] [name] [name] 01/19/2023, 5:04 PM  _________________________________________________________________   (provider comments below)

## 2023-01-20 ENCOUNTER — Telehealth: Payer: Self-pay | Admitting: *Deleted

## 2023-01-20 NOTE — Telephone Encounter (Signed)
Patient with diagnosis of atrial fibrillation on Eliquis for anticoagulation.    What type of surgery is being performed? C7-T1 ACDF    When is this surgery scheduled? 02/18/2023    CHA2DS2-VASc Score = 3   This indicates a 3.2% annual risk of stroke. The patient's score is based upon: CHF History: 0 HTN History: 1 Diabetes History: 1 Stroke History: 0 Vascular Disease History: 0 Age Score: 0 Gender Score: 1    CrCl > 100 Platelet count 282  Per office protocol, patient can hold Eliquis for 3 days prior to procedure.   Patient will not need bridging with Lovenox (enoxaparin) around procedure.  **This guidance is not considered finalized until pre-operative APP has relayed final recommendations.**

## 2023-01-20 NOTE — Telephone Encounter (Signed)
   Name: [name] [name]  DOB: 04/26/1965  MRN: [id number]  Primary Cardiologist: Gypsy Balsam, MD Last OV: 07/22/22 Procedure/date: C7-T1 ACDF on 02/18/23  Preoperative team, please contact this patient and set up a phone call appointment for further preoperative risk assessment. Please obtain consent and complete medication review. Thank you for your help.  I confirm that guidance regarding antiplatelet and oral anticoagulation therapy has been completed and, if necessary, noted below.  Per office protocol, patient can hold Eliquis for 3 days prior to procedure.   Patient will not need bridging with Lovenox (enoxaparin) around procedure.  I also confirmed the patient resides in the state of [place]. As per [facility] Medical Board telemedicine laws, the patient must reside in the state in which the provider is licensed.  Rip Harbour, NP 01/20/2023, 12:44 PM [facility]

## 2023-01-20 NOTE — Telephone Encounter (Signed)
Pt has been scheduled tele pre op appt 02/05/23. Med rec and consent are done.     Patient Consent for Virtual Visit        [name] [name] has provided verbal consent on 01/20/2023 for a virtual visit (video or telephone).   CONSENT FOR VIRTUAL VISIT FOR:  [name] [name]  By participating in this virtual visit I agree to the following:  I hereby voluntarily request, consent and authorize [facility] and its employed or contracted physicians, physician assistants, nurse practitioners or other licensed health care professionals (the Practitioner), to provide me with telemedicine health care services (the "Services") as deemed necessary by the treating Practitioner. I acknowledge and consent to receive the Services by the Practitioner via telemedicine. I understand that the telemedicine visit will involve communicating with the Practitioner through live audiovisual communication technology and the disclosure of certain medical information by electronic transmission. I acknowledge that I have been given the opportunity to request an in-person assessment or other available alternative prior to the telemedicine visit and am voluntarily participating in the telemedicine visit.  I understand that I have the right to withhold or withdraw my consent to the use of telemedicine in the course of my care at any time, without affecting my right to future care or treatment, and that the Practitioner or I may terminate the telemedicine visit at any time. I understand that I have the right to inspect all information obtained and/or recorded in the course of the telemedicine visit and may receive copies of available information for a reasonable fee.  I understand that some of the potential risks of receiving the Services via telemedicine include:  Delay or interruption in medical evaluation due to technological equipment failure or disruption; Information transmitted may not be sufficient (e.g. poor  resolution of images) to allow for appropriate medical decision making by the Practitioner; and/or  In rare instances, security protocols could fail, causing a breach of personal health information.  Furthermore, I acknowledge that it is my responsibility to provide information about my medical history, conditions and care that is complete and accurate to the best of my ability. I acknowledge that Practitioner's advice, recommendations, and/or decision may be based on factors not within their control, such as incomplete or inaccurate data provided by me or distortions of diagnostic images or specimens that may result from electronic transmissions. I understand that the practice of medicine is not an exact science and that Practitioner makes no warranties or guarantees regarding treatment outcomes. I acknowledge that a copy of this consent can be made available to me via my patient portal [facility] MyChart), or I can request a printed copy by calling the office of [facility].    I understand that my insurance will be billed for this visit.   I have read or had this consent read to me. I understand the contents of this consent, which adequately explains the benefits and risks of the Services being provided via telemedicine.  I have been provided ample opportunity to ask questions regarding this consent and the Services and have had my questions answered to my satisfaction. I give my informed consent for the services to be provided through the use of telemedicine in my medical care

## 2023-01-20 NOTE — Telephone Encounter (Signed)
Pt has been scheduled tele pre op appt 02/05/23. Med rec and consent are done.

## 2023-01-24 ENCOUNTER — Other Ambulatory Visit: Payer: Self-pay | Admitting: Family Medicine

## 2023-01-31 DIAGNOSIS — G4733 Obstructive sleep apnea (adult) (pediatric): Secondary | ICD-10-CM | POA: Diagnosis not present

## 2023-02-02 DIAGNOSIS — M545 Low back pain, unspecified: Secondary | ICD-10-CM | POA: Diagnosis not present

## 2023-02-03 ENCOUNTER — Telehealth: Payer: Self-pay | Admitting: Family Medicine

## 2023-02-03 ENCOUNTER — Emergency Department (HOSPITAL_COMMUNITY)
Admission: EM | Admit: 2023-02-03 | Discharge: 2023-02-03 | Disposition: A | Discharge status: Home / Self Care | Payer: 59 | Attending: Emergency Medicine | Admitting: Emergency Medicine

## 2023-02-03 ENCOUNTER — Encounter (HOSPITAL_COMMUNITY): Payer: Self-pay

## 2023-02-03 ENCOUNTER — Other Ambulatory Visit: Payer: Self-pay

## 2023-02-03 ENCOUNTER — Emergency Department (HOSPITAL_COMMUNITY): Payer: 59

## 2023-02-03 DIAGNOSIS — M25552 Pain in left hip: Secondary | ICD-10-CM | POA: Insufficient documentation

## 2023-02-03 DIAGNOSIS — S39012A Strain of muscle, fascia and tendon of lower back, initial encounter: Secondary | ICD-10-CM | POA: Diagnosis not present

## 2023-02-03 DIAGNOSIS — I4891 Unspecified atrial fibrillation: Secondary | ICD-10-CM | POA: Diagnosis not present

## 2023-02-03 DIAGNOSIS — E039 Hypothyroidism, unspecified: Secondary | ICD-10-CM | POA: Insufficient documentation

## 2023-02-03 DIAGNOSIS — I10 Essential (primary) hypertension: Secondary | ICD-10-CM | POA: Insufficient documentation

## 2023-02-03 DIAGNOSIS — Z79899 Other long term (current) drug therapy: Secondary | ICD-10-CM | POA: Diagnosis not present

## 2023-02-03 DIAGNOSIS — Z7901 Long term (current) use of anticoagulants: Secondary | ICD-10-CM | POA: Diagnosis not present

## 2023-02-03 DIAGNOSIS — T148XXA Other injury of unspecified body region, initial encounter: Secondary | ICD-10-CM

## 2023-02-03 DIAGNOSIS — M1612 Unilateral primary osteoarthritis, left hip: Secondary | ICD-10-CM | POA: Diagnosis not present

## 2023-02-03 HISTORY — DX: Restless legs syndrome: G25.81

## 2023-02-03 MED ORDER — MORPHINE SULFATE (PF) 4 MG/ML IV SOLN
4.0000 mg | Freq: Once | INTRAVENOUS | Status: AC
  Administered 2023-02-03: 4 mg via INTRAMUSCULAR
  Filled 2023-02-03: qty 1

## 2023-02-03 MED ORDER — LIDOCAINE 5 % EX PTCH: 1.0000 | MEDICATED_PATCH | CUTANEOUS | 0 refills | Status: DC

## 2023-02-03 MED ORDER — LIDOCAINE 5 % EX PTCH
1.0000 | MEDICATED_PATCH | CUTANEOUS | Status: DC
  Administered 2023-02-03: 1 via TRANSDERMAL
  Filled 2023-02-03: qty 1

## 2023-02-03 MED ORDER — OXYCODONE-ACETAMINOPHEN 5-325 MG PO TABS: 1.0000 | ORAL_TABLET | Freq: Once | ORAL | Status: DC

## 2023-02-03 NOTE — ED Notes (Signed)
PT was pushed in the wheel chair to the restroom and was able to stand and walk around to the toilet to use restroom.

## 2023-02-03 NOTE — Telephone Encounter (Signed)
noted 

## 2023-02-03 NOTE — ED Triage Notes (Addendum)
Pt is coming from home. Physical therapy yesterday and tried to raise knee during the session. She thinks this may have caused her to pull a muscle in her left hip.

## 2023-02-03 NOTE — Telephone Encounter (Signed)
Patient called and reported pain in her left side along with trouble breathing. Patient was offered appointment with a different office and patient declined and wanted to speak with a nurse. Patient was transferred to Triage and was advised to go to ED, patient coordinator called back to document that patient did not confirm if she would go to the ED. The patient reports its only a pulled muscle.

## 2023-02-03 NOTE — Discharge Instructions (Signed)
Please follow-up with your primary care provider regards to recent symptoms and ER visit.  Today your physical exam and x-rays show that you most likely have a muscle strain that may take up to 7 to 10 days to resolve.  In the meantime may take Tylenol every 6 hours as needed for pain along with muscle relaxers.  Please use muscle relaxers as prescribed and do not drive or operate machinery afterwards as these can make you drowsy.  Please use ice and not heat as he will make this worse.  If symptoms change or worsen please return to the ER.

## 2023-02-03 NOTE — Telephone Encounter (Signed)
FYI   Patient husband, Gery Pray [facility]) called to let Dr. Claiborne Billings know he took Niveen to [facility] ED as advised by Triage Nurse, he is with her now.

## 2023-02-03 NOTE — ED Provider Notes (Signed)
[place] EMERGENCY DEPARTMENT AT [facility] Provider Note   CSN: [id number] Arrival date & time: 02/03/23  1028     History  Chief Complaint  Patient presents with   Muscle Pain   Hip Pain    [name] [name] is a 57 y.o. female with hypertension, hypothyroidism, depression, DJD, A-fib on Eliquis, chronic pain syndrome, cervical fusion syndrome, myofascial pain syndrome presented with left hip pain that began yesterday while patient was at PT.  Patient was asked to take her left leg and crosses over her right leg and when she did so there is a pain in her left hip that has not since resolved.  Patient does not want to bear weight due to the pain.  Patient still move her left lower extremity and denies any traumas, fevers, abdominal pain, back pain, pelvic pain.  She has tried 3 muscle relaxers this morning to no relief along with ibuprofen and Norco to no relief.    Home Medications Prior to Admission medications   Medication Sig Start Date End Date Taking? Authorizing Provider  lidocaine (LIDODERM) 5 % Place 1 patch onto the skin daily. Remove & Discard patch within 12 hours or as directed by MD 02/03/23  Yes Myha Arizpe, Beverly Gust, PA-C  albuterol (VENTOLIN HFA) 108 (90 Base) MCG/ACT inhaler Inhale 2 puffs into the lungs every 6 (six) hours as needed for wheezing or shortness of breath. 06/03/22   Kuneff, Renee A, DO  buPROPion (WELLBUTRIN XL) 300 MG 24 hr tablet Take 1 tablet (300 mg total) by mouth daily. 11/18/22   Kuneff, Renee A, DO  Cholecalciferol (VITAMIN D) 125 MCG (5000 UT) CAPS Take 5,000 Units by mouth daily.    [provider]  Cyanocobalamin (B-12 PO) Take 1 drop by mouth daily.    [provider]  DULoxetine (CYMBALTA) 60 MG capsule Take 1 capsule (60 mg total) by mouth daily. 11/18/22   Kuneff, Renee A, DO  ELIQUIS 5 MG TABS tablet TAKE 1 TABLET BY MOUTH TWICE A DAY Patient taking differently: Take 5 mg by mouth 2 (two) times daily. 07/15/22    Georgeanna Lea, MD  levothyroxine (SYNTHROID) 150 MCG tablet Take 1 tablet (150 mcg total) by mouth daily. 06/04/22   Kuneff, Renee A, DO  magnesium oxide (MAG-OX) 400 MG tablet Take 1 tablet (400 mg total) by mouth daily. 06/03/22   Kuneff, Renee A, DO  metoprolol succinate (TOPROL-XL) 25 MG 24 hr tablet Take 0.5 tablets (12.5 mg total) by mouth daily. 11/18/22   Kuneff, Renee A, DO  pregabalin (LYRICA) 150 MG capsule Take 1 capsule (150 mg total) by mouth 2 (two) times daily. 11/18/22   Kuneff, Renee A, DO  progesterone (PROMETRIUM) 100 MG capsule Take 100 mg by mouth at bedtime. 07/27/22   [provider]  rOPINIRole (REQUIP XL) 4 MG 24 hr tablet Take 1 tablet (4 mg total) by mouth daily. 08/26/22   Glean Salvo, NP  rOPINIRole (REQUIP) 2 MG tablet Take 1/2 tablet at lunch and 1/2 tablet at bed time. 08/26/22   Glean Salvo, NP  telmisartan (MICARDIS) 80 MG tablet Take 1 tablet (80 mg total) by mouth daily. 11/18/22   Kuneff, Renee A, DO      Allergies    Bee venom, Yellow jacket venom, Biaxin [clarithromycin], Robaxin [methocarbamol], Abilify [aripiprazole], Buspar [buspirone], Hydrochlorothiazide, Lisinopril, Maxzide [triamterene-hctz], Penicillins, Phentermine, Quetiapine, and Seroquel [quetiapine fumarate]    Review of Systems   Review of Systems  Physical  Exam Updated Vital Signs BP (!) 143/72 (BP Location: Left Arm)   Pulse 65   Resp 16   Ht 5\' 3"  (1.6 m)   Wt 108.9 kg   SpO2 98%   BMI 42.51 kg/m  Physical Exam Vitals reviewed.  Constitutional:      General: She is not in acute distress. Cardiovascular:     Rate and Rhythm: Normal rate.     Pulses: Normal pulses.  Abdominal:     Palpations: Abdomen is soft.     Tenderness: There is no abdominal tenderness. There is no guarding or rebound.  Musculoskeletal:     Comments: Tender to palpation to left gluteus medius muscle Iliac crest are nontender to palpation Pelvic stable No back pain or midline tenderness or  abnormalities 5 out of 5 bilateral hip flexion however range of motion of the left side is limited due to pain, 5/5 bilateral knee extension/flexion, dorsiflexion/plantarflexion Pain not out of proportion Soft compartments  Skin:    General: Skin is warm and dry.     Capillary Refill: Capillary refill takes less than 2 seconds.  Neurological:     Mental Status: She is alert.     Comments: Sensation intact distally  Psychiatric:        Mood and Affect: Mood normal.     ED Results / Procedures / Treatments   Labs (all labs ordered are listed, but only abnormal results are displayed) Labs Reviewed - No data to display  EKG None  Radiology DG Hip Unilat W or Wo Pelvis 2-3 Views Left  Result Date: 02/03/2023 CLINICAL DATA:  Left hip pain. EXAM: DG HIP (WITH OR WITHOUT PELVIS) 2-3V LEFT COMPARISON:  None Available. FINDINGS: No fracture or dislocation. Moderate degenerative change of the left hip with joint space loss, subchondral sclerosis and osteophytosis. No evidence of a avascular necrosis. Limited visualization of the pelvis is normal. Similar moderate degenerative changes suspected within the contralateral right hip, incompletely evaluated. Regional soft tissues appear normal. IMPRESSION: Moderate degenerative change of the left hip. Electronically Signed   By: Simonne Come M.D.   On: 02/03/2023 13:03    Procedures Procedures    Medications Ordered in ED Medications  lidocaine (LIDODERM) 5 % 1 patch (1 patch Transdermal Patch Applied 02/03/23 1126)  oxyCODONE-acetaminophen (PERCOCET/ROXICET) 5-325 MG per tablet 1 tablet (has no administration in time range)  morphine (PF) 4 MG/ML injection 4 mg (4 mg Intramuscular Given 02/03/23 1125)    ED Course/ Medical Decision Making/ A&P                                 Medical Decision Making Amount and/or Complexity of Data Reviewed Radiology: ordered.  Risk Prescription drug management.   [name] [name] 57 y.o. presented today  for left hip pain. Working DDx that I considered at this time includes, but not limited to, contusion, strain/sprain, fracture, dislocation, neurovascular compromise, septic joint, ischemic limb, compartment syndrome.  R/o DDx: contusion, fracture, dislocation, neurovascular compromise, septic joint, ischemic limb, compartment syndrome: These are considered less likely due to history of present illness, physical exam, labs/imaging findings.  Review of prior external notes: 02/02/2023 PT  Unique Tests and My Interpretation:  Left hip x-ray: Moderate arthritis  Social Determinants of Health: none  Discussion with Independent Historian:  Family member  Discussion of Management of Tests: None  Risk: Medium: prescription drug management  Risk Stratification Score: none  Plan: On  exam patient was in no acute distress with stable vitals.  Patient does have tenderness to her left gluteus medius muscle but no iliac bone tenderness or pelvic tenderness.  Patient's pelvis was stable and patient is neuro vastly intact in the distal limbs.  Patient have any midline tenderness or endorse any back pain with this hip pain.  Patient does have history of chronic pain and is on multiple muscle relaxers at home including Norco and so since that I was not helping with 1 round of morphine was given on the lidocaine patch.  Do suspect that she has a muscle strain given the history and lack of trauma but will obtain x-ray of patient's request which is reasonable.  Patient was unwilling to bear with me however did bear weight with the nurse.  X-ray came back unremarkable outside of arthritis however patient states that her pain is still present and that the pain meds have done nothing.  Since patient is able to bear weight with the nurse twice I have low suspicion of an occult fracture not found on hip x-ray however I spoke with the patient and offered a CT of the left hip to make sure there was not an occult fracture  but patient declined at this time she states she wants to go home.  Will give patient 1 dose of Percocet along with prescription of lidocaine patches and encouraged her to follow-up with her primary care provider.  Encouraged patient to continue using her muscle relaxers as prescribed and pain meds at home.  Patient was given return precautions. Patient stable for discharge at this time.  Patient verbalized understanding of plan.  This chart was dictated using voice recognition software.  Despite best efforts to proofread,  errors can occur which can change the documentation meaning.         Final Clinical Impression(s) / ED Diagnoses Final diagnoses:  Muscle strain    Rx / DC Orders ED Discharge Orders          Ordered    lidocaine (LIDODERM) 5 %  Every 24 hours        02/03/23 1413              Remi Deter 02/03/23 1421    Rondel Baton, MD 02/06/23 [phone]

## 2023-02-03 NOTE — ED Notes (Signed)
PT not happy with her care due to still being in pain and when I offered her the medicine due she did not want it and she also would not let me push her out to the car.  She wanted the person with her to push her out.

## 2023-02-03 NOTE — ED Notes (Signed)
PT was able to stand from the wheelchair to allow me to apply lido patch on her buttock area where she is c/o the most pain.  She did stand up.

## 2023-02-03 NOTE — Telephone Encounter (Signed)
LVM to check on pt and again advise to go to ED  [facility] Day - Client TELEPHONE ADVICE RECORD Access[employer]urse Patient Name First: [name] Last: [name] Gender: Female DOB: 27-Apr-1965 Age: 57 Y 7 M 13 D Return Phone Number: [phone] (Primary) Address: City/ State/ Zip: [place]  [address] Client [facility] Day - Client Client Site [facility] - Day Provider Claiborne Billings, [place] Contact Type Call Who Is Calling Patient / Member / Family / Caregiver Call Type Triage / Clinical Caller Name Susanah Krugman Relationship To Patient Self Return Phone Number [phone] (Primary) Chief Complaint BREATHING - shortness of breath or sounds breathless Reason for Call Symptomatic / Request for Health Information Initial Comment Caller states she is having SOB and pain on the left side in the ribs. When in physical therapy yesterday it felt like something got pulled. She was offered an appointment at a different office , the pt declined. Translation No Nurse Assessment Nurse: Elesa Hacker, RN, Nash Dimmer Date/Time Lamount Cohen Time): 02/03/2023 8:31:13 AM Confirm and document reason for call. If symptomatic, describe symptoms. ---Caller states that she was in physical therapy yesterday and is having SOB when breathing. Sx started after PT. Caller states that she was in therapy session and feels like a muscle was pulled in her side. Does the patient have any new or worsening symptoms? ---Yes Will a triage be completed? ---Yes Related visit to physician within the last 2 weeks? ---No Does the PT have any chronic conditions? (i.e. diabetes, asthma, this includes High risk factors for pregnancy, etc.) ---Yes List chronic conditions. ---sees PT , back pain Is this a behavioral health or substance abuse call? ---No Guidelines Guideline Title Affirmed Question Affirmed Notes Nurse Date/Time (Eastern Time) Chest Injury - Bending Lifting  or Twisting SEVERE chest or rib pain (e.g., excruciating, unable to do any normal activities) Deaton, RN, Nash Dimmer 02/03/2023 8:33:14 AM PLEASE NOTE: All timestamps contained within this report are represented as [place] Standard Time. CONFIDENTIALTY NOTICE: This fax transmission is intended only for the addressee. It contains information that is legally privileged, confidential or otherwise protected from use or disclosure. If you are not the intended recipient, you are strictly prohibited from reviewing, disclosing, copying using or disseminating any of this information or taking any action in reliance on or regarding this information. If you have received this fax in error, please notify us immediately by telephone so that we can arrange for its return to [place]. Phone: [phone], Toll-Free: [phone], Fax: [phone] Page: 2 of 2 Call Id: [id number] Disp. Time Lamount Cohen Time) Disposition Final User 02/03/2023 8:28:22 AM Send to Urgent Queue Talmadge Coventry 02/03/2023 8:36:20 AM Go to ED Now Yes Elesa Hacker, RN, Nash Dimmer Final Disposition 02/03/2023 8:36:20 AM Go to ED Now Yes Deaton, RN, Nash Dimmer Disposition Overriden: Go to ED Now (or PCP triage) Override Reason: Patient's symptoms need a higher level of care Caller Disagree/Comply Disagree Caller Understands Yes PreDisposition Did not know what to do Care Advice Given Per Guideline GO TO ED NOW: * You need to be seen in the Emergency Department. * Go to the ED at ___________ Hospital. * Leave now. Drive carefully. CARE ADVICE given per Chest Injury - Bending, Lifting, or Twisting (Adult) guideline. Comments User: Wandra Scot, RN Date/Time Lamount Cohen Time): 02/03/2023 8:38:55 AM Caller advised that she is alone and having severe pain after PT appt yesterday. Advised that she is having SOB, upgraded to have someone take her to the ED, caller states that  she does not want to go to the ED. Caller refused and offered appt and refused the ED at this  time. Caller was once again recommended to call someone to drive her in to be seen. Caller verbalized an understanding, but is still undecided. User: Wandra Scot, RN Date/Time Lamount Cohen Time): 02/03/2023 8:42:02 AM Caller was moaning in pain. Unsure of callers plan, office notified of clients refusal at this time. Spoke with Karena Addison at the office, who will let the MD know. Referrals GO TO FACILITY UNDECIDED

## 2023-02-05 ENCOUNTER — Ambulatory Visit (INDEPENDENT_AMBULATORY_CARE_PROVIDER_SITE_OTHER): Payer: 59 | Admitting: Family Medicine

## 2023-02-05 ENCOUNTER — Encounter: Payer: Self-pay | Admitting: Family Medicine

## 2023-02-05 ENCOUNTER — Ambulatory Visit: Payer: 59 | Attending: Cardiology

## 2023-02-05 VITALS — BP 138/80 | HR 75 | Temp 98.0°F | Wt 255.2 lb

## 2023-02-05 DIAGNOSIS — R29898 Other symptoms and signs involving the musculoskeletal system: Secondary | ICD-10-CM | POA: Diagnosis not present

## 2023-02-05 DIAGNOSIS — S76012A Strain of muscle, fascia and tendon of left hip, initial encounter: Secondary | ICD-10-CM

## 2023-02-05 DIAGNOSIS — Z01810 Encounter for preprocedural cardiovascular examination: Secondary | ICD-10-CM | POA: Diagnosis not present

## 2023-02-05 DIAGNOSIS — M25552 Pain in left hip: Secondary | ICD-10-CM | POA: Diagnosis not present

## 2023-02-05 MED ORDER — METHYLPREDNISOLONE ACETATE 80 MG/ML IJ SUSP
80.0000 mg | Freq: Once | INTRAMUSCULAR | Status: AC
Start: 2023-02-05 — End: 2023-02-05
  Administered 2023-02-05: 80 mg via INTRAMUSCULAR

## 2023-02-05 MED ORDER — PREDNISONE 20 MG PO TABS: ORAL_TABLET | ORAL | 0 refills | Status: DC

## 2023-02-05 MED ORDER — METHYLPREDNISOLONE ACETATE 80 MG/ML IJ SUSP
80.0000 mg | Freq: Once | INTRAMUSCULAR | Status: DC
Start: 2023-02-05 — End: 2023-02-05

## 2023-02-05 NOTE — Progress Notes (Signed)
Virtual Visit via Telephone Note   Because of [name] [name]'s co-morbid illnesses, she is at least at moderate risk for complications without adequate follow up.  This format is felt to be most appropriate for this patient at this time.  The patient did not have access to video technology/had technical difficulties with video requiring transitioning to audio format only (telephone).  All issues noted in this document were discussed and addressed.  No physical exam could be performed with this format.  Please refer to the patient's chart for her consent to telehealth for [facility].  Evaluation Performed:  Preoperative cardiovascular risk assessment _____________   Date:  02/05/2023   Patient ID:  [name] [name], DOB April 30, 1965, MRN [id number] Patient Location:  Home Provider location:   Office  Primary Care Provider:  Natalia Leatherwood, DO Primary Cardiologist:  Gypsy Balsam, MD  Chief Complaint / Patient Profile   57 y.o. y/o female with a h/o paroxysmal atrial fibrillation, hypertension, hyperlipidemia who is pending C7-T1 ACDF  and presents today for telephonic preoperative cardiovascular risk assessment.  History of Present Illness    [name] [name] is a 57 y.o. female who presents via audio/video conferencing for a telehealth visit today.  Pt was last seen in cardiology clinic on 07/22/2022 by Dr. Bing Matter.  At that time [name] [name] was doing well .  The patient is now pending procedure as outlined above. Since her last visit, she remains stable from a cardiac standpoint.  Today she denies chest pain, shortness of breath, lower extremity edema, fatigue, palpitations, melena, hematuria, hemoptysis, diaphoresis, weakness, presyncope, syncope, orthopnea, and PND.   Past Medical History    Past Medical History:  Diagnosis Date   AC joint derangement 03/26/2021   Acute bronchitis due to COVID-19 virus 09/06/2020   Anxiety 01/12/2013   Arthralgia 07/08/2016    Asthma    Atrial fibrillation (HCC) 12/06/2018   B12 deficiency 01/31/2018   Chicken pox    Chronic insomnia 11/24/2016   Clostridium difficile infection 04/02/2014, 02/06/2014   Complex tear of medial meniscus of left knee as current injury 12/01/2018   Complex tear of medial meniscus of left knee as current injury 12/01/2018   Complication of anesthesia    DDD (degenerative disc disease), lumbar 09/09/2018   Depression    Diabetes mellitus without complication (HCC) 12/07/2018   Dyslipidemia    Dysrhythmia    Elevated hemoglobin A1c 07/08/2016   Estrogen deficiency 12/06/2018   Foraminal stenosis of lumbar region 09/09/2018   Hair loss 07/20/2017   History of lumbar fusion 09/09/2018   Hormone replacement therapy (HRT) 07/20/2017   HTN (hypertension)    Hypertension 07/08/2016   Hypokalemia 12/06/2018   Hypothyroidism    Left medial tibial plateau fracture 11/10/2018   Lumbar radiculopathy 09/09/2018   Referred to NS 09/09/2018   Migraine    Mild memory disturbance 11/13/2013   Morbid obesity (HCC) 07/08/2016   Multiple allergies    Obesity    PONV (postoperative nausea and vomiting)    Rash associated with COVID-19 10/02/2020   Restless leg syndrome    Restless legs syndrome (RLS) 01/12/2013   Follows with Dr. Anne Hahn who prescribes Requip and gabapentin   RLS (restless legs syndrome)    Scapular dysfunction 04/02/2021   Stress fracture of rib 04/15/2021   Vitamin D deficiency 12/06/2018   Wears glasses 07/08/2016   Past Surgical History:  Procedure Laterality Date   BREAST BIOPSY  1191,4782   x 2  benign lesion   CERVICAL DISCECTOMY  2003   CERVICAL FUSION  2010   x2- C4-C6   Hand fracture surgery     LUMBAR FUSION  2016   TOTAL KNEE ARTHROPLASTY Left 03/31/2022   Procedure: LEFT TOTAL KNEE ARTHROPLASTY;  Surgeon: Marcene Corning, MD;  Location: [facility];  Service: Orthopedics;  Laterality: Left;   VAGINAL HYSTERECTOMY  2009    Allergies  Allergies  Allergen  Reactions   Bee Venom Swelling and Anaphylaxis   Yellow Jacket Venom Swelling   Biaxin [Clarithromycin] Diarrhea and Rash   Robaxin [Methocarbamol] Swelling    Feet, chest pain, headache   Abilify [Aripiprazole] Other (See Comments)    MOOD SWING   Buspar [Buspirone] Diarrhea   Hydrochlorothiazide Rash   Lisinopril Cough   Maxzide [Triamterene-Hctz] Rash   Penicillins Anxiety, Other (See Comments) and Hypertension    Nausea, vomiting, dizziness-possible allergy versus neurological event   Phentermine Other (See Comments)    MOOD CHANGE    Quetiapine Other (See Comments)    Felt funny MOOD SWINGS   Seroquel [Quetiapine Fumarate] Other (See Comments)    MOOD SWINGS    Home Medications    Prior to Admission medications   Medication Sig Start Date End Date Taking? Authorizing Provider  albuterol (VENTOLIN HFA) 108 (90 Base) MCG/ACT inhaler Inhale 2 puffs into the lungs every 6 (six) hours as needed for wheezing or shortness of breath. 06/03/22   Kuneff, Renee A, DO  buPROPion (WELLBUTRIN XL) 300 MG 24 hr tablet Take 1 tablet (300 mg total) by mouth daily. 11/18/22   Kuneff, Renee A, DO  Cholecalciferol (VITAMIN D) 125 MCG (5000 UT) CAPS Take 5,000 Units by mouth daily.    [provider]  Cyanocobalamin (B-12 PO) Take 1 drop by mouth daily.    [provider]  DULoxetine (CYMBALTA) 60 MG capsule Take 1 capsule (60 mg total) by mouth daily. 11/18/22   Kuneff, Renee A, DO  ELIQUIS 5 MG TABS tablet TAKE 1 TABLET BY MOUTH TWICE A DAY Patient taking differently: Take 5 mg by mouth 2 (two) times daily. 07/15/22   Georgeanna Lea, MD  levothyroxine (SYNTHROID) 150 MCG tablet Take 1 tablet (150 mcg total) by mouth daily. 06/04/22   Kuneff, Renee A, DO  lidocaine (LIDODERM) 5 % Place 1 patch onto the skin daily. Remove & Discard patch within 12 hours or as directed by MD 02/03/23   Netta Corrigan, PA-C  magnesium oxide (MAG-OX) 400 MG tablet Take 1 tablet (400 mg total) by  mouth daily. 06/03/22   Kuneff, Renee A, DO  metoprolol succinate (TOPROL-XL) 25 MG 24 hr tablet Take 0.5 tablets (12.5 mg total) by mouth daily. 11/18/22   Kuneff, Renee A, DO  pregabalin (LYRICA) 150 MG capsule Take 1 capsule (150 mg total) by mouth 2 (two) times daily. 11/18/22   Kuneff, Renee A, DO  progesterone (PROMETRIUM) 100 MG capsule Take 100 mg by mouth at bedtime. 07/27/22   [provider]  rOPINIRole (REQUIP XL) 4 MG 24 hr tablet Take 1 tablet (4 mg total) by mouth daily. 08/26/22   Glean Salvo, NP  rOPINIRole (REQUIP) 2 MG tablet Take 1/2 tablet at lunch and 1/2 tablet at bed time. 08/26/22   Glean Salvo, NP  telmisartan (MICARDIS) 80 MG tablet Take 1 tablet (80 mg total) by mouth daily. 11/18/22   Natalia Leatherwood, DO    Physical Exam    Vital Signs:  [name] [name]  does not have vital signs available for review today.  Given telephonic nature of communication, physical exam is limited. AAOx3. NAD. Normal affect.  Speech and respirations are unlabored.  Accessory Clinical Findings    None  Assessment & Plan    Preoperative Cardiovascular Risk Assessment:C7-T1 ACDF , 02/18/2023 ,[facility]  , fax number: [phone]      Primary Cardiologist: Gypsy Balsam, MD  Chart reviewed as part of pre-operative protocol coverage. Given past medical history and time since last visit, based on ACC/AHA guidelines, [name] [name] would be at acceptable risk for the planned procedure without further cardiovascular testing.   Her RCRI is very low risk, 0.4% risk of major cardiac event.  She is able to complete greater than 4 METS of physical activity.  Patient was advised that if she develops new symptoms prior to surgery to contact our office to arrange a follow-up appointment.  He verbalized understanding.  Per office protocol, patient can hold Eliquis for 3 days prior to procedure.   Patient will not need bridging with Lovenox (enoxaparin) around  procedure.  I will route this recommendation to the requesting party via Epic fax function and remove from pre-op pool.      Time:   Today, I have spent 5 minutes with the patient with telehealth technology discussing medical history, symptoms, and management plan.     Ronney Asters, NP  02/05/2023, 7:25 AM    Prior to patient's phone evaluation I spent greater than 10 minutes reviewing their past medical history and cardiac medications.

## 2023-02-05 NOTE — Addendum Note (Signed)
Addended by: Filomena Jungling on: 02/05/2023 11:19 AM   Modules accepted: Orders

## 2023-02-05 NOTE — Patient Instructions (Signed)

## 2023-02-05 NOTE — Progress Notes (Signed)
[name] [name] , 01/20/1966, 57 y.o., female MRN: [id number] Patient Care Team    Relationship Specialty Notifications Start End  Natalia Leatherwood, DO PCP - General Family Medicine  07/07/16   Georgeanna Lea, MD PCP - Cardiology Cardiology  02/09/22   Ob/Gyn, Nestor Ramp    07/08/16   Charlott Rakes, MD Consulting Physician Gastroenterology  07/08/16   York Spaniel, MD (Inactive) Consulting Physician Neurology  07/08/16   Jones Bales, MD Attending Physician Endocrinology  07/08/16    Comment: Pt established with Ailene Ravel, NP at this location. - Integrative med- thyroid  Letta Kocher, MD Consulting Physician Rehabilitation  03/06/19    Comment: Neurosurgeon-spine and scoliosis specialist    Chief Complaint  Patient presents with   Flank Pain    Left side pain 2 days ago started after PT     Subjective: [name] [name] is a 57 y.o. Pt presents for an OV with complaints of left hip pain of 3 days duration.  Associated symptoms include pain with weightbearing. Hip pain started at physical therapy 3 days ago, when patient crossed her left leg over her right leg.  She experienced pain in her hip during that time that had not resolved the following day.  She was seen in the emergency room 02/03/2023, after trying 3 of her muscle relaxers at home that was not providing relief, along with ibuprofen and Norco.  Review of exam in the ED she was tender to palpation over the left gluteus, otherwise exam normal with full range of motion intact.  X-ray of the hip was completed findings were of no fracture or dislocation.  Moderate degenerative change of the left hip with joint space loss, subchondral sclerosis and osteophytosis.  No evidence of avascular necrosis.  Regional soft tissues appear normal.  Patient was provided with morphine injection of 4 mg.  Oxycodone 5-325 x 1 for home and lidocaine patches were prescribed.  Per ED note, diagnosis was suspected muscle strain.  No fracture  was present on x-ray, but since patient was having such discomfort bearing weight CT hip was offered, and patient had declined. Patient presents today to follow-up on her ED visit and states she was feeling some better yesterday, but today sharp pain has returned in her left gluteal region. She is able to walk to day without assistance, although guarded and slow gait.      11/18/2022   10:56 AM 10/02/2022   10:14 AM 08/07/2022    2:31 PM 07/15/2022   11:06 AM 07/01/2022   11:13 AM  Depression screen PHQ 2/9  Decreased Interest 1 1 1  0 0  Down, Depressed, Hopeless 0 0 1 0 1  PHQ - 2 Score 1 1 2  0 1  Altered sleeping 3 3 2 3 3   Tired, decreased energy 3 3 3 2 1   Change in appetite 3 3 2  0 1  Feeling bad or failure about yourself  0 1 0 0 0  Trouble concentrating 2 3 2 1  0  Moving slowly or fidgety/restless 2 3 1  0 0  Suicidal thoughts 0 0 0 0 0  PHQ-9 Score 14 17 12 6 6   Difficult doing work/chores Somewhat difficult        Allergies  Allergen Reactions   Bee Venom Swelling and Anaphylaxis   Yellow Jacket Venom Swelling   Biaxin [Clarithromycin] Diarrhea and Rash   Robaxin [Methocarbamol] Swelling    Feet, chest pain, headache  Abilify [Aripiprazole] Other (See Comments)    MOOD SWING   Buspar [Buspirone] Diarrhea   Hydrochlorothiazide Rash   Lisinopril Cough   Maxzide [Triamterene-Hctz] Rash   Penicillins Anxiety, Other (See Comments) and Hypertension    Nausea, vomiting, dizziness-possible allergy versus neurological event   Phentermine Other (See Comments)    MOOD CHANGE    Quetiapine Other (See Comments)    Felt funny MOOD SWINGS   Seroquel [Quetiapine Fumarate] Other (See Comments)    MOOD SWINGS   Social History   Social History Narrative   Patient is married [name] [name])  2 children [name] [name] and [name] [name])   Patient is right handed.   Patient has a college education. 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    Patient drinks 1 cups daily. Uses herbal remedies, takes a daily vitamin.    Wears her seatbelt, smoke detector in the home.   Past Medical History:  Diagnosis Date   AC joint derangement 03/26/2021   Acute bronchitis due to COVID-19 virus 09/06/2020   Anxiety 01/12/2013   Arthralgia 07/08/2016   Asthma    Atrial fibrillation (HCC) 12/06/2018   B12 deficiency 01/31/2018   Chicken pox    Chronic insomnia 11/24/2016   Clostridium difficile infection 04/02/2014, 02/06/2014   Complex tear of medial meniscus of left knee as current injury 12/01/2018   Complex tear of medial meniscus of left knee as current injury 12/01/2018   Complication of anesthesia    DDD (degenerative disc disease), lumbar 09/09/2018   Depression    Diabetes mellitus without complication (HCC) 12/07/2018   Dyslipidemia    Dysrhythmia    Elevated hemoglobin A1c 07/08/2016   Estrogen deficiency 12/06/2018   Foraminal stenosis of lumbar region 09/09/2018   Hair loss 07/20/2017   History of lumbar fusion 09/09/2018   Hormone replacement therapy (HRT) 07/20/2017   HTN (hypertension)    Hypertension 07/08/2016   Hypokalemia 12/06/2018   Hypothyroidism    Left medial tibial plateau fracture 11/10/2018   Lumbar radiculopathy 09/09/2018   Referred to NS 09/09/2018   Migraine    Mild memory disturbance 11/13/2013   Morbid obesity (HCC) 07/08/2016   Multiple allergies    Obesity    PONV (postoperative nausea and vomiting)    Rash associated with COVID-19 10/02/2020   Restless leg syndrome    Restless legs syndrome (RLS) 01/12/2013   Follows with Dr. Anne Hahn who prescribes Requip and gabapentin   RLS (restless legs syndrome)    Scapular dysfunction 04/02/2021   Stress fracture of rib 04/15/2021   Vitamin D deficiency 12/06/2018   Wears glasses 07/08/2016   Past Surgical History:  Procedure Laterality Date   BREAST BIOPSY  6578,4696   x 2 benign lesion   CERVICAL DISCECTOMY  2003   CERVICAL FUSION  2010   x2- C4-C6   Hand fracture surgery     LUMBAR FUSION  2016   TOTAL KNEE  ARTHROPLASTY Left 03/31/2022   Procedure: LEFT TOTAL KNEE ARTHROPLASTY;  Surgeon: Marcene Corning, MD;  Location: [facility];  Service: Orthopedics;  Laterality: Left;   VAGINAL HYSTERECTOMY  2009   Family History  Problem Relation Age of Onset   Hypertension Father    Diabetes Father    Heart disease Father    Hearing loss Father    Diabetes Brother    Liver cancer Mother    COPD Mother    Mental illness Mother    Diabetes Mother    Hearing loss Mother    Heart disease Mother  Arthritis/Rheumatoid Mother    Allergies as of 02/05/2023       Reactions   Bee Venom Swelling, Anaphylaxis   Yellow Jacket Venom Swelling   Biaxin [clarithromycin] Diarrhea, Rash   Robaxin [methocarbamol] Swelling   Feet, chest pain, headache   Abilify [aripiprazole] Other (See Comments)   MOOD SWING   Buspar [buspirone] Diarrhea   Hydrochlorothiazide Rash   Lisinopril Cough   Maxzide [triamterene-hctz] Rash   Penicillins Anxiety, Other (See Comments), Hypertension   Nausea, vomiting, dizziness-possible allergy versus neurological event   Phentermine Other (See Comments)   MOOD CHANGE   Quetiapine Other (See Comments)   Felt funny MOOD SWINGS   Seroquel [quetiapine Fumarate] Other (See Comments)   MOOD SWINGS        Medication List        Accurate as of February 05, 2023  8:12 AM. If you have any questions, ask your nurse or doctor.          STOP taking these medications    lidocaine 5 % Commonly known as: Lidoderm Stopped by: Felix Pacini       TAKE these medications    albuterol 108 (90 Base) MCG/ACT inhaler Commonly known as: VENTOLIN HFA Inhale 2 puffs into the lungs every 6 (six) hours as needed for wheezing or shortness of breath.   B-12 PO Take 1 drop by mouth daily.   buPROPion 300 MG 24 hr tablet Commonly known as: WELLBUTRIN XL Take 1 tablet (300 mg total) by mouth daily.   DULoxetine 60 MG capsule Commonly known as: CYMBALTA Take 1 capsule (60 mg total) by  mouth daily.   Eliquis 5 MG Tabs tablet Generic drug: apixaban TAKE 1 TABLET BY MOUTH TWICE A DAY What changed: how much to take   levothyroxine 150 MCG tablet Commonly known as: SYNTHROID Take 1 tablet (150 mcg total) by mouth daily.   magnesium oxide 400 MG tablet Commonly known as: MAG-OX Take 1 tablet (400 mg total) by mouth daily.   metoprolol succinate 25 MG 24 hr tablet Commonly known as: TOPROL-XL Take 0.5 tablets (12.5 mg total) by mouth daily.   predniSONE 20 MG tablet Commonly known as: DELTASONE 60 mg x2d, 40 mg x3d, 20 mg x2d, 10 mg x2d Start taking on: February 06, 2023 Started by: Felix Pacini   pregabalin 150 MG capsule Commonly known as: Lyrica Take 1 capsule (150 mg total) by mouth 2 (two) times daily.   progesterone 100 MG capsule Commonly known as: PROMETRIUM Take 100 mg by mouth at bedtime.   rOPINIRole 2 MG tablet Commonly known as: REQUIP Take 1/2 tablet at lunch and 1/2 tablet at bed time.   rOPINIRole 4 MG 24 hr tablet Commonly known as: REQUIP XL Take 1 tablet (4 mg total) by mouth daily.   telmisartan 80 MG tablet Commonly known as: Micardis Take 1 tablet (80 mg total) by mouth daily.   Vitamin D 125 MCG (5000 UT) Caps Take 5,000 Units by mouth daily.        All past medical history, surgical history, allergies, family history, immunizations andmedications were updated in the EMR today and reviewed under the history and medication portions of their EMR.     ROS Negative, with the exception of above mentioned in HPI   Objective:  BP 138/80   Pulse 75   Temp 98 F (36.7 C)   Wt 255 lb 3.2 oz (115.8 kg)   SpO2 98%   BMI 45.21 kg/m  Body mass  index is 45.21 kg/m. Physical Exam Vitals and nursing note reviewed.  Constitutional:      General: She is not in acute distress.    Appearance: Normal appearance. She is normal weight. She is not ill-appearing or toxic-appearing.  HENT:     Head: Normocephalic and atraumatic.   Eyes:     General: No scleral icterus.       Right eye: No discharge.        Left eye: No discharge.     Extraocular Movements: Extraocular movements intact.     Conjunctiva/sclera: Conjunctivae normal.     Pupils: Pupils are equal, round, and reactive to light.  Musculoskeletal:     Comments: Guarded gait. Mild TTP gluteal region. DROM. NV intact distally.   Skin:    Findings: No rash.  Neurological:     Mental Status: She is alert and oriented to person, place, and time. Mental status is at baseline.     Motor: No weakness.     Coordination: Coordination normal.     Gait: Gait normal.  Psychiatric:        Mood and Affect: Mood normal.        Behavior: Behavior normal.        Thought Content: Thought content normal.        Judgment: Judgment normal.     No results found. No results found. No results found for this or any previous visit (from the past 24 hour(s)).  Assessment/Plan: HILARIE HOILAND is a 57 y.o. female present for OV for  Pain of left hip/Impaired strength of hip muscles/Muscle strain of left gluteal region, initial encounter Heat, rest. Continue muscle relaxer. -xray reassuring.  - OTC lidocaine patch 4% recommended- insurance did not approve prescribed.  - methylPREDNISolone acetate (DEPO-MEDROL) injection 80 mg - predniSONE (DELTASONE) 20 MG tablet; 60 mg x2d, 40 mg x3d, 20 mg x2d, 10 mg x2d  Dispense: 15 tablet; Refill: 0 F/u prn   Reviewed expectations re: course of current medical issues. Discussed self-management of symptoms. Outlined signs and symptoms indicating need for more acute intervention. Patient verbalized understanding and all questions were answered. Patient received an After-Visit Summary.    No orders of the defined types were placed in this encounter.  Meds ordered this encounter  Medications   methylPREDNISolone acetate (DEPO-MEDROL) injection 80 mg   predniSONE (DELTASONE) 20 MG tablet    Sig: 60 mg x2d, 40 mg x3d, 20 mg  x2d, 10 mg x2d    Dispense:  15 tablet    Refill:  0   Referral Orders  No referral(s) requested today     Note is dictated utilizing voice recognition software. Although note has been proof read prior to signing, occasional typographical errors still can be missed. If any questions arise, please do not hesitate to call for verification.   electronically signed by:  Felix Pacini, DO  [facility] - [place]

## 2023-02-09 DIAGNOSIS — M545 Low back pain, unspecified: Secondary | ICD-10-CM | POA: Diagnosis not present

## 2023-02-12 DIAGNOSIS — M545 Low back pain, unspecified: Secondary | ICD-10-CM | POA: Diagnosis not present

## 2023-02-16 DIAGNOSIS — M545 Low back pain, unspecified: Secondary | ICD-10-CM | POA: Diagnosis not present

## 2023-02-22 DIAGNOSIS — M48061 Spinal stenosis, lumbar region without neurogenic claudication: Secondary | ICD-10-CM | POA: Diagnosis not present

## 2023-02-23 ENCOUNTER — Other Ambulatory Visit: Payer: Self-pay | Admitting: Neurology

## 2023-03-01 ENCOUNTER — Telehealth: Payer: Self-pay | Admitting: Neurology

## 2023-03-01 DIAGNOSIS — G4733 Obstructive sleep apnea (adult) (pediatric): Secondary | ICD-10-CM

## 2023-03-01 DIAGNOSIS — G4734 Idiopathic sleep related nonobstructive alveolar hypoventilation: Secondary | ICD-10-CM

## 2023-03-01 DIAGNOSIS — G4731 Primary central sleep apnea: Secondary | ICD-10-CM

## 2023-03-01 NOTE — Telephone Encounter (Signed)
Pt states she will be returning her Cpap machine to [facility] tomorrow 03/02/23 due to an Ins change. She is needing her results and a new order sent to [facility] phone: [phone] asap because she will be without a machine. Can call pt if any questions

## 2023-03-04 NOTE — Telephone Encounter (Signed)
 Per Dr Vickey Huger, ok to sign new order for [facility] on Dr Teofilo Pod behalf. Order has been written and is pending MD signature.

## 2023-03-04 NOTE — Addendum Note (Signed)
 Addended by: Bertram Savin on: 03/04/2023 11:22 AM   Modules accepted: Orders

## 2023-03-05 ENCOUNTER — Other Ambulatory Visit: Payer: Self-pay | Admitting: Neurology

## 2023-03-11 NOTE — Telephone Encounter (Signed)
 Faxed to [facility] [phone].  Confirmation received.  25pgs.

## 2023-03-11 NOTE — Telephone Encounter (Signed)
 I called pt and she will upload her insurance to [place], then will fax to [facility].

## 2023-03-11 NOTE — Telephone Encounter (Signed)
 Pt is asking the order for her cpap to be faxed to [facility] at [phone]

## 2023-03-29 ENCOUNTER — Ambulatory Visit: Payer: 59 | Admitting: Neurology

## 2023-05-04 NOTE — Progress Notes (Unsigned)
 Patient ID: [name] [name], female  DOB: Sep 25, 1965, 58 y.o.   MRN: [id number] Patient Care Team    Relationship Specialty Notifications Start End  Natalia Leatherwood, DO PCP - General Family Medicine  07/07/16   Georgeanna Lea, MD PCP - Cardiology Cardiology  02/09/22   Ob/Gyn, Nestor Ramp    07/08/16   Charlott Rakes, MD Consulting Physician Gastroenterology  07/08/16   York Spaniel, MD (Inactive) Consulting Physician Neurology  07/08/16   Jones Bales, MD Attending Physician Endocrinology  07/08/16    Comment: Pt established with Ailene Ravel, NP at this location. - Integrative med- thyroid  Rainey Pines, MD Referring Physician Neurosurgery  05/05/23     Chief Complaint  Patient presents with   Medical Management of Chronic Issues    6 month follow up    Subjective: [name] [name] is a 58 y.o.  Female  present for [facility] All past medical history, surgical history, allergies, family history, immunizations, medications and social history were updated in the electronic medical record today. All recent labs, ED visits and hospitalizations within the last year were reviewed.  Hypothyroidism due to acquired atrophy of thyroid Pt reports compliance with levo 150 mcg qd on an empty stomach.   Recurrent major depressive disorder, remission status unspecified (HCC) Pt reports compliance with wellbutrin and cymbalta. She feels her condition is well controlled on these meds.   HTN/Paroxysmal atrial fibrillation (HCC)/Acquired thrombophilia (HCC)-eliquis/Morbid obesity (HCC)/dyslipidemia Pt reports compliance  with telmisartan 80 mg qd and metoprolol  12.5 mg daily. Patient denies chest pain, shortness of breath, dizziness or lower extremity edema.  Pt is prescribed eliquis.   Arthralgia, unspecified joint/DDD (degenerative disc disease), lumbar Pt reports Cymbalta is helping.   B12 deficiency/Vitamin D deficiency She is supplemented  RLS: Pt's condition has been managed by  neurology with higher dose requip 4/2. She reports neurology informed her she did not need to return and PCP could take over management. Pt reports she has been on this regimen for years and it is working well for her.   Cervical vertebral fusion/Foraminal stenosis of cervical region/cervical radiculopathy Pt has surgery scheduled March 20th at [facility]- Dr. Sheppard Penton to address her cervical pain. They are planning to wait on lumbar surgery for now.  Prior note: Patient has a history of C4-C5 ACDF fusion.  She has had increasingly more pain associated with her neck with radiculopathy.   She has experienced new onset of some tingling in her fingertips, mostly fourth and fifth digits bilaterally, sometimes also including third digit bilaterally.  Recent MRI dated 07/20/2022 resulted with C2-C3 no significant disc protrusion, foraminal stenosis or canal stenosis C3-C4 posterior disc osteophyte complex with bilateral facet and uncovertebral hypertrophy.  Similar resulting mid to moderate right and mild left foraminal stenosis similar mild canal stenosis. C4-C5 ACDF.  Patent canal and foramina. C5-C6 mild fossa and uncovertebral hypertrophy and mild endplate spurring with similar mild canal stenosis and patent foramina. C6-C7: Mild endplate spurring and left greater than right facet and uncovertebral hypertrophy with mild to moderate left and mild right foraminal stenosis. C7-T1: Anterior listhesis of C7 on T1 with uncovering of the disc superimposed disc bulging and bilateral facet arthropathy.  Mildly progressed moderate bilateral foraminal stenosis with mild to moderate canal stenosis.     05/05/2023   10:30 AM 11/18/2022   10:56 AM 10/02/2022   10:14 AM 08/07/2022    2:31 PM 07/15/2022   11:06 AM  Depression screen PHQ 2/9  Decreased Interest 1 1 1 1  0  Down, Depressed, Hopeless 1 0 0 1 0  PHQ - 2 Score 2 1 1 2  0  Altered sleeping 3 3 3 2 3   Tired, decreased energy 1 3 3 3 2   Change in appetite 3 3 3 2  0   Feeling bad or failure about yourself  2 0 1 0 0  Trouble concentrating 1 2 3 2 1   Moving slowly or fidgety/restless 0 2 3 1  0  Suicidal thoughts 0 0 0 0 0  PHQ-9 Score 12 14 17 12 6   Difficult doing work/chores Somewhat difficult Somewhat difficult         05/05/2023   10:30 AM 11/18/2022   10:57 AM 10/02/2022   10:14 AM 07/01/2022   11:15 AM  GAD 7 : Generalized Anxiety Score  Nervous, Anxious, on Edge 1 1 3 1   Control/stop worrying 1 0 1 0  Worry too much - different things 1 0 1 0  Trouble relaxing 1 2 3 1   Restless 1 2 2 1   Easily annoyed or irritable 1 2 2 1   Afraid - awful might happen 1 0 2 0  Total GAD 7 Score 7 7 14 4   Anxiety Difficulty Somewhat difficult Not difficult at all  Somewhat difficult     Immunization History  Administered Date(s) Administered   Influenza,inj,Quad PF,6+ Mos 01/25/2018, 12/05/2019, 12/12/2021   Influenza,trivalent, recombinat, inj, PF 12/04/2016   Influenza-Unspecified 01/25/2018, 12/05/2019   Tdap 07/15/2021   Zoster Recombinant(Shingrix) 07/15/2021, 12/12/2021    Past Medical History:  Diagnosis Date   AC joint derangement 03/26/2021   Acute bronchitis due to COVID-19 virus 09/06/2020   Anxiety 01/12/2013   Arthralgia 07/08/2016   Asthma    Atrial fibrillation (HCC) 12/06/2018   B12 deficiency 01/31/2018   Chicken pox    Chronic insomnia 11/24/2016   Clostridium difficile infection 04/02/2014, 02/06/2014   Complex tear of medial meniscus of left knee as current injury 12/01/2018   Complex tear of medial meniscus of left knee as current injury 12/01/2018   Complication of anesthesia    DDD (degenerative disc disease), lumbar 09/09/2018   Depression    Diabetes mellitus without complication (HCC) 12/07/2018   Dyslipidemia    Dysrhythmia    Elevated hemoglobin A1c 07/08/2016   Estrogen deficiency 12/06/2018   Foraminal stenosis of lumbar region 09/09/2018   Hair loss 07/20/2017   History of lumbar fusion 09/09/2018   Hormone  replacement therapy (HRT) 07/20/2017   HTN (hypertension)    Hypertension 07/08/2016   Hypokalemia 12/06/2018   Hypothyroidism    Left medial tibial plateau fracture 11/10/2018   Lumbar radiculopathy 09/09/2018   Referred to NS 09/09/2018   Migraine    Mild memory disturbance 11/13/2013   Morbid obesity (HCC) 07/08/2016   Multiple allergies    Obesity    PONV (postoperative nausea and vomiting)    Rash associated with COVID-19 10/02/2020   Restless leg syndrome    Restless legs syndrome (RLS) 01/12/2013   Follows with Dr. Anne Hahn who prescribes Requip and gabapentin   RLS (restless legs syndrome)    Scapular dysfunction 04/02/2021   Stress fracture of rib 04/15/2021   Vitamin D deficiency 12/06/2018   Wears glasses 07/08/2016   Allergies  Allergen Reactions   Bee Venom Swelling and Anaphylaxis   Yellow Jacket Venom Swelling   Biaxin [Clarithromycin] Diarrhea and Rash   Robaxin [Methocarbamol] Swelling    Feet, chest pain, headache   Abilify [Aripiprazole]  Other (See Comments)    MOOD SWING   Buspar [Buspirone] Diarrhea   Hydrochlorothiazide Rash   Lisinopril Cough   Maxzide [Triamterene-Hctz] Rash   Penicillins Anxiety, Other (See Comments) and Hypertension    Nausea, vomiting, dizziness-possible allergy versus neurological event   Phentermine Other (See Comments)    MOOD CHANGE    Quetiapine Other (See Comments)    Felt funny MOOD SWINGS   Seroquel [Quetiapine Fumarate] Other (See Comments)    MOOD SWINGS   Past Surgical History:  Procedure Laterality Date   BREAST BIOPSY  [id number]   x 2 benign lesion   CERVICAL DISCECTOMY  2003   CERVICAL FUSION  2010   x2- C4-C6   Hand fracture surgery     LUMBAR FUSION  2016   TOTAL KNEE ARTHROPLASTY Left 03/31/2022   Procedure: LEFT TOTAL KNEE ARTHROPLASTY;  Surgeon: Marcene Corning, MD;  Location: [facility];  Service: Orthopedics;  Laterality: Left;   VAGINAL HYSTERECTOMY  2009   Family History  Problem Relation Age of  Onset   Hypertension Father    Diabetes Father    Heart disease Father    Hearing loss Father    Diabetes Brother    Liver cancer Mother    COPD Mother    Mental illness Mother    Diabetes Mother    Hearing loss Mother    Heart disease Mother    Arthritis/Rheumatoid Mother    Social History   Social History Narrative   Patient is married Gery Pray)  2 children Molly Maduro and [place])   Patient is right handed.   Patient has a college education. 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    Patient drinks 1 cups daily. Uses herbal remedies, takes a daily vitamin.   Wears her seatbelt, smoke detector in the home.    Allergies as of 05/05/2023       Reactions   Bee Venom Swelling, Anaphylaxis   Yellow Jacket Venom Swelling   Biaxin [clarithromycin] Diarrhea, Rash   Robaxin [methocarbamol] Swelling   Feet, chest pain, headache   Abilify [aripiprazole] Other (See Comments)   MOOD SWING   Buspar [buspirone] Diarrhea   Hydrochlorothiazide Rash   Lisinopril Cough   Maxzide [triamterene-hctz] Rash   Penicillins Anxiety, Other (See Comments), Hypertension   Nausea, vomiting, dizziness-possible allergy versus neurological event   Phentermine Other (See Comments)   MOOD CHANGE   Quetiapine Other (See Comments)   Felt funny MOOD SWINGS   Seroquel [quetiapine Fumarate] Other (See Comments)   MOOD SWINGS        Medication List        Accurate as of May 05, 2023 12:20 PM. If you have any questions, ask your nurse or doctor.          STOP taking these medications    B-12 PO Stopped by: Felix Pacini   predniSONE 20 MG tablet Commonly known as: DELTASONE Stopped by: Felix Pacini   progesterone 100 MG capsule Commonly known as: PROMETRIUM Stopped by: Felix Pacini       TAKE these medications    albuterol 108 (90 Base) MCG/ACT inhaler Commonly known as: VENTOLIN HFA Inhale 2 puffs into the lungs every 6 (six) hours as needed for wheezing or shortness of breath.   buPROPion 300 MG  24 hr tablet Commonly known as: WELLBUTRIN XL Take 1 tablet (300 mg total) by mouth daily.   DULoxetine 60 MG capsule Commonly known as: CYMBALTA Take 1 capsule (60 mg total) by mouth daily.  Eliquis 5 MG Tabs tablet Generic drug: apixaban TAKE 1 TABLET BY MOUTH TWICE A DAY What changed: how much to take   levothyroxine 150 MCG tablet Commonly known as: SYNTHROID Take 1 tablet (150 mcg total) by mouth daily.   magnesium oxide 400 MG tablet Commonly known as: MAG-OX Take 1 tablet (400 mg total) by mouth daily.   metoprolol succinate 25 MG 24 hr tablet Commonly known as: TOPROL-XL Take 1 tablet (25 mg total) by mouth daily. What changed: how much to take Changed by: Felix Pacini   pregabalin 150 MG capsule Commonly known as: Lyrica Take 1 capsule (150 mg total) by mouth 2 (two) times daily.   rOPINIRole 2 MG tablet Commonly known as: REQUIP Take 1 tablet (2 mg total) by mouth at bedtime. What changed:  how much to take how to take this when to take this additional instructions Changed by: Felix Pacini   rOPINIRole 4 MG 24 hr tablet Commonly known as: REQUIP XL Take 1 tablet (4 mg total) by mouth daily. What changed: Another medication with the same name was changed. Make sure you understand how and when to take each. Changed by: Felix Pacini   telmisartan 80 MG tablet Commonly known as: Micardis Take 1 tablet (80 mg total) by mouth daily.   Vitamin D 125 MCG (5000 UT) Caps Take 5,000 Units by mouth daily.        All past medical history, surgical history, allergies, family history, immunizations andmedications were updated in the EMR today and reviewed under the history and medication portions of their EMR.      ROS 14 pt review of systems performed and negative (unless mentioned in an HPI)  Objective: BP 136/86   Pulse 79   Temp 98.7 F (37.1 C)   Resp 20   Ht 5\' 3"  (1.6 m)   Wt 261 lb (118.4 kg)   SpO2 97%   BMI 46.23 kg/m  Physical  Exam Vitals and nursing note reviewed.  Constitutional:      General: She is not in acute distress.    Appearance: Normal appearance. She is not ill-appearing, toxic-appearing or diaphoretic.  HENT:     Head: Normocephalic and atraumatic.  Eyes:     General: No scleral icterus.       Right eye: No discharge.        Left eye: No discharge.     Extraocular Movements: Extraocular movements intact.     Conjunctiva/sclera: Conjunctivae normal.     Pupils: Pupils are equal, round, and reactive to light.  Cardiovascular:     Rate and Rhythm: Normal rate and regular rhythm.  Pulmonary:     Effort: Pulmonary effort is normal. No respiratory distress.     Breath sounds: Normal breath sounds. No wheezing, rhonchi or rales.  Musculoskeletal:     Right lower leg: No edema.     Left lower leg: No edema.  Skin:    General: Skin is warm.     Findings: No rash.  Neurological:     Mental Status: She is alert and oriented to person, place, and time. Mental status is at baseline.     Motor: No weakness.     Gait: Gait normal.  Psychiatric:        Mood and Affect: Mood normal.        Behavior: Behavior normal.        Thought Content: Thought content normal.        Judgment: Judgment normal.  No results found.  Assessment/plan: LATRISH MOGEL is a 58 y.o. female present for chronic condition management Hypothyroidism/hair loss Stable Continue  levothyroxine 150 mcg QD. refills will be provided in appropriate dose based on lab result today TSH collected today Recurrent major depressive disorder, remission status unspecified (HCC)/arthralgia/RLS Stable Continue  Cymbalta 60 mg daily  Continue  Wellbutrin 300 mg daily  -Requip management> neuro> agree to take over management for pt. Continue requip 4mg /2mg   Vitamin D deficiency: -Patient is taking 5000 units daily.   -Continue current supplementation   Essential hypertension/morbid obesity/PAF/BMI 45.0-49.9, adult (HCC)/Acquired  thrombophilia (HCC)-eliquis Above goal Continue  Micardis 80 mg  increase metoprolol 12.5 mg daily> 25 mg for added coverage Continue Eliquis prescribed by cardiology Continue  to follow with cardiology Low sodium.   B12 deficiency -Continue supplementing vitamin B12  Cervical vertebral fusion/Foraminal stenosis of cervical region/cervical radiculopathy Worsening cervical radiculopathy with recent MRI noting C7-T1 progression to moderate bilateral foraminal stenosis and mild to moderate canal stenosis.   Surgery scheduled 05/20/2023  Return in about 24 weeks (around 10/20/2023).  Orders Placed This Encounter  Procedures   TSH   Basic Metabolic Panel (BMET)   B12 and Folate Panel   Vitamin D (25 hydroxy)   CBC   Meds ordered this encounter  Medications   buPROPion (WELLBUTRIN XL) 300 MG 24 hr tablet    Sig: Take 1 tablet (300 mg total) by mouth daily.    Dispense:  90 tablet    Refill:  1   DULoxetine (CYMBALTA) 60 MG capsule    Sig: Take 1 capsule (60 mg total) by mouth daily.    Dispense:  90 capsule    Refill:  1   metoprolol succinate (TOPROL-XL) 25 MG 24 hr tablet    Sig: Take 1 tablet (25 mg total) by mouth daily.    Dispense:  90 tablet    Refill:  1   pregabalin (LYRICA) 150 MG capsule    Sig: Take 1 capsule (150 mg total) by mouth 2 (two) times daily.    Dispense:  180 capsule    Refill:  1   telmisartan (MICARDIS) 80 MG tablet    Sig: Take 1 tablet (80 mg total) by mouth daily.    Dispense:  90 tablet    Refill:  1   magnesium oxide (MAG-OX) 400 MG tablet    Sig: Take 1 tablet (400 mg total) by mouth daily.    Dispense:  90 tablet    Refill:  3   rOPINIRole (REQUIP) 2 MG tablet    Sig: Take 1 tablet (2 mg total) by mouth at bedtime.    Dispense:  90 tablet    Refill:  1   rOPINIRole (REQUIP XL) 4 MG 24 hr tablet    Sig: Take 1 tablet (4 mg total) by mouth daily.    Dispense:  90 tablet    Refill:  1   Referral Orders  No referral(s) requested today       Electronically signed by: Felix Pacini, DO [facility]

## 2023-05-05 ENCOUNTER — Ambulatory Visit (INDEPENDENT_AMBULATORY_CARE_PROVIDER_SITE_OTHER): Payer: 59 | Admitting: Family Medicine

## 2023-05-05 ENCOUNTER — Encounter: Payer: Self-pay | Admitting: Family Medicine

## 2023-05-05 VITALS — BP 136/86 | HR 79 | Temp 98.7°F | Resp 20 | Ht 63.0 in | Wt 261.0 lb

## 2023-05-05 DIAGNOSIS — R7309 Other abnormal glucose: Secondary | ICD-10-CM

## 2023-05-05 DIAGNOSIS — F339 Major depressive disorder, recurrent, unspecified: Secondary | ICD-10-CM | POA: Diagnosis not present

## 2023-05-05 DIAGNOSIS — G2581 Restless legs syndrome: Secondary | ICD-10-CM | POA: Diagnosis not present

## 2023-05-05 DIAGNOSIS — R7989 Other specified abnormal findings of blood chemistry: Secondary | ICD-10-CM

## 2023-05-05 DIAGNOSIS — M2550 Pain in unspecified joint: Secondary | ICD-10-CM

## 2023-05-05 DIAGNOSIS — E559 Vitamin D deficiency, unspecified: Secondary | ICD-10-CM | POA: Diagnosis not present

## 2023-05-05 DIAGNOSIS — E785 Hyperlipidemia, unspecified: Secondary | ICD-10-CM

## 2023-05-05 DIAGNOSIS — E538 Deficiency of other specified B group vitamins: Secondary | ICD-10-CM

## 2023-05-05 DIAGNOSIS — E034 Atrophy of thyroid (acquired): Secondary | ICD-10-CM

## 2023-05-05 DIAGNOSIS — D6869 Other thrombophilia: Secondary | ICD-10-CM

## 2023-05-05 DIAGNOSIS — I10 Essential (primary) hypertension: Secondary | ICD-10-CM

## 2023-05-05 DIAGNOSIS — M4322 Fusion of spine, cervical region: Secondary | ICD-10-CM

## 2023-05-05 DIAGNOSIS — M4802 Spinal stenosis, cervical region: Secondary | ICD-10-CM

## 2023-05-05 DIAGNOSIS — M48061 Spinal stenosis, lumbar region without neurogenic claudication: Secondary | ICD-10-CM

## 2023-05-05 DIAGNOSIS — M5416 Radiculopathy, lumbar region: Secondary | ICD-10-CM

## 2023-05-05 DIAGNOSIS — M51362 Other intervertebral disc degeneration, lumbar region with discogenic back pain and lower extremity pain: Secondary | ICD-10-CM

## 2023-05-05 DIAGNOSIS — I480 Paroxysmal atrial fibrillation: Secondary | ICD-10-CM

## 2023-05-05 DIAGNOSIS — G629 Polyneuropathy, unspecified: Secondary | ICD-10-CM

## 2023-05-05 DIAGNOSIS — G4733 Obstructive sleep apnea (adult) (pediatric): Secondary | ICD-10-CM

## 2023-05-05 LAB — CBC
HCT: 46.7 % — ABNORMAL HIGH (ref 36.0–46.0)
Hemoglobin: 15.5 g/dL — ABNORMAL HIGH (ref 12.0–15.0)
MCHC: 33.2 g/dL (ref 30.0–36.0)
MCV: 93.2 fl (ref 78.0–100.0)
Platelets: 280 10*3/uL (ref 150.0–400.0)
RBC: 5.01 Mil/uL (ref 3.87–5.11)
RDW: 14.7 % (ref 11.5–15.5)
WBC: 7.2 10*3/uL (ref 4.0–10.5)

## 2023-05-05 LAB — BASIC METABOLIC PANEL
BUN: 24 mg/dL — ABNORMAL HIGH (ref 6–23)
CO2: 28 meq/L (ref 19–32)
Calcium: 9.6 mg/dL (ref 8.4–10.5)
Chloride: 105 meq/L (ref 96–112)
Creatinine, Ser: 0.58 mg/dL (ref 0.40–1.20)
GFR: 100.84 mL/min (ref >60.00)
Glucose, Bld: 95 mg/dL (ref 70–99)
Potassium: 4.3 meq/L (ref 3.5–5.1)
Sodium: 140 meq/L (ref 135–145)

## 2023-05-05 LAB — VITAMIN D 25 HYDROXY (VIT D DEFICIENCY, FRACTURES): VITD: 38.57 ng/mL (ref 30.00–100.00)

## 2023-05-05 LAB — B12 AND FOLATE PANEL
Folate: 9 ng/mL (ref >5.9)
Vitamin B-12: 269 pg/mL (ref 211–911)

## 2023-05-05 LAB — TSH: TSH: 4.36 u[IU]/mL (ref 0.35–5.50)

## 2023-05-05 MED ORDER — METOPROLOL SUCCINATE ER 25 MG PO TB24: 25.0000 mg | ORAL_TABLET | Freq: Every day | ORAL | 1 refills | Status: DC

## 2023-05-05 MED ORDER — ROPINIROLE HCL 2 MG PO TABS
2.0000 mg | ORAL_TABLET | Freq: Every day | ORAL | 1 refills | Status: DC
Start: 2023-05-05 — End: 2023-10-13

## 2023-05-05 MED ORDER — MAGNESIUM OXIDE 400 MG PO TABS
1.0000 | ORAL_TABLET | Freq: Every day | ORAL | 3 refills | Status: DC
Start: 2023-05-05 — End: 2023-10-13

## 2023-05-05 MED ORDER — ROPINIROLE HCL ER 4 MG PO TB24: 4.0000 mg | ORAL_TABLET | Freq: Every day | ORAL | 1 refills | Status: DC

## 2023-05-05 MED ORDER — TELMISARTAN 80 MG PO TABS: 80.0000 mg | ORAL_TABLET | Freq: Every day | ORAL | 1 refills | Status: DC

## 2023-05-05 MED ORDER — BUPROPION HCL ER (XL) 300 MG PO TB24: 300.0000 mg | ORAL_TABLET | Freq: Every day | ORAL | 1 refills | Status: DC

## 2023-05-05 MED ORDER — DULOXETINE HCL 60 MG PO CPEP
60.0000 mg | ORAL_CAPSULE | Freq: Every day | ORAL | 1 refills | Status: DC
Start: 2023-05-05 — End: 2023-10-13

## 2023-05-05 MED ORDER — PREGABALIN 150 MG PO CAPS: 150.0000 mg | ORAL_CAPSULE | Freq: Two times a day (BID) | ORAL | 1 refills | Status: DC

## 2023-05-05 NOTE — Patient Instructions (Addendum)
 Return in about 24 weeks (around 10/20/2023).        Great to see you today.  I have refilled the medication(s) we provide.   If labs were collected or images ordered, we will inform you of  results once we have received them and reviewed. We will contact you either by echart message, or telephone call.  Please give ample time to the testing facility, and our office to run,  receive and review results. Please do not call inquiring of results, even if you can see them in your chart. We will contact you as soon as we are able. If it has been over 1 week since the test was completed, and you have not yet heard from [place], then please call us.    - echart message- for normal results that have been seen by the patient already.   - telephone call: abnormal results or if patient has not viewed results in their echart.  If a referral to a specialist was entered for you, please call us in 2 weeks if you have not heard from the specialist office to schedule.

## 2023-05-06 ENCOUNTER — Encounter: Payer: Self-pay | Admitting: Family Medicine

## 2023-05-06 ENCOUNTER — Other Ambulatory Visit: Payer: Self-pay | Admitting: Family Medicine

## 2023-05-06 MED ORDER — LEVOTHYROXINE SODIUM 150 MCG PO TABS
150.0000 ug | ORAL_TABLET | Freq: Every day | ORAL | 3 refills | Status: DC
Start: 2023-05-06 — End: 2023-10-13

## 2023-05-13 ENCOUNTER — Encounter: Payer: Self-pay | Admitting: Neurology

## 2023-05-13 DIAGNOSIS — G4733 Obstructive sleep apnea (adult) (pediatric): Secondary | ICD-10-CM

## 2023-05-13 DIAGNOSIS — Z789 Other specified health status: Secondary | ICD-10-CM

## 2023-05-13 NOTE — Telephone Encounter (Signed)
 While oral appliance treatment for severe obstructive sleep apnea is rarely fully successful, since she does not have a PAP machine currently, I will place a referral to dentistry.  Please encourage patient to work aggressively on weight loss which can also help reduce the severity of her sleep apnea.  I would be happy to see her back as needed, especially if she would like to restart treatment with PAP therapy for which she may need a repeat sleep study.

## 2023-05-17 ENCOUNTER — Telehealth: Payer: Self-pay | Admitting: Neurology

## 2023-05-17 NOTE — Telephone Encounter (Signed)
 Referral for Dentistry fax to [facility]. Phone: [phone], Fax: [phone]

## 2023-07-01 DEATH — deceased

## 2023-09-17 ENCOUNTER — Encounter: Payer: Self-pay | Admitting: Advanced Practice Midwife

## 2023-10-08 ENCOUNTER — Telehealth: Payer: Self-pay | Admitting: Family Medicine

## 2023-10-08 NOTE — Telephone Encounter (Signed)
 Please send chart to the appropriate person to mark patient as deceased as of Jun 06, 2023.  She passed away at [facility].

## 2023-10-20 ENCOUNTER — Ambulatory Visit: Admitting: Family Medicine
